# Patient Record
Sex: Female | Born: 1940 | Race: White | Hispanic: No | Marital: Married | Smoking: Former smoker
Health system: Southern US, Community
[De-identification: ages and names within clinical notes are randomized; demographics above are authoritative.]

## PROBLEM LIST (undated history)

## (undated) DIAGNOSIS — Z87442 Personal history of urinary calculi: Secondary | ICD-10-CM

## (undated) DIAGNOSIS — Z923 Personal history of irradiation: Secondary | ICD-10-CM

## (undated) DIAGNOSIS — K219 Gastro-esophageal reflux disease without esophagitis: Secondary | ICD-10-CM

## (undated) DIAGNOSIS — Z9889 Other specified postprocedural states: Secondary | ICD-10-CM

## (undated) DIAGNOSIS — M1990 Unspecified osteoarthritis, unspecified site: Secondary | ICD-10-CM

## (undated) DIAGNOSIS — D649 Anemia, unspecified: Secondary | ICD-10-CM

## (undated) DIAGNOSIS — E785 Hyperlipidemia, unspecified: Secondary | ICD-10-CM

## (undated) DIAGNOSIS — E039 Hypothyroidism, unspecified: Secondary | ICD-10-CM

## (undated) DIAGNOSIS — J189 Pneumonia, unspecified organism: Secondary | ICD-10-CM

## (undated) DIAGNOSIS — I10 Essential (primary) hypertension: Secondary | ICD-10-CM

## (undated) DIAGNOSIS — Z801 Family history of malignant neoplasm of trachea, bronchus and lung: Secondary | ICD-10-CM

## (undated) DIAGNOSIS — R112 Nausea with vomiting, unspecified: Secondary | ICD-10-CM

## (undated) DIAGNOSIS — I890 Lymphedema, not elsewhere classified: Secondary | ICD-10-CM

## (undated) HISTORY — DX: Family history of malignant neoplasm of trachea, bronchus and lung: Z80.1

## (undated) HISTORY — DX: Hypothyroidism, unspecified: E03.9

## (undated) HISTORY — DX: Hyperlipidemia, unspecified: E78.5

## (undated) HISTORY — DX: Essential (primary) hypertension: I10

## (undated) HISTORY — DX: Personal history of irradiation: Z92.3

## (undated) HISTORY — PX: HIP SURGERY: SHX245

## (undated) HISTORY — PX: OTHER SURGICAL HISTORY: SHX169

## (undated) HISTORY — PX: BACK SURGERY: SHX140

## (undated) HISTORY — DX: Gastro-esophageal reflux disease without esophagitis: K21.9

## (undated) HISTORY — PX: BREAST CYST ASPIRATION: SHX578

## (undated) HISTORY — PX: TOTAL HIP ARTHROPLASTY: SHX124

---

## 1995-12-01 HISTORY — PX: NECK SURGERY: SHX720

## 2001-11-30 HISTORY — PX: KNEE ARTHROSCOPY: SUR90

## 2005-11-30 HISTORY — PX: THYROIDECTOMY: SHX17

## 2021-06-04 ENCOUNTER — Other Ambulatory Visit: Payer: Self-pay

## 2021-06-04 ENCOUNTER — Ambulatory Visit (INDEPENDENT_AMBULATORY_CARE_PROVIDER_SITE_OTHER): Payer: Medicare Other | Admitting: Dermatology

## 2021-06-04 DIAGNOSIS — D1800 Hemangioma unspecified site: Secondary | ICD-10-CM

## 2021-06-04 DIAGNOSIS — L578 Other skin changes due to chronic exposure to nonionizing radiation: Secondary | ICD-10-CM | POA: Diagnosis not present

## 2021-06-04 DIAGNOSIS — Z85828 Personal history of other malignant neoplasm of skin: Secondary | ICD-10-CM | POA: Diagnosis not present

## 2021-06-04 DIAGNOSIS — L821 Other seborrheic keratosis: Secondary | ICD-10-CM

## 2021-06-04 DIAGNOSIS — Z1283 Encounter for screening for malignant neoplasm of skin: Secondary | ICD-10-CM | POA: Diagnosis not present

## 2021-06-04 DIAGNOSIS — C4491 Basal cell carcinoma of skin, unspecified: Secondary | ICD-10-CM

## 2021-06-04 DIAGNOSIS — L570 Actinic keratosis: Secondary | ICD-10-CM

## 2021-06-04 DIAGNOSIS — D485 Neoplasm of uncertain behavior of skin: Secondary | ICD-10-CM | POA: Diagnosis not present

## 2021-06-04 DIAGNOSIS — D229 Melanocytic nevi, unspecified: Secondary | ICD-10-CM

## 2021-06-04 DIAGNOSIS — C44519 Basal cell carcinoma of skin of other part of trunk: Secondary | ICD-10-CM | POA: Diagnosis not present

## 2021-06-04 DIAGNOSIS — L814 Other melanin hyperpigmentation: Secondary | ICD-10-CM

## 2021-06-04 HISTORY — DX: Basal cell carcinoma of skin, unspecified: C44.91

## 2021-06-04 HISTORY — DX: Actinic keratosis: L57.0

## 2021-06-04 NOTE — Progress Notes (Signed)
New Patient Visit  Subjective  Kristin Carlson is a 80 y.o. female who presents for the following: FBSE (Patient here for full body skin exam and skin cancer screening. Patient with hx of Anthem treated in [place]. ).  Will request records from previous dermatologists.   The following portions of the chart were reviewed this encounter and updated as appropriate:   Allergies  Meds  Problems  Med Hx  Surg Hx  Fam Hx      Review of Systems:  No other skin or systemic complaints except as noted in HPI or Assessment and Plan.  Objective  Well appearing patient in no apparent distress; mood and affect are within normal limits.  A full examination was performed including scalp, head, eyes, ears, nose, lips, neck, chest, axillae, abdomen, back, buttocks, bilateral upper extremities, bilateral lower extremities, hands, feet, fingers, toes, fingernails, and toenails. All findings within normal limits unless otherwise noted below.  Right Dorsal Forearm Erythematous thin papules/macules with gritty scale.   Right Suprapubic 1.3cm scaly pink plaque R/o BCC     Right Anteromedial Thigh 0.8cm pink papule R/o BCC       Left nasal ala 0.4cm excoriated pink papule    Assessment & Plan  AK (actinic keratosis) Right Dorsal Forearm  Prior to procedure, discussed risks of blister formation, small wound, skin dyspigmentation, or rare scar following cryotherapy. Recommend Vaseline ointment to treated areas while healing.  Actinic keratoses are precancerous spots that appear secondary to cumulative UV radiation exposure/sun exposure over time. They are chronic with expected duration over 1 year. A portion of actinic keratoses will progress to squamous cell carcinoma of the skin. It is not possible to reliably predict which spots will progress to skin cancer and so treatment is recommended to prevent development of skin cancer.  Recommend daily broad spectrum sunscreen SPF 30+ to  sun-exposed areas, reapply every 2 hours as needed.  Recommend staying in the shade or wearing long sleeves, sun glasses (UVA+UVB protection) and wide brim hats (4-inch brim around the entire circumference of the hat). Call for new or changing lesions.    Destruction of lesion - Right Dorsal Forearm  Destruction method: cryotherapy   Informed consent: discussed and consent obtained   Lesion destroyed using liquid nitrogen: Yes   Cryotherapy cycles:  2 Outcome: patient tolerated procedure well with no complications   Post-procedure details: wound care instructions given    Neoplasm of uncertain behavior of skin (3) Right Suprapubic  Skin / nail biopsy Type of biopsy: tangential   Informed consent: discussed and consent obtained   Timeout: patient name, date of birth, surgical site, and procedure verified   Patient was prepped and draped in usual sterile fashion: Area prepped with isopropyl alcohol. Anesthesia: the lesion was anesthetized in a standard fashion   Anesthetic:  1% lidocaine w/ epinephrine 1-100,000 buffered w/ 8.4% NaHCO3 Instrument used: flexible razor blade   Hemostasis achieved with: aluminum chloride   Outcome: patient tolerated procedure well   Post-procedure details: wound care instructions given   Additional details:  Mupirocin and a bandage applied  Specimen 1 - Surgical pathology Differential Diagnosis: R/o BCC  Check Margins: No 1.3cm scaly pink plaque   Right Anteromedial Thigh  Skin / nail biopsy Type of biopsy: tangential   Informed consent: discussed and consent obtained   Timeout: patient name, date of birth, surgical site, and procedure verified   Patient was prepped and draped in usual sterile fashion: Area prepped with isopropyl alcohol. Anesthesia:  the lesion was anesthetized in a standard fashion   Anesthetic:  1% lidocaine w/ epinephrine 1-100,000 buffered w/ 8.4% NaHCO3 Instrument used: flexible razor blade   Hemostasis achieved with:  aluminum chloride   Outcome: patient tolerated procedure well   Post-procedure details: wound care instructions given   Additional details:  Mupirocin and a bandage applied  Specimen 2 - Surgical pathology Differential Diagnosis: R/o BCC  Check Margins: No 0.8cm pink papule   Left nasal ala  Left Nasal Ala - DDX excoriated nevus vs angiofibroma > BCC Discussed bx, deferred today Vaseline daily and recheck when healed.   Lentigines - Scattered tan macules - Due to sun exposure - Benign-appering, observe - Recommend daily broad spectrum sunscreen SPF 30+ to sun-exposed areas, reapply every 2 hours as needed. - Call for any changes  Seborrheic Keratoses - Stuck-on, waxy, tan-brown papules and/or plaques  - Benign-appearing - Discussed benign etiology and prognosis. - Observe - Call for any changes  Melanocytic Nevi - Tan-brown and/or pink-flesh-colored symmetric macules and papules - Benign appearing on exam today - Observation - Call clinic for new or changing moles - Recommend daily use of broad spectrum spf 30+ sunscreen to sun-exposed areas.   Hemangiomas - Red papules - Discussed benign nature - Observe - Call for any changes  Actinic Damage - Chronic condition, secondary to cumulative UV/sun exposure - diffuse scaly erythematous macules with underlying dyspigmentation - Recommend daily broad spectrum sunscreen SPF 30+ to sun-exposed areas, reapply every 2 hours as needed.  - Staying in the shade or wearing long sleeves, sun glasses (UVA+UVB protection) and wide brim hats (4-inch brim around the entire circumference of the hat) are also recommended for sun protection.  - Call for new or changing lesions.  History of Basal Cell Carcinoma of the Skin - No evidence of recurrence today at right alar crease - Recommend regular full body skin exams - Recommend daily broad spectrum sunscreen SPF 30+ to sun-exposed areas, reapply every 2 hours as needed.  - Call if  any new or changing lesions are noted between office visits  Skin cancer screening performed today.  Return in about 6 months (around 12/05/2021) for TBSE.  Graciella Belton, RMA, am acting as scribe for Forest Gleason, MD .    Documentation: I have reviewed the above documentation for accuracy and completeness, and I agree with the above.  Forest Gleason, MD

## 2021-06-04 NOTE — Patient Instructions (Addendum)
Cryotherapy Aftercare  Wash gently with soap and water everyday.   Apply Vaseline and Band-Aid daily until healed.   Prior to procedure, discussed risks of blister formation, small wound, skin dyspigmentation, or rare scar following cryotherapy. Recommend Vaseline ointment to treated areas while healing.  Wound Care Instructions  Cleanse wound gently with soap and water once a day then pat dry with clean gauze. Apply a thing coat of Petrolatum (petroleum jelly, "Vaseline") over the wound (unless you have an allergy to this). We recommend that you use a new, sterile tube of Vaseline. Do not pick or remove scabs. Do not remove the yellow or white "healing tissue" from the base of the wound.  Cover the wound with fresh, clean, nonstick gauze and secure with paper tape. You may use Band-Aids in place of gauze and tape if the would is small enough, but would recommend trimming much of the tape off as there is often too much. Sometimes Band-Aids can irritate the skin.  You should call the office for your biopsy report after 1 week if you have not already been contacted.  If you experience any problems, such as abnormal amounts of bleeding, swelling, significant bruising, significant pain, or evidence of infection, please call the office immediately.  FOR ADULT SURGERY PATIENTS: If you need something for pain relief you may take 1 extra strength Tylenol (acetaminophen) AND 2 Ibuprofen (200mg  each) together every 4 hours as needed for pain. (do not take these if you are allergic to them or if you have a reason you should not take them.) Typically, you may only need pain medication for 1 to 3 days.   Melanoma ABCDEs  Melanoma is the most dangerous type of skin cancer, and is the leading cause of death from skin disease.  You are more likely to develop melanoma if you: Have light-colored skin, light-colored eyes, or red or blond hair Spend a lot of time in the sun Tan regularly, either outdoors or in a  tanning bed Have had blistering sunburns, especially during childhood Have a close family member who has had a melanoma Have atypical moles or large birthmarks  Early detection of melanoma is key since treatment is typically straightforward and cure rates are extremely high if we catch it early.   The first sign of melanoma is often a change in a mole or a new dark spot.  The ABCDE system is a way of remembering the signs of melanoma.  A for asymmetry:  The two halves do not match. B for border:  The edges of the growth are irregular. C for color:  A mixture of colors are present instead of an even brown color. D for diameter:  Melanomas are usually (but not always) greater than 80mm - the size of a pencil eraser. E for evolution:  The spot keeps changing in size, shape, and color.  Please check your skin once per month between visits. You can use a small mirror in front and a large mirror behind you to keep an eye on the back side or your body.   If you see any new or changing lesions before your next follow-up, please call to schedule a visit.  Please continue daily skin protection including broad spectrum sunscreen SPF 30+ to sun-exposed areas, reapplying every 2 hours as needed when you're outdoors.    If you have any questions or concerns for your doctor, please call our main line at [phone] and press option 4 to reach your doctor's medical  assistant. If no one answers, please leave a voicemail as directed and we will return your call as soon as possible. Messages left after 4 pm will be answered the following business day.   You may also send [place] a message via [place]. We typically respond to MyChart messages within 1-2 business days.  For prescription refills, please ask your pharmacy to contact our office. Our fax number is [phone].  If you have an urgent issue when the clinic is closed that cannot wait until the next business day, you can page your doctor at the number  below.    Please note that while we do our best to be available for urgent issues outside of office hours, we are not available 24/7.   If you have an urgent issue and are unable to reach [place], you may choose to seek medical care at your doctor's office, retail clinic, urgent care center, or emergency room.  If you have a medical emergency, please immediately call 911 or go to the emergency department.  Pager Numbers  - Dr. Nehemiah Massed: [phone]  - Dr. Laurence Ferrari: [phone]  - Dr. Nicole Kindred: [phone]  In the event of inclement weather, please call our main line at [phone] for an update on the status of any delays or closures.  Dermatology Medication Tips: Please keep the boxes that topical medications come in in order to help keep track of the instructions about where and how to use these. Pharmacies typically print the medication instructions only on the boxes and not directly on the medication tubes.   If your medication is too expensive, please contact our office at [phone] option 4 or send [place] a message through [place].   We are unable to tell what your co-pay for medications will be in advance as this is different depending on your insurance coverage. However, we may be able to find a substitute medication at lower cost or fill out paperwork to get insurance to cover a needed medication.   If a prior authorization is required to get your medication covered by your insurance company, please allow [place] 1-2 business days to complete this process.  Drug prices often vary depending on where the prescription is filled and some pharmacies may offer cheaper prices.  The website www.goodrx.com contains coupons for medications through different pharmacies. The prices here do not account for what the cost may be with help from insurance (it may be cheaper with your insurance), but the website can give you the price if you did not use any insurance.  - You can print the associated coupon  and take it with your prescription to the pharmacy.  - You may also stop by our office during regular business hours and pick up a GoodRx coupon card.  - If you need your prescription sent electronically to a different pharmacy, notify our office through [facility] or by phone at [phone] option 4.  Recommend taking Heliocare sun protection supplement daily in sunny weather for additional sun protection. For maximum protection on the sunniest days, you can take up to 2 capsules of regular Heliocare OR take 1 capsule of Heliocare Ultra. For prolonged exposure (such as a full day in the sun), you can repeat your dose of the supplement 4 hours after your first dose. Heliocare can be purchased at [facility] or at [email or web address].    Recommend Niacinamide or Nicotinamide 500mg  twice per day to lower risk of non-melanoma skin cancer by approximately 25%. This is usually available at  [employer].

## 2021-06-12 ENCOUNTER — Encounter: Payer: Self-pay | Admitting: Dermatology

## 2021-06-12 ENCOUNTER — Telehealth: Payer: Self-pay

## 2021-06-12 NOTE — Telephone Encounter (Signed)
-----   Message from Alfonso Patten, MD sent at 06/12/2021 11:09 AM EDT ----- 1. Skin , right suprapubic BASAL CELL CARCINOMA, SUPERFICIAL AND NODULAR PATTERNS, PERIPHERAL AND DEEP MARGINS INVOLVED --> ED&C  2. Skin , right anteromedial thigh LICHENOID ACTINIC KERATOSIS "inflamed precancer spot" --> LN2 in clinic  MAs please call. Thank you!

## 2021-06-16 ENCOUNTER — Telehealth: Payer: Self-pay

## 2021-06-16 NOTE — Telephone Encounter (Signed)
Discussed biopsy results with pt, appt scheduled for EDC of BCC, LN2 of precancer

## 2021-07-29 ENCOUNTER — Other Ambulatory Visit: Payer: Self-pay

## 2021-07-29 ENCOUNTER — Ambulatory Visit (INDEPENDENT_AMBULATORY_CARE_PROVIDER_SITE_OTHER): Payer: Medicare Other | Admitting: Dermatology

## 2021-07-29 ENCOUNTER — Encounter: Payer: Self-pay | Admitting: Dermatology

## 2021-07-29 DIAGNOSIS — L820 Inflamed seborrheic keratosis: Secondary | ICD-10-CM

## 2021-07-29 DIAGNOSIS — Z85828 Personal history of other malignant neoplasm of skin: Secondary | ICD-10-CM

## 2021-07-29 DIAGNOSIS — C44519 Basal cell carcinoma of skin of other part of trunk: Secondary | ICD-10-CM | POA: Diagnosis not present

## 2021-07-29 DIAGNOSIS — C44719 Basal cell carcinoma of skin of left lower limb, including hip: Secondary | ICD-10-CM

## 2021-07-29 DIAGNOSIS — D485 Neoplasm of uncertain behavior of skin: Secondary | ICD-10-CM

## 2021-07-29 DIAGNOSIS — C44712 Basal cell carcinoma of skin of right lower limb, including hip: Secondary | ICD-10-CM

## 2021-07-29 DIAGNOSIS — L570 Actinic keratosis: Secondary | ICD-10-CM | POA: Diagnosis not present

## 2021-07-29 DIAGNOSIS — D492 Neoplasm of unspecified behavior of bone, soft tissue, and skin: Secondary | ICD-10-CM

## 2021-07-29 HISTORY — DX: Personal history of other malignant neoplasm of skin: Z85.828

## 2021-07-29 NOTE — Progress Notes (Signed)
Follow-Up Visit   Subjective  Kristin Carlson is a 80 y.o. female who presents for the following: Skin Cancer (Biopsy proven BCC nodular pattern at the right suprapubic pt here for treatment ), Actinic Keratosis (Biopsy proven Ak at the right anteromedial thigh, pt here for treatment ), and Skin Problem (Pt c/o several irritated skin growths on her body growing and changing ).   The following portions of the chart were reviewed this encounter and updated as appropriate:   Allergies  Meds  Problems  Med Hx  Surg Hx  Fam Hx      Review of Systems:  No other skin or systemic complaints except as noted in HPI or Assessment and Plan.  Objective  Well appearing patient in no apparent distress; mood and affect are within normal limits.  A focused examination was performed including face,left thigh, left shoulder, right posterior neck. Relevant physical exam findings are noted in the Assessment and Plan.  right posterior neck Erythematous keratotic or waxy stuck-on papule or plaque.   left posterior shoulder, right anteromedial thigh (2) Erythematous thin papules/macules with gritty scale.   Left Thigh - Anterior 0.6 cm pink papule        left nasal ala 0.5 cm pink papule with focal depression      right suprapubic Pink pearly papule or plaque with arborizing vessels.    Assessment & Plan  Inflamed seborrheic keratosis right posterior neck  Prior to procedure, discussed risks of blister formation, small wound, skin dyspigmentation, or rare scar following cryotherapy. Recommend Vaseline ointment to treated areas while healing.   Destruction of lesion - right posterior neck Complexity: simple   Destruction method: cryotherapy   Informed consent: discussed and consent obtained   Timeout:  patient name, date of birth, surgical site, and procedure verified Lesion destroyed using liquid nitrogen: Yes   Region frozen until ice ball extended beyond lesion: Yes   Outcome:  patient tolerated procedure well with no complications   Post-procedure details: wound care instructions given    AK (actinic keratosis) (2) left posterior shoulder, right anteromedial thigh  Biopsy proven AK- right anteromedial thigh treated with LN2   AK-Broad-left posterior shoulder treated with LN2 Consider biopsy if not cleared with LN2 at follow-up  Destruction of lesion - left posterior shoulder, right anteromedial thigh Complexity: simple   Destruction method: cryotherapy   Informed consent: discussed and consent obtained   Timeout:  patient name, date of birth, surgical site, and procedure verified Lesion destroyed using liquid nitrogen: Yes   Region frozen until ice ball extended beyond lesion: Yes   Outcome: patient tolerated procedure well with no complications   Post-procedure details: wound care instructions given    Neoplasm of skin (2) Left Thigh - Anterior  Epidermal / dermal shaving  Lesion diameter (cm):  0.6 Informed consent: discussed and consent obtained   Timeout: patient name, date of birth, surgical site, and procedure verified   Procedure prep:  Patient was prepped and draped in usual sterile fashion Prep type:  Isopropyl alcohol Anesthesia: the lesion was anesthetized in a standard fashion   Anesthetic:  1% lidocaine w/ epinephrine 1-100,000 buffered w/ 8.4% NaHCO3 Hemostasis achieved with: pressure, aluminum chloride and electrodesiccation   Outcome: patient tolerated procedure well   Post-procedure details: sterile dressing applied and wound care instructions given   Dressing type: bandage and petrolatum    Destruction of lesion  Destruction method: electrodesiccation and curettage   Informed consent: discussed and consent obtained   Timeout:  patient name, date of birth, surgical site, and procedure verified Anesthesia: the lesion was anesthetized in a standard fashion   Anesthetic:  1% lidocaine w/ epinephrine 1-100,000 buffered w/ 8.4%  NaHCO3 Curettage performed in three different directions: Yes   Electrodesiccation performed over the curetted area: Yes   Curettage cycles:  3 Final wound size (cm):  0.9 Hemostasis achieved with:  electrodesiccation Outcome: patient tolerated procedure well with no complications   Post-procedure details: sterile dressing applied and wound care instructions given   Dressing type: petrolatum    Specimen 1 - Surgical pathology Differential Diagnosis: R/O BCC vs other   Check Margins: No  left nasal ala  Skin / nail biopsy Type of biopsy: tangential   Informed consent: discussed and consent obtained   Timeout: patient name, date of birth, surgical site, and procedure verified   Procedure prep:  Patient was prepped and draped in usual sterile fashion Prep type:  Isopropyl alcohol Anesthesia: the lesion was anesthetized in a standard fashion   Anesthetic:  1% lidocaine w/ epinephrine 1-100,000 buffered w/ 8.4% NaHCO3 Instrument used: flexible razor blade   Hemostasis achieved with: aluminum chloride   Outcome: patient tolerated procedure well   Post-procedure details: wound care instructions given   Additional details:  Petrolatum and a pressure bandage applied  Specimen 2 - Surgical pathology Differential Diagnosis: R/O BCC vs other  Small specimen in container  Check Margins: No  R/O BCC vs other   Basal cell carcinoma (BCC) of skin of right lower extremity including hip right suprapubic  Destruction of lesion  Destruction method: electrodesiccation and curettage   Informed consent: discussed and consent obtained   Timeout:  patient name, date of birth, surgical site, and procedure verified Anesthesia: the lesion was anesthetized in a standard fashion   Anesthetic:  1% lidocaine w/ epinephrine 1-100,000 buffered w/ 8.4% NaHCO3 Curettage performed in three different directions: Yes   Electrodesiccation performed over the curetted area: Yes   Curettage cycles:  3 Final  wound size (cm):  1.3 Hemostasis achieved with:  electrodesiccation Outcome: patient tolerated procedure well with no complications   Post-procedure details: sterile dressing applied and wound care instructions given   Dressing type: petrolatum    Return for TBSE January 26,2023.  I, Marye Round, CMA, am acting as scribe for Forest Gleason, MD .   Documentation: I have reviewed the above documentation for accuracy and completeness, and I agree with the above.  Forest Gleason, MD

## 2021-07-29 NOTE — Patient Instructions (Signed)

## 2021-07-30 ENCOUNTER — Encounter: Payer: Self-pay | Admitting: Dermatology

## 2021-08-05 ENCOUNTER — Telehealth: Payer: Self-pay

## 2021-08-05 NOTE — Telephone Encounter (Signed)
-----   Message from Alfonso Patten, MD sent at 07/31/2021 10:25 AM EDT ----- 1. Skin , left thigh - anterior BASAL CELL CARCINOMA, NODULAR PATTERN, BASE INVOLVED --> Already treated with ED&C  2. Skin , left nasal ala ATYPICAL BASALOID CELLS, SUSPICIOUS FOR BASAL CELL CARCINOMA, SEE DESCRIPTION --> clinically this is consistent with BCC and recommend treating as BCC. Would recommend referral for Mohs surgery. If she does not agree to Mohs surgery, would at a minimum do an [facility] though this is not as a good a treatment especially in this area and may give the cancer an opportunity to grow deeper and create a larger problem later  Dr. Jerilynn Mages called, no answer and voicemail full 07/31/2021.   MAs please call. Thank you!

## 2021-08-05 NOTE — Telephone Encounter (Signed)
Patient advised bx's showed BCC at left thigh, already treated and atypical basaloid cells at left nasal ala. Patient did not want referral for Moh's and was scheduled for [facility] in our office.Cherly Hensen

## 2021-08-26 ENCOUNTER — Other Ambulatory Visit: Payer: Self-pay

## 2021-08-26 ENCOUNTER — Encounter: Payer: Self-pay | Admitting: Emergency Medicine

## 2021-08-26 ENCOUNTER — Ambulatory Visit
Admission: EM | Admit: 2021-08-26 | Discharge: 2021-08-26 | Disposition: A | Discharge status: Home / Self Care | Payer: Medicare Other

## 2021-08-26 DIAGNOSIS — N950 Postmenopausal bleeding: Secondary | ICD-10-CM

## 2021-08-26 NOTE — ED Triage Notes (Signed)
Vaginal bleeding started yesterday.  "Gushed" when patient stood.  Color was dark brown.  Now very light in amount, but is bright area.

## 2021-08-26 NOTE — ED Provider Notes (Signed)
[name]    CSN: [id number] Arrival date & time: 08/26/21  1535      History   Chief Complaint Chief Complaint  Patient presents with   Vaginal Bleeding    HPI Kristin Carlson is a 80 y.o. female.  Patient is postmenopausal.  Has not had a period in years.  Work-up yesterday morning and felt a "gush" discovered she was passing dark, old blood from her vagina.  Over the course of yesterday, the color of the blood changed from dark, old blood to bright red blood.  Patient has had to change a menstrual pad several times today.  Called PCPs office but they were not able to see her today and directed her to come to urgent care.   Vaginal Bleeding Associated symptoms: no abdominal pain, no dysuria, no fever and no nausea    Past Medical History:  Diagnosis Date   Actinic keratosis 06/04/2021   right anteromedial thigh   Basal cell carcinoma 06/04/2021   right suprapubic   Basal cell carcinoma 07/29/2021   left nasal ala. atypical basaloid cells   History of basal cell carcinoma 07/29/2021   left thigh, EDC at time of bx    There are no problems to display for this patient.   Past Surgical History:  Procedure Laterality Date   BACK SURGERY     cataract Bilateral    HIP SURGERY     KNEE ARTHROSCOPY     NECK SURGERY      OB History   No obstetric history on file.      Home Medications    Prior to Admission medications   Medication Sig Start Date End Date Taking? Authorizing Provider  Cholecalciferol 50 MCG (2000 UT) CAPS Take by mouth.    [provider]  estrogen, conjugated,-medroxyprogesterone (PREMPRO) 0.3-1.5 MG tablet Take 1 tablet by mouth daily. 07/31/20 07/31/21  [provider]  levothyroxine (SYNTHROID) 137 MCG tablet Take by mouth. 01/15/21 01/15/22  [provider]  magnesium oxide (MAG-OX) 400 MG tablet Take by mouth.    [provider]  Omega-3 1000 MG CAPS Take by mouth.    [provider]   pantoprazole (PROTONIX) 20 MG tablet Take 1 tablet by mouth daily. 03/06/21   [provider]  propranolol (INDERAL) 20 MG tablet Take by mouth. 04/10/20   [provider]  rosuvastatin (CRESTOR) 5 MG tablet Take 1 tablet by mouth daily. 03/06/21 03/06/22  [provider]  temazepam (RESTORIL) 15 MG capsule TAKE 1 CAPSULE NIGHTLY AS NEEDED FOR SLEEP 01/31/21   [provider]    Family History Family History  Problem Relation Age of Onset   Cancer Father     Social History Social History   Tobacco Use   Smoking status: Never   Smokeless tobacco: Never  Vaping Use   Vaping Use: Never used  Substance Use Topics   Alcohol use: Yes   Drug use: Never     Allergies   Sulfa antibiotics, Codeine, Other, and Penicillins   Review of Systems Review of Systems  Constitutional:  Negative for appetite change, chills and fever.  Gastrointestinal:  Negative for abdominal pain, nausea and vomiting.  Genitourinary:  Positive for vaginal bleeding. Negative for dysuria.    Physical Exam Triage Vital Signs ED Triage Vitals  Enc Vitals Group     BP 08/26/21 1629 (!) 194/83     Pulse Rate 08/26/21 1629 67     Resp 08/26/21 1629 18  Temp 08/26/21 1629 98.8 F (37.1 C)     Temp Source 08/26/21 1629 Oral     SpO2 08/26/21 1629 96 %     Weight --      Height --      Head Circumference --      Peak Flow --      Pain Score 08/26/21 1635 2     Pain Loc --      Pain Edu? --      Excl. in [place]? --    No data found.  Updated Vital Signs BP (!) 194/83 (BP Location: Left Arm)   Pulse 67   Temp 98.8 F (37.1 C) (Oral)   Resp 18   SpO2 96%   Visual Acuity Right Eye Distance:   Left Eye Distance:   Bilateral Distance:    Right Eye Near:   Left Eye Near:    Bilateral Near:     Physical Exam Constitutional:      Appearance: Normal appearance.  Cardiovascular:     Rate and Rhythm: Normal rate and regular rhythm.  Pulmonary:     Effort: Pulmonary  effort is normal.     Breath sounds: Normal breath sounds.  Abdominal:     General: Abdomen is flat. Bowel sounds are normal.     Palpations: Abdomen is soft.     Tenderness: There is no abdominal tenderness.  Genitourinary:    Labia:        Right: No lesion.        Left: No lesion.      Vagina: No signs of injury. Bleeding present. No erythema or prolapsed vaginal walls.     Cervix: Cervical bleeding present.     Uterus: Not tender.      Comments: Able to palpate uterus but unable to determine size or position sufficiently and unable to palpate bilateral ovaries due to abdominal size.  Dark blood visibly passing from cervical os.  No vaginal or cervical injury. Neurological:     Mental Status: She is alert.     UC Treatments / Results  Labs (all labs ordered are listed, but only abnormal results are displayed) Labs Reviewed - No data to display  EKG   Radiology No results found.  Procedures Procedures (including critical care time)  Medications Ordered in UC Medications - No data to display  Initial Impression / Assessment and Plan / UC Course  I have reviewed the triage vital signs and the nursing notes.  Pertinent labs & imaging results that were available during my care of the patient were reviewed by me and considered in my medical decision making (see chart for details).  Abnormal vaginal bleeding in postmenopausal woman.  Patient will need transvaginal ultrasound.  PCP can order or he may prefer she see gynecology first.   Final Clinical Impressions(s) / UC Diagnoses   Final diagnoses:  Abnormal vaginal bleeding in postmenopausal patient   Discharge Instructions   None    ED Prescriptions   None    PDMP not reviewed this encounter.   Carvel Getting, NP 08/26/21 1926

## 2021-08-27 ENCOUNTER — Ambulatory Visit: Payer: Medicare Other | Admitting: Dermatology

## 2021-09-03 ENCOUNTER — Telehealth: Payer: Self-pay

## 2021-09-03 NOTE — Telephone Encounter (Signed)
Returned patient's call on 08/29/21 she no longer wants to have the [facility] on the L nasal ala treated here in the office with ED&C. She prefers to have MOHS procedure instead.

## 2021-09-03 NOTE — Telephone Encounter (Signed)
ERROR

## 2021-09-18 ENCOUNTER — Telehealth: Payer: Self-pay | Admitting: *Deleted

## 2021-09-18 ENCOUNTER — Encounter: Payer: Self-pay | Admitting: Gynecologic Oncology

## 2021-09-18 NOTE — Telephone Encounter (Signed)
Spoke with the patient and scheduled a new patient appt for 10/24 with Dr Berline Lopes at 10:30 am. Patient given the address and phone number for the clinic.

## 2021-09-22 ENCOUNTER — Telehealth: Payer: Self-pay

## 2021-09-22 ENCOUNTER — Other Ambulatory Visit: Payer: Self-pay

## 2021-09-22 ENCOUNTER — Encounter: Payer: Self-pay | Admitting: Gynecologic Oncology

## 2021-09-22 ENCOUNTER — Inpatient Hospital Stay: Payer: Medicare Other

## 2021-09-22 ENCOUNTER — Inpatient Hospital Stay (HOSPITAL_BASED_OUTPATIENT_CLINIC_OR_DEPARTMENT_OTHER): Payer: Medicare Other | Admitting: Gynecologic Oncology

## 2021-09-22 ENCOUNTER — Inpatient Hospital Stay: Payer: Medicare Other | Attending: Gynecologic Oncology | Admitting: Gynecologic Oncology

## 2021-09-22 VITALS — BP 171/80 | HR 66 | Temp 98.4°F | Resp 16 | Ht 63.0 in | Wt 179.0 lb

## 2021-09-22 DIAGNOSIS — E785 Hyperlipidemia, unspecified: Secondary | ICD-10-CM | POA: Diagnosis not present

## 2021-09-22 DIAGNOSIS — E039 Hypothyroidism, unspecified: Secondary | ICD-10-CM | POA: Insufficient documentation

## 2021-09-22 DIAGNOSIS — C541 Malignant neoplasm of endometrium: Secondary | ICD-10-CM

## 2021-09-22 DIAGNOSIS — Z79899 Other long term (current) drug therapy: Secondary | ICD-10-CM | POA: Diagnosis not present

## 2021-09-22 DIAGNOSIS — I10 Essential (primary) hypertension: Secondary | ICD-10-CM | POA: Diagnosis not present

## 2021-09-22 DIAGNOSIS — N3946 Mixed incontinence: Secondary | ICD-10-CM | POA: Insufficient documentation

## 2021-09-22 LAB — BASIC METABOLIC PANEL - CANCER CENTER ONLY
Anion gap: 7 (ref 5–15)
BUN: 19 mg/dL (ref 8–23)
CO2: 28 mmol/L (ref 22–32)
Calcium: 9.5 mg/dL (ref 8.9–10.3)
Chloride: 105 mmol/L (ref 98–111)
Creatinine: 1.01 mg/dL — ABNORMAL HIGH (ref 0.44–1.00)
GFR, Estimated: 57 mL/min — ABNORMAL LOW (ref >60)
Glucose, Bld: 99 mg/dL (ref 70–99)
Potassium: 4.7 mmol/L (ref 3.5–5.1)
Sodium: 140 mmol/L (ref 135–145)

## 2021-09-22 MED ORDER — HYDROMORPHONE HCL 2 MG PO TABS: 2.0000 mg | ORAL_TABLET | Freq: Four times a day (QID) | ORAL | 0 refills | Status: DC | PRN

## 2021-09-22 MED ORDER — SENNOSIDES-DOCUSATE SODIUM 8.6-50 MG PO TABS: 2.0000 | ORAL_TABLET | Freq: Every day | ORAL | 0 refills | Status: DC

## 2021-09-22 MED ORDER — PROMETHAZINE HCL 12.5 MG PO TABS
12.5000 mg | ORAL_TABLET | Freq: Four times a day (QID) | ORAL | 0 refills | Status: DC | PRN
Start: 2021-09-22 — End: 2021-10-28

## 2021-09-22 NOTE — Telephone Encounter (Signed)
Reviewed BMP results with Ms. Barga. Per Joylene John, NP creatinine slightly elevated at 1.01. Patient states "its been a little high before." Instructed patient to stay hydrated especially with upcoming CT scan. BMP results routed to Marian Sorrow, [place]. Patient verbalized understanding. Instructed to call with any questions or concerns.

## 2021-09-22 NOTE — H&P (View-Only) (Signed)
GYNECOLOGIC ONCOLOGY NEW PATIENT CONSULTATION   Patient Name: Kristin Carlson  Patient Age: 80 y.o. Date of Service: 09/22/21 Referring Provider: Dr. 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   We reviewed the nature of endometrial cancer and its recommended surgical staging, including total hysterectomy, bilateral salpingo-oophorectomy, and lymph node assessment. The patient is a suitable candidate for staging via a minimally invasive approach to surgery.  We reviewed that robotic assistance would be used to complete the surgery.   We discussed that most endometrial cancer is detected early, however, we reviewed that adjuvant therapy will likely be recommended based on the patient's biopsy, however, we will defer to final pathology results.    Given her high risk histology, I recommend CT scan preoperatively to rule out metastatic disease.  We reviewed the sentinel lymph node technique. Risks and benefits of sentinel lymph node biopsy was reviewed. We reviewed the technique and ICG dye. The patient DOES NOT have an iodine allergy or known liver dysfunction. We reviewed the false negative rate (0.4%), and that 3% of patients with metastatic disease will not have it detected by SLN biopsy in endometrial cancer. A low risk of allergic reaction to the dye, <0.2% for ICG, has been reported. We also discussed that in the case of failed mapping, which occurs 40% of the time, a bilateral or unilateral lymphadenectomy will be performed at the surgeon's discretion.   Potential benefits of sentinel nodes including a higher detection rate for metastasis due to ultrastaging and potential reduction in operative morbidity. However, there remains uncertainty as to the role for treatment of micrometastatic disease. Further, the benefit of operative morbidity  associated with the SLN technique in endometrial cancer is not yet completely known. In other patient populations (e.g. the cervical cancer population) there has been observed reductions in morbidity with SLN biopsy compared to pelvic lymphadenectomy. Lymphedema, nerve dysfunction and lymphocysts are all potential risks with the SLN technique as with complete lymphadenectomy. Additional risks to the patient include the risk of damage to an internal organ while operating in an altered view (e.g. the black and white image of the robotic fluorescence imaging mode).   We discussed plan for a robotic assisted hysterectomy, bilateral salpingo-oophorectomy, sentinel lymph node evaluation, possible lymph node dissection, possible tumor debulking, possible laparotomy. The risks of surgery were discussed in detail and she understands these to include infection; wound separation; hernia; vaginal cuff separation, injury to adjacent organs such as bowel, bladder, blood vessels, ureters and nerves; bleeding which may require blood transfusion; anesthesia risk; thromboembolic events; possible death; unforeseen complications; possible need for re-exploration; medical complications such as heart attack, stroke, pleural effusion and pneumonia; and, if full lymphadenectomy is performed the risk of lymphedema and lymphocyst. The patient will receive DVT and antibiotic prophylaxis as indicated. She voiced a clear understanding. She had the opportunity to ask questions. Perioperative instructions were reviewed with her. Prescriptions for post-op medications were sent to her pharmacy of choice.  We discussed surgery on 11/3. 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  She stopped Prempro 3 weeks ago and remains asymptomatic.   Patient has significant issues and allergies to codeine (nausea).  She has had Demerol and morphine which also caused nausea but  better tolerated.  She has had recent good results with Dilaudid, especially when  she takes Phenergan before using the narcotic.  A copy of this note was sent to the patient's referring provider.   85 minutes of total time was spent for this patient encounter, including preparation, face-to-face counseling with the patient and coordination of care, and documentation of the encounter.   Jeral Pinch, MD  Division of Gynecologic Oncology  Department of Obstetrics and Gynecology  [facility]  ___________________________________________  Chief Complaint: Chief Complaint  Patient presents with   Endometrial cancer [facility])    History of Present Illness:  Kristin Carlson is a 80 y.o. y.o. female who is seen in consultation at the request of Dr. Willis Modena for an evaluation of high-grade endometrial cancer.  Patient reports being on hormone replacement therapy (Pempro) for about 20 years.  On 9/26, she had postmenopausal bleeding for the first time.  She felt a gush when she got out of bed and bled through the clothes she was wearing as well as her sheets.  She had bleeding that she describes as being similar to a normal menses for her in the past for approximately 7 or 8 days.  Since then, she has had intermittent bleeding with no bleeding at all on some days.  She endorses some mild cramping during this time.  She was evaluated in the [facility] urgent care on 9/27 after being referred there by her PCP.  She ultimately was then referred to GYN and underwent exam with endometrial biopsy.  Biopsy revealed high-grade endometrial cancer.  She reports a good appetite without nausea or emesis.  She has constipation at baseline and known severe diverticulosis.  She describes her stools as looking like ribbons.  She takes fiber which helps her bowel function.  She had previously been on probiotics in the past.  She has both stress and urge urinary incontinence.  She is seen  urogynecology and is about to be started on a new medication for urge urinary incontinence.  She has tried multiple medications without long-term improvement in the past.  Past medical history is notable for hypertension and tremor, for which she is on propanolol.  She has hypothyroidism as well as hyperlipidemia.  She denies any shortness of breath or chest pain when she is up and mobile.  She has not had any abdominal surgeries  She lives in [place] with her husband.  She denies any tobacco or alcohol use.  She is retired.  She enjoys playing the trombone.  PAST MEDICAL HISTORY:  Past Medical History:  Diagnosis Date   Actinic keratosis 06/04/2021   right anteromedial thigh   Basal cell carcinoma 06/04/2021   right suprapubic   Basal cell carcinoma 07/29/2021   left nasal ala. atypical basaloid cells   History of basal cell carcinoma 07/29/2021   left thigh, EDC at time of bx     PAST SURGICAL HISTORY:  Past Surgical History:  Procedure Laterality Date   BACK SURGERY     cataract Bilateral    HIP SURGERY     KNEE ARTHROSCOPY Left 2003   NECK SURGERY  1997   Fusion C3, C4, C5   THYROIDECTOMY  2007   TOTAL HIP ARTHROPLASTY Bilateral    Left Hip -2015, Right Hip 208    OB/GYN HISTORY:  OB History  No obstetric history on file.    No LMP recorded. Patient is postmenopausal.  Age at menarche: 11  Age at menopause: 33 Hx of HRT: Yes, see HPI Hx of STDs: Denies Last pap: 2020 History of  abnormal pap smears: Denies  SCREENING STUDIES:  Last mammogram: 2020  Last colonoscopy: 2018  MEDICATIONS: Outpatient Encounter Medications as of 09/22/2021  Medication Sig   Cholecalciferol 50 MCG (2000 UT) CAPS Take by mouth.   levothyroxine (SYNTHROID) 137 MCG tablet Take by mouth.   magnesium oxide (MAG-OX) 400 MG tablet Take by mouth.   Multiple Vitamins-Minerals (MULTIVITAMIN WOMEN 50+ PO) Take by mouth.   Omega-3 1000 MG CAPS Take 3 capsules by mouth. 3000mg  daily    propranolol (INDERAL) 20 MG tablet Take by mouth 2 (two) times daily.   rosuvastatin (CRESTOR) 5 MG tablet Take 1 tablet by mouth daily.   temazepam (RESTORIL) 15 MG capsule TAKE 1 CAPSULE NIGHTLY AS NEEDED FOR SLEEP   clindamycin (CLEOCIN) 150 MG capsule Take 150 mg by mouth. As needed for dentist (Patient not taking: Reported on 09/18/2021)   estrogen, conjugated,-medroxyprogesterone (PREMPRO) 0.3-1.5 MG tablet Take 1 tablet by mouth daily.   pantoprazole (PROTONIX) 20 MG tablet Take 1 tablet by mouth daily. (Patient not taking: Reported on 09/18/2021)   [DISCONTINUED] propranolol (INDERAL) 40 MG tablet Take 40 mg by mouth 2 (two) times daily.   No facility-administered encounter medications on file as of 09/22/2021.    ALLERGIES:  Allergies  Allergen Reactions   Lactose    Sulfa Antibiotics    Codeine Nausea Only   Other Rash   Penicillins Rash     FAMILY HISTORY:  Family History  Problem Relation Age of Onset   Cancer Father        Lung Cancer   Skin cancer Maternal Grandmother    Colon cancer Neg Hx    Breast cancer Neg Hx    Ovarian cancer Neg Hx    Endometrial cancer Neg Hx    Pancreatic cancer Neg Hx    Prostate cancer Neg Hx      SOCIAL HISTORY:  Social Connections: Not on file    REVIEW OF SYSTEMS:  + vaginal bleeding Denies appetite changes, fevers, chills, fatigue, unexplained weight changes. Denies hearing loss, neck lumps or masses, mouth sores, ringing in ears or voice changes. Denies cough or wheezing.  Denies shortness of breath. Denies chest pain or palpitations. Denies leg swelling. Denies abdominal distention, pain, blood in stools, constipation, diarrhea, nausea, vomiting, or early satiety. Denies pain with intercourse, dysuria, frequency, hematuria or incontinence. Denies hot flashes, pelvic pain.   Denies joint pain, back pain or muscle pain/cramps. Denies itching, rash, or wounds. Denies dizziness, headaches, numbness or seizures. Denies  swollen lymph nodes or glands, denies easy bruising or bleeding. Denies anxiety, depression, confusion, or decreased concentration.  Physical Exam:  Vital Signs for this encounter:  Blood pressure (!) 171/80, pulse 66, temperature 98.4 F (36.9 C), temperature source Oral, resp. rate 16, height 5\' 3"  (1.6 m), weight 179 lb (81.2 kg), SpO2 97 %. Body mass index is 31.71 kg/m. General: Alert, oriented, no acute distress.  HEENT: Normocephalic, atraumatic. Sclera anicteric.  Chest: Clear to auscultation bilaterally. No wheezes, rhonchi, or rales. Cardiovascular: Regular rate and rhythm, no murmurs, rubs, or gallops.  Abdomen: Obese. Normoactive bowel sounds. Soft, nondistended, nontender to palpation. No masses or hepatosplenomegaly appreciated. No palpable fluid wave.  Extremities: Grossly normal range of motion. Warm, well perfused. No edema bilaterally.  Skin: No rashes or lesions.  Lymphatics: No cervical, supraclavicular, or inguinal adenopathy.  GU:  Normal external female genitalia. No lesions. No discharge or bleeding.             Bladder/urethra:  No lesions or masses, well supported bladder             Vagina: Mildly atrophic vaginal mucosa.  No lesions or masses.             Cervix: Normal appearing, no lesions.  Cervix rather flush with the vagina.  Small amount of bleeding noted at the os.  No evidence of cervical involvement grossly.             Uterus:  Small, mobile, no parametrial involvement or nodularity.             Adnexa: No masses appreciated.  LABORATORY AND RADIOLOGIC DATA:  Outside medical records were reviewed to synthesize the above history, along with the history and physical obtained during the visit.   No results found for: WBC, HGB, HCT, PLT, GLUCOSE, CHOL, TRIG, HDL, LDLDIRECT, LDLCALC, ALT, AST, NA, K, CL, CREATININE, BUN, CO2, TSH, PSA, INR, GLUF, HGBA1C, MICROALBUR  Endometrial biopsy on 09/11/2021: Poorly differentiated carcinoma.  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

## 2021-09-22 NOTE — Progress Notes (Signed)
GYNECOLOGIC ONCOLOGY NEW PATIENT CONSULTATION   Patient Name: Kristin Carlson  Patient Age: 80 y.o. Date of Service: 09/22/21 Referring Provider: Dr. 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   We reviewed the nature of endometrial cancer and its recommended surgical staging, including total hysterectomy, bilateral salpingo-oophorectomy, and lymph node assessment. The patient is a suitable candidate for staging via a minimally invasive approach to surgery.  We reviewed that robotic assistance would be used to complete the surgery.   We discussed that most endometrial cancer is detected early, however, we reviewed that adjuvant therapy will likely be recommended based on the patient's biopsy, however, we will defer to final pathology results.    Given her high risk histology, I recommend CT scan preoperatively to rule out metastatic disease.  We reviewed the sentinel lymph node technique. Risks and benefits of sentinel lymph node biopsy was reviewed. We reviewed the technique and ICG dye. The patient DOES NOT have an iodine allergy or known liver dysfunction. We reviewed the false negative rate (0.4%), and that 3% of patients with metastatic disease will not have it detected by SLN biopsy in endometrial cancer. A low risk of allergic reaction to the dye, <0.2% for ICG, has been reported. We also discussed that in the case of failed mapping, which occurs 40% of the time, a bilateral or unilateral lymphadenectomy will be performed at the surgeon's discretion.   Potential benefits of sentinel nodes including a higher detection rate for metastasis due to ultrastaging and potential reduction in operative morbidity. However, there remains uncertainty as to the role for treatment of micrometastatic disease. Further, the benefit of operative morbidity  associated with the SLN technique in endometrial cancer is not yet completely known. In other patient populations (e.g. the cervical cancer population) there has been observed reductions in morbidity with SLN biopsy compared to pelvic lymphadenectomy. Lymphedema, nerve dysfunction and lymphocysts are all potential risks with the SLN technique as with complete lymphadenectomy. Additional risks to the patient include the risk of damage to an internal organ while operating in an altered view (e.g. the black and white image of the robotic fluorescence imaging mode).   We discussed plan for a robotic assisted hysterectomy, bilateral salpingo-oophorectomy, sentinel lymph node evaluation, possible lymph node dissection, possible tumor debulking, possible laparotomy. The risks of surgery were discussed in detail and she understands these to include infection; wound separation; hernia; vaginal cuff separation, injury to adjacent organs such as bowel, bladder, blood vessels, ureters and nerves; bleeding which may require blood transfusion; anesthesia risk; thromboembolic events; possible death; unforeseen complications; possible need for re-exploration; medical complications such as heart attack, stroke, pleural effusion and pneumonia; and, if full lymphadenectomy is performed the risk of lymphedema and lymphocyst. The patient will receive DVT and antibiotic prophylaxis as indicated. She voiced a clear understanding. She had the opportunity to ask questions. Perioperative instructions were reviewed with her. Prescriptions for post-op medications were sent to her pharmacy of choice.  We discussed surgery on 11/3. 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  She stopped Prempro 3 weeks ago and remains asymptomatic.   Patient has significant issues and allergies to codeine (nausea).  She has had Demerol and morphine which also caused nausea but  better tolerated.  She has had recent good results with Dilaudid, especially when  she takes Phenergan before using the narcotic.  A copy of this note was sent to the patient's referring provider.   85 minutes of total time was spent for this patient encounter, including preparation, face-to-face counseling with the patient and coordination of care, and documentation of the encounter.   Kristin Pinch, MD  Division of Gynecologic Oncology  Department of Obstetrics and Gynecology  [facility]  ___________________________________________  Chief Complaint: Chief Complaint  Patient presents with   Endometrial cancer [facility])    History of Present Illness:  Kristin Carlson is a 80 y.o. y.o. female who is seen in consultation at the request of Dr. Willis Carlson for an evaluation of high-grade endometrial cancer.  Patient reports being on hormone replacement therapy (Pempro) for about 20 years.  On 9/26, she had postmenopausal bleeding for the first time.  She felt a gush when she got out of bed and bled through the clothes she was wearing as well as her sheets.  She had bleeding that she describes as being similar to a normal menses for her in the past for approximately 7 or 8 days.  Since then, she has had intermittent bleeding with no bleeding at all on some days.  She endorses some mild cramping during this time.  She was evaluated in the [facility] urgent care on 9/27 after being referred there by her PCP.  She ultimately was then referred to GYN and underwent exam with endometrial biopsy.  Biopsy revealed high-grade endometrial cancer.  She reports a good appetite without nausea or emesis.  She has constipation at baseline and known severe diverticulosis.  She describes her stools as looking like ribbons.  She takes fiber which helps her bowel function.  She had previously been on probiotics in the past.  She has both stress and urge urinary incontinence.  She is seen  urogynecology and is about to be started on a new medication for urge urinary incontinence.  She has tried multiple medications without long-term improvement in the past.  Past medical history is notable for hypertension and tremor, for which she is on propanolol.  She has hypothyroidism as well as hyperlipidemia.  She denies any shortness of breath or chest pain when she is up and mobile.  She has not had any abdominal surgeries  She lives in [place] with her husband.  She denies any tobacco or alcohol use.  She is retired.  She enjoys playing the trombone.  PAST MEDICAL HISTORY:  Past Medical History:  Diagnosis Date   Actinic keratosis 06/04/2021   right anteromedial thigh   Basal cell carcinoma 06/04/2021   right suprapubic   Basal cell carcinoma 07/29/2021   left nasal ala. atypical basaloid cells   History of basal cell carcinoma 07/29/2021   left thigh, EDC at time of bx     PAST SURGICAL HISTORY:  Past Surgical History:  Procedure Laterality Date   BACK SURGERY     cataract Bilateral    HIP SURGERY     KNEE ARTHROSCOPY Left 2003   NECK SURGERY  1997   Fusion C3, C4, C5   THYROIDECTOMY  2007   TOTAL HIP ARTHROPLASTY Bilateral    Left Hip -2015, Right Hip 208    OB/GYN HISTORY:  OB History  No obstetric history on file.    No LMP recorded. Patient is postmenopausal.  Age at menarche: 19  Age at menopause: 15 Hx of HRT: Yes, see HPI Hx of STDs: Denies Last pap: 2020 History of  abnormal pap smears: Denies  SCREENING STUDIES:  Last mammogram: 2020  Last colonoscopy: 2018  MEDICATIONS: Outpatient Encounter Medications as of 09/22/2021  Medication Sig   Cholecalciferol 50 MCG (2000 UT) CAPS Take by mouth.   levothyroxine (SYNTHROID) 137 MCG tablet Take by mouth.   magnesium oxide (MAG-OX) 400 MG tablet Take by mouth.   Multiple Vitamins-Minerals (MULTIVITAMIN WOMEN 50+ PO) Take by mouth.   Omega-3 1000 MG CAPS Take 3 capsules by mouth. 3000mg  daily    propranolol (INDERAL) 20 MG tablet Take by mouth 2 (two) times daily.   rosuvastatin (CRESTOR) 5 MG tablet Take 1 tablet by mouth daily.   temazepam (RESTORIL) 15 MG capsule TAKE 1 CAPSULE NIGHTLY AS NEEDED FOR SLEEP   clindamycin (CLEOCIN) 150 MG capsule Take 150 mg by mouth. As needed for dentist (Patient not taking: Reported on 09/18/2021)   estrogen, conjugated,-medroxyprogesterone (PREMPRO) 0.3-1.5 MG tablet Take 1 tablet by mouth daily.   pantoprazole (PROTONIX) 20 MG tablet Take 1 tablet by mouth daily. (Patient not taking: Reported on 09/18/2021)   [DISCONTINUED] propranolol (INDERAL) 40 MG tablet Take 40 mg by mouth 2 (two) times daily.   No facility-administered encounter medications on file as of 09/22/2021.    ALLERGIES:  Allergies  Allergen Reactions   Lactose    Sulfa Antibiotics    Codeine Nausea Only   Other Rash   Penicillins Rash     FAMILY HISTORY:  Family History  Problem Relation Age of Onset   Cancer Father        Lung Cancer   Skin cancer Maternal Grandmother    Colon cancer Neg Hx    Breast cancer Neg Hx    Ovarian cancer Neg Hx    Endometrial cancer Neg Hx    Pancreatic cancer Neg Hx    Prostate cancer Neg Hx      SOCIAL HISTORY:  Social Connections: Not on file    REVIEW OF SYSTEMS:  + vaginal bleeding Denies appetite changes, fevers, chills, fatigue, unexplained weight changes. Denies hearing loss, neck lumps or masses, mouth sores, ringing in ears or voice changes. Denies cough or wheezing.  Denies shortness of breath. Denies chest pain or palpitations. Denies leg swelling. Denies abdominal distention, pain, blood in stools, constipation, diarrhea, nausea, vomiting, or early satiety. Denies pain with intercourse, dysuria, frequency, hematuria or incontinence. Denies hot flashes, pelvic pain.   Denies joint pain, back pain or muscle pain/cramps. Denies itching, rash, or wounds. Denies dizziness, headaches, numbness or seizures. Denies  swollen lymph nodes or glands, denies easy bruising or bleeding. Denies anxiety, depression, confusion, or decreased concentration.  Physical Exam:  Vital Signs for this encounter:  Blood pressure (!) 171/80, pulse 66, temperature 98.4 F (36.9 C), temperature source Oral, resp. rate 16, height 5\' 3"  (1.6 m), weight 179 lb (81.2 kg), SpO2 97 %. Body mass index is 31.71 kg/m. General: Alert, oriented, no acute distress.  HEENT: Normocephalic, atraumatic. Sclera anicteric.  Chest: Clear to auscultation bilaterally. No wheezes, rhonchi, or rales. Cardiovascular: Regular rate and rhythm, no murmurs, rubs, or gallops.  Abdomen: Obese. Normoactive bowel sounds. Soft, nondistended, nontender to palpation. No masses or hepatosplenomegaly appreciated. No palpable fluid wave.  Extremities: Grossly normal range of motion. Warm, well perfused. No edema bilaterally.  Skin: No rashes or lesions.  Lymphatics: No cervical, supraclavicular, or inguinal adenopathy.  GU:  Normal external female genitalia. No lesions. No discharge or bleeding.             Bladder/urethra:  No lesions or masses, well supported bladder             Vagina: Mildly atrophic vaginal mucosa.  No lesions or masses.             Cervix: Normal appearing, no lesions.  Cervix rather flush with the vagina.  Small amount of bleeding noted at the os.  No evidence of cervical involvement grossly.             Uterus:  Small, mobile, no parametrial involvement or nodularity.             Adnexa: No masses appreciated.  LABORATORY AND RADIOLOGIC DATA:  Outside medical records were reviewed to synthesize the above history, along with the history and physical obtained during the visit.   No results found for: WBC, HGB, HCT, PLT, GLUCOSE, CHOL, TRIG, HDL, LDLDIRECT, LDLCALC, ALT, AST, NA, K, CL, CREATININE, BUN, CO2, TSH, PSA, INR, GLUF, HGBA1C, MICROALBUR  Endometrial biopsy on 09/11/2021: Poorly differentiated carcinoma.  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

## 2021-09-22 NOTE — Patient Instructions (Signed)
Preparing for your Surgery  Plan for surgery on October 14, 2021 with Dr. Jeral Pinch at [place] will be scheduled for robotic assisted total laparoscopic hysterectomy (removal of the uterus and cervix), bilateral salpingo-oophorectomy (removal of both ovaries and fallopian tubes), sentinel lymph node biopsy, possible lymph node dissection and other biopsies, possible laparotomy (larger incision if needed).   Pre-operative Testing -You will receive a phone call from presurgical testing at [facility] to arrange for a pre-operative appointment and lab work.  -Bring your insurance card, copy of an advanced directive if applicable, medication list  -At that visit, you will be asked to sign a consent for a possible blood transfusion in case a transfusion becomes necessary during surgery.  The need for a blood transfusion is rare but having consent is a necessary part of your care.     -You should not be taking blood thinners or aspirin at least ten days prior to surgery unless instructed by your surgeon.  -Do not take supplements such as fish oil (omega 3), red yeast rice, turmeric before your surgery. You want to avoid medications with aspirin in them including headache powders such as BC or Goody's), Excedrin migraine.  Day Before Surgery at [place] will be asked to take in a light diet the day before surgery. You will be advised you can have clear liquids up until 3 hours before your surgery.    Eat a light diet the day before surgery.  Examples including soups, broths, toast, yogurt, mashed potatoes.  AVOID GAS PRODUCING FOODS. Things to avoid include carbonated beverages (fizzy beverages, sodas), raw fruits and raw vegetables (uncooked), or beans.   If your bowels are filled with gas, your surgeon will have difficulty visualizing your pelvic organs which increases your surgical risks.  Your role in recovery Your role is to become active as soon as directed  by your doctor, while still giving yourself time to heal.  Rest when you feel tired. You will be asked to do the following in order to speed your recovery:  - Cough and breathe deeply. This helps to clear and expand your lungs and can prevent pneumonia after surgery.  - [place]. Do mild physical activity. Walking or moving your legs help your circulation and body functions return to normal. Do not try to get up or walk alone the first time after surgery.   -If you develop swelling on one leg or the other, pain in the back of your leg, redness/warmth in one of your legs, please call the office or go to the Emergency Room to have a doppler to rule out a blood clot. For shortness of breath, chest pain-seek care in the Emergency Room as soon as possible. - Actively manage your pain. Managing your pain lets you move in comfort. We will ask you to rate your pain on a scale of zero to 10. It is your responsibility to tell your doctor or nurse where and how much you hurt so your pain can be treated.  Special Considerations -If you are diabetic, you may be placed on insulin after surgery to have closer control over your blood sugars to promote healing and recovery.  This does not mean that you will be discharged on insulin.  If applicable, your oral antidiabetics will be resumed when you are tolerating a solid diet.  -Your final pathology results from surgery should be available around one week after surgery and the results will  be relayed to you when available.  -Dr. Lahoma Crocker is the surgeon that assists your GYN Oncologist with surgery.  If you end up staying the night, the next day after your surgery you will either see Dr. Denman George, Dr. Berline Lopes, or Dr. Lahoma Crocker.  -FMLA forms can be faxed to [phone] and please allow 5-7 business days for completion.  Pain Management After Surgery -You have been prescribed your pain medication (dilaudid) and bowel regimen  medications before surgery so that you can have these available when you are discharged from the hospital. The pain medication is for use ONLY AFTER surgery and a new prescription will not be given.   -Make sure that you have Tylenol and Ibuprofen (if you are able to take this) at home to use on a regular basis after surgery for pain control. We recommend alternating the medications every hour to six hours since they work differently and are processed in the body differently for pain relief.  -Review the attached handout on narcotic use and their risks and side effects.   Bowel Regimen -You have been prescribed Sennakot-S to take nightly to prevent constipation especially if you are taking the narcotic pain medication intermittently.  It is important to prevent constipation and drink adequate amounts of liquids. You can stop taking this medication when you are not taking pain medication and you are back on your normal bowel routine.  Risks of Surgery Risks of surgery are low but include bleeding, infection, damage to surrounding structures, re-operation, blood clots, and very rarely death.   Blood Transfusion Information (For the consent to be signed before surgery)  We will be checking your blood type before surgery so in case of emergencies, we will know what type of blood you would need.                                            WHAT IS A BLOOD TRANSFUSION?  A transfusion is the replacement of blood or some of its parts. Blood is made up of multiple cells which provide different functions. Red blood cells carry oxygen and are used for blood loss replacement. White blood cells fight against infection. Platelets control bleeding. Plasma helps clot blood. Other blood products are available for specialized needs, such as hemophilia or other clotting disorders. BEFORE THE TRANSFUSION  Who gives blood for transfusions?  You may be able to donate blood to be used at a later date on yourself  (autologous donation). Relatives can be asked to donate blood. This is generally not any safer than if you have received blood from a stranger. The same precautions are taken to ensure safety when a relative's blood is donated. Healthy volunteers who are fully evaluated to make sure their blood is safe. This is blood bank blood. Transfusion therapy is the safest it has ever been in the practice of medicine. Before blood is taken from a donor, a complete history is taken to make sure that person has no history of diseases nor engages in risky social behavior (examples are intravenous drug use or sexual activity with multiple partners). The donor's travel history is screened to minimize risk of transmitting infections, such as malaria. The donated blood is tested for signs of infectious diseases, such as HIV and hepatitis. The blood is then tested to be sure it is compatible with you in order to minimize the chance  of a transfusion reaction. If you or a relative donates blood, this is often done in anticipation of surgery and is not appropriate for emergency situations. It takes many days to process the donated blood. RISKS AND COMPLICATIONS Although transfusion therapy is very safe and saves many lives, the main dangers of transfusion include:  Getting an infectious disease. Developing a transfusion reaction. This is an allergic reaction to something in the blood you were given. Every precaution is taken to prevent this. The decision to have a blood transfusion has been considered carefully by your caregiver before blood is given. Blood is not given unless the benefits outweigh the risks.  AFTER SURGERY INSTRUCTIONS  Return to work: 4-6 weeks if applicable  Activity: 1. Be up and out of the bed during the day.  Take a nap if needed.  You may walk up steps but be careful and use the hand rail.  Stair climbing will tire you more than you think, you may need to stop part way and rest.   2. No lifting or  straining for 6 weeks over 10 pounds. No pushing, pulling, straining for 6 weeks.  3. No driving for 1 week(s).  Do not drive if you are taking narcotic pain medicine and make sure that your reaction time has returned.   4. You can shower as soon as the next day after surgery. Shower daily.  Use your regular soap and water (not directly on the incision) and pat your incision(s) dry afterwards; don't rub.  No tub baths or submerging your body in water until cleared by your surgeon. If you have the soap that was given to you by pre-surgical testing that was used before surgery, you do not need to use it afterwards because this can irritate your incisions.   5. No sexual activity and nothing in the vagina for 8 weeks.  6. You may experience a small amount of clear drainage from your incisions, which is normal.  If the drainage persists, increases, or changes color please call the office.  7. Do not use creams, lotions, or ointments such as neosporin on your incisions after surgery until advised by your surgeon because they can cause removal of the dermabond glue on your incisions.    8. You may experience vaginal spotting after surgery or around the 6-8 week mark from surgery when the stitches at the top of the vagina begin to dissolve.  The spotting is normal but if you experience heavy bleeding, call our office.  9. Take Tylenol or ibuprofen (if you are able to take this) first for pain and only use narcotic pain medication for severe pain not relieved by the Tylenol or Ibuprofen.  Monitor your Tylenol intake to a max of 4,000 mg in a 24 hour period. You can alternate these medications after surgery.  Diet: 1. Low sodium Heart Healthy Diet is recommended but you are cleared to resume your normal (before surgery) diet after your procedure.  2. It is safe to use a laxative, such as Miralax or Colace, if you have difficulty moving your bowels. You have been prescribed Sennakot at bedtime every evening  to keep bowel movements regular and to prevent constipation.    Wound Care: 1. Keep clean and dry.  Shower daily.  Reasons to call the Doctor: Fever - Oral temperature greater than 100.4 degrees Fahrenheit Foul-smelling vaginal discharge Difficulty urinating Nausea and vomiting Increased pain at the site of the incision that is unrelieved with pain medicine. Difficulty breathing  with or without chest pain New calf pain especially if only on one side Sudden, continuing increased vaginal bleeding with or without clots.   Contacts: For questions or concerns you should contact:  Dr. Jeral Pinch at [phone]  Joylene John, NP at [phone]  After Hours: call [phone] and have the GYN Oncologist paged/contacted (after 5 pm or on the weekends).  Messages sent via mychart are for non-urgent matters and are not responded to after hours so for urgent needs, please call the after hours number.

## 2021-09-22 NOTE — Progress Notes (Signed)
Patient here with her husband for new patient consultation with Dr. Jeral Pinch for endometrial cancer and for a pre-operative discussion prior to her scheduled surgery on October 14, 2021. She is scheduled for robotic assisted total laparoscopic hysterectomy, bilateral salpingo-oophorectomy, sentinel lymph node biopsy, possible lymph node dissection and other biopsies, possible laparotomy. The surgery was discussed in detail.  See after visit summary for additional details. Visual aids used to discuss items related to surgery including sequential compression stockings, foley catheter, IV pump, multi-modal pain regimen including tylenol, photo of the surgical robot, female reproductive system to discuss surgery in detail.      Discussed post-op pain management in detail including the aspects of the enhanced recovery pathway.  Advised her that a new prescription would be sent in for dilaudid and it is only to be used for after her upcoming surgery.  We discussed the use of tylenol post-op and to monitor for a maximum of 4,000 mg in a 24 hour period.  Also prescribed sennakot to be used after surgery and to hold if having loose stools.  Discussed bowel regimen in detail. She also reports significant post-op nausea and vomiting. Reports no relief with zofran and she has taken and tolerated phenergan in the past. Per Dr. Berline Lopes, plan for dilaudid tablets as needed for severe pain post-op. Advised patient of the risks including increased drowsiness and advised to use caution when taking dilaudid and phenergan.     Discussed the use of heparin pre-op, SCDs, and measures to take at home to prevent DVT including frequent mobility.  Reportable signs and symptoms of DVT discussed. Post-operative instructions discussed and expectations for after surgery. Incisional care discussed as well including reportable signs and symptoms including erythema, drainage, wound separation.     10 minutes spent with the patient.   Verbalizing understanding of material discussed. No needs or concerns voiced at the end of the visit.   Advised patient and family to call for any needs.  Advised that her post-operative medications had been prescribed and could be picked up at any time. CT scan instructions discussed and patient escorted to the lab for a Bmet prior to CT scan.   This appointment is included in the global surgical bundle as pre-operative teaching and has no charge.

## 2021-09-29 ENCOUNTER — Ambulatory Visit (HOSPITAL_COMMUNITY)
Admission: RE | Admit: 2021-09-29 | Discharge: 2021-09-29 | Disposition: A | Discharge status: Home / Self Care | Payer: Medicare Other | Source: Ambulatory Visit | Attending: Gynecologic Oncology | Admitting: Gynecologic Oncology

## 2021-09-29 ENCOUNTER — Encounter (HOSPITAL_COMMUNITY): Payer: Self-pay

## 2021-09-29 ENCOUNTER — Other Ambulatory Visit: Payer: Self-pay

## 2021-09-29 DIAGNOSIS — C541 Malignant neoplasm of endometrium: Secondary | ICD-10-CM | POA: Insufficient documentation

## 2021-09-29 MED ORDER — IOHEXOL 350 MG/ML SOLN
75.0000 mL | Freq: Once | INTRAVENOUS | Status: AC | PRN
  Administered 2021-09-29: 75 mL via INTRAVENOUS

## 2021-09-29 NOTE — Progress Notes (Signed)
DUE TO COVID-19 ONLY ONE VISITOR IS ALLOWED TO COME WITH YOU AND STAY IN THE WAITING ROOM ONLY DURING PRE OP AND PROCEDURE DAY OF SURGERY. THE 1 VISITOR  MAY VISIT WITH YOU AFTER SURGERY IN YOUR PRIVATE ROOM DURING VISITING HOURS ONLY!  YOU NEED TO HAVE A COVID 19 TEST ON_______ @_______ , THIS TEST MUST BE DONE BEFORE SURGERY,  COVID TESTING SITE IS AT [place]. PLEASE REMAIN IN YOUR CAR THIS IS A DRIVER UP TEST. AFTER YOUR COVID TEST PLEASE WEAR A MASK OUT IN PUBLIC AND SOCIAL DISTANCE AND [place] YOUR HANDS FREQUENTLY. PLEASE ASK ALL YOUR CLOSE CONTACTS TO WEAR A MASK OUT IN PUBLIC AND SOCIAL DISTANCE AND [place] HANDS FREQUENTLY ALSO.               Kristin Carlson  09/29/2021   Your procedure is scheduled on:      10/14/2021   Report to [facility] Main  Entrance   Report to admitting at  [place] AM     Call this number if you have problems the morning of surgery [phone]    Remember: Do not eat food , candy gum or mints :After Midnight. You may have clear liquids from midnight until __  0430am    CLEAR LIQUID DIET   Foods Allowed                                                                       Coffee and tea, regular and decaf                              Plain Jell-O any favor except red or purple                                            Fruit ices (not with fruit pulp)                                      Iced Popsicles                                     Carbonated beverages, regular and diet                                    Cranberry, grape and apple juices Sports drinks like Gatorade Lightly seasoned clear broth or consume(fat free) Sugar   _____________________________________________________________________    BRUSH YOUR TEETH MORNING OF SURGERY AND RINSE YOUR MOUTH OUT, NO CHEWING GUM CANDY OR MINTS.     Take these medicines the morning of surgery with A SIP OF WATER:  synthroid, magnesium, protonix, propanolol   DO NOT TAKE ANY  DIABETIC MEDICATIONS DAY OF YOUR SURGERY  You may not have any metal on your body including hair pins and              piercings  Do not wear jewelry, make-up, lotions, powders or perfumes, deodorant             Do not wear nail polish on your fingernails.  Do not shave  48 hours prior to surgery.              Men may shave face and neck.   Do not bring valuables to the hospital. [place].  Contacts, dentures or bridgework may not be worn into surgery.  Leave suitcase in the car. After surgery it may be brought to your room.     Patients discharged the day of surgery will not be allowed to drive home. IF YOU ARE HAVING SURGERY AND GOING HOME THE SAME DAY, YOU MUST HAVE AN ADULT TO DRIVE YOU HOME AND BE WITH YOU FOR 24 HOURS. YOU MAY GO HOME BY TAXI OR UBER OR ORTHERWISE, BUT AN ADULT MUST ACCOMPANY YOU HOME AND STAY WITH YOU FOR 24 HOURS.  Name and phone number of your driver:  Special Instructions: N/A              Please read over the following fact sheets you were given: _____________________________________________________________________  [facility] - Preparing for Surgery Before surgery, you can play an important role.  Because skin is not sterile, your skin needs to be as free of germs as possible.  You can reduce the number of germs on your skin by washing with CHG (chlorahexidine gluconate) soap before surgery.  CHG is an antiseptic cleaner which kills germs and bonds with the skin to continue killing germs even after washing. Please DO NOT use if you have an allergy to CHG or antibacterial soaps.  If your skin becomes reddened/irritated stop using the CHG and inform your nurse when you arrive at Short Stay. Do not shave (including legs and underarms) for at least 48 hours prior to the first CHG shower.  You may shave your face/neck. Please follow these instructions carefully:  1.  Shower with CHG Soap  the night before surgery and the  morning of Surgery.  2.  If you choose to wash your hair, wash your hair first as usual with your  normal  shampoo.  3.  After you shampoo, rinse your hair and body thoroughly to remove the  shampoo.                           4.  Use CHG as you would any other liquid soap.  You can apply chg directly  to the skin and wash                       Gently with a scrungie or clean washcloth.  5.  Apply the CHG Soap to your body ONLY FROM THE NECK DOWN.   Do not use on face/ open                           Wound or open sores. Avoid contact with eyes, ears mouth and genitals (private parts).  Wash face,  Genitals (private parts) with your normal soap.             6.  Wash thoroughly, paying special attention to the area where your surgery  will be performed.  7.  Thoroughly rinse your body with warm water from the neck down.  8.  DO NOT shower/wash with your normal soap after using and rinsing off  the CHG Soap.                9.  Pat yourself dry with a clean towel.            10.  Wear clean pajamas.            11.  Place clean sheets on your bed the night of your first shower and do not  sleep with pets. Day of Surgery : Do not apply any lotions/deodorants the morning of surgery.  Please wear clean clothes to the hospital/surgery center.  FAILURE TO FOLLOW THESE INSTRUCTIONS MAY RESULT IN THE CANCELLATION OF YOUR SURGERY PATIENT SIGNATURE_________________________________  NURSE SIGNATURE__________________________________  ________________________________________________________________________

## 2021-09-29 NOTE — Progress Notes (Addendum)
Anesthesia Review:  PCP: Sharyn Creamer , NP at [place] in [facility] Cardiologist : none  Chest x-ray : CT chest 09/29/21  EKG :10/01/2021  Echo : Stress test: Cardiac Cath :  Activity level: can do a flight of stairs without difficulty  Sleep Study/ CPAP : none  Fasting Blood Sugar :      / Checks Blood Sugar -- times a day:   Blood Thinner/ Instructions /Last Dose: ASA / Instructions/ Last Dose :  No covid test ambulatory surgery

## 2021-10-01 ENCOUNTER — Encounter (HOSPITAL_COMMUNITY)
Admission: RE | Admit: 2021-10-01 | Discharge: 2021-10-01 | Disposition: A | Discharge status: Home / Self Care | Payer: Medicare Other | Source: Ambulatory Visit | Attending: Gynecologic Oncology | Admitting: Gynecologic Oncology

## 2021-10-01 ENCOUNTER — Other Ambulatory Visit: Payer: Self-pay

## 2021-10-01 ENCOUNTER — Encounter (HOSPITAL_COMMUNITY): Payer: Self-pay

## 2021-10-01 DIAGNOSIS — Z87891 Personal history of nicotine dependence: Secondary | ICD-10-CM | POA: Diagnosis not present

## 2021-10-01 DIAGNOSIS — Z79899 Other long term (current) drug therapy: Secondary | ICD-10-CM | POA: Insufficient documentation

## 2021-10-01 DIAGNOSIS — Z01818 Encounter for other preprocedural examination: Secondary | ICD-10-CM | POA: Diagnosis present

## 2021-10-01 DIAGNOSIS — K219 Gastro-esophageal reflux disease without esophagitis: Secondary | ICD-10-CM | POA: Insufficient documentation

## 2021-10-01 DIAGNOSIS — C541 Malignant neoplasm of endometrium: Secondary | ICD-10-CM | POA: Insufficient documentation

## 2021-10-01 DIAGNOSIS — I10 Essential (primary) hypertension: Secondary | ICD-10-CM | POA: Insufficient documentation

## 2021-10-01 HISTORY — DX: Nausea with vomiting, unspecified: R11.2

## 2021-10-01 HISTORY — DX: Unspecified osteoarthritis, unspecified site: M19.90

## 2021-10-01 HISTORY — DX: Other specified postprocedural states: Z98.890

## 2021-10-01 HISTORY — DX: Pneumonia, unspecified organism: J18.9

## 2021-10-01 LAB — CBC
HCT: 42.5 % (ref 36.0–46.0)
Hemoglobin: 14.1 g/dL (ref 12.0–15.0)
MCH: 30.3 pg (ref 26.0–34.0)
MCHC: 33.2 g/dL (ref 30.0–36.0)
MCV: 91.2 fL (ref 80.0–100.0)
Platelets: 203 10*3/uL (ref 150–400)
RBC: 4.66 MIL/uL (ref 3.87–5.11)
RDW: 12.8 % (ref 11.5–15.5)
WBC: 4.6 10*3/uL (ref 4.0–10.5)
nRBC: 0 % (ref 0.0–0.2)

## 2021-10-01 LAB — TYPE AND SCREEN
ABO/RH(D): A NEG
Antibody Screen: NEGATIVE

## 2021-10-01 LAB — URINALYSIS, ROUTINE W REFLEX MICROSCOPIC
Bilirubin Urine: NEGATIVE
Glucose, UA: NEGATIVE mg/dL
Hgb urine dipstick: NEGATIVE
Ketones, ur: NEGATIVE mg/dL
Leukocytes,Ua: NEGATIVE
Nitrite: NEGATIVE
Protein, ur: NEGATIVE mg/dL
Specific Gravity, Urine: 1.008 (ref 1.005–1.030)
pH: 6 (ref 5.0–8.0)

## 2021-10-01 LAB — COMPREHENSIVE METABOLIC PANEL
ALT: 20 U/L (ref 0–44)
AST: 26 U/L (ref 15–41)
Albumin: 3.7 g/dL (ref 3.5–5.0)
Alkaline Phosphatase: 54 U/L (ref 38–126)
Anion gap: 6 (ref 5–15)
BUN: 17 mg/dL (ref 8–23)
CO2: 26 mmol/L (ref 22–32)
Calcium: 9 mg/dL (ref 8.9–10.3)
Chloride: 104 mmol/L (ref 98–111)
Creatinine, Ser: 0.89 mg/dL (ref 0.44–1.00)
GFR, Estimated: >60 mL/min (ref >60)
Glucose, Bld: 99 mg/dL (ref 70–99)
Potassium: 4.6 mmol/L (ref 3.5–5.1)
Sodium: 136 mmol/L (ref 135–145)
Total Bilirubin: 0.7 mg/dL (ref 0.3–1.2)
Total Protein: 6.8 g/dL (ref 6.5–8.1)

## 2021-10-02 NOTE — Progress Notes (Signed)
Anesthesia Chart Review   Case: [id number] Date/Time: 10/14/21 0715   Procedures:      XI ROBOTIC ASSISTED TOTAL HYSTERECTOMY WITH BILATERAL SALPINGO OOPHORECTOMY, POSSIBLE LAPAROTOMY, POSSIBLE OMENTECTOMY (Bilateral)     SENTINEL NODE BIOPSY     POSSIBLE LYMPH NODE DISSECTION   Anesthesia type: General   Pre-op diagnosis: ENDOMETRIAL CANCER   Location: [facility] ROOM 03 / [facility]   Surgeons: Lafonda Mosses, MD       DISCUSSION:80 y.o. former smoker with h/o PONV, HTN, GERD, endometrial cancer scheduled for above procedure 10/14/2021 with Dr. Jeral Pinch.   Last seen by PCP 07/08/2021.   Pt reports she can climb a flight of stairs without difficulty.   Anticipate pt can proceed with planned procedure barring acute status change.   VS: BP (!) 161/84   Pulse 60   Temp 36.9 C (Oral)   Resp 16   Ht 5\' 3"  (1.6 m)   Wt 80.7 kg   SpO2 98%   BMI 31.53 kg/m   PROVIDERS: [name], FNP is PCP    LABS: Labs reviewed: Acceptable for surgery. (all labs ordered are listed, but only abnormal results are displayed)  Labs Reviewed  CBC  COMPREHENSIVE METABOLIC PANEL  URINALYSIS, ROUTINE W REFLEX MICROSCOPIC  TYPE AND SCREEN     IMAGES:   EKG: 10/01/21 Rate 59 bpm  Sinus bradycardia Low voltage QRS Incomplete right bundle branch block Cannot rule out Anterior infarct , age undetermined Abnormal ECG  CV: CT Screening Cardiac Calcium Score 01/16/2020 CORONARY FINDINGS: Focal calcified atherosclerotic plaquing is seen within the proximal left anterior descending coronary artery. No other significant calcified coronary atherosclerosis.   These regions were quantitatively scored for calcium as follows:   Region     Agatston Score   Left main coronary artery  Right coronary artery  Left anterior descending coronary artery       49  Left circumflex coronary artery  Posterior descending artery   TOTAL                                          49   ADDITIONAL  FINDINGS: Linear left lower lobe atelectasis and/or scarring appears stable. No new pulmonary consolidation or pleural effusion. The heart is normal in size. Past Medical History:  Diagnosis Date   Actinic keratosis 06/04/2021   right anteromedial thigh   Arthritis    Basal cell carcinoma 06/04/2021   right suprapubic   Basal cell carcinoma 07/29/2021   left nasal ala. atypical basaloid cells   GERD (gastroesophageal reflux disease)    History of basal cell carcinoma 07/29/2021   left thigh, EDC at time of bx   HLD (hyperlipidemia)    Hypertension    Hypothyroidism    Pneumonia    HX OF YEARS AGO   PONV (postoperative nausea and vomiting)     Past Surgical History:  Procedure Laterality Date   cataract Bilateral    HIP SURGERY     KNEE ARTHROSCOPY Left 2003   NECK SURGERY  1997   Fusion C3, C4, C5   THYROIDECTOMY  2007   TOTAL HIP ARTHROPLASTY Bilateral    Left Hip -2015, Right Hip 208    MEDICATIONS:  Cholecalciferol 50 MCG (2000 UT) CAPS   clindamycin (CLEOCIN) 150 MG capsule   HYDROmorphone (DILAUDID) 2 MG tablet   levothyroxine (SYNTHROID) 137 MCG tablet  magnesium oxide (MAG-OX) 400 MG tablet   Multiple Vitamins-Minerals (MULTIVITAMIN WOMEN 50+ PO)   Omega-3 1000 MG CAPS   pantoprazole (PROTONIX) 20 MG tablet   promethazine (PHENERGAN) 12.5 MG tablet   propranolol (INDERAL) 20 MG tablet   rosuvastatin (CRESTOR) 5 MG tablet   senna-docusate (SENOKOT-S) 8.6-50 MG tablet   temazepam (RESTORIL) 15 MG capsule   No current facility-administered medications for this encounter.     Konrad Felix Ward, PA-C [facility] Pre-Surgical Testing [phone]

## 2021-10-13 ENCOUNTER — Telehealth: Payer: Self-pay

## 2021-10-13 NOTE — Anesthesia Preprocedure Evaluation (Addendum)
Anesthesia Evaluation  Patient identified by MRN, date of birth, ID band Patient awake    Reviewed: Allergy & Precautions, NPO status , Patient's Chart, lab work & pertinent test results  History of Anesthesia Complications (+) PONV and history of anesthetic complications  Airway Mallampati: II  TM Distance: >3 FB Neck ROM: Full    Dental  (+) Dental Advisory Given, Teeth Intact   Pulmonary pneumonia, former smoker,    Pulmonary exam normal breath sounds clear to auscultation       Cardiovascular hypertension, Pt. on home beta blockers and Pt. on medications Normal cardiovascular exam Rhythm:Regular Rate:Normal     Neuro/Psych negative neurological ROS     GI/Hepatic Neg liver ROS, GERD  ,  Endo/Other  Hypothyroidism   Renal/GU negative Renal ROS     Musculoskeletal  (+) Arthritis ,   Abdominal (+) + obese,   Peds  Hematology negative hematology ROS (+)   Anesthesia Other Findings   Reproductive/Obstetrics                           Anesthesia Physical Anesthesia Plan  ASA: 2  Anesthesia Plan: General   Post-op Pain Management:    Induction: Intravenous  PONV Risk Score and Plan:   Airway Management Planned: Oral ETT  Additional Equipment:   Intra-op Plan:   Post-operative Plan: Extubation in OR  Informed Consent: I have reviewed the patients History and Physical, chart, labs and discussed the procedure including the risks, benefits and alternatives for the proposed anesthesia with the patient or authorized representative who has indicated his/her understanding and acceptance.     Dental advisory given  Plan Discussed with: CRNA  Anesthesia Plan Comments: (2 x PIV)       Anesthesia Quick Evaluation

## 2021-10-13 NOTE — Telephone Encounter (Signed)
Attempted to reach patient to check on pre-operative status. Unable to reach patient and unable to leave voicemail.

## 2021-10-14 ENCOUNTER — Encounter (HOSPITAL_COMMUNITY): Payer: Self-pay | Admitting: Gynecologic Oncology

## 2021-10-14 ENCOUNTER — Encounter (HOSPITAL_COMMUNITY)
Admission: RE | Disposition: A | Discharge status: Home / Self Care | Payer: Self-pay | Source: Ambulatory Visit | Attending: Gynecologic Oncology

## 2021-10-14 ENCOUNTER — Ambulatory Visit (HOSPITAL_COMMUNITY)
Admission: RE | Admit: 2021-10-14 | Discharge: 2021-10-14 | Disposition: A | Discharge status: Home / Self Care | Payer: Medicare Other | Source: Ambulatory Visit | Attending: Gynecologic Oncology | Admitting: Gynecologic Oncology

## 2021-10-14 ENCOUNTER — Ambulatory Visit (HOSPITAL_COMMUNITY): Payer: Medicare Other | Admitting: Physician Assistant

## 2021-10-14 ENCOUNTER — Other Ambulatory Visit: Payer: Self-pay

## 2021-10-14 DIAGNOSIS — Z885 Allergy status to narcotic agent status: Secondary | ICD-10-CM | POA: Diagnosis not present

## 2021-10-14 DIAGNOSIS — E039 Hypothyroidism, unspecified: Secondary | ICD-10-CM | POA: Insufficient documentation

## 2021-10-14 DIAGNOSIS — K5900 Constipation, unspecified: Secondary | ICD-10-CM | POA: Diagnosis not present

## 2021-10-14 DIAGNOSIS — Z79890 Hormone replacement therapy (postmenopausal): Secondary | ICD-10-CM | POA: Diagnosis not present

## 2021-10-14 DIAGNOSIS — C541 Malignant neoplasm of endometrium: Secondary | ICD-10-CM | POA: Diagnosis present

## 2021-10-14 DIAGNOSIS — I10 Essential (primary) hypertension: Secondary | ICD-10-CM | POA: Insufficient documentation

## 2021-10-14 DIAGNOSIS — E785 Hyperlipidemia, unspecified: Secondary | ICD-10-CM | POA: Insufficient documentation

## 2021-10-14 DIAGNOSIS — C563 Malignant neoplasm of bilateral ovaries: Secondary | ICD-10-CM | POA: Diagnosis not present

## 2021-10-14 DIAGNOSIS — N3946 Mixed incontinence: Secondary | ICD-10-CM | POA: Diagnosis not present

## 2021-10-14 DIAGNOSIS — N950 Postmenopausal bleeding: Secondary | ICD-10-CM | POA: Diagnosis not present

## 2021-10-14 HISTORY — PX: SENTINEL NODE BIOPSY: SHX6608

## 2021-10-14 HISTORY — PX: ROBOTIC ASSISTED TOTAL HYSTERECTOMY WITH BILATERAL SALPINGO OOPHERECTOMY: SHX6086

## 2021-10-14 LAB — ABO/RH: ABO/RH(D): A NEG

## 2021-10-14 SURGERY — HYSTERECTOMY, TOTAL, ROBOT-ASSISTED, LAPAROSCOPIC, WITH BILATERAL SALPINGO-OOPHORECTOMY
Anesthesia: General

## 2021-10-14 MED ORDER — KETAMINE HCL 10 MG/ML IJ SOLN
INTRAMUSCULAR | Status: DC | PRN
  Administered 2021-10-14: 40 mg via INTRAVENOUS

## 2021-10-14 MED ORDER — SODIUM CHLORIDE (PF) 0.9 % IJ SOLN
INTRAMUSCULAR | Status: AC
  Filled 2021-10-14: qty 20

## 2021-10-14 MED ORDER — LACTATED RINGERS IV SOLN: INTRAVENOUS | Status: DC

## 2021-10-14 MED ORDER — FENTANYL CITRATE (PF) 100 MCG/2ML IJ SOLN
INTRAMUSCULAR | Status: DC | PRN
  Administered 2021-10-14: 25 ug via INTRAVENOUS
  Administered 2021-10-14: 100 ug via INTRAVENOUS
  Administered 2021-10-14: 25 ug via INTRAVENOUS
  Administered 2021-10-14: 50 ug via INTRAVENOUS

## 2021-10-14 MED ORDER — SUGAMMADEX SODIUM 200 MG/2ML IV SOLN
INTRAVENOUS | Status: DC | PRN
  Administered 2021-10-14: 200 mg via INTRAVENOUS

## 2021-10-14 MED ORDER — ACETAMINOPHEN 500 MG PO TABS
1000.0000 mg | ORAL_TABLET | ORAL | Status: AC
  Administered 2021-10-14: 1000 mg via ORAL
  Filled 2021-10-14: qty 2

## 2021-10-14 MED ORDER — HEPARIN SODIUM (PORCINE) 5000 UNIT/ML IJ SOLN
5000.0000 [IU] | INTRAMUSCULAR | Status: AC
  Administered 2021-10-14: 5000 [IU] via SUBCUTANEOUS
  Filled 2021-10-14: qty 1

## 2021-10-14 MED ORDER — CHLORHEXIDINE GLUCONATE 0.12 % MT SOLN
15.0000 mL | Freq: Once | OROMUCOSAL | Status: AC
  Administered 2021-10-14: 15 mL via OROMUCOSAL

## 2021-10-14 MED ORDER — HYDROMORPHONE HCL 1 MG/ML IJ SOLN
INTRAMUSCULAR | Status: AC
  Administered 2021-10-14: 0.5 mg via INTRAVENOUS
  Filled 2021-10-14: qty 1

## 2021-10-14 MED ORDER — DEXAMETHASONE SODIUM PHOSPHATE 10 MG/ML IJ SOLN
INTRAMUSCULAR | Status: AC
  Filled 2021-10-14: qty 1

## 2021-10-14 MED ORDER — HYDROMORPHONE HCL 1 MG/ML IJ SOLN
0.2500 mg | INTRAMUSCULAR | Status: DC | PRN
  Administered 2021-10-14 (×2): 0.5 mg via INTRAVENOUS

## 2021-10-14 MED ORDER — SODIUM CHLORIDE (PF) 0.9 % IJ SOLN
INTRAMUSCULAR | Status: DC | PRN
  Administered 2021-10-14: 20 mL

## 2021-10-14 MED ORDER — FENTANYL CITRATE (PF) 100 MCG/2ML IJ SOLN
INTRAMUSCULAR | Status: AC
  Filled 2021-10-14: qty 2

## 2021-10-14 MED ORDER — BUPIVACAINE LIPOSOME 1.3 % IJ SUSP
INTRAMUSCULAR | Status: DC | PRN
  Administered 2021-10-14: 20 mL

## 2021-10-14 MED ORDER — DROPERIDOL 2.5 MG/ML IJ SOLN
INTRAMUSCULAR | Status: AC
  Administered 2021-10-14: 0.625 mg via INTRAVENOUS
  Filled 2021-10-14: qty 2

## 2021-10-14 MED ORDER — ONDANSETRON HCL 4 MG/2ML IJ SOLN
INTRAMUSCULAR | Status: DC | PRN
  Administered 2021-10-14: 4 mg via INTRAVENOUS

## 2021-10-14 MED ORDER — LIDOCAINE HCL (PF) 2 % IJ SOLN
INTRAMUSCULAR | Status: DC | PRN
  Administered 2021-10-14: 60 mg via INTRADERMAL

## 2021-10-14 MED ORDER — PHENYLEPHRINE 40 MCG/ML (10ML) SYRINGE FOR IV PUSH (FOR BLOOD PRESSURE SUPPORT)
PREFILLED_SYRINGE | INTRAVENOUS | Status: DC | PRN
  Administered 2021-10-14: 40 ug via INTRAVENOUS

## 2021-10-14 MED ORDER — EPHEDRINE SULFATE-NACL 50-0.9 MG/10ML-% IV SOSY
PREFILLED_SYRINGE | INTRAVENOUS | Status: DC | PRN
  Administered 2021-10-14: 5 mg via INTRAVENOUS
  Administered 2021-10-14: 7.5 mg via INTRAVENOUS
  Administered 2021-10-14: 5 mg via INTRAVENOUS

## 2021-10-14 MED ORDER — STERILE WATER FOR INJECTION IJ SOLN
INTRAMUSCULAR | Status: DC | PRN
  Administered 2021-10-14: 10 mL via INTRAMUSCULAR

## 2021-10-14 MED ORDER — LIDOCAINE HCL (PF) 2 % IJ SOLN
INTRAMUSCULAR | Status: AC
  Filled 2021-10-14: qty 5

## 2021-10-14 MED ORDER — LACTATED RINGERS IV SOLN: INTRAVENOUS | Status: DC | PRN

## 2021-10-14 MED ORDER — PROMETHAZINE HCL 25 MG/ML IJ SOLN: 6.2500 mg | INTRAMUSCULAR | Status: DC | PRN

## 2021-10-14 MED ORDER — PROPOFOL 10 MG/ML IV BOLUS
INTRAVENOUS | Status: AC
  Filled 2021-10-14: qty 20

## 2021-10-14 MED ORDER — DEXAMETHASONE SODIUM PHOSPHATE 4 MG/ML IJ SOLN: 4.0000 mg | INTRAMUSCULAR | Status: DC

## 2021-10-14 MED ORDER — BUPIVACAINE HCL 0.25 % IJ SOLN
INTRAMUSCULAR | Status: DC | PRN
  Administered 2021-10-14: 50 mL

## 2021-10-14 MED ORDER — LACTATED RINGERS IR SOLN
Status: DC | PRN
  Administered 2021-10-14: 1000 mL

## 2021-10-14 MED ORDER — STERILE WATER FOR INJECTION IJ SOLN
INTRAMUSCULAR | Status: AC
  Filled 2021-10-14: qty 10

## 2021-10-14 MED ORDER — KETAMINE HCL 10 MG/ML IJ SOLN
INTRAMUSCULAR | Status: AC
  Filled 2021-10-14: qty 1

## 2021-10-14 MED ORDER — ROCURONIUM BROMIDE 10 MG/ML (PF) SYRINGE
PREFILLED_SYRINGE | INTRAVENOUS | Status: DC | PRN
Start: 2021-10-14 — End: 2021-10-14
  Administered 2021-10-14: 60 mg via INTRAVENOUS
  Administered 2021-10-14: 10 mg via INTRAVENOUS
  Administered 2021-10-14: 20 mg via INTRAVENOUS
  Administered 2021-10-14: 10 mg via INTRAVENOUS

## 2021-10-14 MED ORDER — DIPHENHYDRAMINE HCL 50 MG/ML IJ SOLN
INTRAMUSCULAR | Status: DC | PRN
  Administered 2021-10-14: 12.5 mg via INTRAVENOUS

## 2021-10-14 MED ORDER — PHENYLEPHRINE HCL-NACL 20-0.9 MG/250ML-% IV SOLN
INTRAVENOUS | Status: DC | PRN
Start: 2021-10-14 — End: 2021-10-14
  Administered 2021-10-14: 50 ug/min via INTRAVENOUS

## 2021-10-14 MED ORDER — PROPOFOL 10 MG/ML IV BOLUS
INTRAVENOUS | Status: DC | PRN
  Administered 2021-10-14: 130 mg via INTRAVENOUS

## 2021-10-14 MED ORDER — DEXAMETHASONE SODIUM PHOSPHATE 10 MG/ML IJ SOLN
INTRAMUSCULAR | Status: DC | PRN
  Administered 2021-10-14: 8 mg via INTRAVENOUS

## 2021-10-14 MED ORDER — BUPIVACAINE HCL 0.25 % IJ SOLN
INTRAMUSCULAR | Status: AC
  Filled 2021-10-14: qty 1

## 2021-10-14 MED ORDER — CEFAZOLIN SODIUM-DEXTROSE 2-4 GM/100ML-% IV SOLN
2.0000 g | INTRAVENOUS | Status: AC
  Administered 2021-10-14: 2 g via INTRAVENOUS
  Filled 2021-10-14: qty 100

## 2021-10-14 MED ORDER — ONDANSETRON HCL 4 MG/2ML IJ SOLN
INTRAMUSCULAR | Status: AC
  Filled 2021-10-14: qty 2

## 2021-10-14 MED ORDER — PHENYLEPHRINE HCL (PRESSORS) 10 MG/ML IV SOLN
INTRAVENOUS | Status: AC
  Filled 2021-10-14: qty 2

## 2021-10-14 MED ORDER — DROPERIDOL 2.5 MG/ML IJ SOLN: 0.6250 mg | Freq: Once | INTRAMUSCULAR | Status: AC | PRN

## 2021-10-14 MED ORDER — DIPHENHYDRAMINE HCL 50 MG/ML IJ SOLN
INTRAMUSCULAR | Status: AC
  Filled 2021-10-14: qty 1

## 2021-10-14 MED ORDER — PROMETHAZINE HCL 25 MG/ML IJ SOLN
INTRAMUSCULAR | Status: AC
  Administered 2021-10-14: 6.25 mg via INTRAVENOUS
  Filled 2021-10-14: qty 1

## 2021-10-14 MED ORDER — STERILE WATER FOR IRRIGATION IR SOLN
Status: DC | PRN
  Administered 2021-10-14: 1000 mL

## 2021-10-14 MED ORDER — STERILE WATER FOR INJECTION IJ SOLN
INTRAMUSCULAR | Status: DC | PRN
  Administered 2021-10-14: 10 mL

## 2021-10-14 MED ORDER — BUPIVACAINE LIPOSOME 1.3 % IJ SUSP
INTRAMUSCULAR | Status: AC
  Filled 2021-10-14: qty 20

## 2021-10-14 SURGICAL SUPPLY — 70 items
APPLICATOR ARISTA FLEXITIP XL (MISCELLANEOUS) ×4 IMPLANT
APPLICATOR SURGIFLO ENDO (HEMOSTASIS) IMPLANT
BAG COUNTER SPONGE SURGICOUNT (BAG) IMPLANT
BAG LAPAROSCOPIC 12 15 PORT 16 (BASKET) IMPLANT
BAG RETRIEVAL 12/15 (BASKET)
BAG RETRIEVAL 12/15MM (BASKET)
BAG SURGICOUNT SPONGE COUNTING (BAG)
BLADE SURG SZ10 CARB STEEL (BLADE) IMPLANT
COVER BACK TABLE 60X90IN (DRAPES) ×4 IMPLANT
COVER TIP SHEARS 8 DVNC (MISCELLANEOUS) ×2 IMPLANT
COVER TIP SHEARS 8MM DA VINCI (MISCELLANEOUS) ×2
DECANTER SPIKE VIAL GLASS SM (MISCELLANEOUS) IMPLANT
DERMABOND ADVANCED (GAUZE/BANDAGES/DRESSINGS) ×2
DERMABOND ADVANCED .7 DNX12 (GAUZE/BANDAGES/DRESSINGS) ×2 IMPLANT
DRAPE ARM DVNC X/XI (DISPOSABLE) ×8 IMPLANT
DRAPE COLUMN DVNC XI (DISPOSABLE) ×2 IMPLANT
DRAPE DA VINCI XI ARM (DISPOSABLE) ×8
DRAPE DA VINCI XI COLUMN (DISPOSABLE) ×2
DRAPE SHEET LG 3/4 BI-LAMINATE (DRAPES) ×4 IMPLANT
DRAPE SURG IRRIG POUCH 19X23 (DRAPES) ×4 IMPLANT
DRSG OPSITE POSTOP 4X6 (GAUZE/BANDAGES/DRESSINGS) ×4 IMPLANT
DRSG OPSITE POSTOP 4X8 (GAUZE/BANDAGES/DRESSINGS) IMPLANT
ELECT PENCIL ROCKER SW 15FT (MISCELLANEOUS) ×4 IMPLANT
ELECT REM PT RETURN 15FT ADLT (MISCELLANEOUS) ×4 IMPLANT
GAUZE 4X4 16PLY ~~LOC~~+RFID DBL (SPONGE) ×4 IMPLANT
GLOVE SURG ENC MOIS LTX SZ6 (GLOVE) ×16 IMPLANT
GLOVE SURG ENC MOIS LTX SZ6.5 (GLOVE) ×8 IMPLANT
GOWN STRL REUS W/ TWL LRG LVL3 (GOWN DISPOSABLE) ×8 IMPLANT
GOWN STRL REUS W/TWL LRG LVL3 (GOWN DISPOSABLE) ×8
HEMOSTAT ARISTA ABSORB 3G PWDR (HEMOSTASIS) ×4 IMPLANT
HOLDER FOLEY CATH W/STRAP (MISCELLANEOUS) ×4 IMPLANT
IRRIG SUCT STRYKERFLOW 2 WTIP (MISCELLANEOUS) ×4
IRRIGATION SUCT STRKRFLW 2 WTP (MISCELLANEOUS) ×2 IMPLANT
KIT PROCEDURE DA VINCI SI (MISCELLANEOUS) ×2
KIT PROCEDURE DVNC SI (MISCELLANEOUS) ×2 IMPLANT
KIT TURNOVER KIT A (KITS) IMPLANT
MANIPULATOR ADVINCU DEL 3.0 PL (MISCELLANEOUS) IMPLANT
MANIPULATOR UTERINE 4.5 ZUMI (MISCELLANEOUS) ×4 IMPLANT
NEEDLE HYPO 21X1.5 SAFETY (NEEDLE) ×4 IMPLANT
NEEDLE SPNL 18GX3.5 QUINCKE PK (NEEDLE) ×4 IMPLANT
OBTURATOR OPTICAL STANDARD 8MM (TROCAR) ×2
OBTURATOR OPTICAL STND 8 DVNC (TROCAR) ×2
OBTURATOR OPTICALSTD 8 DVNC (TROCAR) ×2 IMPLANT
PACK ROBOT GYN CUSTOM WL (TRAY / TRAY PROCEDURE) ×4 IMPLANT
PAD POSITIONING PINK XL (MISCELLANEOUS) ×4 IMPLANT
PORT ACCESS TROCAR AIRSEAL 12 (TROCAR) ×2 IMPLANT
PORT ACCESS TROCAR AIRSEAL 5M (TROCAR) ×2
POUCH SPECIMEN RETRIEVAL 10MM (ENDOMECHANICALS) ×4 IMPLANT
SCRUB EXIDINE 4% CHG 4OZ (MISCELLANEOUS) ×4 IMPLANT
SEAL CANN UNIV 5-8 DVNC XI (MISCELLANEOUS) ×8 IMPLANT
SEAL XI 5MM-8MM UNIVERSAL (MISCELLANEOUS) ×8
SET TRI-LUMEN FLTR TB AIRSEAL (TUBING) ×4 IMPLANT
SPONGE T-LAP 18X18 ~~LOC~~+RFID (SPONGE) ×4 IMPLANT
SURGIFLO W/THROMBIN 8M KIT (HEMOSTASIS) IMPLANT
SUT MNCRL AB 4-0 PS2 18 (SUTURE) ×4 IMPLANT
SUT PDS AB 1 TP1 96 (SUTURE) ×8 IMPLANT
SUT VIC AB 0 CT1 27 (SUTURE)
SUT VIC AB 0 CT1 27XBRD ANTBC (SUTURE) IMPLANT
SUT VIC AB 2-0 CT1 27 (SUTURE) ×2
SUT VIC AB 2-0 CT1 TAPERPNT 27 (SUTURE) ×2 IMPLANT
SUT VIC AB 4-0 PS2 18 (SUTURE) ×8 IMPLANT
SYR 10ML LL (SYRINGE) ×4 IMPLANT
SYR BULB IRRIG 60ML STRL (SYRINGE) ×4 IMPLANT
TOWEL OR NON WOVEN STRL DISP B (DISPOSABLE) ×4 IMPLANT
TRAP SPECIMEN MUCUS 40CC (MISCELLANEOUS) IMPLANT
TRAY FOLEY MTR SLVR 16FR STAT (SET/KITS/TRAYS/PACK) ×4 IMPLANT
TROCAR XCEL NON-BLD 5MMX100MML (ENDOMECHANICALS) ×4 IMPLANT
UNDERPAD 30X36 HEAVY ABSORB (UNDERPADS AND DIAPERS) ×4 IMPLANT
WATER STERILE IRR 1000ML POUR (IV SOLUTION) ×4 IMPLANT
YANKAUER SUCT BULB TIP 10FT TU (MISCELLANEOUS) ×4 IMPLANT

## 2021-10-14 NOTE — Interval H&P Note (Signed)
History and Physical Interval Note:  10/14/2021 6:57 AM  Kristin Carlson  has presented today for surgery, with the diagnosis of ENDOMETRIAL CANCER.  The various methods of treatment have been discussed with the patient and family. After consideration of risks, benefits and other options for treatment, the patient has consented to  Procedure(s): XI ROBOTIC ASSISTED TOTAL HYSTERECTOMY WITH BILATERAL SALPINGO OOPHORECTOMY, POSSIBLE LAPAROTOMY, POSSIBLE OMENTECTOMY (Bilateral) SENTINEL NODE BIOPSY (N/A) POSSIBLE LYMPH NODE DISSECTION (N/A) as a surgical intervention.  The patient's history has been reviewed, patient examined, no change in status, stable for surgery.  I have reviewed the patient's chart and labs.  Questions were answered to the patient's satisfaction.     Lafonda Mosses

## 2021-10-14 NOTE — Discharge Instructions (Signed)

## 2021-10-14 NOTE — Transfer of Care (Signed)
Immediate Anesthesia Transfer of Care Note  Patient: Kristin Carlson  Procedure(s) Performed: XI ROBOTIC ASSISTED TOTAL HYSTERECTOMY WITH BILATERAL SALPINGO OOPHORECTOMY, LYMPH NODE DISSECTION,  LAPAROTOMY (Bilateral) SENTINEL NODE DISSECTION  Patient Location: PACU  Anesthesia Type:General  Level of Consciousness: awake, alert  and oriented  Airway & Oxygen Therapy: Patient Spontanous Breathing and Patient connected to face mask  Post-op Assessment: Report given to RN and Post -op Vital signs reviewed and stable  Post vital signs: Reviewed and stable  Last Vitals:  Vitals Value Taken Time  BP 175/90 10/14/21 1104  Temp    Pulse 63 10/14/21 1105  Resp 16 10/14/21 1105  SpO2 96 % 10/14/21 1105  Vitals shown include unvalidated device data.  Last Pain:  Vitals:   10/14/21 0619  TempSrc:   PainSc: 0-No pain         Complications: No notable events documented.

## 2021-10-14 NOTE — Anesthesia Procedure Notes (Signed)
Procedure Name: Intubation Date/Time: 10/14/2021 7:40 AM Performed by: Rosaland Lao, CRNA Pre-anesthesia Checklist: Patient identified, Emergency Drugs available, Suction available and Patient being monitored Patient Re-evaluated:Patient Re-evaluated prior to induction Oxygen Delivery Method: Circle system utilized Preoxygenation: Pre-oxygenation with 100% oxygen Induction Type: IV induction Ventilation: Mask ventilation without difficulty Laryngoscope Size: Miller and 2 Grade View: Grade I Tube type: Oral Tube size: 7.0 mm Number of attempts: 1 Airway Equipment and Method: Stylet Placement Confirmation: ETT inserted through vocal cords under direct vision, positive ETCO2 and breath sounds checked- equal and bilateral Secured at: 21 cm Tube secured with: Tape Dental Injury: Teeth and Oropharynx as per pre-operative assessment

## 2021-10-14 NOTE — Anesthesia Postprocedure Evaluation (Signed)
Anesthesia Post Note  Patient: Kristin Carlson  Procedure(s) Performed: XI ROBOTIC ASSISTED TOTAL HYSTERECTOMY WITH BILATERAL SALPINGO OOPHORECTOMY, LYMPH NODE DISSECTION,  LAPAROTOMY (Bilateral) SENTINEL NODE DISSECTION     Patient location during evaluation: PACU Anesthesia Type: General Level of consciousness: sedated and patient cooperative Pain management: pain level controlled Vital Signs Assessment: post-procedure vital signs reviewed and stable Respiratory status: spontaneous breathing Cardiovascular status: stable Anesthetic complications: no   No notable events documented.  Last Vitals:  Vitals:   10/14/21 1421 10/14/21 1446  BP:  (!) 150/89  Pulse: 69   Resp: (!) 21   Temp:  (!) 36.3 C  SpO2: 100% 96%    Last Pain:  Vitals:   10/14/21 1446  TempSrc: Oral  PainSc:                  Nolon Nations

## 2021-10-14 NOTE — Op Note (Signed)
OPERATIVE NOTE  Pre-operative Diagnosis: endometrial cancer grade 3  Post-operative Diagnosis: same  Operation: Robotic-assisted laparoscopic total hysterectomy with bilateral salpingo-oophorectomy, SLN biopsy on the right, failed mapping on the left with pelvic and para-aortic lymph node dissection on the left, peritoneal nodule biopsy, mini-lap for specimen delivery   Surgeon: Jeral Pinch MD  Assistant Surgeon: Lahoma Crocker MD (an MD assistant was necessary for tissue manipulation, management of robotic instrumentation, retraction and positioning due to the complexity of the case and hospital policies).   Anesthesia: GET  Urine Output: 1000cc  Operative Findings: On EUA, 8cm bulbous uterus. ON intra-abdominal entry, normal upper abdominal survey. Normal omentum, small and large bowel. Uterus 8-10cm and bulbous. Normal appearing adnexa. Mapping successful on the right, no mapping on the left. Some mildly prominent lymph nodes bilaterally in the pelvis. Small, <5 mm, nodule in the cul de sac. No obvious intra-abdominal or pelvic evidence of disease.  Estimated Blood Loss:  less than 100 mL      Total IV Fluids: see I&O flowsheet         Specimens: uterus, cervix, bilateral tubes and ovaries, right obturator and external iliac SLNs, left pelvic and par-aortic lymph nodes, posterior cul de sac peritoneal nodule, pelvic washings         Complications:  None apparent; patient tolerated the procedure well.         Disposition: PACU - hemodynamically stable.  Procedure Details  The patient was seen in the Holding Room. The risks, benefits, complications, treatment options, and expected outcomes were discussed with the patient.  The patient concurred with the proposed plan, giving informed consent.  The site of surgery properly noted/marked. The patient was identified as Janayah Zavada and the procedure verified as a Robotic-assisted hysterectomy with bilateral salpingo oophorectomy  with SLN biopsy.   After induction of anesthesia, the patient was draped and prepped in the usual sterile manner. Patient was placed in supine position after anesthesia and draped and prepped in the usual sterile manner as follows: Her arms were tucked to her side with all appropriate precautions.  The shoulders were stabilized with padded shoulder blocks applied to the acromium processes.  The patient was placed in the semi-lithotomy position in [place].  The perineum and vagina were prepped with CholoraPrep. The patient was draped after the CholoraPrep had been allowed to dry for 3 minutes.  A Time Out was held and the above information confirmed.  The urethra was prepped with Betadine. Foley catheter was placed.  A sterile speculum was placed in the vagina.  The cervix was grasped with a single-tooth tenaculum. 2mg  total of ICG was injected into the cervical stroma at 2 and 9 o'clock with 1cc injected at a 1cm and 35mm depth (concentration 0.5mg /ml) in all locations. The cervix was dilated with Kennon Rounds dilators.  The ZUMI uterine manipulator with a small colpotomizer ring was placed without difficulty.  A pneum occluder balloon was placed over the manipulator.  OG tube placement was confirmed and to suction.   Next, a 10 mm skin incision was made 1 cm below the subcostal margin in the midclavicular line.  The 5 mm Optiview port and scope was used for direct entry.  Opening pressure was under 10 mm CO2.  The abdomen was insufflated and the findings were noted as above.   At this point and all points during the procedure, the patient's intra-abdominal pressure did not exceed 15 mmHg. Next, an 8 mm skin incision was made superior to  the umbilicus and a right and left port were placed about 8 cm lateral to the robot port on the right and left side.  A fourth arm was placed on the right.  The 5 mm assist trocar was exchanged for a 10-12 mm port. All ports were placed under direct visualization.  The patient  was placed in steep Trendelenburg.  Bowel was folded away into the upper abdomen.  The robot was docked in the normal manner.  The right and left peritoneum were opened parallel to the IP ligament to open the retroperitoneal spaces bilaterally. The round ligaments were transected. The SLN mapping was performed in bilateral pelvic basins. After identifying the ureters, the para rectal and paravesical spaces were opened up entirely with careful dissection below the level of the ureters bilaterally and to the depth of the uterine artery origin in order to skeletonize the uterine "web" and ensure visualization of all parametrial channels. The para-aortic basins were carefully exposed and evaluated for isolated para-aortic SLN's. Lymphatic channels were identified travelling to the following visualized sentinel lymph node's: external iliac and obturator SLN on the right. These SLN's were separated from their surrounding lymphatic tissue, removed and sent for permanent pathology. No mapping was noted along the left  The hysterectomy was started.  The ureter was again noted to be on the medial leaf of the broad ligament.  The peritoneum above the ureter was incised and stretched and the infundibulopelvic ligament was skeletonized, cauterized and cut.  The posterior peritoneum was taken down to the level of the KOH ring.  The anterior peritoneum was also taken down.  The bladder flap was created to the level of the KOH ring.  The uterine artery on the right side was skeletonized, cauterized and cut in the normal manner.  A similar procedure was performed on the left.  The colpotomy was made and the uterus, cervix, bilateral ovaries and tubes were amputated and placed in a 45mm Endocatch bag inserted through the assist trocar.  Pedicles were inspected and excellent hemostasis was achieved.  A peritoneal nodule noted on the peritoneum just inferior to the cuff was excised with sharp dissection and monopolar  electrocautery.  The colpotomy at the vaginal cuff was closed with Vicryl on a CT1 needle in a running manner.  Irrigation was used and excellent hemostasis was achieved.    The left and right paravesical and pararectal spaces were further opened and performed a left-sided pelvic lymph node dissection with the following borders: proximally the bifurcation of the common iliac, distally the circumflex iliac vein, laterally the genitofemoral nerve, the medial border was the superior vesicle artery and the deep border was the obturator nerve. All lymphatic tissue was removed and sent to Pathology. A left para-aortic lymph node dissection was then performed with the following borders: distally the common iliac, medially the aorta, laterally the psoas and the proximal border was just inferior to the IMA. All lymphatic tissue was removed and sent to Pathology. The abdomen and pelvis were then irrigated and all surgical sites found to be hemostatic. Arista was placed within the para-aortic and pelvic lymph node beds on the left.   At this point in the procedure was completed.  Robotic instruments were removed under direct visulaization.  The robot was undocked. The supraumbilical incision was open to a length of 6 cm with a scalpel and the incision opened down to the peritoneum with electrocautery. The peritoneum was bluntly open with the abdomen still insufflated. The uterine specimen was  delivered, in its bag, through the incision. The fascial incision was then closed with running #1 looped PDS tied in the midline. The subcutaneous tissue and all port sites were irrigated with water. Exparel was injected for local anesthesia. The subcutaneous tissue of the mini-lap site was closed with 2-0 Vicryl in running fashion.  The fascia at the 10-12 mm port was closed with 0 Vicryl on a UR-5 needle.  The subcuticular tissue of all incisions was closed with 4-0 Vicryl and the skin was closed with 4-0 Monocryl in a  subcuticular manner.  Dermabond was applied.    The vagina was swabbed with minimal bleeding noted. Foley catheter was removed. All sponge, lap and needle counts were correct x  3.   The patient was transferred to the recovery room in stable condition.  Jeral Pinch, MD

## 2021-10-15 ENCOUNTER — Telehealth: Payer: Self-pay

## 2021-10-15 ENCOUNTER — Encounter (HOSPITAL_COMMUNITY): Payer: Self-pay | Admitting: Gynecologic Oncology

## 2021-10-15 NOTE — Telephone Encounter (Signed)
Attempted to reach patient. Voicemail full unable to leave message.

## 2021-10-15 NOTE — Telephone Encounter (Signed)
Spoke with Kristin Carlson this morning. She states she is eating, drinking and urinating well. She has not had a BM yet but is passing gas. She is taking senokot as prescribed and encouraged her to drink plenty of water. She denies fever or chills. Incisions are dry and intact. Instructed to remove honeycomb dressing 5 days after surgery (10/19/21). She rates her pain as 2-3/10. Pain is controlled with Ibuprofen and tylenol.   Instructed to call office with any fever, chills, purulent drainage, uncontrolled pain or any other questions or concerns. Patient verbalizes understanding.   Pt aware of post op appointments as well as the office number [phone] and after hours number [phone] to call if she has any questions or concerns

## 2021-10-16 LAB — CYTOLOGY - NON PAP

## 2021-10-17 ENCOUNTER — Inpatient Hospital Stay: Payer: Medicare Other | Attending: Gynecologic Oncology | Admitting: Gynecologic Oncology

## 2021-10-17 ENCOUNTER — Encounter: Payer: Self-pay | Admitting: Gynecologic Oncology

## 2021-10-17 DIAGNOSIS — Z7189 Other specified counseling: Secondary | ICD-10-CM

## 2021-10-17 DIAGNOSIS — Z90722 Acquired absence of ovaries, bilateral: Secondary | ICD-10-CM

## 2021-10-17 DIAGNOSIS — C541 Malignant neoplasm of endometrium: Secondary | ICD-10-CM

## 2021-10-17 DIAGNOSIS — Z90710 Acquired absence of both cervix and uterus: Secondary | ICD-10-CM

## 2021-10-17 NOTE — Progress Notes (Addendum)
Gynecologic Oncology Telehealth Consult Note: Gyn-Onc  I connected with Kristin Carlson on 10/17/21 at  4:20 PM EST by telephone and verified that I am speaking with the correct person using two identifiers.  I discussed the limitations, risks, security and privacy concerns of performing an evaluation and management service by telemedicine and the availability of in-person appointments. I also discussed with the patient that there may be a patient responsible charge related to this service. The patient expressed understanding and agreed to proceed.  Other persons participating in the visit and their role in the encounter: none.  Patient's location: home Provider's location: [facility]  Reason for Visit: follow-up after surgery, treatment recommendations  Treatment History: Oncology History  Endometrial cancer ([place])  09/11/2021 Initial Biopsy   EMB: gr 3 endometrial cancer, favor endometrioid   09/22/2021 Initial Diagnosis   Endometrial cancer ([place])   09/29/2021 Imaging   CT C/A/P: 1. Evaluation of the pelvis is significantly limited by dense metallic streak artifact from adjacent hip arthroplasty. Within this limitation, there is expansile, masslike, heterogeneously enhancing appearance of the endometrium, measuring at least 5.2 cm in thickness. Findings are in keeping with reported diagnosis of endometrial malignancy. 2. No evidence of lymphadenopathy or metastatic disease in the chest, abdomen, or pelvis. 3. There is a 0.4 cm fissural nodule the superior segment left lower lobe, almost certainly a benign intrapulmonary lymph node. Attention on follow-up. 4. Coronary artery disease.   10/14/2021 Surgery   TRH/BSO, right SLN biopsy, left pelvic and PA LND, peritoneal nodule biopsy, mini-lap for specimen delivery  Findings: On EUA, 8cm bulbous uterus. ON intra-abdominal entry, normal upper abdominal survey. Normal omentum, small and large bowel. Uterus 8-10cm and bulbous. Normal  appearing adnexa. Mapping successful on the right, no mapping on the left. Some mildly prominent lymph nodes bilaterally in the pelvis. Small, <5 mm, nodule in the cul de sac. No obvious intra-abdominal or pelvic evidence of disease.   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 Having some pain, much improved from last couple of days now that bowels are moving. Using just tylenol and ibuprofen today. Voiding without difficulty. Minimal vaginal spotting after surgery. Tolerating PO without nausea or emesis.   Past Medical/Surgical History: Past Medical History:  Diagnosis Date   Actinic keratosis 06/04/2021   right anteromedial thigh   Arthritis    Basal cell carcinoma 06/04/2021   right suprapubic   Basal cell carcinoma 07/29/2021   left nasal ala. atypical basaloid cells   GERD (gastroesophageal reflux disease)    History of basal cell carcinoma 07/29/2021   left thigh, EDC at time of bx   HLD (hyperlipidemia)    Hypertension    Hypothyroidism    Pneumonia    HX OF YEARS AGO   PONV (postoperative nausea and vomiting)     Past Surgical History:  Procedure Laterality Date   cataract Bilateral    HIP SURGERY     KNEE ARTHROSCOPY Left 2003   NECK SURGERY  1997   Fusion C3, C4, C5   ROBOTIC ASSISTED TOTAL HYSTERECTOMY WITH BILATERAL SALPINGO OOPHERECTOMY Bilateral 10/14/2021   Procedure: XI ROBOTIC ASSISTED TOTAL HYSTERECTOMY WITH BILATERAL SALPINGO OOPHORECTOMY, LYMPH NODE DISSECTION,  LAPAROTOMY;  Surgeon: Lafonda Mosses, MD;  Location: [facility];  Service: Gynecology;  Laterality: Bilateral;   SENTINEL NODE BIOPSY N/A 10/14/2021   Procedure: SENTINEL NODE DISSECTION;  Surgeon: Lafonda Mosses, MD;  Location: [facility];  Service:  Gynecology;  Laterality: N/A;   THYROIDECTOMY  2007   TOTAL HIP ARTHROPLASTY Bilateral    Left Hip -2015, Right Hip 208    Family History  Problem Relation Age of Onset   Cancer Father        Lung Cancer   Skin cancer Maternal Grandmother    Skin cancer Paternal Grandmother    Colon cancer Neg Hx    Breast cancer Neg Hx    Ovarian cancer Neg Hx    Endometrial cancer Neg Hx    Pancreatic cancer Neg Hx    Prostate cancer Neg Hx     Social History   Socioeconomic History   Marital status: Married    Spouse name: Not on file   Number of children: Not on file   Years of education: Not on file   Highest education level: Not on file  Occupational History   Not on file  Tobacco Use   Smoking status: Former    Types: Cigarettes   Smokeless tobacco: Never  Vaping Use   Vaping Use: Never used  Substance and Sexual Activity   Alcohol use: Yes    Comment: RARE   Drug use: Never   Sexual activity: Not Currently  Other Topics Concern   Not on file  Social History Narrative   Not on file   Social Determinants of Health   Financial Resource Strain: Not on file  Food Insecurity: Not on file  Transportation Needs: Not on file  Physical Activity: Not on file  Stress: Not on file  Social Connections: Not on file    Current Medications:  Current Outpatient Medications:    Cholecalciferol 50 MCG (2000 UT) CAPS, Take 2,000 Units by mouth daily., Disp: , Rfl:    clindamycin (CLEOCIN) 150 MG capsule, Take 1,200 mg by mouth once., Disp: , Rfl:    HYDROmorphone (DILAUDID) 2 MG tablet, Take 1 tablet (2 mg total) by mouth every 6 (six) hours as needed for severe pain. For AFTER surgery, do not take and drive, Disp: 10 tablet, Rfl: 0   levothyroxine (SYNTHROID) 137 MCG tablet, Take 137 mcg by mouth daily before breakfast., Disp: , Rfl:    magnesium oxide (MAG-OX) 400 MG tablet, Take 400 mg by mouth daily., Disp: , Rfl:    Multiple Vitamins-Minerals (MULTIVITAMIN WOMEN 50+ PO), Take 1  tablet by mouth daily at 2 am., Disp: , Rfl:    Omega-3 1000 MG CAPS, Take 1,000-2,000 mg by mouth See admin instructions. Take 2000 mg in the morning 10000 at bedtime, Disp: , Rfl:    pantoprazole (PROTONIX) 20 MG tablet, Take 1 tablet by mouth daily as needed for heartburn or indigestion., Disp: , Rfl:    promethazine (PHENERGAN) 12.5 MG tablet, Take 1 tablet (12.5 mg total) by mouth every 6 (six) hours as needed for nausea or vomiting. For AFTER surgery only, Disp: 10 tablet, Rfl: 0   propranolol (INDERAL) 20 MG tablet, Take 40 mg by mouth 2 (two) times daily., Disp: , Rfl:    rosuvastatin (CRESTOR) 5 MG tablet, Take 5 mg by mouth daily., Disp: , Rfl:    senna-docusate (SENOKOT-S) 8.6-50 MG tablet, Take 2 tablets by mouth at bedtime. For AFTER surgery, do not take if having diarrhea, Disp: 30 tablet, Rfl: 0   temazepam (RESTORIL) 15 MG capsule,  Take 15 mg by mouth at bedtime as needed for sleep., Disp: , Rfl:   Review of Symptoms: Pertinent positives as er HPI.  Physical Exam: There were no vitals taken for this visit. Deferred given limitations of phone visit.  Laboratory & Radiologic Studies: A. SENTINEL LYMPH NODE, RIGHT EXTERNAL ILIAC, BIOPSY:  - Lymph node, negative for carcinoma (0/1)   B. SENTINEL LYMPH NODE, RIGHT OBTURATOR AND SURROUNDING LYMPH NODES,  BIOPSY:  - Lymph nodes, negative for carcinoma (0/5)   C. UTERUS, CERVIX, BILATERAL FALLOPIAN TUBES AND OVARIES:  - Invasive high-grade serous carcinoma  - Carcinoma focally invades into the serosal surface  - Lymphovascular invasion is present  - Benign unremarkable cervix  - Benign unremarkable bilateral fallopian tubes and ovaries  - See oncology table   D. CUL DE SAC NODULE, POSTERIOR, BIOPSY:  - Endometriosis   E. LYMPH NODE, LEFT PELVIC, BIOPSY:  - Lymph nodes, negative for carcinoma (0/3)   F. LYMPH NODE, LEFT PARAORTIC, BIOPSY:  - Lymph nodes, negative for carcinoma (0/5)       ONCOLOGY TABLE:    UTERUS, CARCINOMA OR CARCINOSARCOMA: Resection   Procedure: Total hysterectomy and bilateral salpingo-oophorectomy  Histologic Type: Serous carcinoma  Histologic Grade: High-grade  Myometrial Invasion:       Depth of Myometrial Invasion (mm): 32 mm       Myometrial Thickness (mm): 32 mm       Percentage of Myometrial Invasion: 100%  Uterine Serosa Involvement: Present, focal  Cervical stromal Involvement: Not identified  Extent of involvement of other tissue/organs: Not identified  Peritoneal/Ascitic Fluid: Negative for carcinoma  Lymphovascular Invasion: Present  Regional Lymph Nodes:       Pelvic Lymph Nodes Examined:                                   6 Sentinel                                   3 Non-sentinel                                   9 Total       Pelvic Lymph Nodes with Metastasis: 0                           Macrometastasis: (>2.0 mm): 0                           Micrometastasis: (>0.2 mm and < 2.0 mm): 0                           Isolated Tumor Cells (<0.2 mm): 0                           Laterality of Lymph Node with Tumor: Not  applicable                           Extracapsular Extension: Not applicable       Para-aortic Lymph Nodes Examined:  0 Sentinel                                    5 Non-sentinel                                    5 Total       Para-aortic Lymph Nodes with Metastasis: 0                           Macrometastasis: (>2.0 mm): 0                           Micrometastasis:  (>0.2 mm and < 2.0 mm): 0                           Isolated Tumor Cells (<0.2 mm): 0                           Laterality of Lymph Node with Tumor: Not  applicable                           Extracapsular Extension: Not applicable  Distant Metastasis:       Distant Site(s) Involved: Not applicable  Pathologic Stage Classification (pTNM, AJCC 8th Edition): pT3a, pN0  Ancillary Studies: MMR / MSI testing will be ordered  Representative  Tumor Block: C4  Comment(s): Pancytokeratin was performed on the lymph nodes and is  negative.   Assessment & Plan: Kristin Carlson is a 80 y.o. 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  Patient meeting postoperative milestones. Discussed continued expectations and restrictions.  Reviewed pathology report in detail as well as findings from surgery. Given high risk cancer with significant uterine involvement, LVI and focal serosal involvement, I recommend adjuvant therapy with systemic chemotherapy and vaginal brachytherapy. Will present on Monday at tumor board and let patient know to expect call from Kristin Carlson after our conference to help facilitate scheduling consultations with radiation and medical oncology.  I discussed the assessment and treatment plan with the patient. The patient was provided with an opportunity to ask questions and all were answered. The patient agreed with the plan and demonstrated an understanding of the instructions.   The patient was advised to call back or see an in-person evaluation if the symptoms worsen or if the condition fails to improve as anticipated.   18 minutes of total time was spent for this patient encounter, including preparation, face-to-face counseling with the patient and coordination of care, and documentation of the encounter.   Kristin Pinch, MD  Division of Gynecologic Oncology  Department of Obstetrics and Gynecology  [facility] [place]

## 2021-10-20 ENCOUNTER — Telehealth: Payer: Self-pay | Admitting: Oncology

## 2021-10-20 ENCOUNTER — Other Ambulatory Visit: Payer: Self-pay | Admitting: Oncology

## 2021-10-20 DIAGNOSIS — C541 Malignant neoplasm of endometrium: Secondary | ICD-10-CM

## 2021-10-20 DIAGNOSIS — N6489 Other specified disorders of breast: Secondary | ICD-10-CM

## 2021-10-20 NOTE — Progress Notes (Signed)
Gynecologic Oncology Multi-Disciplinary Disposition Conference Note  Date of the Conference: 10/20/2021  Patient Name: Kristin Carlson  Referring Provider: Dr. Willis Modena Primary GYN Oncologist: Dr. 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 Disposition is to chemotherapy and vaginal brachytherapy. Also recommendation for a diagnostic mammogram of the left breast after review of CT from 09/29/2021 during conference that shows a focal nodular asymmetry with slight hyperenhancement in the outer left breast.   This Multidisciplinary conference took place involving physicians from [place], Medical Oncology, Radiation Oncology, Pathology, Radiology along with the Gynecologic Oncology Nurse Practitioner and RN.  Comprehensive assessment of the patient's malignancy, staging, need for surgery, chemotherapy, radiation therapy, and need for further testing were reviewed. Supportive measures, both inpatient and following discharge were also discussed. The recommended plan of care is documented. Greater than 35 minutes were spent correlating and coordinating this patient's care.

## 2021-10-20 NOTE — Telephone Encounter (Signed)
Called [facility] and introduced myself as the Advice worker.  Went over treatment plan for chemotherapy and vaginal brachytherapy.  Also discussed recommendation from [facility] this morning for a diagnostic mammogram of her left breast after review of her last CT scan.    Went over upcoming appointment and advised I will call her with the mammogram appointment this afternoon.  She verbalized understanding of appointments and plan.

## 2021-10-20 NOTE — Telephone Encounter (Signed)
Left a message with diagnostic mammogram appointment on 11/06/21 at 9:20 at the [facility].

## 2021-10-20 NOTE — Addendum Note (Signed)
Addended by: Elmo Putt R on: 10/20/2021 08:16 AM   Modules accepted: Orders

## 2021-10-21 ENCOUNTER — Telehealth: Payer: Medicare Other | Admitting: Gynecologic Oncology

## 2021-10-27 ENCOUNTER — Telehealth: Payer: Self-pay | Admitting: *Deleted

## 2021-10-27 NOTE — Telephone Encounter (Signed)
Following up with Kristin Carlson patient reports vaginal spotting comes and goes. She denies pain, odor, cramping or any heavy lifting. She reports doing a lot of bending to clean her bird cage. She has not noticed if she spots after this but will be more mindful of this and see if she has spotting afterwards. When she does have spotting she reports it as a small streak on her panty liner. Instructed patient to monitor symptoms. If spotting becomes heavier or if she develops any new symptoms to notify our office. Follow up appointment scheduled with Dr. Berline Lopes 11/03/21 at 3:30pm.

## 2021-10-27 NOTE — Telephone Encounter (Signed)
Patient called and stated "I had surgery on 11/15 and about five to six days after started having some discharge and bleeding. Asa Lente 11/21) It comes and goes. There is no odor, pain or cramping. It is any where from bright red to light pink in color. I do wear a panty liner; it doesn't become soaked. The discharge/blood is on the liner and when I wipe.I have done no lifting, pulling or anything in the vagina." Explained that the message would be given to [facility] APP and that someone from the office will call her back

## 2021-10-28 ENCOUNTER — Encounter: Payer: Self-pay | Admitting: Oncology

## 2021-10-28 ENCOUNTER — Inpatient Hospital Stay (HOSPITAL_BASED_OUTPATIENT_CLINIC_OR_DEPARTMENT_OTHER): Payer: Medicare Other | Admitting: Hematology and Oncology

## 2021-10-28 ENCOUNTER — Other Ambulatory Visit: Payer: Self-pay | Admitting: Hematology and Oncology

## 2021-10-28 ENCOUNTER — Inpatient Hospital Stay: Payer: Medicare Other | Attending: Gynecologic Oncology

## 2021-10-28 ENCOUNTER — Encounter: Payer: Self-pay | Admitting: Hematology and Oncology

## 2021-10-28 ENCOUNTER — Other Ambulatory Visit: Payer: Self-pay

## 2021-10-28 VITALS — BP 165/83 | HR 67 | Temp 98.4°F | Resp 18 | Ht 63.0 in | Wt 178.8 lb

## 2021-10-28 DIAGNOSIS — E039 Hypothyroidism, unspecified: Secondary | ICD-10-CM

## 2021-10-28 DIAGNOSIS — C541 Malignant neoplasm of endometrium: Secondary | ICD-10-CM | POA: Diagnosis present

## 2021-10-28 MED ORDER — LIDOCAINE-PRILOCAINE 2.5-2.5 % EX CREA: TOPICAL_CREAM | CUTANEOUS | 3 refills | Status: DC

## 2021-10-28 MED ORDER — PROCHLORPERAZINE MALEATE 10 MG PO TABS: 10.0000 mg | ORAL_TABLET | Freq: Four times a day (QID) | ORAL | 1 refills | Status: DC | PRN

## 2021-10-28 MED ORDER — DEXAMETHASONE 4 MG PO TABS: ORAL_TABLET | ORAL | 6 refills | Status: DC

## 2021-10-28 MED ORDER — ONDANSETRON HCL 8 MG PO TABS: 8.0000 mg | ORAL_TABLET | Freq: Three times a day (TID) | ORAL | 1 refills | Status: DC | PRN

## 2021-10-28 NOTE — Progress Notes (Signed)
START ON PATHWAY REGIMEN - Uterine     A cycle is every 21 days:     Paclitaxel      Carboplatin   **Always confirm dose/schedule in your pharmacy ordering system**  Patient Characteristics: Serous Carcinoma, Newly Diagnosed, Postoperative (Pathologic Staging), Postoperative Histology: Serous Carcinoma Therapeutic Status: Newly Diagnosed, Postoperative (Pathologic Staging) AJCC M Category: cM0 AJCC 8 Stage Grouping: III AJCC T Category: pT3 AJCC N Category: pN0 Intent of Therapy: Curative Intent, Discussed with Patient

## 2021-10-28 NOTE — Progress Notes (Signed)
Met with Kristin Carlson after her appointment with Dr. Alvy Bimler.  Provided her with the [place] folder and encouraged her to call with any questions or needs.

## 2021-10-28 NOTE — Progress Notes (Signed)
Requested Her2 testing on accession [phone] with [facility] Pathology via email.

## 2021-10-29 ENCOUNTER — Telehealth: Payer: Self-pay | Admitting: *Deleted

## 2021-10-29 ENCOUNTER — Encounter: Payer: Self-pay | Admitting: Hematology and Oncology

## 2021-10-29 NOTE — Telephone Encounter (Signed)
Called pt to make aware of appt at [facility] IR for port placement. Gave instructions for npo after midnight, only sips of water to take meds, has to have driver and also someone to stay with pt for 24 hrs post-op. Pt verbalized understanding. Also gave number to [place] at IR if she had further questions.

## 2021-10-29 NOTE — Assessment & Plan Note (Signed)
We reviewed the NCCN guidelines We discussed the role of chemotherapy. The intent is of curative intent.  We discussed some of the risks, benefits, side-effects of carboplatin & Taxol. Treatment is intravenous, every 3 weeks x 6 cycles  Some of the short term side-effects included, though not limited to, including weight loss, life threatening infections, risk of allergic reactions, need for transfusions of blood products, nausea, vomiting, change in bowel habits, loss of hair, admission to hospital for various reasons, and risks of death.   Long term side-effects are also discussed including risks of infertility, permanent damage to nerve function, hearing loss, chronic fatigue, kidney damage with possibility needing hemodialysis, and rare secondary malignancy including bone marrow disorders.  The patient is aware that the response rates discussed earlier is not guaranteed.  After a long discussion, patient made an informed decision to proceed with the prescribed plan of care.   Patient education material was dispensed. We discussed premedication with dexamethasone before chemotherapy. We discussed hair preservation; I gave her prescription for cranial prosthesis She will attend chemo education class and I will order for port placement We agreed to start her treatment around middle of December and I will see her prior to treatment

## 2021-10-29 NOTE — Progress Notes (Signed)
[facility] CONSULT NOTE  Patient Care Team: [name], FNP as PCP - General (Nurse Practitioner) [name], RN as Oncology Nurse Navigator (Oncology)  ASSESSMENT & PLAN:  Endometrial cancer [facility]) We reviewed the NCCN guidelines We discussed the role of chemotherapy. The intent is of curative intent.  We discussed some of the risks, benefits, side-effects of carboplatin & Taxol. Treatment is intravenous, every 3 weeks x 6 cycles  Some of the short term side-effects included, though not limited to, including weight loss, life threatening infections, risk of allergic reactions, need for transfusions of blood products, nausea, vomiting, change in bowel habits, loss of hair, admission to hospital for various reasons, and risks of death.   Long term side-effects are also discussed including risks of infertility, permanent damage to nerve function, hearing loss, chronic fatigue, kidney damage with possibility needing hemodialysis, and rare secondary malignancy including bone marrow disorders.  The patient is aware that the response rates discussed earlier is not guaranteed.  After a long discussion, patient made an informed decision to proceed with the prescribed plan of care.   Patient education material was dispensed. We discussed premedication with dexamethasone before chemotherapy. We discussed hair preservation; I gave her prescription for cranial prosthesis She will attend chemo education class and I will order for port placement We agreed to start her treatment around middle of December and I will see her prior to treatment  Orders Placed This Encounter  Procedures   IR IMAGING GUIDED PORT INSERTION    Standing Status:   Future    Standing Expiration Date:   10/28/2022    Order Specific Question:   Reason for Exam (SYMPTOM  OR DIAGNOSIS REQUIRED)    Answer:   need port for chemo to start on 12/12    Order Specific Question:   Preferred Imaging Location?     Answer:   [facility]   CBC with Differential ([place] Only)    Standing Status:   Standing    Number of Occurrences:   20    Standing Expiration Date:   10/28/2022   CMP ([place] only)    Standing Status:   Standing    Number of Occurrences:   20    Standing Expiration Date:   10/28/2022    The total time spent in the appointment was 60 minutes encounter with patients including review of chart and various tests results, discussions about plan of care and coordination of care plan   All questions were answered. The patient knows to call the clinic with any problems, questions or concerns. No barriers to learning was detected.  Heath Lark, MD 11/30/202210:14 AM  CHIEF COMPLAINTS/PURPOSE OF CONSULTATION:  Urine cancer, for further management  HISTORY OF PRESENTING ILLNESS:  Kristin Carlson 80 y.o. female is here because of recent diagnosis of uterine cancer She is here accompanied by her husband, Herbie [place] She is a retired Marine scientist She has 1 son and 1 daughter She presented initially with postmenopausal bleeding leading to further work-up including endometrial biopsy, CT scan and subsequent surgery She is doing well after surgery except for irregular bowel habits with occasional straining and sensation of frequent urination but denies dysuria  I have reviewed her chart and materials related to her cancer extensively and collaborated history with the patient. Summary of oncologic history is as follows: Oncology History Overview Note  MMR IHC intact High grade serous   Endometrial cancer ([place])  09/11/2021 Initial Biopsy   EMB: gr  3 endometrial cancer, favor endometrioid   09/22/2021 Initial Diagnosis   Endometrial cancer ([place])   09/29/2021 Imaging   CT C/A/P: 1. Evaluation of the pelvis is significantly limited by dense metallic streak artifact from adjacent hip arthroplasty. Within this limitation, there is expansile, masslike, heterogeneously enhancing appearance  of the endometrium, measuring at least 5.2 cm in thickness. Findings are in keeping with reported diagnosis of endometrial malignancy. 2. No evidence of lymphadenopathy or metastatic disease in the chest, abdomen, or pelvis. 3. There is a 0.4 cm fissural nodule the superior segment left lower lobe, almost certainly a benign intrapulmonary lymph node. Attention on follow-up. 4. Coronary artery disease.   10/14/2021 Surgery   TRH/BSO, right SLN biopsy, left pelvic and PA LND, peritoneal nodule biopsy, mini-lap for specimen delivery  Findings: On EUA, 8cm bulbous uterus. ON intra-abdominal entry, normal upper abdominal survey. Normal omentum, small and large bowel. Uterus 8-10cm and bulbous. Normal appearing adnexa. Mapping successful on the right, no mapping on the left. Some mildly prominent lymph nodes bilaterally in the pelvis. Small, <5 mm, nodule in the cul de sac. No obvious intra-abdominal or pelvic evidence of disease.   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 SENTINEL LYMPH NODE, RIGHT EXTERNAL ILIAC, BIOPSY:  - Lymph node, negative for carcinoma (0/1)   B. SENTINEL LYMPH NODE, RIGHT OBTURATOR AND SURROUNDING LYMPH NODES,  BIOPSY:  - Lymph nodes, negative for carcinoma (0/5)   C. UTERUS, CERVIX, BILATERAL FALLOPIAN TUBES AND OVARIES:  - Invasive high-grade serous carcinoma  - Carcinoma focally invades into the serosal surface  - Lymphovascular invasion is present  - Benign unremarkable cervix  - Benign unremarkable bilateral fallopian tubes and ovaries  - See oncology table   D. CUL DE SAC NODULE, POSTERIOR, BIOPSY:  - Endometriosis   E. LYMPH NODE, LEFT PELVIC, BIOPSY:  - Lymph nodes, negative for carcinoma (0/3)   F. LYMPH NODE, LEFT  PARAORTIC, BIOPSY:  - Lymph nodes, negative for carcinoma (0/5)   ONCOLOGY TABLE:   UTERUS, CARCINOMA OR CARCINOSARCOMA: Resection   Procedure: Total hysterectomy and bilateral salpingo-oophorectomy  Histologic Type: Serous carcinoma  Histologic Grade: High-grade  Myometrial Invasion:       Depth of Myometrial Invasion (mm): 32 mm       Myometrial Thickness (mm): 32 mm       Percentage of Myometrial Invasion: 100%  Uterine Serosa Involvement: Present, focal  Cervical stromal Involvement: Not identified  Extent of involvement of other tissue/organs: Not identified  Peritoneal/Ascitic Fluid: Negative for carcinoma  Lymphovascular Invasion: Present  Regional Lymph Nodes:       Pelvic Lymph Nodes Examined:                                   6 Sentinel                                   3 Non-sentinel                                   9 Total  Pelvic Lymph Nodes with Metastasis: 0                           Macrometastasis: (>2.0 mm): 0                           Micrometastasis: (>0.2 mm and < 2.0 mm): 0                           Isolated Tumor Cells (<0.2 mm): 0                           Laterality of Lymph Node with Tumor: Not  applicable                           Extracapsular Extension: Not applicable       Para-aortic Lymph Nodes Examined:                                    0 Sentinel                                    5 Non-sentinel                                    5 Total       Para-aortic Lymph Nodes with Metastasis: 0                           Macrometastasis: (>2.0 mm): 0                           Micrometastasis:  (>0.2 mm and < 2.0 mm): 0                           Isolated Tumor Cells (<0.2 mm): 0                           Laterality of Lymph Node with Tumor: Not  applicable                           Extracapsular Extension: Not applicable  Distant Metastasis:       Distant Site(s) Involved: Not applicable  Pathologic Stage Classification (pTNM, AJCC 8th  Edition): pT3a, pN0  Ancillary Studies: MMR / MSI testing will be ordered  Representative Tumor Block: C4  Comment(s): Pancytokeratin was performed on the lymph nodes and is  negative.      10/28/2021 Cancer Staging   Staging form: Corpus Uteri - Carcinoma and Carcinosarcoma, AJCC 8th Edition - Pathologic stage from 10/28/2021: Stage III (pT3, pN0, cM0) - Signed by Heath Lark, MD on 10/28/2021 Stage prefix: Initial diagnosis    11/11/2021 -  Chemotherapy   Patient is on Treatment Plan : UTERINE Carboplatin AUC 6 / Paclitaxel q21d       MEDICAL HISTORY:  Past Medical History:  Diagnosis Date   Actinic keratosis 06/04/2021  right anteromedial thigh   Arthritis    Basal cell carcinoma 06/04/2021   right suprapubic   Basal cell carcinoma 07/29/2021   left nasal ala. atypical basaloid cells   GERD (gastroesophageal reflux disease)    History of basal cell carcinoma 07/29/2021   left thigh, EDC at time of bx   HLD (hyperlipidemia)    Hypertension    Hypothyroidism    Pneumonia    HX OF YEARS AGO   PONV (postoperative nausea and vomiting)     SURGICAL HISTORY: Past Surgical History:  Procedure Laterality Date   cataract Bilateral    HIP SURGERY     KNEE ARTHROSCOPY Left 2003   [place]   Fusion C3, C4, C5   ROBOTIC ASSISTED TOTAL HYSTERECTOMY WITH BILATERAL SALPINGO OOPHERECTOMY Bilateral 10/14/2021   Procedure: XI ROBOTIC ASSISTED TOTAL HYSTERECTOMY WITH BILATERAL SALPINGO OOPHORECTOMY, LYMPH NODE DISSECTION,  LAPAROTOMY;  Surgeon: Lafonda Mosses, MD;  Location: [facility] ORS;  Service: Gynecology;  Laterality: Bilateral;   SENTINEL NODE BIOPSY N/A 10/14/2021   Procedure: SENTINEL NODE DISSECTION;  Surgeon: Lafonda Mosses, MD;  Location: [facility] ORS;  Service: Gynecology;  Laterality: N/A;   THYROIDECTOMY  2007   TOTAL HIP ARTHROPLASTY Bilateral    Left Hip -2015, Right Hip 208    SOCIAL HISTORY: Social History   Socioeconomic History   Marital status:  Married    Spouse name: Herbie [place]   Number of children: 2   Years of education: Not on file   Highest education level: Not on file  Occupational History   Not on file  Tobacco Use   Smoking status: Former    Types: Cigarettes   Smokeless tobacco: Never  Vaping Use   Vaping Use: Never used  Substance and Sexual Activity   Alcohol use: Yes    Comment: RARE   Drug use: Never   Sexual activity: Not Currently  Other Topics Concern   Not on file  Social History Narrative   Retired Therapist, sports   Social Determinants of Radio broadcast assistant Strain: Not on Art therapist Insecurity: Not on file  Transportation Needs: Not on file  Physical Activity: Not on file  Stress: Not on file  Social Connections: Not on file  Intimate Partner Violence: Not on file    FAMILY HISTORY: Family History  Problem Relation Age of Onset   Cancer Father        Lung Cancer   Skin cancer Maternal Grandmother    Skin cancer Paternal Grandmother    Colon cancer Neg Hx    Breast cancer Neg Hx    Ovarian cancer Neg Hx    Endometrial cancer Neg Hx    Pancreatic cancer Neg Hx    Prostate cancer Neg Hx     ALLERGIES:  is allergic to lactose, codeine, other, penicillins, and sulfa antibiotics.  MEDICATIONS:  Current Outpatient Medications  Medication Sig Dispense Refill   dexamethasone (DECADRON) 4 MG tablet Take 2 tabs at the night before and 2 tab the morning of chemotherapy, every 3 weeks, by mouth x 6 cycles 36 tablet 6   Cholecalciferol 50 MCG (2000 UT) CAPS Take 2,000 Units by mouth daily.     clindamycin (CLEOCIN) 150 MG capsule Take 1,200 mg by mouth once.     levothyroxine (SYNTHROID) 137 MCG tablet Take 137 mcg by mouth daily before breakfast.     lidocaine-prilocaine (EMLA) cream Apply to affected area once 30 g 3   magnesium oxide (MAG-OX)  400 MG tablet Take 400 mg by mouth daily.     Multiple Vitamins-Minerals (MULTIVITAMIN WOMEN 50+ PO) Take 1 tablet by mouth daily at 2 am.     Omega-3 1000  MG CAPS Take 1,000-2,000 mg by mouth See admin instructions. Take 2000 mg in the morning 10000 at bedtime     ondansetron (ZOFRAN) 8 MG tablet Take 1 tablet (8 mg total) by mouth every 8 (eight) hours as needed. 30 tablet 1   pantoprazole (PROTONIX) 20 MG tablet Take 1 tablet by mouth daily as needed for heartburn or indigestion.     prochlorperazine (COMPAZINE) 10 MG tablet Take 1 tablet (10 mg total) by mouth every 6 (six) hours as needed (Nausea or vomiting). 30 tablet 1   propranolol (INDERAL) 20 MG tablet Take 40 mg by mouth 2 (two) times daily.     rosuvastatin (CRESTOR) 5 MG tablet Take 5 mg by mouth daily.     temazepam (RESTORIL) 15 MG capsule Take 15 mg by mouth at bedtime as needed for sleep.     No current facility-administered medications for this visit.    REVIEW OF SYSTEMS:   Constitutional: Denies fevers, chills or abnormal night sweats Eyes: Denies blurriness of vision, double vision or watery eyes Ears, nose, mouth, throat, and face: Denies mucositis or sore throat Respiratory: Denies cough, dyspnea or wheezes Cardiovascular: Denies palpitation, chest discomfort or lower extremity swelling Gastrointestinal:  Denies nausea, heartburn or change in bowel habits Skin: Denies abnormal skin rashes Lymphatics: Denies new lymphadenopathy or easy bruising Neurological:Denies numbness, tingling or new weaknesses Behavioral/Psych: Mood is stable, no new changes  All other systems were reviewed with the patient and are negative.  PHYSICAL EXAMINATION: ECOG PERFORMANCE STATUS: 1 - Symptomatic but completely ambulatory  Vitals:   10/28/21 1244  BP: (!) 165/83  Pulse: 67  Resp: 18  Temp: 98.4 F (36.9 C)  SpO2: 100%   Filed Weights   10/28/21 1244  Weight: 178 lb 12.8 oz (81.1 kg)    GENERAL:alert, no distress and comfortable SKIN: skin color, texture, turgor are normal, no rashes or significant lesions EYES: normal, conjunctiva are pink and non-injected, sclera  clear OROPHARYNX:no exudate, no erythema and lips, buccal mucosa, and tongue normal  NECK: supple, thyroid normal size, non-tender, without nodularity LYMPH:  no palpable lymphadenopathy in the cervical, axillary or inguinal LUNGS: clear to auscultation and percussion with normal breathing effort HEART: regular rate & rhythm and no murmurs and no lower extremity edema ABDOMEN:abdomen soft, non-tender and normal bowel sounds.  Noted well-healed surgical scar Musculoskeletal:no cyanosis of digits and no clubbing  PSYCH: alert & oriented x 3 with fluent speech NEURO: no focal motor/sensory deficits  LABORATORY DATA:  I have reviewed the data as listed Lab Results  Component Value Date   WBC 4.6 10/01/2021   HGB 14.1 10/01/2021   HCT 42.5 10/01/2021   MCV 91.2 10/01/2021   PLT 203 10/01/2021   Recent Labs    09/22/21 1137 10/01/21 1134  NA 140 136  K 4.7 4.6  CL 105 104  CO2 28 26  GLUCOSE 99 99  BUN 19 17  CREATININE 1.01* 0.89  CALCIUM 9.5 9.0  GFRNONAA 57* >60  PROT  --  6.8  ALBUMIN  --  3.7  AST  --  26  ALT  --  20  ALKPHOS  --  54  BILITOT  --  0.7    RADIOGRAPHIC STUDIES: I have personally reviewed the radiological images as listed and  agreed with the findings in the report. CT CHEST ABDOMEN PELVIS W CONTRAST  Addendum Date: 10/20/2021   ADDENDUM REPORT: 10/20/2021 07:26 ADDENDUM: This case was reviewed by Dr. Polly Cobia as part of [place] tumor board on 10/20/21. A focal nodular asymmetry with slight hyperenhancement in the outer left breast on series 2/image 25 is noted. Diagnostic mammography is recommended for further evaluation. These results were discussed by telephone during tumor board on 10/20/2021 at 7:25 am with provider Jeral Pinch , who verbally acknowledged these results. Electronically Signed   By: Ilona Sorrel M.D.   On: 10/20/2021 07:26   Result Date: 10/20/2021 CLINICAL DATA:  New diagnosis uterine cancer staging EXAM: CT CHEST, ABDOMEN, AND  PELVIS WITH CONTRAST TECHNIQUE: Multidetector CT imaging of the chest, abdomen and pelvis was performed following the standard protocol during bolus administration of intravenous contrast. CONTRAST:  30m OMNIPAQUE IOHEXOL 350 MG/ML SOLN, additional oral enteric contrast COMPARISON:  None. FINDINGS: CT CHEST FINDINGS Cardiovascular: Aortic atherosclerosis. Normal heart size. Left coronary artery calcifications no pericardial effusion. Mediastinum/Nodes: No enlarged mediastinal, hilar, or axillary lymph nodes. Thyroid gland, trachea, and esophagus demonstrate no significant findings. Lungs/Pleura: Mild, bandlike scarring of the lungs. 0.4 cm fissural nodule the superior segment left lower lobe (series 4, image 69). No pleural effusion or pneumothorax. Musculoskeletal: No chest wall mass or suspicious bone lesions identified. CT ABDOMEN PELVIS FINDINGS Hepatobiliary: No solid liver abnormality is seen. No gallstones, gallbladder wall thickening, or biliary dilatation. Pancreas: Unremarkable. No pancreatic ductal dilatation or surrounding inflammatory changes. Spleen: Normal in size without significant abnormality. Adrenals/Urinary Tract: Adrenal glands are unremarkable. Kidneys are normal, without renal calculi, solid lesion, or hydronephrosis. Bladder is unremarkable. Stomach/Bowel: Stomach is within normal limits. Appendix is not clearly visualized. No evidence of bowel wall thickening, distention, or inflammatory changes. Sigmoid diverticulosis. Vascular/Lymphatic: Aortic atherosclerosis. No enlarged abdominal or pelvic lymph nodes. Reproductive: Evaluation of the pelvis is significantly limited by dense metallic streak artifact from adjacent hip arthroplasty. Within this limitation, there is expansile, masslike, heterogeneously enhancing appearance of the endometrium, measuring at least 5.2 cm in thickness (series 2, image 101, series 6, image 67). Other: No abdominal wall hernia or abnormality. No abdominopelvic  ascites. Musculoskeletal: No acute or significant osseous findings. Status post bilateral hip arthroplasty. IMPRESSION: 1. Evaluation of the pelvis is significantly limited by dense metallic streak artifact from adjacent hip arthroplasty. Within this limitation, there is expansile, masslike, heterogeneously enhancing appearance of the endometrium, measuring at least 5.2 cm in thickness. Findings are in keeping with reported diagnosis of endometrial malignancy. 2. No evidence of lymphadenopathy or metastatic disease in the chest, abdomen, or pelvis. 3. There is a 0.4 cm fissural nodule the superior segment left lower lobe, almost certainly a benign intrapulmonary lymph node. Attention on follow-up. 4. Coronary artery disease. Aortic Atherosclerosis (ICD10-I70.0). Electronically Signed: By: ADelanna AhmadiM.D. On: 09/30/2021 09:25

## 2021-10-29 NOTE — Progress Notes (Signed)
GYN Location of Tumor / Histology: Endometrial  Kristin Carlson presented with symptoms of:  postmenopausal bleeding  Biopsies revealed:  A. SENTINEL LYMPH NODE, RIGHT EXTERNAL ILIAC, BIOPSY:  - Lymph node, negative for carcinoma (0/1)   B. SENTINEL LYMPH NODE, RIGHT OBTURATOR AND SURROUNDING LYMPH NODES,  BIOPSY:  - Lymph nodes, negative for carcinoma (0/5)   C. UTERUS, CERVIX, BILATERAL FALLOPIAN TUBES AND OVARIES:  - Invasive high-grade serous carcinoma  - Carcinoma focally invades into the serosal surface  - Lymphovascular invasion is present  - Benign unremarkable cervix  - Benign unremarkable bilateral fallopian tubes and ovaries  - See oncology table   D. CUL DE SAC NODULE, POSTERIOR, BIOPSY:  - Endometriosis   E. LYMPH NODE, LEFT PELVIC, BIOPSY:  - Lymph nodes, negative for carcinoma (0/3)   F. LYMPH NODE, LEFT PARAORTIC, BIOPSY:  - Lymph nodes, negative for carcinoma (0/5)   Past/Anticipated interventions by Gyn/Onc surgery, if any:  Operation: Robotic-assisted laparoscopic total hysterectomy with bilateral salpingo-oophorectomy, SLN biopsy on the right, failed mapping on the left with pelvic and para-aortic lymph node dissection on the left, peritoneal nodule biopsy, mini-lap for specimen delivery   Surgeon: Jeral Pinch MD  Past/Anticipated interventions by medical oncology, if any: Dr Alvy Bimler carboplatin & Taxol intravenous, every 3 weeks x 6 cycles  Weight changes, if any: no  Bowel/Bladder complaints, if any: Yes.  ,  diarrhea, constipation, frequency with bowel movements and urination, urge and stress incontinence, incomplete bladder and bowel emptying, bladder feels "heavy like I have a UTI but no other symptoms"  Nausea/Vomiting, if any: no  Pain issues, if any:  yes, skin sensitivity and burning sensation to left hip/pelvis area  SAFETY ISSUES: Prior radiation? no Pacemaker/ICD? no Possible current pregnancy? no, postmenopausal Is the patient on  methotrexate? no  Current Complaints / other details:  none  Vitals:   11/03/21 1224  BP: (!) 178/75  Pulse: 63  Resp: 18  Temp: (!) 96.9 F (36.1 C)  TempSrc: Temporal  SpO2: 100%  Weight: 180 lb 6 oz (81.8 kg)  Height: 5\' 3"  (1.6 m)

## 2021-11-02 NOTE — Progress Notes (Signed)
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 Evaluation of the pelvis is significantly limited by dense metallic streak artifact from adjacent hip arthroplasty. Within this limitation, there is expansile, masslike, heterogeneously enhancing appearance of the endometrium, measuring at least 5.2 cm in thickness. Findings are in keeping with reported diagnosis of endometrial malignancy. 2. No evidence of lymphadenopathy or metastatic disease in the chest, abdomen, or pelvis. 3. There is a 0.4 cm fissural nodule the superior segment left lower lobe, almost certainly a benign intrapulmonary lymph node. Attention on follow-up. 4. Coronary artery disease.   10/14/2021 Surgery   TRH/BSO, right SLN biopsy, left pelvic and PA LND, peritoneal nodule biopsy, mini-lap for specimen delivery  Findings: On EUA, 8cm bulbous uterus. ON intra-abdominal entry, normal upper abdominal survey. Normal omentum, small and large bowel. Uterus 8-10cm and bulbous. Normal appearing adnexa. Mapping successful on the right, no mapping on the left. Some mildly prominent lymph nodes bilaterally in the pelvis. Small, <5 mm, nodule in the cul de sac. No obvious intra-abdominal or pelvic evidence of disease.   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 SENTINEL LYMPH NODE, RIGHT EXTERNAL ILIAC, BIOPSY:  - Lymph node, negative for carcinoma (0/1)   B.  SENTINEL LYMPH NODE, RIGHT OBTURATOR AND SURROUNDING LYMPH NODES,  BIOPSY:  - Lymph nodes, negative for carcinoma (0/5)   C. UTERUS, CERVIX, BILATERAL FALLOPIAN TUBES AND OVARIES:  - Invasive high-grade serous carcinoma  - Carcinoma focally invades into the serosal surface  - Lymphovascular invasion is present  - Benign unremarkable cervix  - Benign unremarkable bilateral fallopian tubes and ovaries  - See oncology table   D. CUL DE SAC NODULE, POSTERIOR, BIOPSY:  - Endometriosis   E. LYMPH NODE, LEFT PELVIC, BIOPSY:  - Lymph nodes, negative for carcinoma (0/3)   F. LYMPH NODE, LEFT PARAORTIC, BIOPSY:  - Lymph nodes, negative for carcinoma (0/5)   ONCOLOGY TABLE:   UTERUS, CARCINOMA OR CARCINOSARCOMA: Resection   Procedure: Total hysterectomy and bilateral salpingo-oophorectomy  Histologic Type: Serous carcinoma  Histologic Grade: High-grade  Myometrial Invasion:       Depth of Myometrial Invasion (mm): 32 mm       Myometrial Thickness (mm): 32 mm       Percentage of Myometrial Invasion: 100%  Uterine Serosa Involvement: Present, focal  Cervical stromal Involvement: Not identified  Extent of involvement of other tissue/organs: Not identified  Peritoneal/Ascitic Fluid: Negative for carcinoma  Lymphovascular Invasion: Present  Regional Lymph Nodes:       Pelvic Lymph Nodes Examined:                                   6 Sentinel  3 Non-sentinel                                   9 Total       Pelvic Lymph Nodes with Metastasis: 0                           Macrometastasis: (>2.0 mm): 0                           Micrometastasis: (>0.2 mm and < 2.0 mm): 0                           Isolated Tumor Cells (<0.2 mm): 0                           Laterality of Lymph Node with Tumor: Not  applicable                           Extracapsular Extension: Not applicable       Para-aortic Lymph Nodes Examined:                                    0 Sentinel                                     5 Non-sentinel                                    5 Total       Para-aortic Lymph Nodes with Metastasis: 0                           Macrometastasis: (>2.0 mm): 0                           Micrometastasis:  (>0.2 mm and < 2.0 mm): 0                           Isolated Tumor Cells (<0.2 mm): 0                           Laterality of Lymph Node with Tumor: Not  applicable                           Extracapsular Extension: Not applicable  Distant Metastasis:       Distant Site(s) Involved: Not applicable  Pathologic Stage Classification (pTNM, AJCC 8th Edition): pT3a, pN0  Ancillary Studies: MMR / MSI testing will be ordered  Representative Tumor Block: C4  Comment(s): Pancytokeratin was performed on the lymph nodes and is  negative.      10/28/2021 Cancer Staging   Staging form: Corpus Uteri - Carcinoma and Carcinosarcoma, AJCC 8th Edition - Pathologic stage from 10/28/2021: Stage III (pT3, pN0, cM0) - Signed by Heath Lark, MD on 10/28/2021 Stage prefix:  Initial diagnosis    11/11/2021 -  Chemotherapy   Patient is on Treatment Plan : UTERINE Carboplatin AUC 6 / Paclitaxel q21d       Interval History: Patient presents today for follow-up after surgery.  She notes occasional twinges of pain in her abdomen and pelvis but overall no significant pain.  She has some left groin numbness as well as skin sensitivity.  She endorses a good appetite although decreased some from prior, denies any nausea or emesis.  About 4 to 5 days after surgery, she began having some intermittent spotting that then became pink-tinged and ultimately stopped.  She is unsure whether she has had any discharge.  She denies any fevers or chills.  Bladder function is at baseline.  She received a call to be seen by urogynecology for pessary fitting in December; she has canceled this visit.  She notes continued issues with bowel function, similar to before surgery, which she describes as  intermittent constipation alternating with diarrhea.  These have gradually been getting worse over multiple years.  She tells me today that at her last colonoscopy in 2018 in [place] at that severe diverticulosis was noted.  She has not had any treatment and has not seen GI since then.  Past Medical/Surgical History: Past Medical History:  Diagnosis Date   Actinic keratosis 06/04/2021   right anteromedial thigh   Arthritis    Basal cell carcinoma 06/04/2021   right suprapubic   Basal cell carcinoma 07/29/2021   left nasal ala. atypical basaloid cells   GERD (gastroesophageal reflux disease)    History of basal cell carcinoma 07/29/2021   left thigh, EDC at time of bx   HLD (hyperlipidemia)    Hypertension    Hypothyroidism    Pneumonia    HX OF YEARS AGO   PONV (postoperative nausea and vomiting)     Past Surgical History:  Procedure Laterality Date   cataract Bilateral    HIP SURGERY     KNEE ARTHROSCOPY Left 2003   NECK SURGERY  1997   Fusion C3, C4, C5   ROBOTIC ASSISTED TOTAL HYSTERECTOMY WITH BILATERAL SALPINGO OOPHERECTOMY Bilateral 10/14/2021   Procedure: XI ROBOTIC ASSISTED TOTAL HYSTERECTOMY WITH BILATERAL SALPINGO OOPHORECTOMY, LYMPH NODE DISSECTION,  LAPAROTOMY;  Surgeon: Lafonda Mosses, MD;  Location: [facility];  Service: Gynecology;  Laterality: Bilateral;   SENTINEL NODE BIOPSY N/A 10/14/2021   Procedure: SENTINEL NODE DISSECTION;  Surgeon: Lafonda Mosses, MD;  Location: [facility];  Service: Gynecology;  Laterality: N/A;   THYROIDECTOMY  2007   TOTAL HIP ARTHROPLASTY Bilateral    Left Hip -2015, Right Hip 208    Family History  Problem Relation Age of Onset   Cancer Father        Lung Cancer   Skin cancer Maternal Grandmother    Skin cancer Paternal Grandmother    Colon cancer Neg Hx    Breast cancer Neg Hx    Ovarian cancer Neg Hx    Endometrial cancer Neg Hx    Pancreatic cancer Neg Hx    Prostate cancer Neg Hx     Social History    Socioeconomic History   Marital status: Married    Spouse name: Herbie Baltimore   Number of children: 2   Years of education: Not on file   Highest education level: Not on file  Occupational History   Not on file  Tobacco Use   Smoking status: Former    Types: Cigarettes   Smokeless tobacco: Never  Vaping Use   Vaping Use: Never used  Substance and Sexual Activity   Alcohol use: Yes    Comment: RARE   Drug use: Never   Sexual activity: Not Currently  Other Topics Concern   Not on file  Social History Narrative   Retired Therapist, sports   Social Determinants of Radio broadcast assistant Strain: Not on file  Food Insecurity: Not on file  Transportation Needs: Not on file  Physical Activity: Not on file  Stress: Not on file  Social Connections: Not on file    Current Medications:  Current Outpatient Medications:    Cholecalciferol 50 MCG (2000 UT) CAPS, Take 2,000 Units by mouth daily., Disp: , Rfl:    clindamycin (CLEOCIN) 150 MG capsule, Take 1,200 mg by mouth once., Disp: , Rfl:    levothyroxine (SYNTHROID) 125 MCG tablet, Take 125 mcg by mouth daily before breakfast., Disp: , Rfl:    magnesium oxide (MAG-OX) 400 MG tablet, Take 400 mg by mouth daily., Disp: , Rfl:    Multiple Vitamins-Minerals (MULTIVITAMIN WOMEN 50+ PO), Take 1 tablet by mouth daily at 2 am., Disp: , Rfl:    Omega-3 1000 MG CAPS, Take 1,000-2,000 mg by mouth See admin instructions. Take 2000 mg in the morning 10000 at bedtime, Disp: , Rfl:    pantoprazole (PROTONIX) 20 MG tablet, Take 1 tablet by mouth daily as needed for heartburn or indigestion., Disp: , Rfl:    propranolol (INDERAL) 20 MG tablet, Take 40 mg by mouth 2 (two) times daily., Disp: , Rfl:    rosuvastatin (CRESTOR) 5 MG tablet, Take 5 mg by mouth daily., Disp: , Rfl:    temazepam (RESTORIL) 15 MG capsule, Take 15 mg by mouth at bedtime as needed for sleep., Disp: , Rfl:    dexamethasone (DECADRON) 4 MG tablet, Take 2 tabs at the night before and 2 tab  the morning of chemotherapy, every 3 weeks, by mouth x 6 cycles (Patient not taking: Reported on 11/03/2021), Disp: 36 tablet, Rfl: 6   lidocaine-prilocaine (EMLA) cream, Apply to affected area once (Patient not taking: Reported on 11/03/2021), Disp: 30 g, Rfl: 3   losartan (COZAAR) 25 MG tablet, Take 25 mg by mouth daily., Disp: , Rfl:    ondansetron (ZOFRAN) 8 MG tablet, Take 1 tablet (8 mg total) by mouth every 8 (eight) hours as needed. (Patient not taking: Reported on 11/03/2021), Disp: 30 tablet, Rfl: 1   prochlorperazine (COMPAZINE) 10 MG tablet, Take 1 tablet (10 mg total) by mouth every 6 (six) hours as needed (Nausea or vomiting). (Patient not taking: Reported on 11/03/2021), Disp: 30 tablet, Rfl: 1  Review of Systems: Pertinent positives as per HPI.  Also endorses trouble sleeping Denies appetite changes, fevers, chills, fatigue, unexplained weight changes. Denies hearing loss, neck lumps or masses, mouth sores, ringing in ears or voice changes. Denies cough or wheezing.  Denies shortness of breath. Denies chest pain or palpitations. Denies leg swelling. Denies abdominal distention, pain, blood in stools, nausea, vomiting, or early satiety. Denies pain with intercourse, dysuria, frequency, hematuria or incontinence. Denies hot flashes, pelvic pain.   Denies joint pain, back pain or muscle pain/cramps. Denies itching, rash, or wounds. Denies dizziness, headaches, or seizures. Denies swollen lymph nodes or glands, denies easy bruising or bleeding. Denies anxiety, depression, confusion, or decreased concentration.  Physical Exam: BP (!) 179/87 (BP Location: Left Arm, Patient Position: Sitting)   Pulse 62   Temp 97.9 F (36.6 C) (Tympanic)   Resp 16  Ht _0  (1.6 m)   Wt 180 lb 9.6 oz (81.9 kg)   SpO2 100%   BMI 31.99 kg/m  General: Alert, oriented, no acute distress. HEENT: Normocephalic, atraumatic, sclera anicteric. Chest: Unlabored breathing on room air. Abdomen: soft,  nontender.  Normoactive bowel sounds.  No masses or hepatosplenomegaly appreciated.  Well-healed incisions. Extremities: Grossly normal range of motion.  Warm, well perfused.  No edema bilaterally. Skin: No rashes or lesions noted. GU: Normal appearing external genitalia without erythema, excoriation, or lesions.  Speculum exam reveals moderately atrophic vaginal mucosa, speculum exam because a small amount of bleeding along the right vaginal apex.  Sutures still intact, no discharge appreciated.  Bimanual exam reveals cuff intact, no fluctuance or tenderness with palpation.    Laboratory & Radiologic Studies: None new  Assessment & Plan: Kristin Carlson is a 80 y.o. 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   Patient meeting postoperative milestones. Discussed continued expectations and restrictions.  Discussed findings on pelvic exam today.  She still has suture in place and secondary to vaginal atrophy, I think has had some spotting related to this as well as possible irritation from the suture.  No evidence of hematoma or infection today.  In terms of her left groin numbness and skin sensitivity, we discussed that this is likely related to surgery near her genitofemoral nerve as she required a full pelvic and left periaortic nodal dissection on that side as she did not map.  Hopefully, the symptoms will improve with time.  The numbness does not bother her but the skin sensitivity does.  We discussed again in detail her pathology report from surgery.  We are still waiting on HER2, which I confirmed has been sent but not received yet.  Given high risk features, recommendation was made for systemic chemotherapy and vaginal brachytherapy.  Patient has met now with both Dr. Alvy Bimler and Dr. Sondra Come.    Given GI symptoms, I offered to place a referral to establish care with GI here.  Patient was interested in this.  Referral placed today and I have asked Elmo Putt, our nurse  navigator, to call to help facilitate scheduling an earlier visit.  Patient and I discussed that chemotherapy can cause some GI dysfunction, usually constipation.  38 minutes of total time was spent for this patient encounter, including preparation, face-to-face counseling with the patient and coordination of care, and documentation of the encounter.  Jeral Pinch, MD  Division of Gynecologic Oncology  Department of Obstetrics and Gynecology  [facility]

## 2021-11-02 NOTE — Progress Notes (Signed)
Radiation Oncology         [phone] ________________________________  Initial Outpatient Consultation  Name: Kristin Carlson MRN: [id number]  Date: 11/03/2021  DOB: [birth year]  OI:NOMVEH, Kristin Cheng, FNP  Kristin Mosses, MD   REFERRING PHYSICIAN: Lafonda Mosses, MD  DIAGNOSIS: The encounter diagnosis was Endometrial cancer [facility]).  Stage III (pT3, pN0, cM0) endometrial cancer, high-grade serous  HISTORY OF PRESENT ILLNESS::Kristin Carlson is a 80 y.o. female who is accompanied by her husband. she is seen as a courtesy of Dr. Berline Carlson for an opinion concerning radiation therapy as part of management for her recently diagnosed endometrial cancer.   The patient presented to the [facility]urgent care on 08/26/21 with postmenopausal bleeding. During evaluation, the patient reported passing dark blood the prior morning, which eventually evolved into bright red blood over the course of 1 day.  Subsequently, the patient was referred to her gynecologist, Dr. Willis Carlson, on 09/11/21 for further evaluation. Endometrial biopsy  performed by Dr. Willis Carlson revealed high-grade endometrial cancer, grade 3 (favored endometrioid).  The patient was then referred to Dr. Berline Carlson on 09/22/21. During this visit, the patient described her vaginal bleeding episode as similar to her normal menses. Since then, she had intermittent bleeding with no bleeding at all on some days. She also endorsed some mild cramping during intermittent bleeding episodes. The patient was also noted to have both stress and urge urinary incontinence, for which she is followed by urogynecology (who prescribed a new medication for her urge urinary incontinence).   CT of the chest abdomen and pelvis on 09/29/21 revealed the endometrium to appear expansile, masslike, and heterogeneously enhancing, measuring at least 5.2 cm in thickness. Findings were noted as in-keeping with reported diagnosis of endometrial malignancy. No evidence of  lymphadenopathy or metastatic disease in the chest, abdomen, or pelvis were otherwise appreciated. A 0.4 cm fissural nodule located at the superior segment left lower lobe was also appreciated, though noted to almost certainly represent a benign intrapulmonary lymph node.   Following discussion of potential treatment options with Dr. Berline Carlson, the patient opted to proceed with recommended robotic assisted total laparoscopic hysterectomy, BSO, sentinel lymph node biopsies, and laparotomy on 10/14/21. Pathology from the procedure revealed:  -- Uterus: invasive high-grade serous carcinoma with lymphovascular invasion present; with carcinoma focally invading into the serosal surface -- Cervix, bilateral fallopian tubes, and ovaries: benign and unremarkable  -- Biopsies of right external iliac sentinel lymph node, right obturator and surrounding lymph nodes, left pelvic lymph node, and left paraortic lymph node: negative for carcinoma -- Biopsy of cul-de-sac nodule: endometriosis -- Peritoneal washing:  reactive mesothelial cells present  MSI testing was collected from the final surgical sample on 10/14/21 which revealed stable findings (MSI stable).   Of note: The patient's case was discussed at the tumor board held on 10/20/21.  Recommendations were for adjuvant chemotherapy and vaginal brachytherapy.  The patient was then referred to Dr. Alvy Carlson on 10/28/21 for consideration of systemic treatment. Following discussion of the risks and benefits, the patient agreed to proceed with chemotherapy consisting of carboplatin and paclitaxel. Start date scheduled for 11/11/21.    PREVIOUS RADIATION THERAPY: No  PAST MEDICAL HISTORY:  Past Medical History:  Diagnosis Date   Actinic keratosis 06/04/2021   right anteromedial thigh   Arthritis    Basal cell carcinoma 06/04/2021   right suprapubic   Basal cell carcinoma 07/29/2021   left nasal ala. atypical basaloid cells   GERD (gastroesophageal reflux  disease)    History of  basal cell carcinoma 07/29/2021   left thigh, EDC at time of bx   HLD (hyperlipidemia)    Hypertension    Hypothyroidism    Pneumonia    HX OF YEARS AGO   PONV (postoperative nausea and vomiting)     PAST SURGICAL HISTORY: Past Surgical History:  Procedure Laterality Date   cataract Bilateral    HIP SURGERY     KNEE ARTHROSCOPY Left 2003   NECK SURGERY  1997   Fusion C3, C4, C5   ROBOTIC ASSISTED TOTAL HYSTERECTOMY WITH BILATERAL SALPINGO OOPHERECTOMY Bilateral 10/14/2021   Procedure: XI ROBOTIC ASSISTED TOTAL HYSTERECTOMY WITH BILATERAL SALPINGO OOPHORECTOMY, LYMPH NODE DISSECTION,  LAPAROTOMY;  Surgeon: Kristin Mosses, MD;  Location: [facility];  Service: Gynecology;  Laterality: Bilateral;   SENTINEL NODE BIOPSY N/A 10/14/2021   Procedure: SENTINEL NODE DISSECTION;  Surgeon: Kristin Mosses, MD;  Location: [facility];  Service: Gynecology;  Laterality: N/A;   THYROIDECTOMY  2007   TOTAL HIP ARTHROPLASTY Bilateral    Left Hip -2015, Right Hip 208    FAMILY HISTORY:  Family History  Problem Relation Age of Onset   Cancer Father        Lung Cancer   Skin cancer Maternal Grandmother    Skin cancer Paternal Grandmother    Colon cancer Neg Hx    Breast cancer Neg Hx    Ovarian cancer Neg Hx    Endometrial cancer Neg Hx    Pancreatic cancer Neg Hx    Prostate cancer Neg Hx     SOCIAL HISTORY:  Social History   Tobacco Use   Smoking status: Former    Types: Cigarettes   Smokeless tobacco: Never  Vaping Use   Vaping Use: Never used  Substance Use Topics   Alcohol use: Yes    Comment: RARE   Drug use: Never    ALLERGIES:  Allergies  Allergen Reactions   Lactose Other (See Comments)    Gi- Upset   Codeine Nausea Only   Penicillins Rash   Sulfa Antibiotics Rash    MEDICATIONS:  Current Outpatient Medications  Medication Sig Dispense Refill   Cholecalciferol 50 MCG (2000 UT) CAPS Take 2,000 Units by mouth daily.     clindamycin  (CLEOCIN) 150 MG capsule Take 1,200 mg by mouth once.     levothyroxine (SYNTHROID) 125 MCG tablet Take 125 mcg by mouth daily before breakfast.     losartan (COZAAR) 25 MG tablet Take 25 mg by mouth daily.     magnesium oxide (MAG-OX) 400 MG tablet Take 400 mg by mouth daily.     Multiple Vitamins-Minerals (MULTIVITAMIN WOMEN 50+ PO) Take 1 tablet by mouth daily at 2 am.     Omega-3 1000 MG CAPS Take 1,000-2,000 mg by mouth See admin instructions. Take 2000 mg in the morning 10000 at bedtime     pantoprazole (PROTONIX) 20 MG tablet Take 1 tablet by mouth daily as needed for heartburn or indigestion.     propranolol (INDERAL) 20 MG tablet Take 40 mg by mouth 2 (two) times daily.     rosuvastatin (CRESTOR) 5 MG tablet Take 5 mg by mouth daily.     temazepam (RESTORIL) 15 MG capsule Take 15 mg by mouth at bedtime as needed for sleep.     dexamethasone (DECADRON) 4 MG tablet Take 2 tabs at the night before and 2 tab the morning of chemotherapy, every 3 weeks, by mouth x 6 cycles (Patient not taking: Reported on 11/03/2021) 36 tablet  6   lidocaine-prilocaine (EMLA) cream Apply to affected area once (Patient not taking: Reported on 11/03/2021) 30 g 3   ondansetron (ZOFRAN) 8 MG tablet Take 1 tablet (8 mg total) by mouth every 8 (eight) hours as needed. (Patient not taking: Reported on 11/03/2021) 30 tablet 1   prochlorperazine (COMPAZINE) 10 MG tablet Take 1 tablet (10 mg total) by mouth every 6 (six) hours as needed (Nausea or vomiting). (Patient not taking: Reported on 11/03/2021) 30 tablet 1   No current facility-administered medications for this encounter.    REVIEW OF SYSTEMS:  A 10+ POINT REVIEW OF SYSTEMS WAS OBTAINED including neurology, dermatology, psychiatry, cardiac, respiratory, lymph, extremities, GI, GU, musculoskeletal, constitutional, reproductive, HEENT.  She denies any pelvic pain or vaginal discharge or bleeding.  She denies any swelling in her lower extremities or numbness along the  thigh areas.   PHYSICAL EXAM:  height is _0  (1.6 m) and weight is 180 lb 6 oz (81.8 kg). Her temporal temperature is 96.9 F (36.1 C) (abnormal). Her blood pressure is 178/75 (abnormal) and her pulse is 63. Her respiration is 18 and oxygen saturation is 100%.   General: Alert and oriented, in no acute distress HEENT: Head is normocephalic. Extraocular movements are intact.  Neck: Neck is supple, no palpable cervical or supraclavicular lymphadenopathy. Heart: Regular in rate and rhythm with no murmurs, rubs, or gallops. Chest: Clear to auscultation bilaterally, with no rhonchi, wheezes, or rales. Abdomen: Soft, nontender, nondistended, with no rigidity or guarding. Extremities: No cyanosis or edema. Lymphatics: see Neck Exam Skin: No concerning lesions. Musculoskeletal: symmetric strength and muscle tone throughout. Neurologic: Cranial nerves II through XII are grossly intact. No obvious focalities. Speech is fluent. Coordination is intact. Psychiatric: Judgment and insight are intact. Affect is appropriate. Pelvic exam deferred in light of recent surgery.  The patient will see Dr. Berline Carlson later this afternoon for her first exam after surgery.  ECOG = 1  0 - Asymptomatic (Fully active, able to carry on all predisease activities without restriction)  1 - Symptomatic but completely ambulatory (Restricted in physically strenuous activity but ambulatory and able to carry out work of a light or sedentary nature. For example, light housework, office work)  2 - Symptomatic, <50% in bed during the day (Ambulatory and capable of all self care but unable to carry out any work activities. Up and about more than 50% of waking hours)  3 - Symptomatic, >50% in bed, but not bedbound (Capable of only limited self-care, confined to bed or chair 50% or more of waking hours)  4 - Bedbound (Completely disabled. Cannot carry on any self-care. Totally confined to bed or chair)  5 - Death   Eustace Pen MM, Creech  RH, Tormey DC, et al. [phone]). "Toxicity and response criteria of the [facility]". [place] Oncol. 5 (6): 649-55  LABORATORY DATA:  Lab Results  Component Value Date   WBC 4.6 10/01/2021   HGB 14.1 10/01/2021   HCT 42.5 10/01/2021   MCV 91.2 10/01/2021   PLT 203 10/01/2021   Lab Results  Component Value Date   NA 136 10/01/2021   K 4.6 10/01/2021   CL 104 10/01/2021   CO2 26 10/01/2021   GLUCOSE 99 10/01/2021   CREATININE 0.89 10/01/2021   CALCIUM 9.0 10/01/2021      RADIOGRAPHY: No results found.    IMPRESSION: Stage III (pT3, pN0, cM0) endometrial cancer, clear-cell serous  The patient would be a good candidate for vaginal  brachytherapy.  Her pathologic findings are significant for high-grade serous carcinoma as well as lymphovascular space invasion and full-thickness penetration through the myometrium.  Today, I talked to the patient and husband about the findings and work-up thus far.  We discussed the natural history of high-grade serous endometrial cancer and general treatment, highlighting the role of radiotherapy in the management.  We discussed the available radiation techniques, and focused on the details of logistics and delivery.  We reviewed the anticipated acute and late sequelae associated with radiation in this setting.  The patient was encouraged to ask questions that I answered to the best of my ability. A patient consent form was discussed and signed.  We retained a copy for our records.  The patient would like to proceed with radiation and will be scheduled for CT simulation at a later date.  PLAN: Patient will proceed with adjuvant chemotherapy in the near future.  She will be tentatively scheduled for initiation of her vaginal brachytherapy after the second or third cycle of chemotherapy.  Anticipate 5 high-dose-rate treatments directed at the vaginal cuff.  Iridium 192 will be the high-dose-rate source.   60 minutes of total time was  spent for this patient encounter, including preparation, face-to-face counseling with the patient and coordination of care, physical exam, and documentation of the encounter.   ------------------------------------------------  Blair Promise, PhD, MD  This document serves as a record of services personally performed by Gery Pray, MD. It was created on his behalf by Roney Mans, a trained medical scribe. The creation of this record is based on the scribe's personal observations and the provider's statements to them. This document has been checked and approved by the attending provider.

## 2021-11-03 ENCOUNTER — Telehealth: Payer: Self-pay | Admitting: Oncology

## 2021-11-03 ENCOUNTER — Ambulatory Visit
Admission: RE | Admit: 2021-11-03 | Discharge: 2021-11-03 | Disposition: A | Discharge status: Home / Self Care | Payer: Medicare Other | Source: Ambulatory Visit | Attending: Radiation Oncology | Admitting: Radiation Oncology

## 2021-11-03 ENCOUNTER — Encounter: Payer: Self-pay | Admitting: Radiation Oncology

## 2021-11-03 ENCOUNTER — Inpatient Hospital Stay: Payer: Medicare Other | Attending: Gynecologic Oncology | Admitting: Gynecologic Oncology

## 2021-11-03 ENCOUNTER — Other Ambulatory Visit: Payer: Self-pay

## 2021-11-03 VITALS — BP 178/75 | HR 63 | Temp 96.9°F | Resp 18 | Ht 63.0 in | Wt 180.4 lb

## 2021-11-03 VITALS — BP 179/87 | HR 62 | Temp 97.9°F | Resp 16 | Ht 63.0 in | Wt 180.6 lb

## 2021-11-03 DIAGNOSIS — E039 Hypothyroidism, unspecified: Secondary | ICD-10-CM | POA: Diagnosis not present

## 2021-11-03 DIAGNOSIS — Z87891 Personal history of nicotine dependence: Secondary | ICD-10-CM | POA: Diagnosis not present

## 2021-11-03 DIAGNOSIS — I10 Essential (primary) hypertension: Secondary | ICD-10-CM | POA: Insufficient documentation

## 2021-11-03 DIAGNOSIS — R911 Solitary pulmonary nodule: Secondary | ICD-10-CM | POA: Diagnosis not present

## 2021-11-03 DIAGNOSIS — N3946 Mixed incontinence: Secondary | ICD-10-CM | POA: Insufficient documentation

## 2021-11-03 DIAGNOSIS — C541 Malignant neoplasm of endometrium: Secondary | ICD-10-CM | POA: Insufficient documentation

## 2021-11-03 DIAGNOSIS — Z801 Family history of malignant neoplasm of trachea, bronchus and lung: Secondary | ICD-10-CM | POA: Insufficient documentation

## 2021-11-03 DIAGNOSIS — K219 Gastro-esophageal reflux disease without esophagitis: Secondary | ICD-10-CM | POA: Diagnosis not present

## 2021-11-03 DIAGNOSIS — Z85820 Personal history of malignant melanoma of skin: Secondary | ICD-10-CM | POA: Diagnosis not present

## 2021-11-03 DIAGNOSIS — Z79899 Other long term (current) drug therapy: Secondary | ICD-10-CM | POA: Diagnosis not present

## 2021-11-03 DIAGNOSIS — Z90722 Acquired absence of ovaries, bilateral: Secondary | ICD-10-CM

## 2021-11-03 DIAGNOSIS — L570 Actinic keratosis: Secondary | ICD-10-CM | POA: Insufficient documentation

## 2021-11-03 DIAGNOSIS — N952 Postmenopausal atrophic vaginitis: Secondary | ICD-10-CM

## 2021-11-03 DIAGNOSIS — Z90710 Acquired absence of both cervix and uterus: Secondary | ICD-10-CM

## 2021-11-03 DIAGNOSIS — E785 Hyperlipidemia, unspecified: Secondary | ICD-10-CM | POA: Diagnosis not present

## 2021-11-03 DIAGNOSIS — K5790 Diverticulosis of intestine, part unspecified, without perforation or abscess without bleeding: Secondary | ICD-10-CM

## 2021-11-03 NOTE — Progress Notes (Signed)
See MD note for nursing evaluation. °

## 2021-11-03 NOTE — Telephone Encounter (Signed)
Left a message for [facility] regarding referral.  Requested a return call.  Called Tanai and let her know that a message was left and that I will watch to make sure she is scheduled.

## 2021-11-03 NOTE — Patient Instructions (Signed)
It was good to see you.  You are healing well from surgery.  There is still suture or stitches at the top of your vagina.  You may have some light spotting especially as the sutures dissolve.  If you were to have heavy bleeding or spotting that does not stop, please call.  I have placed a referral for gastroenterology.  Santiago Glad is working to help coordinate a visit as soon as possible with them.

## 2021-11-04 ENCOUNTER — Other Ambulatory Visit: Payer: Self-pay | Admitting: Gynecologic Oncology

## 2021-11-04 ENCOUNTER — Telehealth: Payer: Self-pay | Admitting: Oncology

## 2021-11-04 DIAGNOSIS — N6489 Other specified disorders of breast: Secondary | ICD-10-CM

## 2021-11-04 LAB — SURGICAL PATHOLOGY

## 2021-11-04 NOTE — Telephone Encounter (Signed)
Kristin Carlson called and asked if she can schedule her diagnostic mammogram and [place] at the [facility] because it is closer to home.  Called the [facility] and they said they will need to request her previous mammogram records from [facility] and then will call the patient to schedule.  She will also need to cancel her appointment at the [facility].  Called Earlisha back and let her know to expect a call to schedule and that she will need to call the [facility] to [facility] her appointment. She verbalized understanding and will call to cancel.

## 2021-11-04 NOTE — Progress Notes (Signed)
Pharmacist Chemotherapy Monitoring - Initial Assessment    Anticipated start date: 11/11/21  The following has been reviewed per standard work regarding the patient's treatment regimen: The patient's diagnosis, treatment plan and drug doses, and organ/hematologic function Lab orders and baseline tests specific to treatment regimen  The treatment plan start date, drug sequencing, and pre-medications Prior authorization status  Patient's documented medication list, including drug-drug interaction screen and prescriptions for anti-emetics and supportive care specific to the treatment regimen The drug concentrations, fluid compatibility, administration routes, and timing of the medications to be used The patient's access for treatment and lifetime cumulative dose history, if applicable  The patient's medication allergies and previous infusion related reactions, if applicable   Changes made to treatment plan:  N/A  Follow up needed:  N/A   Larene Beach, RPH, 11/04/2021  1:54 PM

## 2021-11-06 ENCOUNTER — Inpatient Hospital Stay
Admission: RE | Admit: 2021-11-06 | Discharge: 2021-11-06 | Disposition: A | Discharge status: Home / Self Care | Payer: Self-pay | Source: Ambulatory Visit | Attending: *Deleted | Admitting: *Deleted

## 2021-11-06 ENCOUNTER — Other Ambulatory Visit: Payer: Self-pay | Admitting: *Deleted

## 2021-11-06 ENCOUNTER — Other Ambulatory Visit: Payer: Self-pay | Admitting: Radiology

## 2021-11-06 DIAGNOSIS — Z1231 Encounter for screening mammogram for malignant neoplasm of breast: Secondary | ICD-10-CM

## 2021-11-06 NOTE — H&P (Signed)
Chief Complaint: Patient was seen in consultation today for port-a-catheter placement  Referring Physician(s): Heath Lark  Supervising Physician: Mir, Sharen Heck  Patient Status: [facility] - Out-pt  History of Present Illness: Kristin Carlson is a 80 y.o. female with a medical history significant for basal cell carcinoma, HTN and a recent diagnosis of endometrial cancer. She presented to the ED 08/26/21 with vaginal bleeding and was discovered to have high-grade endometrial cancer. She is status post robotic-assisted hysterectomy, bilateral salpingo-oophorectomy and lymph node biopsies. Her oncology team is preparing her for adjuvant chemotherapy.   Interventional Radiology has been asked to evaluate this patient for an image-guided port-a-catheter placement to facilitate her treatment plans.   Past Medical History:  Diagnosis Date   Actinic keratosis 06/04/2021   right anteromedial thigh   Arthritis    Basal cell carcinoma 06/04/2021   right suprapubic   Basal cell carcinoma 07/29/2021   left nasal ala. atypical basaloid cells   GERD (gastroesophageal reflux disease)    History of basal cell carcinoma 07/29/2021   left thigh, EDC at time of bx   HLD (hyperlipidemia)    Hypertension    Hypothyroidism    Pneumonia    HX OF YEARS AGO   PONV (postoperative nausea and vomiting)     Past Surgical History:  Procedure Laterality Date   cataract Bilateral    HIP SURGERY     KNEE ARTHROSCOPY Left 2003   NECK SURGERY  1997   Fusion C3, C4, C5   ROBOTIC ASSISTED TOTAL HYSTERECTOMY WITH BILATERAL SALPINGO OOPHERECTOMY Bilateral 10/14/2021   Procedure: XI ROBOTIC ASSISTED TOTAL HYSTERECTOMY WITH BILATERAL SALPINGO OOPHORECTOMY, LYMPH NODE DISSECTION,  LAPAROTOMY;  Surgeon: Lafonda Mosses, MD;  Location: [facility] ORS;  Service: Gynecology;  Laterality: Bilateral;   SENTINEL NODE BIOPSY N/A 10/14/2021   Procedure: SENTINEL NODE DISSECTION;  Surgeon: Lafonda Mosses, MD;  Location: [facility];   Service: Gynecology;  Laterality: N/A;   THYROIDECTOMY  2007   TOTAL HIP ARTHROPLASTY Bilateral    Left Hip -2015, Right Hip 208    Allergies: Lactose, Codeine, Penicillins, and Sulfa antibiotics  Medications: Prior to Admission medications   Medication Sig Start Date End Date Taking? Authorizing Provider  acetaminophen (TYLENOL) 500 MG tablet Take 1,000 mg by mouth every 6 (six) hours as needed for moderate pain.   Yes [provider]  Cholecalciferol 50 MCG (2000 UT) CAPS Take 2,000 Units by mouth daily.   Yes [provider]  ibuprofen (ADVIL) 200 MG tablet Take 400 mg by mouth every 8 (eight) hours as needed for moderate pain.   Yes [provider]  levothyroxine (SYNTHROID) 125 MCG tablet Take 125 mcg by mouth daily before breakfast. 01/15/21 01/15/22 Yes [provider]  losartan (COZAAR) 25 MG tablet Take 25 mg by mouth daily. 10/08/21  Yes [provider]  magnesium oxide (MAG-OX) 400 MG tablet Take 400 mg by mouth daily.   Yes [provider]  Multiple Vitamins-Minerals (MULTIVITAMIN WOMEN 50+ PO) Take 1 tablet by mouth daily.   Yes [provider]  Omega-3 1000 MG CAPS Take 1,000 mg by mouth daily.   Yes [provider]  pantoprazole (PROTONIX) 20 MG tablet Take 20 mg by mouth daily as needed for heartburn or indigestion. 03/06/21  Yes [provider]  propranolol (INDERAL) 40 MG tablet Take 40 mg by mouth 2 (two) times daily.   Yes [provider]  rosuvastatin (CRESTOR) 5 MG tablet Take 5 mg by mouth daily. 03/06/21 03/06/22  Yes [provider]  Simethicone 125 MG TABS Take 250 mg by mouth 2 (two) times daily as needed (gas).   Yes [provider]  temazepam (RESTORIL) 15 MG capsule Take 15 mg by mouth at bedtime as needed for sleep. 01/31/21  Yes [provider]  clindamycin (CLEOCIN) 150 MG capsule Take 1,200 mg by mouth See admin instructions. Take 1200 mg 1 hour prior to  dental work 09/01/21   [provider]  dexamethasone (DECADRON) 4 MG tablet Take 2 tabs at the night before and 2 tab the morning of chemotherapy, every 3 weeks, by mouth x 6 cycles 10/28/21   Heath Lark, MD  lidocaine-prilocaine (EMLA) cream Apply to affected area once 10/28/21   Heath Lark, MD  ondansetron (ZOFRAN) 8 MG tablet Take 1 tablet (8 mg total) by mouth every 8 (eight) hours as needed. 10/28/21   Heath Lark, MD  prochlorperazine (COMPAZINE) 10 MG tablet Take 1 tablet (10 mg total) by mouth every 6 (six) hours as needed (Nausea or vomiting). 10/28/21   Heath Lark, MD     Family History  Problem Relation Age of Onset   Cancer Father        Lung Cancer   Skin cancer Maternal Grandmother    Skin cancer Paternal Grandmother    Colon cancer Neg Hx    Breast cancer Neg Hx    Ovarian cancer Neg Hx    Endometrial cancer Neg Hx    Pancreatic cancer Neg Hx    Prostate cancer Neg Hx     Social History   Socioeconomic History   Marital status: Married    Spouse name: Kristin Carlson   Number of children: 2   Years of education: Not on file   Highest education level: Not on file  Occupational History   Not on file  Tobacco Use   Smoking status: Former    Types: Cigarettes   Smokeless tobacco: Never  Vaping Use   Vaping Use: Never used  Substance and Sexual Activity   Alcohol use: Yes    Comment: RARE   Drug use: Never   Sexual activity: Not Currently  Other Topics Concern   Not on file  Social History Narrative   Retired Therapist, sports   Social Determinants of Radio broadcast assistant Strain: Not on file  Food Insecurity: Not on file  Transportation Needs: Not on file  Physical Activity: Not on file  Stress: Not on file  Social Connections: Not on file    Review of Systems: A 12 point ROS discussed and pertinent positives are indicated in the HPI above.  All other systems are negative.  Review of Systems  Constitutional:  Negative for appetite change and fatigue.   Respiratory:  Negative for cough and shortness of breath.   Cardiovascular:  Negative for chest pain and leg swelling.  Gastrointestinal:  Negative for diarrhea, nausea and vomiting.       Abdominal discomfort related to recent surgery   Neurological:  Negative for dizziness and headaches.   Vital Signs: BP (!) 164/78   Pulse (!) 57   Temp 98.3 F (36.8 C) (Oral)   Resp 15   Ht 5\' 3"  (1.6 m)   Wt 180 lb (81.6 kg)   SpO2 97%   BMI 31.89 kg/m   Physical Exam Constitutional:      General: She is not in acute distress.    Appearance: She is not ill-appearing.  HENT:     Mouth/Throat:  Mouth: Mucous membranes are moist.     Pharynx: Oropharynx is clear.  Cardiovascular:     Rate and Rhythm: Normal rate and regular rhythm.     Pulses: Normal pulses.     Heart sounds: Normal heart sounds.  Pulmonary:     Effort: Pulmonary effort is normal.     Breath sounds: Normal breath sounds.  Abdominal:     General: Bowel sounds are normal.     Palpations: Abdomen is soft.     Tenderness: There is abdominal tenderness.     Comments: Mild tenderness related to recent surgery. Patient states she has several laparoscopic incisions on her abdomen.   Musculoskeletal:        General: Normal range of motion.     Right lower leg: No edema.     Left lower leg: No edema.  Skin:    General: Skin is warm and dry.  Neurological:     Mental Status: She is alert and oriented to person, place, and time.    Imaging: [facility] Mammo  Result Date: 11/06/2021 This examination belongs to an outside facility and is stored here for comparison purposes only.  Contact the originating outside institution for any associated report or interpretation.  [facility] Outside [facility] Mammo  Result Date: 11/06/2021 This examination belongs to an outside facility and is stored here for comparison purposes only.  Contact the originating outside institution for any associated report or interpretation.  MM Outside  Films Mammo  Result Date: 11/06/2021 This examination belongs to an outside facility and is stored here for comparison purposes only.  Contact the originating outside institution for any associated report or interpretation.  MM Outside Films Mammo  Result Date: 11/06/2021 This examination belongs to an outside facility and is stored here for comparison purposes only.  Contact the originating outside institution for any associated report or interpretation.   Labs:  CBC: Recent Labs    10/01/21 1134  WBC 4.6  HGB 14.1  HCT 42.5  PLT 203    COAGS: No results for input(s): INR, APTT in the last 8760 hours.  BMP: Recent Labs    09/22/21 1137 10/01/21 1134  NA 140 136  K 4.7 4.6  CL 105 104  CO2 28 26  GLUCOSE 99 99  BUN 19 17  CALCIUM 9.5 9.0  CREATININE 1.01* 0.89  GFRNONAA 57* >60    LIVER FUNCTION TESTS: Recent Labs    10/01/21 1134  BILITOT 0.7  AST 26  ALT 20  ALKPHOS 54  PROT 6.8  ALBUMIN 3.7    TUMOR MARKERS: No results for input(s): AFPTM, CEA, CA199, CHROMGRNA in the last 8760 hours.  Assessment and Plan:  Endometrial cancer; chemotherapy: Kristin Carlson, 80 year old female, presents today to the [facility] department for an image-guided port-a-catheter placement.   Risks and benefits of image-guided port-a-catheter placement were discussed with the patient including, but not limited to bleeding, infection, pneumothorax, or fibrin sheath development and need for additional procedures.  All of the patient's questions were answered, patient is agreeable to proceed. She has been NPO.   Consent signed and in chart.  Thank you for this interesting consult.  I greatly enjoyed meeting Kristin Carlson and look forward to participating in their care.  A copy of this report was sent to the requesting provider on this date.  Electronically Signed: Soyla Dryer, AGACNP-BC [phone] 11/07/2021, 8:12 AM   I spent a total of  30  Minutes   in face  to face in clinical consultation, greater than 50% of which was counseling/coordinating care for port-a-catheter placement.

## 2021-11-07 ENCOUNTER — Telehealth: Payer: Self-pay | Admitting: Oncology

## 2021-11-07 ENCOUNTER — Other Ambulatory Visit: Payer: Self-pay

## 2021-11-07 ENCOUNTER — Other Ambulatory Visit: Payer: Self-pay | Admitting: Gynecologic Oncology

## 2021-11-07 ENCOUNTER — Ambulatory Visit (HOSPITAL_COMMUNITY)
Admission: RE | Admit: 2021-11-07 | Discharge: 2021-11-07 | Disposition: A | Discharge status: Home / Self Care | Payer: Medicare Other | Source: Ambulatory Visit | Attending: Hematology and Oncology | Admitting: Hematology and Oncology

## 2021-11-07 DIAGNOSIS — C541 Malignant neoplasm of endometrium: Secondary | ICD-10-CM | POA: Diagnosis present

## 2021-11-07 DIAGNOSIS — I10 Essential (primary) hypertension: Secondary | ICD-10-CM | POA: Diagnosis not present

## 2021-11-07 DIAGNOSIS — N6489 Other specified disorders of breast: Secondary | ICD-10-CM

## 2021-11-07 HISTORY — PX: IR IMAGING GUIDED PORT INSERTION: IMG5740

## 2021-11-07 MED ORDER — FENTANYL CITRATE (PF) 100 MCG/2ML IJ SOLN
INTRAMUSCULAR | Status: AC
  Filled 2021-11-07: qty 4

## 2021-11-07 MED ORDER — HEPARIN SOD (PORK) LOCK FLUSH 100 UNIT/ML IV SOLN
INTRAVENOUS | Status: AC
  Administered 2021-11-07: 500 [IU] via INTRAMUSCULAR
  Filled 2021-11-07: qty 5

## 2021-11-07 MED ORDER — SODIUM CHLORIDE 0.9 % IV SOLN: INTRAVENOUS | Status: DC

## 2021-11-07 MED ORDER — LIDOCAINE-EPINEPHRINE 1 %-1:100000 IJ SOLN
INTRAMUSCULAR | Status: AC
  Administered 2021-11-07: 15 mL
  Filled 2021-11-07: qty 1

## 2021-11-07 MED ORDER — FENTANYL CITRATE (PF) 100 MCG/2ML IJ SOLN
INTRAMUSCULAR | Status: AC | PRN
  Administered 2021-11-07 (×2): 50 ug via INTRAVENOUS

## 2021-11-07 MED ORDER — MIDAZOLAM HCL 2 MG/2ML IJ SOLN
INTRAMUSCULAR | Status: AC
  Filled 2021-11-07: qty 4

## 2021-11-07 MED ORDER — HYDRALAZINE HCL 20 MG/ML IJ SOLN
INTRAMUSCULAR | Status: AC
  Filled 2021-11-07: qty 1

## 2021-11-07 MED ORDER — MIDAZOLAM HCL 2 MG/2ML IJ SOLN
INTRAMUSCULAR | Status: AC | PRN
  Administered 2021-11-07 (×2): 1 mg via INTRAVENOUS

## 2021-11-07 MED ORDER — HYDRALAZINE HCL 20 MG/ML IJ SOLN
INTRAMUSCULAR | Status: AC | PRN
  Administered 2021-11-07: 10 mg via INTRAVENOUS

## 2021-11-07 NOTE — Procedures (Signed)
Interventional Radiology Procedure Note  Procedure: Port placement.  Indication: Uterine Ca.  Findings: Please refer to procedural dictation for full description.  Complications: None  EBL: < 10 mL  Kristin Nuccio, MD [phone]   

## 2021-11-07 NOTE — Telephone Encounter (Signed)
Octa called and said the [facility] has her past mammogram imaging now.  She said they would need an order before she can be scheduled.  Called the [facility] and they said she needs a bilateral diagnostic mammogram order since her last mammogram was in 2020. Advised we will enter a new order.  They said Denay will need to call to schedule the appointment.  Left a message for Simren advising her to call the [facility] to schedule her appointment.

## 2021-11-10 MED FILL — Dexamethasone Sodium Phosphate Inj 100 MG/10ML: INTRAMUSCULAR | Qty: 1 | Status: AC

## 2021-11-10 MED FILL — Fosaprepitant Dimeglumine For IV Infusion 150 MG (Base Eq): INTRAVENOUS | Qty: 5 | Status: AC

## 2021-11-11 ENCOUNTER — Inpatient Hospital Stay: Payer: Medicare Other

## 2021-11-11 ENCOUNTER — Inpatient Hospital Stay: Payer: Medicare Other | Attending: Gynecologic Oncology

## 2021-11-11 ENCOUNTER — Other Ambulatory Visit: Payer: Self-pay | Admitting: Hematology and Oncology

## 2021-11-11 ENCOUNTER — Encounter: Payer: Self-pay | Admitting: Hematology and Oncology

## 2021-11-11 ENCOUNTER — Inpatient Hospital Stay (HOSPITAL_BASED_OUTPATIENT_CLINIC_OR_DEPARTMENT_OTHER): Payer: Medicare Other | Admitting: Hematology and Oncology

## 2021-11-11 ENCOUNTER — Other Ambulatory Visit: Payer: Self-pay

## 2021-11-11 VITALS — BP 165/76 | HR 65 | Temp 99.1°F | Resp 18

## 2021-11-11 DIAGNOSIS — C4491 Basal cell carcinoma of skin, unspecified: Secondary | ICD-10-CM | POA: Diagnosis not present

## 2021-11-11 DIAGNOSIS — C541 Malignant neoplasm of endometrium: Secondary | ICD-10-CM | POA: Insufficient documentation

## 2021-11-11 DIAGNOSIS — I10 Essential (primary) hypertension: Secondary | ICD-10-CM | POA: Diagnosis not present

## 2021-11-11 DIAGNOSIS — Z5111 Encounter for antineoplastic chemotherapy: Secondary | ICD-10-CM | POA: Insufficient documentation

## 2021-11-11 LAB — CBC WITH DIFFERENTIAL (CANCER CENTER ONLY)
Abs Immature Granulocytes: 0.01 10*3/uL (ref 0.00–0.07)
Basophils Absolute: 0 10*3/uL (ref 0.0–0.1)
Basophils Relative: 0 %
Eosinophils Absolute: 0 10*3/uL (ref 0.0–0.5)
Eosinophils Relative: 0 %
HCT: 38.9 % (ref 36.0–46.0)
Hemoglobin: 13.3 g/dL (ref 12.0–15.0)
Immature Granulocytes: 0 %
Lymphocytes Relative: 18 %
Lymphs Abs: 1 10*3/uL (ref 0.7–4.0)
MCH: 30.5 pg (ref 26.0–34.0)
MCHC: 34.2 g/dL (ref 30.0–36.0)
MCV: 89.2 fL (ref 80.0–100.0)
Monocytes Absolute: 0 10*3/uL — ABNORMAL LOW (ref 0.1–1.0)
Monocytes Relative: 1 %
Neutro Abs: 4.6 10*3/uL (ref 1.7–7.7)
Neutrophils Relative %: 81 %
Platelet Count: 247 10*3/uL (ref 150–400)
RBC: 4.36 MIL/uL (ref 3.87–5.11)
RDW: 12.8 % (ref 11.5–15.5)
WBC Count: 5.6 10*3/uL (ref 4.0–10.5)
nRBC: 0 % (ref 0.0–0.2)

## 2021-11-11 LAB — CMP (CANCER CENTER ONLY)
ALT: 36 U/L (ref 0–44)
AST: 36 U/L (ref 15–41)
Albumin: 3.7 g/dL (ref 3.5–5.0)
Alkaline Phosphatase: 75 U/L (ref 38–126)
Anion gap: 11 (ref 5–15)
BUN: 18 mg/dL (ref 8–23)
CO2: 21 mmol/L — ABNORMAL LOW (ref 22–32)
Calcium: 9.2 mg/dL (ref 8.9–10.3)
Chloride: 105 mmol/L (ref 98–111)
Creatinine: 0.91 mg/dL (ref 0.44–1.00)
GFR, Estimated: >60 mL/min (ref >60)
Glucose, Bld: 217 mg/dL — ABNORMAL HIGH (ref 70–99)
Potassium: 4.1 mmol/L (ref 3.5–5.1)
Sodium: 137 mmol/L (ref 135–145)
Total Bilirubin: 0.5 mg/dL (ref 0.3–1.2)
Total Protein: 7.5 g/dL (ref 6.5–8.1)

## 2021-11-11 MED ORDER — SODIUM CHLORIDE 0.9 % IV SOLN: Freq: Once | INTRAVENOUS | Status: AC

## 2021-11-11 MED ORDER — SODIUM CHLORIDE 0.9 % IV SOLN
175.0000 mg/m2 | Freq: Once | INTRAVENOUS | Status: AC
  Administered 2021-11-11: 330 mg via INTRAVENOUS
  Filled 2021-11-11: qty 55

## 2021-11-11 MED ORDER — FAMOTIDINE 20 MG IN NS 100 ML IVPB
20.0000 mg | Freq: Once | INTRAVENOUS | Status: AC
  Administered 2021-11-11: 20 mg via INTRAVENOUS
  Filled 2021-11-11: qty 100

## 2021-11-11 MED ORDER — SODIUM CHLORIDE 0.9 % IV SOLN
10.0000 mg | Freq: Once | INTRAVENOUS | Status: AC
  Administered 2021-11-11: 10 mg via INTRAVENOUS
  Filled 2021-11-11: qty 10

## 2021-11-11 MED ORDER — HEPARIN SOD (PORK) LOCK FLUSH 100 UNIT/ML IV SOLN
500.0000 [IU] | Freq: Once | INTRAVENOUS | Status: AC | PRN
  Administered 2021-11-11: 500 [IU]

## 2021-11-11 MED ORDER — SODIUM CHLORIDE 0.9 % IV SOLN
510.0000 mg | Freq: Once | INTRAVENOUS | Status: AC
  Administered 2021-11-11: 510 mg via INTRAVENOUS
  Filled 2021-11-11: qty 51

## 2021-11-11 MED ORDER — SODIUM CHLORIDE 0.9% FLUSH
10.0000 mL | INTRAVENOUS | Status: DC | PRN
  Administered 2021-11-11: 10 mL

## 2021-11-11 MED ORDER — SODIUM CHLORIDE 0.9% FLUSH
10.0000 mL | Freq: Once | INTRAVENOUS | Status: AC
Start: 2021-11-11 — End: 2021-11-11
  Administered 2021-11-11: 10 mL

## 2021-11-11 MED ORDER — DIPHENHYDRAMINE HCL 50 MG/ML IJ SOLN
25.0000 mg | Freq: Once | INTRAMUSCULAR | Status: AC
  Administered 2021-11-11: 25 mg via INTRAVENOUS
  Filled 2021-11-11: qty 1

## 2021-11-11 MED ORDER — FOSAPREPITANT DIMEGLUMINE INJECTION 150 MG
150.0000 mg | Freq: Once | INTRAVENOUS | Status: AC
  Administered 2021-11-11: 150 mg via INTRAVENOUS
  Filled 2021-11-11: qty 150

## 2021-11-11 MED ORDER — PALONOSETRON HCL INJECTION 0.25 MG/5ML
0.2500 mg | Freq: Once | INTRAVENOUS | Status: AC
  Administered 2021-11-11: 0.25 mg via INTRAVENOUS
  Filled 2021-11-11: qty 5

## 2021-11-11 NOTE — Assessment & Plan Note (Signed)
Her blood pressure is elevated likely due to anxiety Observe closely for now

## 2021-11-11 NOTE — Patient Instructions (Signed)
[facility]  Discharge Instructions: Thank you for choosing [facility] to provide your oncology and hematology care.   If you have a lab appointment with the [facility], please go directly to the [facility] and check in at the registration area.   Wear comfortable clothing and clothing appropriate for easy access to any Portacath or PICC line.   We strive to give you quality time with your provider. You may need to reschedule your appointment if you arrive late (15 or more minutes).  Arriving late affects you and other patients whose appointments are after yours.  Also, if you miss three or more appointments without notifying the office, you may be dismissed from the clinic at the providers discretion.      For prescription refill requests, have your pharmacy contact our office and allow 72 hours for refills to be completed.    Today you received the following chemotherapy and/or immunotherapy agents taxol, carboplatin      To help prevent nausea and vomiting after your treatment, we encourage you to take your nausea medication as directed.  BELOW ARE SYMPTOMS THAT SHOULD BE REPORTED IMMEDIATELY: *FEVER GREATER THAN 100.4 F (38 C) OR HIGHER *CHILLS OR SWEATING *NAUSEA AND VOMITING THAT IS NOT CONTROLLED WITH YOUR NAUSEA MEDICATION *UNUSUAL SHORTNESS OF BREATH *UNUSUAL BRUISING OR BLEEDING *URINARY PROBLEMS (pain or burning when urinating, or frequent urination) *BOWEL PROBLEMS (unusual diarrhea, constipation, pain near the anus) TENDERNESS IN MOUTH AND THROAT WITH OR WITHOUT PRESENCE OF ULCERS (sore throat, sores in mouth, or a toothache) UNUSUAL RASH, SWELLING OR PAIN  UNUSUAL VAGINAL DISCHARGE OR ITCHING   Items with * indicate a potential emergency and should be followed up as soon as possible or go to the Emergency Department if any problems should occur.  Please show the CHEMOTHERAPY ALERT CARD or IMMUNOTHERAPY ALERT CARD at  check-in to the Emergency Department and triage nurse.  Should you have questions after your visit or need to cancel or reschedule your appointment, please contact [facility]  Dept: [phone]  and follow the prompts.  Office hours are 8:00 a.m. to 4:30 p.m. Monday - Friday. Please note that voicemails left after 4:00 p.m. may not be returned until the following business day.  We are closed weekends and major holidays. You have access to a nurse at all times for urgent questions. Please call the main number to the clinic Dept: [phone] and follow the prompts.   For any non-urgent questions, you may also contact your provider using MyChart. We now offer e-Visits for anyone 101 and older to request care online for non-urgent symptoms. For details visit [email or web address].   Also download the MyChart app! Go to the app store, search "MyChart", open the app, select [facility], and log in with your MyChart username and password.  Due to Covid, a mask is required upon entering the hospital/clinic. If you do not have a mask, one will be given to you upon arrival. For doctor visits, patients may have 1 support person aged 69 or older with them. For treatment visits, patients cannot have anyone with them due to current Covid guidelines and our immunocompromised population.   Paclitaxel injection What is this medication? PACLITAXEL (PAK li TAX el) is a chemotherapy drug. It targets fast dividing cells, like cancer cells, and causes these cells to die. This medicine is used to treat ovarian cancer, breast cancer, lung cancer, Kaposi's sarcoma, and other cancers. This medicine  may be used for other purposes; ask your health care provider or pharmacist if you have questions. COMMON BRAND NAME(S): Onxol, Taxol What should I tell my care team before I take this medication? They need to know if you have any of these conditions: history of irregular heartbeat liver  disease low blood counts, like low white cell, platelet, or red cell counts lung or breathing disease, like asthma tingling of the fingers or toes, or other nerve disorder an unusual or allergic reaction to paclitaxel, alcohol, polyoxyethylated castor oil, other chemotherapy, other medicines, foods, dyes, or preservatives pregnant or trying to get pregnant breast-feeding How should I use this medication? This drug is given as an infusion into a vein. It is administered in a hospital or clinic by a specially trained health care professional. Talk to your pediatrician regarding the use of this medicine in children. Special care may be needed. Overdosage: If you think you have taken too much of this medicine contact a poison control center or emergency room at once. NOTE: This medicine is only for you. Do not share this medicine with others. What if I miss a dose? It is important not to miss your dose. Call your doctor or health care professional if you are unable to keep an appointment. What may interact with this medication? Do not take this medicine with any of the following medications: live virus vaccines This medicine may also interact with the following medications: antiviral medicines for hepatitis, HIV or AIDS certain antibiotics like erythromycin and clarithromycin certain medicines for fungal infections like ketoconazole and itraconazole certain medicines for seizures like carbamazepine, phenobarbital, phenytoin gemfibrozil nefazodone rifampin St. John's wort This list may not describe all possible interactions. Give your health care provider a list of all the medicines, herbs, non-prescription drugs, or dietary supplements you use. Also tell them if you smoke, drink alcohol, or use illegal drugs. Some items may interact with your medicine. What should I watch for while using this medication? Your condition will be monitored carefully while you are receiving this medicine. You  will need important blood work done while you are taking this medicine. This medicine can cause serious allergic reactions. To reduce your risk you will need to take other medicine(s) before treatment with this medicine. If you experience allergic reactions like skin rash, itching or hives, swelling of the face, lips, or tongue, tell your doctor or health care professional right away. In some cases, you may be given additional medicines to help with side effects. Follow all directions for their use. This drug may make you feel generally unwell. This is not uncommon, as chemotherapy can affect healthy cells as well as cancer cells. Report any side effects. Continue your course of treatment even though you feel ill unless your doctor tells you to stop. Call your doctor or health care professional for advice if you get a fever, chills or sore throat, or other symptoms of a cold or flu. Do not treat yourself. This drug decreases your body's ability to fight infections. Try to avoid being around people who are sick. This medicine may increase your risk to bruise or bleed. Call your doctor or health care professional if you notice any unusual bleeding. Be careful brushing and flossing your teeth or using a toothpick because you may get an infection or bleed more easily. If you have any dental work done, tell your dentist you are receiving this medicine. Avoid taking products that contain aspirin, acetaminophen, ibuprofen, naproxen, or ketoprofen unless instructed by  your doctor. These medicines may hide a fever. Do not become pregnant while taking this medicine. Women should inform their doctor if they wish to become pregnant or think they might be pregnant. There is a potential for serious side effects to an unborn child. Talk to your health care professional or pharmacist for more information. Do not breast-feed an infant while taking this medicine. Men are advised not to father a child while receiving this  medicine. This product may contain alcohol. Ask your pharmacist or healthcare provider if this medicine contains alcohol. Be sure to tell all healthcare providers you are taking this medicine. Certain medicines, like metronidazole and disulfiram, can cause an unpleasant reaction when taken with alcohol. The reaction includes flushing, headache, nausea, vomiting, sweating, and increased thirst. The reaction can last from 30 minutes to several hours. What side effects may I notice from receiving this medication? Side effects that you should report to your doctor or health care professional as soon as possible: allergic reactions like skin rash, itching or hives, swelling of the face, lips, or tongue breathing problems changes in vision fast, irregular heartbeat high or low blood pressure mouth sores pain, tingling, numbness in the hands or feet signs of decreased platelets or bleeding - bruising, pinpoint red spots on the skin, black, tarry stools, blood in the urine signs of decreased red blood cells - unusually weak or tired, feeling faint or lightheaded, falls signs of infection - fever or chills, cough, sore throat, pain or difficulty passing urine signs and symptoms of liver injury like dark yellow or brown urine; general ill feeling or flu-like symptoms; light-colored stools; loss of appetite; nausea; right upper belly pain; unusually weak or tired; yellowing of the eyes or skin swelling of the ankles, feet, hands unusually slow heartbeat Side effects that usually do not require medical attention (report to your doctor or health care professional if they continue or are bothersome): diarrhea hair loss loss of appetite muscle or joint pain nausea, vomiting pain, redness, or irritation at site where injected tiredness This list may not describe all possible side effects. Call your doctor for medical advice about side effects. You may report side effects to FDA at 1-800-FDA-1088. Where  should I keep my medication? This drug is given in a hospital or clinic and will not be stored at home. NOTE: This sheet is a summary. It may not cover all possible information. If you have questions about this medicine, talk to your doctor, pharmacist, or health care provider.  2022 [employer]/Gold Standard (2021-08-05 00:00:00)  Carboplatin injection What is this medication? CARBOPLATIN (KAR boe pla tin) is a chemotherapy drug. It targets fast dividing cells, like cancer cells, and causes these cells to die. This medicine is used to treat ovarian cancer and many other cancers. This medicine may be used for other purposes; ask your health care provider or pharmacist if you have questions. COMMON BRAND NAME(S): Paraplatin What should I tell my care team before I take this medication? They need to know if you have any of these conditions: blood disorders hearing problems kidney disease recent or ongoing radiation therapy an unusual or allergic reaction to carboplatin, cisplatin, other chemotherapy, other medicines, foods, dyes, or preservatives pregnant or trying to get pregnant breast-feeding How should I use this medication? This drug is usually given as an infusion into a vein. It is administered in a hospital or clinic by a specially trained health care professional. Talk to your pediatrician regarding the use of this medicine in  children. Special care may be needed. Overdosage: If you think you have taken too much of this medicine contact a poison control center or emergency room at once. NOTE: This medicine is only for you. Do not share this medicine with others. What if I miss a dose? It is important not to miss a dose. Call your doctor or health care professional if you are unable to keep an appointment. What may interact with this medication? medicines for seizures medicines to increase blood counts like filgrastim, pegfilgrastim, sargramostim some antibiotics like amikacin,  gentamicin, neomycin, streptomycin, tobramycin vaccines Talk to your doctor or health care professional before taking any of these medicines: acetaminophen aspirin ibuprofen ketoprofen naproxen This list may not describe all possible interactions. Give your health care provider a list of all the medicines, herbs, non-prescription drugs, or dietary supplements you use. Also tell them if you smoke, drink alcohol, or use illegal drugs. Some items may interact with your medicine. What should I watch for while using this medication? Your condition will be monitored carefully while you are receiving this medicine. You will need important blood work done while you are taking this medicine. This drug may make you feel generally unwell. This is not uncommon, as chemotherapy can affect healthy cells as well as cancer cells. Report any side effects. Continue your course of treatment even though you feel ill unless your doctor tells you to stop. In some cases, you may be given additional medicines to help with side effects. Follow all directions for their use. Call your doctor or health care professional for advice if you get a fever, chills or sore throat, or other symptoms of a cold or flu. Do not treat yourself. This drug decreases your body's ability to fight infections. Try to avoid being around people who are sick. This medicine may increase your risk to bruise or bleed. Call your doctor or health care professional if you notice any unusual bleeding. Be careful brushing and flossing your teeth or using a toothpick because you may get an infection or bleed more easily. If you have any dental work done, tell your dentist you are receiving this medicine. Avoid taking products that contain aspirin, acetaminophen, ibuprofen, naproxen, or ketoprofen unless instructed by your doctor. These medicines may hide a fever. Do not become pregnant while taking this medicine. Women should inform their doctor if they wish  to become pregnant or think they might be pregnant. There is a potential for serious side effects to an unborn child. Talk to your health care professional or pharmacist for more information. Do not breast-feed an infant while taking this medicine. What side effects may I notice from receiving this medication? Side effects that you should report to your doctor or health care professional as soon as possible: allergic reactions like skin rash, itching or hives, swelling of the face, lips, or tongue signs of infection - fever or chills, cough, sore throat, pain or difficulty passing urine signs of decreased platelets or bleeding - bruising, pinpoint red spots on the skin, black, tarry stools, nosebleeds signs of decreased red blood cells - unusually weak or tired, fainting spells, lightheadedness breathing problems changes in hearing changes in vision chest pain high blood pressure low blood counts - This drug may decrease the number of white blood cells, red blood cells and platelets. You may be at increased risk for infections and bleeding. nausea and vomiting pain, swelling, redness or irritation at the injection site pain, tingling, numbness in the hands or feet  problems with balance, talking, walking trouble passing urine or change in the amount of urine Side effects that usually do not require medical attention (report to your doctor or health care professional if they continue or are bothersome): hair loss loss of appetite metallic taste in the mouth or changes in taste This list may not describe all possible side effects. Call your doctor for medical advice about side effects. You may report side effects to FDA at 1-800-FDA-1088. Where should I keep my medication? This drug is given in a hospital or clinic and will not be stored at home. NOTE: This sheet is a summary. It may not cover all possible information. If you have questions about this medicine, talk to your doctor, pharmacist,  or health care provider.  2022 [employer]/Gold Standard (2008-04-25 00:00:00)

## 2021-11-11 NOTE — Progress Notes (Signed)
[place] OFFICE PROGRESS NOTE  Patient Care Team: [name], FNP as PCP - General (Nurse Practitioner) Jacqulyn Liner, RN as Oncology Nurse Navigator (Oncology)  ASSESSMENT & PLAN:  Endometrial cancer [facility]) I have reviewed side effects with the patient again We will proceed with treatment without delay  Essential hypertension Her blood pressure is elevated likely due to anxiety Observe closely for now  Basal cell carcinoma I recommend the patient to defer surgery for basal cell carcinoma until completion of chemotherapy  No orders of the defined types were placed in this encounter.   All questions were answered. The patient knows to call the clinic with any problems, questions or concerns. The total time spent in the appointment was 20 minutes encounter with patients including review of chart and various tests results, discussions about plan of care and coordination of care plan   Heath Lark, MD 11/11/2021 10:53 AM  INTERVAL HISTORY: Please see below for problem oriented charting. she returns for treatment follow-up, seen prior to cycle 1 of treatment She had port placement without difficulties She had numerous questions related to side effects of treatment She had Mohs procedure scheduled for January  REVIEW OF SYSTEMS:   Constitutional: Denies fevers, chills or abnormal weight loss Eyes: Denies blurriness of vision Ears, nose, mouth, throat, and face: Denies mucositis or sore throat Respiratory: Denies cough, dyspnea or wheezes Cardiovascular: Denies palpitation, chest discomfort or lower extremity swelling Gastrointestinal:  Denies nausea, heartburn or change in bowel habits Skin: Denies abnormal skin rashes Lymphatics: Denies new lymphadenopathy or easy bruising Neurological:Denies numbness, tingling or new weaknesses Behavioral/Psych: Mood is stable, no new changes  All other systems were reviewed with the patient and are negative.  I have  reviewed the past medical history, past surgical history, social history and family history with the patient and they are unchanged from previous note.  ALLERGIES:  is allergic to lactose, codeine, penicillins, and sulfa antibiotics.  MEDICATIONS:  Current Outpatient Medications  Medication Sig Dispense Refill   acetaminophen (TYLENOL) 500 MG tablet Take 1,000 mg by mouth every 6 (six) hours as needed for moderate pain.     Cholecalciferol 50 MCG (2000 UT) CAPS Take 2,000 Units by mouth daily.     dexamethasone (DECADRON) 4 MG tablet Take 2 tabs at the night before and 2 tab the morning of chemotherapy, every 3 weeks, by mouth x 6 cycles 36 tablet 6   ibuprofen (ADVIL) 200 MG tablet Take 400 mg by mouth every 8 (eight) hours as needed for moderate pain.     levothyroxine (SYNTHROID) 125 MCG tablet Take 125 mcg by mouth daily before breakfast.     lidocaine-prilocaine (EMLA) cream Apply to affected area once 30 g 3   losartan (COZAAR) 25 MG tablet Take 25 mg by mouth daily.     magnesium oxide (MAG-OX) 400 MG tablet Take 400 mg by mouth daily.     Multiple Vitamins-Minerals (MULTIVITAMIN WOMEN 50+ PO) Take 1 tablet by mouth daily.     Omega-3 1000 MG CAPS Take 1,000 mg by mouth daily.     ondansetron (ZOFRAN) 8 MG tablet Take 1 tablet (8 mg total) by mouth every 8 (eight) hours as needed. 30 tablet 1   pantoprazole (PROTONIX) 20 MG tablet Take 20 mg by mouth daily as needed for heartburn or indigestion.     prochlorperazine (COMPAZINE) 10 MG tablet Take 1 tablet (10 mg total) by mouth every 6 (six) hours as needed (Nausea or vomiting).  30 tablet 1   propranolol (INDERAL) 40 MG tablet Take 40 mg by mouth 2 (two) times daily.     rosuvastatin (CRESTOR) 5 MG tablet Take 5 mg by mouth daily.     Simethicone 125 MG TABS Take 250 mg by mouth 2 (two) times daily as needed (gas).     temazepam (RESTORIL) 15 MG capsule Take 15 mg by mouth at bedtime as needed for sleep.     No current  facility-administered medications for this visit.   Facility-Administered Medications Ordered in Other Visits  Medication Dose Route Frequency Provider Last Rate Last Admin   CARBOplatin (PARAPLATIN) 510 mg in sodium chloride 0.9 % 250 mL chemo infusion  510 mg Intravenous Once Alvy Bimler, Analysse Quinonez, MD       fosaprepitant (EMEND) 150 mg in sodium chloride 0.9 % 145 mL IVPB  150 mg Intravenous Once Alvy Bimler, Elonzo Sopp, MD 450 mL/hr at 11/11/21 1035 150 mg at 11/11/21 1035   heparin lock flush 100 unit/mL  500 Units Intracatheter Once PRN Alvy Bimler, Dekota Shenk, MD       PACLitaxel (TAXOL) 330 mg in sodium chloride 0.9 % 500 mL chemo infusion (> $RemoveBef'80mg'[id number]$ /m2)  175 mg/m2 (Treatment Plan Recorded) Intravenous Once Alvy Bimler, Kairos Panetta, MD       sodium chloride flush (NS) 0.9 % injection 10 mL  10 mL Intracatheter PRN Alvy Bimler, Keyetta Hollingworth, MD        SUMMARY OF ONCOLOGIC HISTORY: Oncology History Overview Note  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 Evaluation of the pelvis is significantly limited by dense metallic streak artifact from adjacent hip arthroplasty. Within this limitation, there is expansile, masslike, heterogeneously enhancing appearance of the endometrium, measuring at least 5.2 cm in thickness. Findings are in keeping with reported diagnosis of endometrial malignancy. 2. No evidence of lymphadenopathy or metastatic disease in the chest, abdomen, or pelvis. 3. There is a 0.4 cm fissural nodule the superior segment left lower lobe, almost certainly a benign intrapulmonary lymph node. Attention on follow-up. 4. Coronary artery disease.   10/14/2021 Surgery   TRH/BSO, right SLN biopsy, left pelvic and PA LND, peritoneal nodule biopsy, mini-lap for specimen delivery  Findings: On EUA, 8cm bulbous uterus. ON  intra-abdominal entry, normal upper abdominal survey. Normal omentum, small and large bowel. Uterus 8-10cm and bulbous. Normal appearing adnexa. Mapping successful on the right, no mapping on the left. Some mildly prominent lymph nodes bilaterally in the pelvis. Small, <5 mm, nodule in the cul de sac. No obvious intra-abdominal or pelvic evidence of disease.   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 SENTINEL LYMPH NODE, RIGHT EXTERNAL ILIAC, BIOPSY:  - Lymph node, negative for carcinoma (0/1)   B. SENTINEL LYMPH NODE, RIGHT OBTURATOR AND SURROUNDING LYMPH NODES,  BIOPSY:  - Lymph nodes, negative for carcinoma (0/5)   C. UTERUS, CERVIX, BILATERAL FALLOPIAN TUBES AND OVARIES:  - Invasive high-grade serous carcinoma  - Carcinoma focally invades into the serosal surface  - Lymphovascular invasion is present  - Benign unremarkable cervix  - Benign unremarkable bilateral fallopian tubes  and ovaries  - See oncology table   D. CUL DE SAC NODULE, POSTERIOR, BIOPSY:  - Endometriosis   E. LYMPH NODE, LEFT PELVIC, BIOPSY:  - Lymph nodes, negative for carcinoma (0/3)   F. LYMPH NODE, LEFT PARAORTIC, BIOPSY:  - Lymph nodes, negative for carcinoma (0/5)   ONCOLOGY TABLE:   UTERUS, CARCINOMA OR CARCINOSARCOMA: Resection   Procedure: Total hysterectomy and bilateral salpingo-oophorectomy  Histologic Type: Serous carcinoma  Histologic Grade: High-grade  Myometrial Invasion:       Depth of Myometrial Invasion (mm): 32 mm       Myometrial Thickness (mm): 32 mm       Percentage of Myometrial Invasion: 100%  Uterine Serosa Involvement: Present, focal  Cervical stromal Involvement: Not identified  Extent of involvement of other tissue/organs: Not identified   Peritoneal/Ascitic Fluid: Negative for carcinoma  Lymphovascular Invasion: Present  Regional Lymph Nodes:       Pelvic Lymph Nodes Examined:                                   6 Sentinel                                   3 Non-sentinel                                   9 Total       Pelvic Lymph Nodes with Metastasis: 0                           Macrometastasis: (>2.0 mm): 0                           Micrometastasis: (>0.2 mm and < 2.0 mm): 0                           Isolated Tumor Cells (<0.2 mm): 0                           Laterality of Lymph Node with Tumor: Not  applicable                           Extracapsular Extension: Not applicable       Para-aortic Lymph Nodes Examined:                                    0 Sentinel                                    5 Non-sentinel                                    5 Total       Para-aortic Lymph Nodes with Metastasis: 0  Macrometastasis: (>2.0 mm): 0                           Micrometastasis:  (>0.2 mm and < 2.0 mm): 0                           Isolated Tumor Cells (<0.2 mm): 0                           Laterality of Lymph Node with Tumor: Not  applicable                           Extracapsular Extension: Not applicable  Distant Metastasis:       Distant Site(s) Involved: Not applicable  Pathologic Stage Classification (pTNM, AJCC 8th Edition): pT3a, pN0  Ancillary Studies: MMR / MSI testing will be ordered  Representative Tumor Block: C4  Comment(s): Pancytokeratin was performed on the lymph nodes and is negative.   By immunohistochemistry, HER-2 is EQUIVOCAL (2+).  HER-2 by FISH is pending and will be reported in an addendum.   FLOURESCENCE IN-SITU HYBRIDIZATION RESULTS:   GROUP 1:  HER2 **POSITIVE**   On the tissue sample received from this individual HER2 FISH was performed by a technologist and cell imaging and analysis on the [employer].   RATIO of HER2/CEN 17 SIGNALS: 2.50  AVERAGE HER2 COPY NUMBER  PER CELL: 4.74   The ratio of HER2/CEN 17 result exceeds the cutoff value of >=2.0 and a copy number of HER2 signals exceeding the cutoff range of >=4.0 signals per cell.  Arch Pathol Lab Med 1:1, 2018.    10/28/2021 Cancer Staging   Staging form: Corpus Uteri - Carcinoma and Carcinosarcoma, AJCC 8th Edition - Pathologic stage from 10/28/2021: Stage III (pT3, pN0, cM0) - Signed by Artis Delay, MD on 10/28/2021 Stage prefix: Initial diagnosis    11/07/2021 Procedure   Successful placement of a right internal jugular approach power injectable Port-A-Cath. The catheter is ready for immediate use.   11/11/2021 -  Chemotherapy   Patient is on Treatment Plan : UTERINE Carboplatin AUC 6 / Paclitaxel q21d       PHYSICAL EXAMINATION: ECOG PERFORMANCE STATUS: 0 - Asymptomatic  Vitals:   11/11/21 0827  BP: (!) 158/83  Pulse: 79  Resp: 18  Temp: 98.3 F (36.8 C)  SpO2: 98%   Filed Weights   11/11/21 0827  Weight: 179 lb 3.2 oz (81.3 kg)    GENERAL:alert, no distress and comfortable NEURO: alert & oriented x 3 with fluent speech, no focal motor/sensory deficits  LABORATORY DATA:  I have reviewed the data as listed    Component Value Date/Time   NA 137 11/11/2021 0818   K 4.1 11/11/2021 0818   CL 105 11/11/2021 0818   CO2 21 (L) 11/11/2021 0818   GLUCOSE 217 (H) 11/11/2021 0818   BUN 18 11/11/2021 0818   CREATININE 0.91 11/11/2021 0818   CALCIUM 9.2 11/11/2021 0818   PROT 7.5 11/11/2021 0818   ALBUMIN 3.7 11/11/2021 0818   AST 36 11/11/2021 0818   ALT 36 11/11/2021 0818   ALKPHOS 75 11/11/2021 0818   BILITOT 0.5 11/11/2021 0818   GFRNONAA >60 11/11/2021 0818    No results found for: SPEP, UPEP  Lab Results  Component Value Date   WBC 5.6 11/11/2021   NEUTROABS 4.6 11/11/2021  HGB 13.3 11/11/2021   HCT 38.9 11/11/2021   MCV 89.2 11/11/2021   PLT 247 11/11/2021      Chemistry      Component Value Date/Time   NA 137 11/11/2021 0818   K 4.1 11/11/2021 0818    CL 105 11/11/2021 0818   CO2 21 (L) 11/11/2021 0818   BUN 18 11/11/2021 0818   CREATININE 0.91 11/11/2021 0818      Component Value Date/Time   CALCIUM 9.2 11/11/2021 0818   ALKPHOS 75 11/11/2021 0818   AST 36 11/11/2021 0818   ALT 36 11/11/2021 0818   BILITOT 0.5 11/11/2021 0818       RADIOGRAPHIC STUDIES: I have personally reviewed the radiological images as listed and agreed with the findings in the report. IR IMAGING GUIDED PORT INSERTION  Result Date: 11/07/2021 INDICATION: Endometrial malignancy EXAM: IMPLANTED PORT A CATH PLACEMENT WITH ULTRASOUND AND FLUOROSCOPIC GUIDANCE MEDICATIONS: None ANESTHESIA/SEDATION: Moderate (conscious) sedation was employed during this procedure. A total of Versed 2 mg and Fentanyl 100 mcg was administered intravenously. Moderate Sedation Time: 17 minutes. The patient's level of consciousness and vital signs were monitored continuously by radiology nursing throughout the procedure under my direct supervision. FLUOROSCOPY TIME:  0 minutes, 18 seconds (1 mGy) COMPLICATIONS: None immediate. PROCEDURE: The procedure, risks, benefits, and alternatives were explained to the patient. Questions regarding the procedure were encouraged and answered. The patient understands and consents to the procedure. A timeout was performed prior to the initiation of the procedure. Patient positioned supine on the angiography table. Right neck and anterior upper chest prepped and draped in the usual sterile fashion. All elements of maximal sterile barrier were utilized including, cap, mask, sterile gown, sterile gloves, large sterile drape, hand scrubbing and 2% Chlorhexidine for skin cleaning. The right internal jugular vein was evaluated with ultrasound and shown to be patent. A permanent ultrasound image was obtained and placed in the patient's medical record. Local anesthesia was provided with 1% lidocaine with epinephrine. Using sterile gel and a sterile probe cover, the right  internal jugular vein was entered with a 21 ga needle during real time ultrasound guidance. 0.018 inch guidewire placed and 21 ga needle exchanged for transitional dilator set. Utilizing fluoroscopy, 0.035 inch guidewire advanced through the needle without difficulty. Attention then turned to the right anterior upper chest. Following local lidocaine administration, a port pocket was created. The catheter was connected to the port and brought from the pocket to the venotomy site through a subcutaneous tunnel. The catheter was cut to size and inserted through the peel-away sheath. The catheter tip was positioned at the cavoatrial junction using fluoroscopic guidance. The port aspirated and flushed well. The port pocket was closed with deep and superficial absorbable suture. The port pocket incision and venotomy sites were also sealed with Dermabond. IMPRESSION: Successful placement of a right internal jugular approach power injectable Port-A-Cath. The catheter is ready for immediate use. Electronically Signed   By: Miachel Roux M.D.   On: 11/07/2021 12:32   MM Outside Films Mammo  Result Date: 11/06/2021 This examination belongs to an outside facility and is stored here for comparison purposes only.  Contact the originating outside institution for any associated report or interpretation.  MM Outside Films Mammo  Result Date: 11/06/2021 This examination belongs to an outside facility and is stored here for comparison purposes only.  Contact the originating outside institution for any associated report or interpretation.  MM Outside Films Mammo  Result Date: 11/06/2021 This examination belongs to  an outside facility and is stored here for comparison purposes only.  Contact the originating outside institution for any associated report or interpretation.  MM Outside Films Mammo  Result Date: 11/06/2021 This examination belongs to an outside facility and is stored here for comparison purposes only.  Contact  the originating outside institution for any associated report or interpretation.

## 2021-11-11 NOTE — Assessment & Plan Note (Signed)
I recommend the patient to defer surgery for basal cell carcinoma until completion of chemotherapy

## 2021-11-11 NOTE — Assessment & Plan Note (Signed)
I have reviewed side effects with the patient again We will proceed with treatment without delay

## 2021-11-19 ENCOUNTER — Other Ambulatory Visit: Payer: Self-pay | Admitting: Gynecologic Oncology

## 2021-11-19 ENCOUNTER — Ambulatory Visit
Admission: RE | Admit: 2021-11-19 | Discharge: 2021-11-19 | Disposition: A | Discharge status: Home / Self Care | Payer: Medicare Other | Source: Ambulatory Visit | Attending: Gynecologic Oncology | Admitting: Gynecologic Oncology

## 2021-11-19 ENCOUNTER — Other Ambulatory Visit: Payer: Self-pay

## 2021-11-19 DIAGNOSIS — N6320 Unspecified lump in the left breast, unspecified quadrant: Secondary | ICD-10-CM

## 2021-11-19 DIAGNOSIS — N6489 Other specified disorders of breast: Secondary | ICD-10-CM

## 2021-11-19 DIAGNOSIS — R928 Other abnormal and inconclusive findings on diagnostic imaging of breast: Secondary | ICD-10-CM

## 2021-11-20 ENCOUNTER — Telehealth: Payer: Self-pay | Admitting: *Deleted

## 2021-11-20 NOTE — Telephone Encounter (Signed)
Connected with Taiana Temkin daughter "Eustaquio Maize" [phone]) as requested to notify of form completion.  Declined offer for this nurse to fax to employer. "I will pickup today or tomorrow.  Actually can come today before 4:30 pm." Envelope to receptionist entry registration desk.

## 2021-11-21 ENCOUNTER — Encounter (HOSPITAL_COMMUNITY): Payer: Self-pay

## 2021-11-21 ENCOUNTER — Emergency Department (HOSPITAL_COMMUNITY)
Admission: EM | Admit: 2021-11-21 | Discharge: 2021-11-21 | Disposition: A | Discharge status: Home / Self Care | Payer: Medicare Other | Attending: Emergency Medicine | Admitting: Emergency Medicine

## 2021-11-21 ENCOUNTER — Other Ambulatory Visit: Payer: Self-pay

## 2021-11-21 ENCOUNTER — Telehealth: Payer: Self-pay

## 2021-11-21 ENCOUNTER — Inpatient Hospital Stay: Payer: Medicare Other

## 2021-11-21 ENCOUNTER — Inpatient Hospital Stay (HOSPITAL_BASED_OUTPATIENT_CLINIC_OR_DEPARTMENT_OTHER): Payer: Medicare Other | Admitting: Physician Assistant

## 2021-11-21 VITALS — BP 184/83 | HR 69 | Temp 98.1°F | Resp 18

## 2021-11-21 DIAGNOSIS — Z96643 Presence of artificial hip joint, bilateral: Secondary | ICD-10-CM | POA: Insufficient documentation

## 2021-11-21 DIAGNOSIS — T80212A Local infection due to central venous catheter, initial encounter: Secondary | ICD-10-CM

## 2021-11-21 DIAGNOSIS — Z79899 Other long term (current) drug therapy: Secondary | ICD-10-CM | POA: Diagnosis not present

## 2021-11-21 DIAGNOSIS — E039 Hypothyroidism, unspecified: Secondary | ICD-10-CM | POA: Diagnosis not present

## 2021-11-21 DIAGNOSIS — Z8541 Personal history of malignant neoplasm of cervix uteri: Secondary | ICD-10-CM | POA: Insufficient documentation

## 2021-11-21 DIAGNOSIS — I10 Essential (primary) hypertension: Secondary | ICD-10-CM | POA: Insufficient documentation

## 2021-11-21 DIAGNOSIS — T8149XA Infection following a procedure, other surgical site, initial encounter: Secondary | ICD-10-CM | POA: Diagnosis present

## 2021-11-21 DIAGNOSIS — Z87891 Personal history of nicotine dependence: Secondary | ICD-10-CM | POA: Insufficient documentation

## 2021-11-21 DIAGNOSIS — C541 Malignant neoplasm of endometrium: Secondary | ICD-10-CM

## 2021-11-21 DIAGNOSIS — Z20822 Contact with and (suspected) exposure to covid-19: Secondary | ICD-10-CM | POA: Insufficient documentation

## 2021-11-21 LAB — CBC WITH DIFFERENTIAL (CANCER CENTER ONLY)
Abs Immature Granulocytes: 0 10*3/uL (ref 0.00–0.07)
Basophils Absolute: 0 10*3/uL (ref 0.0–0.1)
Basophils Relative: 1 %
Eosinophils Absolute: 0.1 10*3/uL (ref 0.0–0.5)
Eosinophils Relative: 5 %
HCT: 36.4 % (ref 36.0–46.0)
Hemoglobin: 12 g/dL (ref 12.0–15.0)
Immature Granulocytes: 0 %
Lymphocytes Relative: 52 %
Lymphs Abs: 1 10*3/uL (ref 0.7–4.0)
MCH: 30.2 pg (ref 26.0–34.0)
MCHC: 33 g/dL (ref 30.0–36.0)
MCV: 91.7 fL (ref 80.0–100.0)
Monocytes Absolute: 0.4 10*3/uL (ref 0.1–1.0)
Monocytes Relative: 19 %
Neutro Abs: 0.4 10*3/uL — CL (ref 1.7–7.7)
Neutrophils Relative %: 23 %
Platelet Count: 177 10*3/uL (ref 150–400)
RBC: 3.97 MIL/uL (ref 3.87–5.11)
RDW: 12.5 % (ref 11.5–15.5)
WBC Count: 1.9 10*3/uL — ABNORMAL LOW (ref 4.0–10.5)
nRBC: 0 % (ref 0.0–0.2)

## 2021-11-21 LAB — CBC WITH DIFFERENTIAL/PLATELET
Abs Immature Granulocytes: 0 10*3/uL (ref 0.00–0.07)
Basophils Absolute: 0 10*3/uL (ref 0.0–0.1)
Basophils Relative: 2 %
Eosinophils Absolute: 0.1 10*3/uL (ref 0.0–0.5)
Eosinophils Relative: 6 %
HCT: 38.6 % (ref 36.0–46.0)
Hemoglobin: 12.5 g/dL (ref 12.0–15.0)
Immature Granulocytes: 0 %
Lymphocytes Relative: 55 %
Lymphs Abs: 1.1 10*3/uL (ref 0.7–4.0)
MCH: 30.3 pg (ref 26.0–34.0)
MCHC: 32.4 g/dL (ref 30.0–36.0)
MCV: 93.5 fL (ref 80.0–100.0)
Monocytes Absolute: 0.4 10*3/uL (ref 0.1–1.0)
Monocytes Relative: 19 %
Neutro Abs: 0.4 10*3/uL — CL (ref 1.7–7.7)
Neutrophils Relative %: 18 %
Platelets: 186 10*3/uL (ref 150–400)
RBC: 4.13 MIL/uL (ref 3.87–5.11)
RDW: 12.7 % (ref 11.5–15.5)
WBC: 2 10*3/uL — ABNORMAL LOW (ref 4.0–10.5)
nRBC: 0 % (ref 0.0–0.2)

## 2021-11-21 LAB — CMP (CANCER CENTER ONLY)
ALT: 31 U/L (ref 0–44)
AST: 30 U/L (ref 15–41)
Albumin: 3.7 g/dL (ref 3.5–5.0)
Alkaline Phosphatase: 69 U/L (ref 38–126)
Anion gap: 8 (ref 5–15)
BUN: 18 mg/dL (ref 8–23)
CO2: 26 mmol/L (ref 22–32)
Calcium: 8.6 mg/dL — ABNORMAL LOW (ref 8.9–10.3)
Chloride: 103 mmol/L (ref 98–111)
Creatinine: 0.82 mg/dL (ref 0.44–1.00)
GFR, Estimated: >60 mL/min (ref >60)
Glucose, Bld: 102 mg/dL — ABNORMAL HIGH (ref 70–99)
Potassium: 4.2 mmol/L (ref 3.5–5.1)
Sodium: 137 mmol/L (ref 135–145)
Total Bilirubin: 0.3 mg/dL (ref 0.3–1.2)
Total Protein: 6.8 g/dL (ref 6.5–8.1)

## 2021-11-21 LAB — COMPREHENSIVE METABOLIC PANEL
ALT: 36 U/L (ref 0–44)
AST: 33 U/L (ref 15–41)
Albumin: 3.6 g/dL (ref 3.5–5.0)
Alkaline Phosphatase: 69 U/L (ref 38–126)
Anion gap: 7 (ref 5–15)
BUN: 17 mg/dL (ref 8–23)
CO2: 25 mmol/L (ref 22–32)
Calcium: 8.8 mg/dL — ABNORMAL LOW (ref 8.9–10.3)
Chloride: 105 mmol/L (ref 98–111)
Creatinine, Ser: 0.81 mg/dL (ref 0.44–1.00)
GFR, Estimated: >60 mL/min (ref >60)
Glucose, Bld: 97 mg/dL (ref 70–99)
Potassium: 4 mmol/L (ref 3.5–5.1)
Sodium: 137 mmol/L (ref 135–145)
Total Bilirubin: 0.5 mg/dL (ref 0.3–1.2)
Total Protein: 7.1 g/dL (ref 6.5–8.1)

## 2021-11-21 LAB — LACTIC ACID, PLASMA: Lactic Acid, Venous: 0.7 mmol/L (ref 0.5–1.9)

## 2021-11-21 LAB — RESP PANEL BY RT-PCR (FLU A&B, COVID) ARPGX2
Influenza A by PCR: NEGATIVE
Influenza B by PCR: NEGATIVE
SARS Coronavirus 2 by RT PCR: NEGATIVE

## 2021-11-21 MED ORDER — CEPHALEXIN 250 MG PO CAPS: 250.0000 mg | ORAL_CAPSULE | Freq: Four times a day (QID) | ORAL | 0 refills | Status: DC

## 2021-11-21 MED ORDER — DOXYCYCLINE HYCLATE 100 MG PO CAPS: 100.0000 mg | ORAL_CAPSULE | Freq: Two times a day (BID) | ORAL | 0 refills | Status: DC

## 2021-11-21 MED ORDER — SODIUM CHLORIDE 0.9 % IV SOLN
2.0000 g | Freq: Once | INTRAVENOUS | Status: AC
  Administered 2021-11-21: 19:00:00 2 g via INTRAVENOUS
  Filled 2021-11-21: qty 2

## 2021-11-21 MED ORDER — VANCOMYCIN HCL 1500 MG/300ML IV SOLN
1500.0000 mg | Freq: Once | INTRAVENOUS | Status: AC
Start: 2021-11-21 — End: 2021-11-21
  Administered 2021-11-21: 20:00:00 1500 mg via INTRAVENOUS
  Filled 2021-11-21: qty 300

## 2021-11-21 MED ORDER — METRONIDAZOLE 500 MG/100ML IV SOLN
500.0000 mg | Freq: Once | INTRAVENOUS | Status: AC
Start: 2021-11-21 — End: 2021-11-21
  Administered 2021-11-21: 18:00:00 500 mg via INTRAVENOUS
  Filled 2021-11-21: qty 100

## 2021-11-21 NOTE — ED Provider Notes (Signed)
Emergency Medicine Provider Triage Evaluation Note  Kristin Carlson , a 80 y.o. female  was evaluated in triage.  Pt complains of redness and tenderness to chest port site..  Patient states that her symptoms started yesterday evening.  Patient went to cancer center today and was found to have leukopenia with a decreased ANC.  Review of Systems  Positive: Tenderness and redness to chest port site Negative: Fever, chills  Physical Exam  BP (!) 188/87 (BP Location: Right Arm)    Pulse 70    Temp 97.9 F (36.6 C) (Oral)    Resp 15    Ht 5\' 3"  (1.6 m)    Wt 80.7 kg    SpO2 98%    BMI 31.53 kg/m  Gen:   Awake, no distress   Resp:  Normal effort  MSK:   Moves extremities without difficulty  Other:  Patient has erythema around chest port site.  Tenderness around site as well.  Medical Decision Making  Medically screening exam initiated at 4:03 PM.  Appropriate orders placed.  Amberlee Garvey was informed that the remainder of the evaluation will be completed by another provider, this initial triage assessment does not replace that evaluation, and the importance of remaining in the ED until their evaluation is complete.     Loni Beckwith, PA-C 11/21/21 1604    Varney Biles, MD 11/22/21 1537

## 2021-11-21 NOTE — ED Triage Notes (Signed)
Patient had a right chest port placed 2 weeks ago. Patient states she noted redness and tenderness to the port site yesterday. Patient denies any fever.

## 2021-11-21 NOTE — Discharge Instructions (Signed)
The testing and evaluation today indicates that you have a superficial wound infection, at the site of your Port-A-Cath.  To help that heal we are prescribing 2 different antibiotics to take, starting tomorrow morning.  He will also have to use a warm moist compress on the area that is red and swollen several times a day for 20 to 30 minutes.  Return here if you feel like things are worsening or you have other concerning symptoms.  Follow-up with your oncologist as planned.

## 2021-11-21 NOTE — ED Provider Notes (Signed)
[facility] DEPT Provider Note   CSN: [id number] Arrival date & time: 11/21/21  1520     History No chief complaint on file.   Kristin Carlson is a 80 y.o. female.  HPI Patient presents from her oncology office with concern for infected medication port.  She is currently receiving chemotherapy for uterine cancer.  The port was placed 11/07/2021.  She notes some discomfort around the port site, last night, looked at it and noticed the area was red.  She went to the oncology office today where she was assessed, had labs and sent here.  White count is 1.9 with ANC 400.  Last chemotherapy was 11/11/2021.  That was the last time the port was manipulated.  She does not have systemic symptoms including fever, chills, nausea, vomiting, chest pain, shortness of breath, weakness or dizziness.  There are no other known active modifying factors.    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 atypical basaloid cells   GERD (gastroesophageal reflux disease)    History of basal cell carcinoma 07/29/2021   left thigh, EDC at time of bx   HLD (hyperlipidemia)    Hypertension    Hypothyroidism    Pneumonia    HX OF YEARS AGO   PONV (postoperative nausea and vomiting)     Patient Active Problem List   Diagnosis Date Noted   Essential hypertension    Endometrial cancer ([place]) 09/22/2021   Basal cell carcinoma 06/04/2021    Past Surgical History:  Procedure Laterality Date   BREAST CYST ASPIRATION     cataract Bilateral    HIP SURGERY     IR IMAGING GUIDED PORT INSERTION  11/07/2021   KNEE ARTHROSCOPY Left 2003   NECK SURGERY  1997   Fusion C3, C4, C5   ROBOTIC ASSISTED TOTAL HYSTERECTOMY WITH BILATERAL SALPINGO OOPHERECTOMY Bilateral 10/14/2021   Procedure: XI ROBOTIC  ASSISTED TOTAL HYSTERECTOMY WITH BILATERAL SALPINGO OOPHORECTOMY, LYMPH NODE DISSECTION,  LAPAROTOMY;  Surgeon: Lafonda Mosses, MD;  Location: [facility] ORS;  Service: Gynecology;  Laterality: Bilateral;   SENTINEL NODE BIOPSY N/A 10/14/2021   Procedure: SENTINEL NODE DISSECTION;  Surgeon: Lafonda Mosses, MD;  Location: [facility];  Service: Gynecology;  Laterality: N/A;   THYROIDECTOMY  2007   TOTAL HIP ARTHROPLASTY Bilateral    Left Hip -2015, Right Hip 208     OB History     Gravida  3   Para      Term      Preterm      AB      Living         SAB      IAB      Ectopic      Multiple      Live Births              Family History  Problem Relation Age of Onset   Cancer Father        Lung Cancer   Skin cancer Maternal Grandmother    Skin cancer Paternal Grandmother    Colon cancer Neg Hx    Breast cancer Neg Hx    Ovarian cancer Neg Hx    Endometrial cancer Neg Hx    Pancreatic cancer Neg Hx    Prostate cancer Neg  Hx     Social History   Tobacco Use   Smoking status: Former    Types: Cigarettes   Smokeless tobacco: Never  Vaping Use   Vaping Use: Never used  Substance Use Topics   Alcohol use: Yes    Comment: RARE   Drug use: Never    Home Medications Prior to Admission medications   Medication Sig Start Date End Date Taking? Authorizing Provider  acetaminophen (TYLENOL) 500 MG tablet Take 1,000 mg by mouth every 6 (six) hours as needed for moderate pain.    [provider]  cephALEXin (KEFLEX) 250 MG capsule Take 1 capsule (250 mg total) by mouth 4 (four) times daily. 11/21/21  Yes Daleen Bo, MD  Cholecalciferol 50 MCG (2000 UT) CAPS Take 2,000 Units by mouth daily.    [provider]  dexamethasone (DECADRON) 4 MG tablet Take 2 tabs at the night before and 2 tab the morning of chemotherapy, every 3 weeks, by mouth x 6 cycles 10/28/21   Heath Lark, MD  doxycycline (VIBRAMYCIN) 100 MG capsule Take 1 capsule (100 mg total)  by mouth 2 (two) times daily. One po bid x 7 days 11/21/21  Yes Daleen Bo, MD  ibuprofen (ADVIL) 200 MG tablet Take 400 mg by mouth every 8 (eight) hours as needed for moderate pain.    [provider]  levothyroxine (SYNTHROID) 125 MCG tablet Take 125 mcg by mouth daily before breakfast. 01/15/21 01/15/22  [provider]  lidocaine-prilocaine (EMLA) cream Apply to affected area once 10/28/21   Heath Lark, MD  losartan (COZAAR) 25 MG tablet Take 25 mg by mouth daily. 10/08/21   [provider]  magnesium oxide (MAG-OX) 400 MG tablet Take 400 mg by mouth daily.    [provider]  Multiple Vitamins-Minerals (MULTIVITAMIN WOMEN 50+ PO) Take 1 tablet by mouth daily.    [provider]  Omega-3 1000 MG CAPS Take 1,000 mg by mouth daily.    [provider]  ondansetron (ZOFRAN) 8 MG tablet Take 1 tablet (8 mg total) by mouth every 8 (eight) hours as needed. 10/28/21   Heath Lark, MD  pantoprazole (PROTONIX) 20 MG tablet Take 20 mg by mouth daily as needed for heartburn or indigestion. 03/06/21   [provider]  prochlorperazine (COMPAZINE) 10 MG tablet Take 1 tablet (10 mg total) by mouth every 6 (six) hours as needed (Nausea or vomiting). 10/28/21   Heath Lark, MD  propranolol (INDERAL) 40 MG tablet Take 40 mg by mouth 2 (two) times daily.    [provider]  rosuvastatin (CRESTOR) 5 MG tablet Take 5 mg by mouth daily. 03/06/21 03/06/22  [provider]  Simethicone 125 MG TABS Take 250 mg by mouth 2 (two) times daily as needed (gas).    [provider]  temazepam (RESTORIL) 15 MG capsule Take 15 mg by mouth at bedtime as needed for sleep. 01/31/21   [provider]    Allergies    Lactose, Codeine, Penicillins, Sulfa antibiotics, and Vancomycin  Review of Systems   Review of Systems  All other systems reviewed and are negative.  Physical Exam Updated Vital Signs BP (!) 162/91    Pulse 79    Temp  97.9 F (36.6 C) (Oral)    Resp 18    Ht 5\' 3"  (1.6 m)    Wt 80.7 kg    SpO2 100%    BMI 31.53 kg/m   Physical Exam Vitals and nursing note reviewed.  Constitutional:      General: She is not in acute distress.    Appearance: She is well-developed. She is not ill-appearing, toxic-appearing or diaphoretic.  HENT:     Head: Normocephalic and atraumatic.     Right Ear: External ear normal.     Left Ear: External ear normal.  Eyes:     Conjunctiva/sclera: Conjunctivae normal.     Pupils: Pupils are equal, round, and reactive to light.  Neck:     Trachea: Phonation normal.  Cardiovascular:     Rate and Rhythm: Normal rate and regular rhythm.     Comments: There is tenderness and redness around the incision where her right upper chest wall port access site is.  The incision is not dehisced, but slightly open.  There is no drainage or bleeding from this wound.  There is mild surrounding erythema.  Pictures documented in the EMR, taken earlier at the oncology office.  This is an appropriate representation.  The more superior site for placement of the catheter in the central vein, he is well approximated, and healing without signs of infection.  There is no swelling along the tunnel extending from the clavicle region to the port access site.  There is minimal swelling around the port access site. Pulmonary:     Effort: Pulmonary effort is normal. No respiratory distress.     Breath sounds: No stridor.  Abdominal:     General: There is no distension.     Palpations: Abdomen is soft.     Tenderness: There is no abdominal tenderness.  Musculoskeletal:        General: Normal range of motion.     Cervical back: Normal range of motion and neck supple.  Skin:    General: Skin is warm and dry.  Neurological:     Mental Status: She is alert and oriented to person, place, and time.     Cranial Nerves: No cranial nerve deficit.     Sensory: No sensory deficit.     Motor: No abnormal muscle tone.      Coordination: Coordination normal.  Psychiatric:        Mood and Affect: Mood normal.        Behavior: Behavior normal.        Thought Content: Thought content normal.        Judgment: Judgment normal.    ED Results / Procedures / Treatments   Labs (all labs ordered are listed, but only abnormal results are displayed) Labs Reviewed  COMPREHENSIVE METABOLIC PANEL - Abnormal; Notable for the following components:      Result Value   Calcium 8.8 (*)    All other components within normal limits  CBC WITH DIFFERENTIAL/PLATELET - Abnormal; Notable for the following components:   WBC 2.0 (*)    Neutro Abs 0.4 (*)    All other components within normal limits  CULTURE, BLOOD (ROUTINE X 2)  RESP PANEL BY RT-PCR (FLU A&B, COVID) ARPGX2  CULTURE, BLOOD (ROUTINE X 2)  LACTIC ACID, PLASMA  LACTIC ACID, PLASMA    EKG None  Radiology No results found.  Procedures Procedures   Medications Ordered in ED Medications  vancomycin (VANCOREADY) IVPB 1500 mg/300 mL (0 mg Intravenous Stopped 11/21/21 2008)  ceFEPIme (MAXIPIME) 2 g in sodium chloride 0.9 % 100 mL IVPB (0 g Intravenous Stopped 11/21/21 1920)  metroNIDAZOLE (FLAGYL) IVPB 500 mg (0 mg Intravenous Stopped 11/21/21 1838)    ED Course  I have reviewed the triage vital signs  and the nursing notes.  Pertinent labs & imaging results that were available during my care of the patient were reviewed by me and considered in my medical decision making (see chart for details).  Clinical Course as of 11/24/21 0732  Fri Nov 21, 2021  1659 Had a brief conversation with interventional radiologist, Dr. Maryelizabeth Kaufmann.  He feels like this is a probable superficial infection, and can be treated with broad-spectrum oral antibiotic. [EW]  0092 I discussed the case with Dr. Lindi Adie, on-call for her oncologist.  He thinks based on the finding for superficial wound infection that the patient can be treated as an outpatient with oral antibiotics. [EW]     Clinical Course User Index [EW] Daleen Bo, MD   MDM Rules/Calculators/A&P                          Patient Vitals for the past 24 hrs:  BP Temp Temp src Pulse Resp SpO2 Height Weight  11/21/21 2000 (!) 162/91 -- -- 79 18 100 % -- --  11/21/21 1935 (!) 179/76 -- -- 75 18 100 % -- --  11/21/21 1815 (!) 154/82 -- -- 78 18 97 % 5\' 3"  (1.6 m) 80.7 kg  11/21/21 1715 (!) 163/94 -- -- 74 18 100 % -- --  11/21/21 1523 (!) 188/87 97.9 F (36.6 C) Oral 70 15 98 % 5\' 3"  (1.6 m) 80.7 kg    8:44 PM Reevaluation with update and discussion. After initial assessment and treatment, an updated evaluation reveals she developed a potential allergic reaction to vancomycin, characterized by redness of her scalp with itching.  This abated  shortly after the vancomycin was turned off.  At this time the patient is calm comfortable with reassuring vital signs.  Findings discussed and questions answered.  Daughter in the room at the time. Daleen Bo   Medical Decision Making:  This patient is presenting for evaluation of suspected wound infection, which does require a range of treatment options, and is a complaint that involves a moderate risk of morbidity and mortality. The differential diagnoses include superficial infection, deep infection, catheter appliance infection. I decided to review old records, and in summary elderly female presenting with apparent skin infection at the site of recent central vascular port placement.  Patient is receiving chemotherapy, and is in a compromised with labs today showing low white count and low ANC.  She is neutropenic. I did not require additional historical information from anyone.  Clinical Laboratory Tests Ordered, included CBC and blood culture, lactate, viral panel . Review indicates normal except white count and ANC are low, calcium low.   Critical Interventions-clinical evaluation, laboratory testing, discussion with IR and oncology, observation and  reassessment  After These Interventions, the Patient was reevaluated and was found stable for discharge.  Patient with superficial wound infection, not compromising central vascular access.  CRITICAL CARE-no Performed by: Daleen Bo  Nursing Notes Reviewed/ Care Coordinated Applicable Imaging Reviewed Interpretation of Laboratory Data incorporated into ED treatment  The patient appears reasonably screened and/or stabilized for discharge and I doubt any other medical condition or other [facility] requiring further screening, evaluation, or treatment in the ED at this time prior to discharge.  Plan: Home Medications-continue usual; Home Treatments-warm compresses to wound, 3 times daily; return here if the recommended treatment, does not improve the symptoms; Recommended follow up-oncology follow-up ASAP        Final Clinical Impression(s) / ED Diagnoses Final diagnoses:  Superficial  postoperative wound infection    Rx / DC Orders ED Discharge Orders          Ordered    cephALEXin (KEFLEX) 250 MG capsule  4 times daily        11/21/21 2046    doxycycline (VIBRAMYCIN) 100 MG capsule  2 times daily        11/21/21 2046             Daleen Bo, MD 11/24/21 [phone]

## 2021-11-21 NOTE — Progress Notes (Signed)
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 Evaluation of the pelvis is significantly limited by dense metallic streak artifact from adjacent hip arthroplasty. Within this limitation, there is expansile, masslike, heterogeneously enhancing appearance of the endometrium, measuring at least 5.2 cm in thickness. Findings are in keeping with reported diagnosis of endometrial malignancy. 2. No evidence of lymphadenopathy or metastatic disease in the chest, abdomen, or pelvis. 3. There is a 0.4 cm fissural nodule the superior segment left lower lobe, almost certainly a benign intrapulmonary lymph node. Attention on follow-up. 4. Coronary artery disease.   10/14/2021 Surgery   TRH/BSO, right SLN biopsy, left pelvic and PA LND, peritoneal nodule biopsy, mini-lap for specimen delivery  Findings: On EUA, 8cm bulbous uterus. ON intra-abdominal entry, normal upper abdominal survey. Normal omentum, small and large bowel. Uterus 8-10cm and bulbous. Normal appearing adnexa. Mapping successful on the right, no mapping on the left. Some mildly prominent lymph nodes bilaterally in the pelvis. Small, <5 mm, nodule in the cul de sac. No obvious intra-abdominal or pelvic evidence of disease.   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 SENTINEL LYMPH NODE, RIGHT EXTERNAL ILIAC, BIOPSY:  - Lymph node, negative for carcinoma (0/1)   B. SENTINEL LYMPH NODE, RIGHT OBTURATOR AND SURROUNDING LYMPH NODES,  BIOPSY:  - Lymph nodes, negative for carcinoma (0/5)   C. UTERUS, CERVIX, BILATERAL FALLOPIAN TUBES AND OVARIES:  - Invasive high-grade serous carcinoma  - Carcinoma focally invades into the serosal surface  - Lymphovascular invasion is present  - Benign unremarkable cervix  - Benign unremarkable bilateral fallopian tubes and ovaries  - See oncology table   D. CUL DE SAC NODULE, POSTERIOR, BIOPSY:  - Endometriosis   E. LYMPH NODE, LEFT PELVIC, BIOPSY:  - Lymph nodes, negative for carcinoma (0/3)   F. LYMPH NODE, LEFT PARAORTIC, BIOPSY:  - Lymph nodes, negative for carcinoma (0/5)   ONCOLOGY TABLE:   UTERUS, CARCINOMA OR CARCINOSARCOMA: Resection   Procedure: Total hysterectomy and bilateral salpingo-oophorectomy  Histologic Type: Serous carcinoma  Histologic Grade: High-grade  Myometrial Invasion:       Depth of Myometrial Invasion (mm): 32 mm       Myometrial Thickness (mm): 32 mm       Percentage of Myometrial Invasion: 100%  Uterine Serosa Involvement: Present, focal  Cervical stromal Involvement: Not identified  Extent of involvement of other tissue/organs: Not identified  Peritoneal/Ascitic Fluid: Negative for carcinoma  Lymphovascular Invasion: Present  Regional Lymph Nodes:       Pelvic Lymph Nodes Examined:  6 Sentinel                                   3 Non-sentinel                                   9 Total       Pelvic Lymph Nodes with Metastasis: 0                           Macrometastasis: (>2.0 mm): 0                           Micrometastasis: (>0.2 mm and < 2.0 mm): 0                            Isolated Tumor Cells (<0.2 mm): 0                           Laterality of Lymph Node with Tumor: Not  applicable                           Extracapsular Extension: Not applicable       Para-aortic Lymph Nodes Examined:                                    0 Sentinel                                    5 Non-sentinel                                    5 Total       Para-aortic Lymph Nodes with Metastasis: 0                           Macrometastasis: (>2.0 mm): 0                           Micrometastasis:  (>0.2 mm and < 2.0 mm): 0                           Isolated Tumor Cells (<0.2 mm): 0                           Laterality of Lymph Node with Tumor: Not  applicable                           Extracapsular Extension: Not applicable  Distant Metastasis:       Distant Site(s) Involved: Not applicable  Pathologic Stage Classification (pTNM, AJCC 8th Edition): pT3a, pN0  Ancillary Studies: MMR / MSI testing will be ordered  Representative Tumor Block: C4  Comment(s): Pancytokeratin was performed on the lymph nodes and is negative.   By immunohistochemistry, HER-2 is  EQUIVOCAL (2+).  HER-2 by FISH is pending and will be reported in an addendum.   FLOURESCENCE IN-SITU HYBRIDIZATION RESULTS:   GROUP 1:  HER2 **POSITIVE**   On the tissue sample received from this individual HER2 FISH was performed by a technologist and cell imaging and analysis on the [employer].   RATIO of HER2/CEN 17 SIGNALS: 2.50  AVERAGE HER2 COPY NUMBER PER CELL: 4.74   The ratio of HER2/CEN 17 result exceeds the cutoff value of >=2.0 and a copy number of HER2 signals exceeding the cutoff range of >=4.0 signals per cell.  Arch Pathol Lab Med 1:1, 2018.    10/28/2021 Cancer Staging   Staging form: Corpus Uteri - Carcinoma and Carcinosarcoma, AJCC 8th Edition - Pathologic stage from 10/28/2021: Stage III (pT3, pN0, cM0) - Signed by Heath Lark, MD on 10/28/2021 Stage prefix: Initial diagnosis    11/07/2021 Procedure    Successful placement of a right internal jugular approach power injectable Port-A-Cath. The catheter is ready for immediate use.   11/11/2021 -  Chemotherapy   Patient is on Treatment Plan : UTERINE Carboplatin AUC 6 / Paclitaxel q21d       Current Therapy: Carboplatin and paclitaxel with last treatment 11/11/21  Interval history- Nychelle Cassata is a 80 yo female with history of uterine cancer as stated above presenting to [facility] today with chief complaint of pain and redness of port x 1 day. Port was recently placed 11/07/21 and patient has had no problems with it until symptom onset. She states at her treatment 11/11/21 she had a circular bandaid placed over the port and when she removed it later that evening some of the glue accidentally came loose when pulling the bandaid. Last night she felt soreness over port site and when she looked in the mirror she noticed it was red. Area is tender and aches with palpation. She rates pain 10/10 in severity with palpation only. She cleaned it with peroxide last night and applied antibiotic ointment. She denies any fever, chills, night sweats, purulent drainage, bleeding, shortness of breath.    ROS  All other systems are reviewed and are negative for acute change except as noted in the HPI.    Allergies  Allergen Reactions   Lactose Other (See Comments)    Gi- Upset   Codeine Nausea And Vomiting   Penicillins Rash   Sulfa Antibiotics Rash     Past Medical History:  Diagnosis Date   Actinic keratosis 06/04/2021   right anteromedial thigh   Arthritis    Basal cell carcinoma 06/04/2021   right suprapubic, EDC 07/29/2021   Basal cell carcinoma 07/29/2021   left nasal ala. atypical basaloid cells   GERD (gastroesophageal reflux disease)    History of basal cell carcinoma 07/29/2021   left thigh, EDC at time of bx   HLD (hyperlipidemia)    Hypertension    Hypothyroidism    Pneumonia    HX OF YEARS AGO   PONV (postoperative nausea and  vomiting)      Past Surgical History:  Procedure Laterality Date   BREAST CYST ASPIRATION     cataract Bilateral    HIP SURGERY     IR IMAGING GUIDED PORT INSERTION  11/07/2021   KNEE ARTHROSCOPY Left 2003   NECK SURGERY  1997   Fusion C3, C4, C5   ROBOTIC ASSISTED TOTAL HYSTERECTOMY WITH BILATERAL SALPINGO OOPHERECTOMY Bilateral 10/14/2021   Procedure: XI ROBOTIC ASSISTED TOTAL HYSTERECTOMY WITH BILATERAL SALPINGO OOPHORECTOMY, LYMPH NODE DISSECTION,  LAPAROTOMY;  Surgeon: Lafonda Mosses, MD;  Location: [facility];  Service: Gynecology;  Laterality: Bilateral;   SENTINEL NODE BIOPSY N/A 10/14/2021   Procedure: SENTINEL NODE DISSECTION;  Surgeon: Lafonda Mosses, MD;  Location: [facility];  Service: Gynecology;  Laterality: N/A;   THYROIDECTOMY  2007   TOTAL HIP ARTHROPLASTY Bilateral    Left Hip -2015, Right Hip 208    Social History   Socioeconomic History   Marital status: Married    Spouse name: Herbie [place]   Number of children: 2   Years of education: Not on file   Highest education level: Not on file  Occupational History   Not on file  Tobacco Use   Smoking status: Former    Types: Cigarettes   Smokeless tobacco: Never  Vaping Use   Vaping Use: Never used  Substance and Sexual Activity   Alcohol use: Yes    Comment: RARE   Drug use: Never   Sexual activity: Not Currently  Other Topics Concern   Not on file  Social History Narrative   Retired Therapist, sports   Social Determinants of Radio broadcast assistant Strain: Not on file  Food Insecurity: Not on file  Transportation Needs: Not on file  Physical Activity: Not on file  Stress: Not on file  Social Connections: Not on file  Intimate Partner Violence: Not on file    Family History  Problem Relation Age of Onset   Cancer Father        Lung Cancer   Skin cancer Maternal Grandmother    Skin cancer Paternal Grandmother    Colon cancer Neg Hx    Breast cancer Neg Hx    Ovarian cancer Neg Hx    Endometrial  cancer Neg Hx    Pancreatic cancer Neg Hx    Prostate cancer Neg Hx      Current Outpatient Medications:    acetaminophen (TYLENOL) 500 MG tablet, Take 1,000 mg by mouth every 6 (six) hours as needed for moderate pain., Disp: , Rfl:    Cholecalciferol 50 MCG (2000 UT) CAPS, Take 2,000 Units by mouth daily., Disp: , Rfl:    dexamethasone (DECADRON) 4 MG tablet, Take 2 tabs at the night before and 2 tab the morning of chemotherapy, every 3 weeks, by mouth x 6 cycles, Disp: 36 tablet, Rfl: 6   ibuprofen (ADVIL) 200 MG tablet, Take 400 mg by mouth every 8 (eight) hours as needed for moderate pain., Disp: , Rfl:    levothyroxine (SYNTHROID) 125 MCG tablet, Take 125 mcg by mouth daily before breakfast., Disp: , Rfl:    lidocaine-prilocaine (EMLA) cream, Apply to affected area once, Disp: 30 g, Rfl: 3   losartan (COZAAR) 25 MG tablet, Take 25 mg by mouth daily., Disp: , Rfl:    magnesium oxide (MAG-OX) 400 MG tablet, Take 400 mg by mouth daily., Disp: , Rfl:    Multiple Vitamins-Minerals (MULTIVITAMIN WOMEN 50+ PO), Take 1 tablet by mouth daily., Disp: , Rfl:    Omega-3 1000 MG CAPS, Take 1,000 mg by mouth daily., Disp: , Rfl:    ondansetron (ZOFRAN) 8 MG tablet, Take 1 tablet (8 mg total) by mouth every 8 (eight) hours as needed., Disp: 30 tablet, Rfl: 1   pantoprazole (PROTONIX) 20 MG tablet, Take 20 mg by mouth daily as needed for heartburn or indigestion., Disp: , Rfl:    prochlorperazine (COMPAZINE) 10 MG tablet, Take 1 tablet (10 mg total) by mouth every 6 (six) hours as needed (Nausea or  vomiting)., Disp: 30 tablet, Rfl: 1   propranolol (INDERAL) 40 MG tablet, Take 40 mg by mouth 2 (two) times daily., Disp: , Rfl:    rosuvastatin (CRESTOR) 5 MG tablet, Take 5 mg by mouth daily., Disp: , Rfl:    Simethicone 125 MG TABS, Take 250 mg by mouth 2 (two) times daily as needed (gas)., Disp: , Rfl:    temazepam (RESTORIL) 15 MG capsule, Take 15 mg by mouth at bedtime as needed for sleep., Disp: , Rfl:    PHYSICAL EXAM: ECOG FS:1 - Symptomatic but completely ambulatory    Vitals:   11/21/21 1316  BP: (!) 184/83  Pulse: 69  Resp: 18  Temp: 98.1 F (36.7 C)  TempSrc: Oral  SpO2: 100%   Physical Exam Vitals and nursing note reviewed.  Constitutional:      Appearance: She is well-developed. She is not ill-appearing or toxic-appearing.  HENT:     Head: Normocephalic and atraumatic.     Nose: Nose normal.  Eyes:     General: No scleral icterus.       Right eye: No discharge.        Left eye: No discharge.     Conjunctiva/sclera: Conjunctivae normal.  Neck:     Vascular: No JVD.  Cardiovascular:     Rate and Rhythm: Normal rate and regular rhythm.     Pulses: Normal pulses.          Radial pulses are 2+ on the right side and 2+ on the left side.     Heart sounds: Normal heart sounds.  Pulmonary:     Effort: Pulmonary effort is normal.     Breath sounds: Normal breath sounds.  Chest:     Comments: Port in right upper chest with overlying erythema and warmth. Tender to palpation. Wound dehiscence noted in mid incision. No purulent drainage. Minimal golden colored scabbing seen in wound opening.  Please see media below. Patient gave verbal permission to utilize photo for medical documentation only Abdominal:     General: There is no distension.  Musculoskeletal:        General: Normal range of motion.     Cervical back: Normal range of motion.     Comments: Compartments soft in bilateral upper extremities.  Skin:    General: Skin is warm and dry.  Neurological:     Mental Status: She is oriented to person, place, and time.     GCS: GCS eye subscore is 4. GCS verbal subscore is 5. GCS motor subscore is 6.     Comments: Fluent speech, no facial droop.  Psychiatric:        Behavior: Behavior normal.        LABORATORY DATA: I have reviewed the data as listed CBC Latest Ref Rng & Units 11/21/2021 11/11/2021 10/01/2021  WBC 4.0 - 10.5 K/uL 1.9(L) 5.6 4.6  Hemoglobin  12.0 - 15.0 g/dL 12.0 13.3 14.1  Hematocrit 36.0 - 46.0 % 36.4 38.9 42.5  Platelets 150 - 400 K/uL 177 247 203     CMP Latest Ref Rng & Units 11/21/2021 11/11/2021 10/01/2021  Glucose 70 - 99 mg/dL 102(H) 217(H) 99  BUN 8 - 23 mg/dL $Remove'18 18 17  'htBmwcM$ Creatinine 0.44 - 1.00 mg/dL 0.82 0.91 0.89  Sodium 135 - 145 mmol/L 137 137 136  Potassium 3.5 - 5.1 mmol/L 4.2 4.1 4.6  Chloride 98 - 111 mmol/L 103 105 104  CO2 22 - 32 mmol/L 26 21(L) 26  Calcium 8.9 -  10.3 mg/dL 8.6(L) 9.2 9.0  Total Protein 6.5 - 8.1 g/dL 6.8 7.5 6.8  Total Bilirubin 0.3 - 1.2 mg/dL 0.3 0.5 0.7  Alkaline Phos 38 - 126 U/L 69 75 54  AST 15 - 41 U/L 30 36 26  ALT 0 - 44 U/L 31 36 20       RADIOGRAPHIC STUDIES: I have personally reviewed the radiological images as listed and agreed with the findings in the report. No images are attached to the encounter. US BREAST LTD UNI LEFT INC AXILLA  Result Date: 11/19/2021 CLINICAL DATA:  80 year old female presenting for diagnostic evaluation of a nodule identified on chest CT in the left breast. The patient is currently undergoing chemotherapy for uterine cancer. EXAM: DIGITAL DIAGNOSTIC BILATERAL MAMMOGRAM WITH TOMOSYNTHESIS AND CAD; ULTRASOUND LEFT BREAST LIMITED TECHNIQUE: Bilateral digital diagnostic mammography and breast tomosynthesis was performed. The images were evaluated with computer-aided detection.; Targeted ultrasound examination of the left breast was performed. COMPARISON:  Previous exam(s). ACR Breast Density Category c: The breast tissue is heterogeneously dense, which may obscure small masses. FINDINGS: Mammogram: Right breast: No suspicious mass, distortion, or microcalcifications are identified to suggest presence of malignancy. Left breast: Spot compression tomosynthesis views of the left breast were performed in addition to standard views. There is an irregular mass with distortion in the upper outer left breast measuring approximately 1.4 cm. There are no  additional suspicious findings elsewhere in the left breast. On physical exam I feel a subtle focal thickening in the upper slightly outer left breast. Ultrasound: Targeted ultrasound performed in the left breast at 1 o'clock 6 cm from the nipple demonstrating an irregular hypoechoic mass measuring 1.7 x 1.2 x 1.9 cm. There is internal vascularity. This corresponds to the mammographic finding. Targeted ultrasound the left axilla demonstrates normal lymph nodes. IMPRESSION: 1. Highly suspicious mass in the left breast at 1 o'clock measuring 1.9 cm. 2.  No mammographic evidence of malignancy in the right breast. RECOMMENDATION: Ultrasound-guided core needle biopsy of the left breast mass at 1 o'clock. I have discussed the findings and recommendations with the patient. The patient will be contacted by our scheduler to arrange the biopsy appointment. BI-RADS CATEGORY  5: Highly suggestive of malignancy. Electronically Signed   By: Audie Pinto M.D.   On: 11/19/2021 11:56  MM DIAG BREAST TOMO BILATERAL  Result Date: 11/19/2021 CLINICAL DATA:  80 year old female presenting for diagnostic evaluation of a nodule identified on chest CT in the left breast. The patient is currently undergoing chemotherapy for uterine cancer. EXAM: DIGITAL DIAGNOSTIC BILATERAL MAMMOGRAM WITH TOMOSYNTHESIS AND CAD; ULTRASOUND LEFT BREAST LIMITED TECHNIQUE: Bilateral digital diagnostic mammography and breast tomosynthesis was performed. The images were evaluated with computer-aided detection.; Targeted ultrasound examination of the left breast was performed. COMPARISON:  Previous exam(s). ACR Breast Density Category c: The breast tissue is heterogeneously dense, which may obscure small masses. FINDINGS: Mammogram: Right breast: No suspicious mass, distortion, or microcalcifications are identified to suggest presence of malignancy. Left breast: Spot compression tomosynthesis views of the left breast were performed in addition to standard  views. There is an irregular mass with distortion in the upper outer left breast measuring approximately 1.4 cm. There are no additional suspicious findings elsewhere in the left breast. On physical exam I feel a subtle focal thickening in the upper slightly outer left breast. Ultrasound: Targeted ultrasound performed in the left breast at 1 o'clock 6 cm from the nipple demonstrating an irregular hypoechoic mass measuring 1.7 x 1.2 x 1.9 cm. There  is internal vascularity. This corresponds to the mammographic finding. Targeted ultrasound the left axilla demonstrates normal lymph nodes. IMPRESSION: 1. Highly suspicious mass in the left breast at 1 o'clock measuring 1.9 cm. 2.  No mammographic evidence of malignancy in the right breast. RECOMMENDATION: Ultrasound-guided core needle biopsy of the left breast mass at 1 o'clock. I have discussed the findings and recommendations with the patient. The patient will be contacted by our scheduler to arrange the biopsy appointment. BI-RADS CATEGORY  5: Highly suggestive of malignancy. Electronically Signed   By: Audie Pinto M.D.   On: 11/19/2021 11:56  IR IMAGING GUIDED PORT INSERTION  Result Date: 11/07/2021 INDICATION: Endometrial malignancy EXAM: IMPLANTED PORT A CATH PLACEMENT WITH ULTRASOUND AND FLUOROSCOPIC GUIDANCE MEDICATIONS: None ANESTHESIA/SEDATION: Moderate (conscious) sedation was employed during this procedure. A total of Versed 2 mg and Fentanyl 100 mcg was administered intravenously. Moderate Sedation Time: 17 minutes. The patient's level of consciousness and vital signs were monitored continuously by radiology nursing throughout the procedure under my direct supervision. FLUOROSCOPY TIME:  0 minutes, 18 seconds (1 mGy) COMPLICATIONS: None immediate. PROCEDURE: The procedure, risks, benefits, and alternatives were explained to the patient. Questions regarding the procedure were encouraged and answered. The patient understands and consents to the  procedure. A timeout was performed prior to the initiation of the procedure. Patient positioned supine on the angiography table. Right neck and anterior upper chest prepped and draped in the usual sterile fashion. All elements of maximal sterile barrier were utilized including, cap, mask, sterile gown, sterile gloves, large sterile drape, hand scrubbing and 2% Chlorhexidine for skin cleaning. The right internal jugular vein was evaluated with ultrasound and shown to be patent. A permanent ultrasound image was obtained and placed in the patient's medical record. Local anesthesia was provided with 1% lidocaine with epinephrine. Using sterile gel and a sterile probe cover, the right internal jugular vein was entered with a 21 ga needle during real time ultrasound guidance. 0.018 inch guidewire placed and 21 ga needle exchanged for transitional dilator set. Utilizing fluoroscopy, 0.035 inch guidewire advanced through the needle without difficulty. Attention then turned to the right anterior upper chest. Following local lidocaine administration, a port pocket was created. The catheter was connected to the port and brought from the pocket to the venotomy site through a subcutaneous tunnel. The catheter was cut to size and inserted through the peel-away sheath. The catheter tip was positioned at the cavoatrial junction using fluoroscopic guidance. The port aspirated and flushed well. The port pocket was closed with deep and superficial absorbable suture. The port pocket incision and venotomy sites were also sealed with Dermabond. IMPRESSION: Successful placement of a right internal jugular approach power injectable Port-A-Cath. The catheter is ready for immediate use. Electronically Signed   By: Miachel Roux M.D.   On: 11/07/2021 12:32   MM Outside Films Mammo  Result Date: 11/06/2021 This examination belongs to an outside facility and is stored here for comparison purposes only.  Contact the originating outside  institution for any associated report or interpretation.  MM Outside Films Mammo  Result Date: 11/06/2021 This examination belongs to an outside facility and is stored here for comparison purposes only.  Contact the originating outside institution for any associated report or interpretation.  MM Outside Films Mammo  Result Date: 11/06/2021 This examination belongs to an outside facility and is stored here for comparison purposes only.  Contact the originating outside institution for any associated report or interpretation.  MM Outside Films Mammo  Result Date: 11/06/2021 This examination belongs to an outside facility and is stored here for comparison purposes only.  Contact the originating outside institution for any associated report or interpretation.    ASSESSMENT & PLAN: Patient is a 80 y.o. female with history of basal cell carcinoma and recent diagnosis of endometrial cancer followed by oncologist Dr. Alvy Bimler.  #) Port site redness- Patient is afebrile, HDS. Patient is non toxic appearing although there is high concern for port site infection. There is wound dehiscence and scabbing seen with overlying erythema, warmth, tenderness. Labs in clinic today show CBC with leukopenia 1.9 and ANC 0.4. CMP overall unremarkable. As patient immunocompromised with port infection she will need ED evaluation and likely admission for antibiotics and ?port removal. Patient's oncologist is out of the office, on call oncologist Dr. Lindi Adie made aware. Patient agreeable with plan and transported to ED by myself and RN in stable condition.  #) Uterine cancer- Recently started treatment, will defer treatment decisions to oncologist.     Visit Diagnosis: 1. Port or reservoir infection, initial encounter   2. Endometrial cancer ([place])      No orders of the defined types were placed in this encounter.   I have spent a total of 30 minutes minutes of face-to-face and non-face-to-face time, preparing to see  the patient, obtaining and/or reviewing separately obtained history, performing a medically appropriate examination, counseling and educating the patient, ordering tests,  documenting clinical information in the electronic health record, and care coordination.     Thank you for allowing me to participate in the care of this patient.    Barrie Folk, PA-C Department of Hematology/Oncology [facility] Phone: [phone]  [phone]    11/21/2021 4:08 PM

## 2021-11-21 NOTE — ED Notes (Signed)
Pt called out c/o inflammation and intense pruritis, stopped Vancomycin and spoke with Provider.  Vancomycin discontinued, will observe.

## 2021-11-21 NOTE — Progress Notes (Signed)
CRITICAL VALUE STICKER  CRITICAL VALUE: 0.4 ANC  RECEIVER (on-site recipient of call): Thyra Breed  DATE & TIME NOTIFIED: 11/21/21 2:34 PM   MESSENGER (representative from lab): Lawana Pai  MD NOTIFIED: Sherol Dade, PA.  TIME OF NOTIFICATION: 1440  RESPONSE:

## 2021-11-21 NOTE — Telephone Encounter (Signed)
Returned her call. Yesterday her port site started feeling painful. It is red and slightly swollen. No drainage.  Given appt with [facility] today at 1 pm to see PA. She is aware of the appt time.

## 2021-11-21 NOTE — Progress Notes (Signed)
A consult was received from an ED physician for vancomycin per pharmacy dosing.  The patient's profile has been reviewed for ht/wt/allergies/indication/available labs.   A one time order has been placed for vancomycin 1500mg  x 1.  Further antibiotics/pharmacy consults should be ordered by admitting physician if indicated.                       Thank you, Peggyann Juba, PharmD, BCPS 11/21/2021  5:05 PM

## 2021-11-25 ENCOUNTER — Other Ambulatory Visit: Payer: Medicare Other

## 2021-11-26 ENCOUNTER — Inpatient Hospital Stay (HOSPITAL_BASED_OUTPATIENT_CLINIC_OR_DEPARTMENT_OTHER): Payer: Medicare Other | Admitting: Physician Assistant

## 2021-11-26 ENCOUNTER — Other Ambulatory Visit: Payer: Self-pay

## 2021-11-26 ENCOUNTER — Inpatient Hospital Stay: Payer: Medicare Other

## 2021-11-26 VITALS — BP 155/83 | HR 69 | Temp 97.9°F | Resp 18 | Wt 179.8 lb

## 2021-11-26 DIAGNOSIS — T80212D Local infection due to central venous catheter, subsequent encounter: Secondary | ICD-10-CM | POA: Diagnosis not present

## 2021-11-26 DIAGNOSIS — C541 Malignant neoplasm of endometrium: Secondary | ICD-10-CM

## 2021-11-26 DIAGNOSIS — Z5111 Encounter for antineoplastic chemotherapy: Secondary | ICD-10-CM | POA: Diagnosis not present

## 2021-11-26 LAB — CBC WITH DIFFERENTIAL (CANCER CENTER ONLY)
Abs Immature Granulocytes: 0.03 10*3/uL (ref 0.00–0.07)
Basophils Absolute: 0 10*3/uL (ref 0.0–0.1)
Basophils Relative: 1 %
Eosinophils Absolute: 0.2 10*3/uL (ref 0.0–0.5)
Eosinophils Relative: 5 %
HCT: 36.7 % (ref 36.0–46.0)
Hemoglobin: 11.9 g/dL — ABNORMAL LOW (ref 12.0–15.0)
Immature Granulocytes: 1 %
Lymphocytes Relative: 46 %
Lymphs Abs: 1.5 10*3/uL (ref 0.7–4.0)
MCH: 29.6 pg (ref 26.0–34.0)
MCHC: 32.4 g/dL (ref 30.0–36.0)
MCV: 91.3 fL (ref 80.0–100.0)
Monocytes Absolute: 0.5 10*3/uL (ref 0.1–1.0)
Monocytes Relative: 15 %
Neutro Abs: 1 10*3/uL — ABNORMAL LOW (ref 1.7–7.7)
Neutrophils Relative %: 32 %
Platelet Count: 228 10*3/uL (ref 150–400)
RBC: 4.02 MIL/uL (ref 3.87–5.11)
RDW: 12.7 % (ref 11.5–15.5)
WBC Count: 3.2 10*3/uL — ABNORMAL LOW (ref 4.0–10.5)
nRBC: 0 % (ref 0.0–0.2)

## 2021-11-26 LAB — CMP (CANCER CENTER ONLY)
ALT: 23 U/L (ref 0–44)
AST: 26 U/L (ref 15–41)
Albumin: 3.7 g/dL (ref 3.5–5.0)
Alkaline Phosphatase: 65 U/L (ref 38–126)
Anion gap: 7 (ref 5–15)
BUN: 15 mg/dL (ref 8–23)
CO2: 27 mmol/L (ref 22–32)
Calcium: 9.1 mg/dL (ref 8.9–10.3)
Chloride: 103 mmol/L (ref 98–111)
Creatinine: 0.94 mg/dL (ref 0.44–1.00)
GFR, Estimated: >60 mL/min
Glucose, Bld: 96 mg/dL (ref 70–99)
Potassium: 4.3 mmol/L (ref 3.5–5.1)
Sodium: 137 mmol/L (ref 135–145)
Total Bilirubin: 0.4 mg/dL (ref 0.3–1.2)
Total Protein: 6.9 g/dL (ref 6.5–8.1)

## 2021-11-26 LAB — CULTURE, BLOOD (ROUTINE X 2): Culture: NO GROWTH

## 2021-11-26 NOTE — Patient Instructions (Signed)
-  Continue antibiotics. Start warm compresses at least 3 times/day.  -If you develop fever, chills, worsening redness or pain, drainage from the port or bleeding please go to the Emergency Department for evaluation

## 2021-11-26 NOTE — Progress Notes (Signed)
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 Evaluation of the pelvis is significantly limited by dense metallic streak artifact from adjacent hip arthroplasty. Within this limitation, there is expansile, masslike, heterogeneously enhancing appearance of the endometrium, measuring at least 5.2 cm in thickness. Findings are in keeping with reported diagnosis of endometrial malignancy. 2. No evidence of lymphadenopathy or metastatic disease in the chest, abdomen, or pelvis. 3. There is a 0.4 cm fissural nodule the superior segment left lower lobe, almost certainly a benign intrapulmonary lymph node. Attention on follow-up. 4. Coronary artery disease.   10/14/2021 Surgery   TRH/BSO, right SLN biopsy, left pelvic and PA LND, peritoneal nodule biopsy, mini-lap for specimen delivery  Findings: On EUA, 8cm bulbous uterus. ON intra-abdominal entry, normal upper abdominal survey. Normal omentum, small and large bowel. Uterus 8-10cm and bulbous. Normal appearing adnexa. Mapping successful on the right, no mapping on the left. Some mildly prominent lymph nodes bilaterally in the pelvis. Small, <5 mm, nodule in the cul de sac. No obvious intra-abdominal or pelvic evidence of disease.   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 SENTINEL LYMPH NODE, RIGHT EXTERNAL ILIAC, BIOPSY:  - Lymph node, negative for carcinoma (0/1)   B. SENTINEL LYMPH NODE, RIGHT OBTURATOR AND SURROUNDING LYMPH NODES,  BIOPSY:  - Lymph nodes, negative for carcinoma (0/5)   C. UTERUS, CERVIX, BILATERAL FALLOPIAN TUBES AND OVARIES:  - Invasive high-grade serous carcinoma  - Carcinoma focally invades into the serosal surface  - Lymphovascular invasion is present  - Benign unremarkable cervix  - Benign unremarkable bilateral fallopian tubes and ovaries  - See oncology table   D. CUL DE SAC NODULE, POSTERIOR, BIOPSY:  - Endometriosis   E. LYMPH NODE, LEFT PELVIC, BIOPSY:  - Lymph nodes, negative for carcinoma (0/3)   F. LYMPH NODE, LEFT PARAORTIC, BIOPSY:  - Lymph nodes, negative for carcinoma (0/5)   ONCOLOGY TABLE:   UTERUS, CARCINOMA OR CARCINOSARCOMA: Resection   Procedure: Total hysterectomy and bilateral salpingo-oophorectomy  Histologic Type: Serous carcinoma  Histologic Grade: High-grade  Myometrial Invasion:       Depth of Myometrial Invasion (mm): 32 mm       Myometrial Thickness (mm): 32 mm       Percentage of Myometrial Invasion: 100%  Uterine Serosa Involvement: Present, focal  Cervical stromal Involvement: Not identified  Extent of involvement of other tissue/organs: Not identified  Peritoneal/Ascitic Fluid: Negative for carcinoma  Lymphovascular Invasion: Present  Regional Lymph Nodes:       Pelvic Lymph Nodes Examined:  6 Sentinel                                   3 Non-sentinel                                   9 Total       Pelvic Lymph Nodes with Metastasis: 0                           Macrometastasis: (>2.0 mm): 0                           Micrometastasis: (>0.2 mm and < 2.0 mm): 0                            Isolated Tumor Cells (<0.2 mm): 0                           Laterality of Lymph Node with Tumor: Not  applicable                           Extracapsular Extension: Not applicable       Para-aortic Lymph Nodes Examined:                                    0 Sentinel                                    5 Non-sentinel                                    5 Total       Para-aortic Lymph Nodes with Metastasis: 0                           Macrometastasis: (>2.0 mm): 0                           Micrometastasis:  (>0.2 mm and < 2.0 mm): 0                           Isolated Tumor Cells (<0.2 mm): 0                           Laterality of Lymph Node with Tumor: Not  applicable                           Extracapsular Extension: Not applicable  Distant Metastasis:       Distant Site(s) Involved: Not applicable  Pathologic Stage Classification (pTNM, AJCC 8th Edition): pT3a, pN0  Ancillary Studies: MMR / MSI testing will be ordered  Representative Tumor Block: C4  Comment(s): Pancytokeratin was performed on the lymph nodes and is negative.   By immunohistochemistry, HER-2 is  EQUIVOCAL (2+).  HER-2 by FISH is pending and will be reported in an addendum.   FLOURESCENCE IN-SITU HYBRIDIZATION RESULTS:   GROUP 1:  HER2 **POSITIVE**   On the tissue sample received from this individual HER2 FISH was performed by a technologist and cell imaging and analysis on the [employer].   RATIO of HER2/CEN 17 SIGNALS: 2.50  AVERAGE HER2 COPY NUMBER PER CELL: 4.74   The ratio of HER2/CEN 17 result exceeds the cutoff value of >=2.0 and a copy number of HER2 signals exceeding the cutoff range of >=4.0 signals per cell.  Arch Pathol Lab Med 1:1, 2018.    10/28/2021 Cancer Staging   Staging form: Corpus Uteri - Carcinoma and Carcinosarcoma, AJCC 8th Edition - Pathologic stage from 10/28/2021: Stage III (pT3, pN0, cM0) - Signed by Heath Lark, MD on 10/28/2021 Stage prefix: Initial diagnosis    11/07/2021 Procedure    Successful placement of a right internal jugular approach power injectable Port-A-Cath. The catheter is ready for immediate use.   11/11/2021 -  Chemotherapy   Patient is on Treatment Plan : UTERINE Carboplatin AUC 6 / Paclitaxel q21d       Current Therapy:  Carboplatin and paclitaxel with last treatment 11/11/21  Interval history- Kristin Carlson is a 80 yo female with history of uterine cancer as listed above presenting to [facility] today with chief complaint of port site redness and pain x2 days. Port placed by IR 11/07/21.  Last time port was manipulated was chemo treatment on 11/11/2021. Patient seen here in clinic 11/21/21. There was concern for port infection at that time and patient was transferred to ED. ED evaluation showed neutropenia without lactic acidosis. IR and oncology was consulted by ED attending and plan was to treat superficial infection with PO keflex and doxycycline. Patient did have allergic reaction to vancomycin in ED and was given IV flagyl and cefepime prior to discharge home.  Patient states that antibiotics seems to be helping at first and redness as well as pain had significantly improved.  Beginning 2 days ago the pain had started to return and she noticed the area was red again.  She has been compliant with Keflex and doxycycline.  She has not been doing warm compresses because the area had look to be improving.  She is wanting to have the area checked today to make sure there is no worsening infection.  She describes the pain as an ache.  She rates the pain a 7 out of 10 in severity.  She states the pain is mostly positional or with deep palpation.  She denies any fever, chills, drainage from site, bleeding, chest pain, shortness of breath, weakness, dizziness, nausea or vomiting.  She denies any injury or trauma to the area.  ROS  All other systems are reviewed and are negative for acute change except as noted in the HPI.    Allergies  Allergen Reactions   Lactose Other  (See Comments)    Gi- Upset   Codeine Nausea And Vomiting   Penicillins Rash   Sulfa Antibiotics Rash   Vancomycin Itching and Rash     Past Medical History:  Diagnosis Date   Actinic keratosis 06/04/2021   right anteromedial thigh   Arthritis    Basal cell carcinoma 06/04/2021   right suprapubic, EDC 07/29/2021   Basal cell carcinoma 07/29/2021   left nasal ala. atypical basaloid cells   GERD (gastroesophageal reflux disease)    History of basal cell carcinoma 07/29/2021   left thigh,  EDC at time of bx   HLD (hyperlipidemia)    Hypertension    Hypothyroidism    Pneumonia    HX OF YEARS AGO   PONV (postoperative nausea and vomiting)      Past Surgical History:  Procedure Laterality Date   BREAST CYST ASPIRATION     cataract Bilateral    HIP SURGERY     IR IMAGING GUIDED PORT INSERTION  11/07/2021   KNEE ARTHROSCOPY Left 2003   NECK SURGERY  1997   Fusion C3, C4, C5   ROBOTIC ASSISTED TOTAL HYSTERECTOMY WITH BILATERAL SALPINGO OOPHERECTOMY Bilateral 10/14/2021   Procedure: XI ROBOTIC ASSISTED TOTAL HYSTERECTOMY WITH BILATERAL SALPINGO OOPHORECTOMY, LYMPH NODE DISSECTION,  LAPAROTOMY;  Surgeon: Lafonda Mosses, MD;  Location: [facility];  Service: Gynecology;  Laterality: Bilateral;   SENTINEL NODE BIOPSY N/A 10/14/2021   Procedure: SENTINEL NODE DISSECTION;  Surgeon: Lafonda Mosses, MD;  Location: [facility];  Service: Gynecology;  Laterality: N/A;   THYROIDECTOMY  2007   TOTAL HIP ARTHROPLASTY Bilateral    Left Hip -2015, Right Hip 208    Social History   Socioeconomic History   Marital status: Married    Spouse name: Herbie [place]   Number of children: 2   Years of education: Not on file   Highest education level: Not on file  Occupational History   Not on file  Tobacco Use   Smoking status: Former    Types: Cigarettes   Smokeless tobacco: Never  Vaping Use   Vaping Use: Never used  Substance and Sexual Activity   Alcohol use: Yes    Comment: RARE   Drug  use: Never   Sexual activity: Not Currently  Other Topics Concern   Not on file  Social History Narrative   Retired Therapist, sports   Social Determinants of Radio broadcast assistant Strain: Not on Art therapist Insecurity: Not on file  Transportation Needs: Not on file  Physical Activity: Not on file  Stress: Not on file  Social Connections: Not on file  Intimate Partner Violence: Not on file    Family History  Problem Relation Age of Onset   Cancer Father        Lung Cancer   Skin cancer Maternal Grandmother    Skin cancer Paternal Grandmother    Colon cancer Neg Hx    Breast cancer Neg Hx    Ovarian cancer Neg Hx    Endometrial cancer Neg Hx    Pancreatic cancer Neg Hx    Prostate cancer Neg Hx      Current Outpatient Medications:    acetaminophen (TYLENOL) 500 MG tablet, Take 1,000 mg by mouth every 6 (six) hours as needed for moderate pain., Disp: , Rfl:    cephALEXin (KEFLEX) 250 MG capsule, Take 1 capsule (250 mg total) by mouth 4 (four) times daily., Disp: 28 capsule, Rfl: 0   Cholecalciferol 50 MCG (2000 UT) CAPS, Take 2,000 Units by mouth daily., Disp: , Rfl:    dexamethasone (DECADRON) 4 MG tablet, Take 2 tabs at the night before and 2 tab the morning of chemotherapy, every 3 weeks, by mouth x 6 cycles, Disp: 36 tablet, Rfl: 6   doxycycline (VIBRAMYCIN) 100 MG capsule, Take 1 capsule (100 mg total) by mouth 2 (two) times daily. One po bid x 7 days, Disp: 14 capsule, Rfl: 0   ibuprofen (ADVIL) 200 MG tablet, Take 400 mg by mouth every 8 (eight) hours as needed for moderate pain., Disp: , Rfl:  levothyroxine (SYNTHROID) 125 MCG tablet, Take 125 mcg by mouth daily before breakfast., Disp: , Rfl:    lidocaine-prilocaine (EMLA) cream, Apply to affected area once, Disp: 30 g, Rfl: 3   losartan (COZAAR) 25 MG tablet, Take 25 mg by mouth daily., Disp: , Rfl:    magnesium oxide (MAG-OX) 400 MG tablet, Take 400 mg by mouth daily., Disp: , Rfl:    Multiple Vitamins-Minerals  (MULTIVITAMIN WOMEN 50+ PO), Take 1 tablet by mouth daily., Disp: , Rfl:    Omega-3 1000 MG CAPS, Take 1,000 mg by mouth daily., Disp: , Rfl:    ondansetron (ZOFRAN) 8 MG tablet, Take 1 tablet (8 mg total) by mouth every 8 (eight) hours as needed., Disp: 30 tablet, Rfl: 1   pantoprazole (PROTONIX) 20 MG tablet, Take 20 mg by mouth daily as needed for heartburn or indigestion., Disp: , Rfl:    prochlorperazine (COMPAZINE) 10 MG tablet, Take 1 tablet (10 mg total) by mouth every 6 (six) hours as needed (Nausea or vomiting)., Disp: 30 tablet, Rfl: 1   propranolol (INDERAL) 40 MG tablet, Take 40 mg by mouth 2 (two) times daily., Disp: , Rfl:    rosuvastatin (CRESTOR) 5 MG tablet, Take 5 mg by mouth daily., Disp: , Rfl:    Simethicone 125 MG TABS, Take 250 mg by mouth 2 (two) times daily as needed (gas)., Disp: , Rfl:    temazepam (RESTORIL) 15 MG capsule, Take 15 mg by mouth at bedtime as needed for sleep., Disp: , Rfl:   PHYSICAL EXAM: ECOG FS:1 - Symptomatic but completely ambulatory    Vitals:   11/26/21 1200  BP: (!) 155/83  Pulse: 69  Resp: 18  Temp: 97.9 F (36.6 C)  SpO2: 99%  Weight: 179 lb 12.8 oz (81.6 kg)   Physical Exam Vitals and nursing note reviewed.  Constitutional:      Appearance: She is well-developed. She is not ill-appearing or toxic-appearing.  HENT:     Head: Normocephalic and atraumatic.     Nose: Nose normal.  Eyes:     General: No scleral icterus.       Right eye: No discharge.        Left eye: No discharge.     Conjunctiva/sclera: Conjunctivae normal.  Neck:     Vascular: No JVD.  Cardiovascular:     Rate and Rhythm: Normal rate and regular rhythm.     Pulses: Normal pulses.     Heart sounds: Normal heart sounds.  Pulmonary:     Effort: Pulmonary effort is normal.     Breath sounds: Normal breath sounds.  Chest:     Comments: Please see media below. Patient gave verbal permission to utilize photo for medical documentation only.  Erythema over  port site.  No tenderness to palpation.  No purulent drainage or palpable fluctuance.  No wound dehiscence.  Scab seen in midline incision. No swelling along the tunneling extending from clavicle to port access. Mild swelling around port site.  Abdominal:     General: There is no distension.  Musculoskeletal:        General: Normal range of motion.     Cervical back: Normal range of motion.  Skin:    General: Skin is warm and dry.  Neurological:     Mental Status: She is oriented to person, place, and time.     GCS: GCS eye subscore is 4. GCS verbal subscore is 5. GCS motor subscore is 6.     Comments: Fluent  speech, no facial droop.  Psychiatric:        Behavior: Behavior normal.        LABORATORY DATA: I have reviewed the data as listed CBC Latest Ref Rng & Units 11/26/2021 11/21/2021 11/21/2021  WBC 4.0 - 10.5 K/uL 3.2(L) 2.0(L) 1.9(L)  Hemoglobin 12.0 - 15.0 g/dL 11.9(L) 12.5 12.0  Hematocrit 36.0 - 46.0 % 36.7 38.6 36.4  Platelets 150 - 400 K/uL 228 186 177     CMP Latest Ref Rng & Units 11/26/2021 11/21/2021 11/21/2021  Glucose 70 - 99 mg/dL 96 97 102(H)  BUN 8 - 23 mg/dL 15 17 18   Creatinine 0.44 - 1.00 mg/dL 0.94 0.81 0.82  Sodium 135 - 145 mmol/L 137 137 137  Potassium 3.5 - 5.1 mmol/L 4.3 4.0 4.2  Chloride 98 - 111 mmol/L 103 105 103  CO2 22 - 32 mmol/L 27 25 26   Calcium 8.9 - 10.3 mg/dL 9.1 8.8(L) 8.6(L)  Total Protein 6.5 - 8.1 g/dL 6.9 7.1 6.8  Total Bilirubin 0.3 - 1.2 mg/dL 0.4 0.5 0.3  Alkaline Phos 38 - 126 U/L 65 69 69  AST 15 - 41 U/L 26 33 30  ALT 0 - 44 U/L 23 36 31       RADIOGRAPHIC STUDIES: I have personally reviewed the radiological images as listed and agreed with the findings in the report. No images are attached to the encounter. US BREAST LTD UNI LEFT INC AXILLA  Result Date: 11/19/2021 CLINICAL DATA:  80 year old female presenting for diagnostic evaluation of a nodule identified on chest CT in the left breast. The patient is  currently undergoing chemotherapy for uterine cancer. EXAM: DIGITAL DIAGNOSTIC BILATERAL MAMMOGRAM WITH TOMOSYNTHESIS AND CAD; ULTRASOUND LEFT BREAST LIMITED TECHNIQUE: Bilateral digital diagnostic mammography and breast tomosynthesis was performed. The images were evaluated with computer-aided detection.; Targeted ultrasound examination of the left breast was performed. COMPARISON:  Previous exam(s). ACR Breast Density Category c: The breast tissue is heterogeneously dense, which may obscure small masses. FINDINGS: Mammogram: Right breast: No suspicious mass, distortion, or microcalcifications are identified to suggest presence of malignancy. Left breast: Spot compression tomosynthesis views of the left breast were performed in addition to standard views. There is an irregular mass with distortion in the upper outer left breast measuring approximately 1.4 cm. There are no additional suspicious findings elsewhere in the left breast. On physical exam I feel a subtle focal thickening in the upper slightly outer left breast. Ultrasound: Targeted ultrasound performed in the left breast at 1 o'clock 6 cm from the nipple demonstrating an irregular hypoechoic mass measuring 1.7 x 1.2 x 1.9 cm. There is internal vascularity. This corresponds to the mammographic finding. Targeted ultrasound the left axilla demonstrates normal lymph nodes. IMPRESSION: 1. Highly suspicious mass in the left breast at 1 o'clock measuring 1.9 cm. 2.  No mammographic evidence of malignancy in the right breast. RECOMMENDATION: Ultrasound-guided core needle biopsy of the left breast mass at 1 o'clock. I have discussed the findings and recommendations with the patient. The patient will be contacted by our scheduler to arrange the biopsy appointment. BI-RADS CATEGORY  5: Highly suggestive of malignancy. Electronically Signed   By: Audie Pinto M.D.   On: 11/19/2021 11:56  MM DIAG BREAST TOMO BILATERAL  Result Date: 11/19/2021 CLINICAL DATA:   80 year old female presenting for diagnostic evaluation of a nodule identified on chest CT in the left breast. The patient is currently undergoing chemotherapy for uterine cancer. EXAM: DIGITAL DIAGNOSTIC BILATERAL MAMMOGRAM WITH TOMOSYNTHESIS AND CAD;  ULTRASOUND LEFT BREAST LIMITED TECHNIQUE: Bilateral digital diagnostic mammography and breast tomosynthesis was performed. The images were evaluated with computer-aided detection.; Targeted ultrasound examination of the left breast was performed. COMPARISON:  Previous exam(s). ACR Breast Density Category c: The breast tissue is heterogeneously dense, which may obscure small masses. FINDINGS: Mammogram: Right breast: No suspicious mass, distortion, or microcalcifications are identified to suggest presence of malignancy. Left breast: Spot compression tomosynthesis views of the left breast were performed in addition to standard views. There is an irregular mass with distortion in the upper outer left breast measuring approximately 1.4 cm. There are no additional suspicious findings elsewhere in the left breast. On physical exam I feel a subtle focal thickening in the upper slightly outer left breast. Ultrasound: Targeted ultrasound performed in the left breast at 1 o'clock 6 cm from the nipple demonstrating an irregular hypoechoic mass measuring 1.7 x 1.2 x 1.9 cm. There is internal vascularity. This corresponds to the mammographic finding. Targeted ultrasound the left axilla demonstrates normal lymph nodes. IMPRESSION: 1. Highly suspicious mass in the left breast at 1 o'clock measuring 1.9 cm. 2.  No mammographic evidence of malignancy in the right breast. RECOMMENDATION: Ultrasound-guided core needle biopsy of the left breast mass at 1 o'clock. I have discussed the findings and recommendations with the patient. The patient will be contacted by our scheduler to arrange the biopsy appointment. BI-RADS CATEGORY  5: Highly suggestive of malignancy. Electronically Signed    By: Audie Pinto M.D.   On: 11/19/2021 11:56  IR IMAGING GUIDED PORT INSERTION  Result Date: 11/07/2021 INDICATION: Endometrial malignancy EXAM: IMPLANTED PORT A CATH PLACEMENT WITH ULTRASOUND AND FLUOROSCOPIC GUIDANCE MEDICATIONS: None ANESTHESIA/SEDATION: Moderate (conscious) sedation was employed during this procedure. A total of Versed 2 mg and Fentanyl 100 mcg was administered intravenously. Moderate Sedation Time: 17 minutes. The patient's level of consciousness and vital signs were monitored continuously by radiology nursing throughout the procedure under my direct supervision. FLUOROSCOPY TIME:  0 minutes, 18 seconds (1 mGy) COMPLICATIONS: None immediate. PROCEDURE: The procedure, risks, benefits, and alternatives were explained to the patient. Questions regarding the procedure were encouraged and answered. The patient understands and consents to the procedure. A timeout was performed prior to the initiation of the procedure. Patient positioned supine on the angiography table. Right neck and anterior upper chest prepped and draped in the usual sterile fashion. All elements of maximal sterile barrier were utilized including, cap, mask, sterile gown, sterile gloves, large sterile drape, hand scrubbing and 2% Chlorhexidine for skin cleaning. The right internal jugular vein was evaluated with ultrasound and shown to be patent. A permanent ultrasound image was obtained and placed in the patient's medical record. Local anesthesia was provided with 1% lidocaine with epinephrine. Using sterile gel and a sterile probe cover, the right internal jugular vein was entered with a 21 ga needle during real time ultrasound guidance. 0.018 inch guidewire placed and 21 ga needle exchanged for transitional dilator set. Utilizing fluoroscopy, 0.035 inch guidewire advanced through the needle without difficulty. Attention then turned to the right anterior upper chest. Following local lidocaine administration, a port  pocket was created. The catheter was connected to the port and brought from the pocket to the venotomy site through a subcutaneous tunnel. The catheter was cut to size and inserted through the peel-away sheath. The catheter tip was positioned at the cavoatrial junction using fluoroscopic guidance. The port aspirated and flushed well. The port pocket was closed with deep and superficial absorbable suture. The port pocket  incision and venotomy sites were also sealed with Dermabond. IMPRESSION: Successful placement of a right internal jugular approach power injectable Port-A-Cath. The catheter is ready for immediate use. Electronically Signed   By: Miachel Roux M.D.   On: 11/07/2021 12:32   MM Outside Films Mammo  Result Date: 11/06/2021 This examination belongs to an outside facility and is stored here for comparison purposes only.  Contact the originating outside institution for any associated report or interpretation.  MM Outside Films Mammo  Result Date: 11/06/2021 This examination belongs to an outside facility and is stored here for comparison purposes only.  Contact the originating outside institution for any associated report or interpretation.  MM Outside Films Mammo  Result Date: 11/06/2021 This examination belongs to an outside facility and is stored here for comparison purposes only.  Contact the originating outside institution for any associated report or interpretation.  MM Outside Films Mammo  Result Date: 11/06/2021 This examination belongs to an outside facility and is stored here for comparison purposes only.  Contact the originating outside institution for any associated report or interpretation.    ASSESSMENT & PLAN: Patient is a 80 y.o. female with history of uterine cancer currently undergoing chemotherapy followed by Dr. Alvy Bimler.  #) Port redness- atient is nontoxic-appearing.  Vital signs show she is afebrile, hemodynamically stable.  No tachycardia. Port site is showing  signs of superficial infection still, was initially improving however is worse now per patient. No tenderness on palpation. No drainage. Labs collected today and I viewed results. CBC shows WBC 3.2 and ANC 1.0. CMP unremarkable.  Patient had improvement initially, the timing of improvement makes me think IV antibiotics helped. She is scheduled for breast biopsy tomorrow for recent concerning findings to be suspicious of breast cancer. She is adamant about having this performed as it has already been delayed once before. I recommended admission for IV antibiotics however patient is wanting to continue trying PO antibiotics and come back for recheck in 2 days as  to not delay biopsy. I had lengthy conversation about the risks of waiting and worsening infection that could delay her next chemo scheduled for 12/04/21. Patient understands and is able to make her own informed decisions. I discussed strict ED precautions should anything worsen prior to recheck appointment here in clinic scheduled in 2 days.  #)Uterine cancer- has appointment scheduled with oncologist prior to next treatment 12/04/21 that she plans to keep. Patient aware if there is ongoing infection in [place] it might delay treatment.   Visit Diagnosis: 1. Port or reservoir infection, subsequent encounter   2. 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   All questions were answered. The patient knows to call the clinic with any problems, questions or concerns. No barriers to learning was detected.  I have spent a total of 30 minutes minutes of face-to-face and non-face-to-face time, preparing to see the patient, obtaining and/or reviewing separately obtained history, performing a medically appropriate examination, counseling and educating the patient, ordering tests,  documenting clinical information in the electronic health record, and care coordination.     Thank you for allowing me to  participate in the care of this patient.    Barrie Folk, PA-C Department of Hematology/Oncology [facility] Phone: [phone]  [phone]    11/26/2021 1:08 PM

## 2021-11-27 ENCOUNTER — Ambulatory Visit
Admission: RE | Admit: 2021-11-27 | Discharge: 2021-11-27 | Disposition: A | Discharge status: Home / Self Care | Payer: Medicare Other | Source: Ambulatory Visit | Attending: Gynecologic Oncology | Admitting: Gynecologic Oncology

## 2021-11-27 DIAGNOSIS — R928 Other abnormal and inconclusive findings on diagnostic imaging of breast: Secondary | ICD-10-CM | POA: Diagnosis not present

## 2021-11-27 DIAGNOSIS — N6320 Unspecified lump in the left breast, unspecified quadrant: Secondary | ICD-10-CM | POA: Diagnosis present

## 2021-11-27 HISTORY — PX: BREAST BIOPSY: SHX20

## 2021-11-28 ENCOUNTER — Other Ambulatory Visit: Payer: Self-pay

## 2021-11-28 ENCOUNTER — Inpatient Hospital Stay (HOSPITAL_BASED_OUTPATIENT_CLINIC_OR_DEPARTMENT_OTHER): Payer: Medicare Other | Admitting: Physician Assistant

## 2021-11-28 ENCOUNTER — Inpatient Hospital Stay: Payer: Medicare Other

## 2021-11-28 ENCOUNTER — Telehealth: Payer: Self-pay | Admitting: *Deleted

## 2021-11-28 VITALS — BP 176/90 | HR 76 | Temp 98.0°F | Resp 17 | Wt 179.4 lb

## 2021-11-28 DIAGNOSIS — Z5111 Encounter for antineoplastic chemotherapy: Secondary | ICD-10-CM | POA: Diagnosis not present

## 2021-11-28 DIAGNOSIS — T80212A Local infection due to central venous catheter, initial encounter: Secondary | ICD-10-CM | POA: Diagnosis not present

## 2021-11-28 DIAGNOSIS — C541 Malignant neoplasm of endometrium: Secondary | ICD-10-CM

## 2021-11-28 LAB — CBC WITH DIFFERENTIAL (CANCER CENTER ONLY)
Abs Immature Granulocytes: 0.04 10*3/uL (ref 0.00–0.07)
Basophils Absolute: 0 10*3/uL (ref 0.0–0.1)
Basophils Relative: 1 %
Eosinophils Absolute: 0.1 10*3/uL (ref 0.0–0.5)
Eosinophils Relative: 1 %
HCT: 36.8 % (ref 36.0–46.0)
Hemoglobin: 12 g/dL (ref 12.0–15.0)
Immature Granulocytes: 1 %
Lymphocytes Relative: 33 %
Lymphs Abs: 1.8 10*3/uL (ref 0.7–4.0)
MCH: 30 pg (ref 26.0–34.0)
MCHC: 32.6 g/dL (ref 30.0–36.0)
MCV: 92 fL (ref 80.0–100.0)
Monocytes Absolute: 0.5 10*3/uL (ref 0.1–1.0)
Monocytes Relative: 10 %
Neutro Abs: 3.1 10*3/uL (ref 1.7–7.7)
Neutrophils Relative %: 54 %
Platelet Count: 230 10*3/uL (ref 150–400)
RBC: 4 MIL/uL (ref 3.87–5.11)
RDW: 13 % (ref 11.5–15.5)
WBC Count: 5.6 10*3/uL (ref 4.0–10.5)
nRBC: 0 % (ref 0.0–0.2)

## 2021-11-28 LAB — CMP (CANCER CENTER ONLY)
ALT: 20 U/L (ref 0–44)
AST: 23 U/L (ref 15–41)
Albumin: 3.8 g/dL (ref 3.5–5.0)
Alkaline Phosphatase: 65 U/L (ref 38–126)
Anion gap: 8 (ref 5–15)
BUN: 14 mg/dL (ref 8–23)
CO2: 27 mmol/L (ref 22–32)
Calcium: 9.2 mg/dL (ref 8.9–10.3)
Chloride: 102 mmol/L (ref 98–111)
Creatinine: 0.88 mg/dL (ref 0.44–1.00)
GFR, Estimated: >60 mL/min (ref >60)
Glucose, Bld: 108 mg/dL — ABNORMAL HIGH (ref 70–99)
Potassium: 3.9 mmol/L (ref 3.5–5.1)
Sodium: 137 mmol/L (ref 135–145)
Total Bilirubin: 0.4 mg/dL (ref 0.3–1.2)
Total Protein: 7 g/dL (ref 6.5–8.1)

## 2021-11-28 MED ORDER — DOXYCYCLINE HYCLATE 100 MG PO TABS: 100.0000 mg | ORAL_TABLET | Freq: Two times a day (BID) | ORAL | 0 refills | Status: AC

## 2021-11-28 NOTE — Telephone Encounter (Signed)
Returned a call to [place] at Dr Saul Fordyce office regarding the patient. Patient requested that our office make a referral to [facility] for a breast surgeon for the patient. Melissa APP and Dr Alvy Bimler aware. Called Vaughan Basta back and explained that Dr Alvy Bimler is aware and working on that information.

## 2021-11-28 NOTE — Progress Notes (Signed)
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 Evaluation of the pelvis is significantly limited by dense metallic streak artifact from adjacent hip arthroplasty. Within this limitation, there is expansile, masslike, heterogeneously enhancing appearance of the endometrium, measuring at least 5.2 cm in thickness. Findings are in keeping with reported diagnosis of endometrial malignancy. 2. No evidence of lymphadenopathy or metastatic disease in the chest, abdomen, or pelvis. 3. There is a 0.4 cm fissural nodule the superior segment left lower lobe, almost certainly a benign intrapulmonary lymph node. Attention on follow-up. 4. Coronary artery disease.   10/14/2021 Surgery   TRH/BSO, right SLN biopsy, left pelvic and PA LND, peritoneal nodule biopsy, mini-lap for specimen delivery  Findings: On EUA, 8cm bulbous uterus. ON intra-abdominal entry, normal upper abdominal survey. Normal omentum, small and large bowel. Uterus 8-10cm and bulbous. Normal appearing adnexa. Mapping successful on the right, no mapping on the left. Some mildly prominent lymph nodes bilaterally in the pelvis. Small, <5 mm, nodule in the cul de sac. No obvious intra-abdominal or pelvic evidence of disease.   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 SENTINEL LYMPH NODE, RIGHT EXTERNAL ILIAC, BIOPSY:  - Lymph node, negative for carcinoma (0/1)   B. SENTINEL LYMPH NODE, RIGHT OBTURATOR AND SURROUNDING LYMPH NODES,  BIOPSY:  - Lymph nodes, negative for carcinoma (0/5)   C. UTERUS, CERVIX, BILATERAL FALLOPIAN TUBES AND OVARIES:  - Invasive high-grade serous carcinoma  - Carcinoma focally invades into the serosal surface  - Lymphovascular invasion is present  - Benign unremarkable cervix  - Benign unremarkable bilateral fallopian tubes and ovaries  - See oncology table   D. CUL DE SAC NODULE, POSTERIOR, BIOPSY:  - Endometriosis   E. LYMPH NODE, LEFT PELVIC, BIOPSY:  - Lymph nodes, negative for carcinoma (0/3)   F. LYMPH NODE, LEFT PARAORTIC, BIOPSY:  - Lymph nodes, negative for carcinoma (0/5)   ONCOLOGY TABLE:   UTERUS, CARCINOMA OR CARCINOSARCOMA: Resection   Procedure: Total hysterectomy and bilateral salpingo-oophorectomy  Histologic Type: Serous carcinoma  Histologic Grade: High-grade  Myometrial Invasion:       Depth of Myometrial Invasion (mm): 32 mm       Myometrial Thickness (mm): 32 mm       Percentage of Myometrial Invasion: 100%  Uterine Serosa Involvement: Present, focal  Cervical stromal Involvement: Not identified  Extent of involvement of other tissue/organs: Not identified  Peritoneal/Ascitic Fluid: Negative for carcinoma  Lymphovascular Invasion: Present  Regional Lymph Nodes:       Pelvic Lymph Nodes Examined:  6 Sentinel                                   3 Non-sentinel                                   9 Total       Pelvic Lymph Nodes with Metastasis: 0                           Macrometastasis: (>2.0 mm): 0                           Micrometastasis: (>0.2 mm and < 2.0 mm): 0                           Isolated  Tumor Cells (<0.2 mm): 0                           Laterality of Lymph Node with Tumor: Not  applicable                           Extracapsular Extension: Not applicable       Para-aortic Lymph Nodes Examined:                                    0 Sentinel                                    5 Non-sentinel                                    5 Total       Para-aortic Lymph Nodes with Metastasis: 0                           Macrometastasis: (>2.0 mm): 0                           Micrometastasis:  (>0.2 mm and < 2.0 mm): 0                           Isolated Tumor Cells (<0.2 mm): 0                           Laterality of Lymph Node with Tumor: Not  applicable                           Extracapsular Extension: Not applicable  Distant Metastasis:       Distant Site(s) Involved: Not applicable  Pathologic Stage Classification (pTNM, AJCC 8th Edition): pT3a, pN0  Ancillary Studies: MMR / MSI testing will be ordered  Representative Tumor Block: C4  Comment(s): Pancytokeratin was performed on the lymph nodes and is negative.   By immunohistochemistry, HER-2 is  EQUIVOCAL (2+).  HER-2 by FISH is pending and will be reported in an addendum.  ° °FLOURESCENCE IN-SITU HYBRIDIZATION RESULTS:  ° °GROUP 1:  HER2 **POSITIVE**  ° °On the tissue sample received from this individual HER2 FISH was performed by a technologist and cell imaging and analysis on the [employer].  ° °RATIO of HER2/CEN 17 SIGNALS: 2.50  °AVERAGE HER2 COPY NUMBER PER CELL: 4.74  ° °The ratio of HER2/CEN 17 result exceeds the cutoff value of >=2.0 and a copy number of HER2 signals exceeding the cutoff range of >=4.0 signals per cell.  Arch Pathol Lab Med 1:1, 2018.  °  °10/28/2021 Cancer Staging  ° Staging form: Corpus Uteri - Carcinoma and Carcinosarcoma, AJCC 8th Edition °- Pathologic stage from 10/28/2021: Stage III (pT3, pN0, cM0) - Signed by Gorsuch, Ni, MD on 10/28/2021 °Stage prefix: Initial diagnosis ° °  °11/07/2021 Procedure  ° Successful  placement of a right internal jugular approach power injectable Port-A-Cath. The catheter is ready for immediate use. °  °11/11/2021 -  Chemotherapy  ° Patient is on Treatment Plan : UTERINE Carboplatin AUC 6 / Paclitaxel q21d  °   ° ° °Current Therapy: day 1 cycle 1 carboplatin and paclitaxel 11/11/21 ° °Interval history- Kristin Carlson is a 80 yo female with oncologic history as stated above presenting to [facility] today with chief complaint of port site redness. This has been going on x 1 week. This is patient's 3rd visit for the same. She is due for last dose of doxycycline and keflex today. She has been applying warm compresses to the port. She has states the pain has improved, now only 3/10 in severity. She describes it as a soreness. Pain does not radiate. The redness has slightly improved she thinks. She continues to deny fever, chills, drainage from the site, bleeding, chest pain, shortness of breath, weakness, dizziness, nausea or vomiting. No OTC medications tried prior to arrival. ° ° ° ° °ROS  °All other systems are reviewed and are negative for acute change except as noted in the HPI. ° ° ° °Allergies  °Allergen Reactions  ° Lactose Other (See Comments)  °  Gi- Upset  ° Codeine Nausea And Vomiting  ° Penicillins Rash  ° Sulfa Antibiotics Rash  ° Vancomycin Itching and Rash  ° ° ° °Past Medical History:  °Diagnosis Date  ° Actinic keratosis 06/04/2021  ° right anteromedial thigh  ° Arthritis   ° Basal cell carcinoma 06/04/2021  ° right suprapubic, EDC 07/29/2021  ° Basal cell carcinoma 07/29/2021  ° left nasal ala. atypical basaloid cells  ° GERD (gastroesophageal reflux disease)   ° History of basal cell carcinoma 07/29/2021  ° left thigh, EDC at time of bx  ° HLD (hyperlipidemia)   ° Hypertension   ° Hypothyroidism   ° Pneumonia   ° HX OF YEARS AGO  ° PONV (postoperative nausea and vomiting)   ° ° ° °Past Surgical History:  °Procedure Laterality Date  ° BREAST BIOPSY Left 11/27/2021  ° u/s bs, venus clip, path  pending  ° BREAST CYST ASPIRATION    ° cataract Bilateral   ° HIP SURGERY    ° IR IMAGING GUIDED PORT INSERTION  11/07/2021  ° KNEE ARTHROSCOPY Left 2003  ° NECK SURGERY  1997  ° Fusion C3, C4, C5  ° ROBOTIC ASSISTED TOTAL HYSTERECTOMY WITH BILATERAL SALPINGO OOPHERECTOMY Bilateral 10/14/2021  ° Procedure: XI ROBOTIC ASSISTED TOTAL HYSTERECTOMY WITH BILATERAL SALPINGO OOPHORECTOMY, LYMPH NODE DISSECTION,  LAPAROTOMY;  Surgeon: Tucker,   Katherine R, MD;  Location: [facility];  Service: Gynecology;  Laterality: Bilateral;  ° SENTINEL NODE BIOPSY N/A 10/14/2021  ° Procedure: SENTINEL NODE DISSECTION;  Surgeon: Tucker, Katherine R, MD;  Location: [facility];  Service: Gynecology;  Laterality: N/A;  ° THYROIDECTOMY  2007  ° TOTAL HIP ARTHROPLASTY Bilateral   ° Left Hip -2015, Right Hip 208  ° ° °Social History  ° °Socioeconomic History  ° Marital status: Married  °  Spouse name: Robert  ° Number of children: 2  ° Years of education: Not on file  ° Highest education level: Not on file  °Occupational History  ° Not on file  °Tobacco Use  ° Smoking status: Former  °  Types: Cigarettes  ° Smokeless tobacco: Never  °Vaping Use  ° Vaping Use: Never used  °Substance and Sexual Activity  ° Alcohol use: Yes  °  Comment: RARE  ° Drug use: Never  ° Sexual activity: Not Currently  °Other Topics Concern  ° Not on file  °Social History Narrative  ° Retired RN  ° °Social Determinants of Health  ° °Financial Resource Strain: Not on file  °Food Insecurity: Not on file  °Transportation Needs: Not on file  °Physical Activity: Not on file  °Stress: Not on file  °Social Connections: Not on file  °Intimate Partner Violence: Not on file  ° ° °Family History  °Problem Relation Age of Onset  ° Cancer Father   °     Lung Cancer  ° Skin cancer Maternal Grandmother   ° Skin cancer Paternal Grandmother   ° Colon cancer Neg Hx   ° Breast cancer Neg Hx   ° Ovarian cancer Neg Hx   ° Endometrial cancer Neg Hx   ° Pancreatic cancer Neg Hx   ° Prostate cancer Neg  Hx   ° ° ° °Current Outpatient Medications:  °  [START ON 11/29/2021] doxycycline (VIBRA-TABS) 100 MG tablet, Take 1 tablet (100 mg total) by mouth 2 (two) times daily for 7 days., Disp: 14 tablet, Rfl: 0 °  acetaminophen (TYLENOL) 500 MG tablet, Take 1,000 mg by mouth every 6 (six) hours as needed for moderate pain., Disp: , Rfl:  °  Cholecalciferol 50 MCG (2000 UT) CAPS, Take 2,000 Units by mouth daily., Disp: , Rfl:  °  dexamethasone (DECADRON) 4 MG tablet, Take 2 tabs at the night before and 2 tab the morning of chemotherapy, every 3 weeks, by mouth x 6 cycles, Disp: 36 tablet, Rfl: 6 °  ibuprofen (ADVIL) 200 MG tablet, Take 400 mg by mouth every 8 (eight) hours as needed for moderate pain., Disp: , Rfl:  °  levothyroxine (SYNTHROID) 125 MCG tablet, Take 125 mcg by mouth daily before breakfast., Disp: , Rfl:  °  lidocaine-prilocaine (EMLA) cream, Apply to affected area once, Disp: 30 g, Rfl: 3 °  losartan (COZAAR) 25 MG tablet, Take 25 mg by mouth daily., Disp: , Rfl:  °  magnesium oxide (MAG-OX) 400 MG tablet, Take 400 mg by mouth daily., Disp: , Rfl:  °  Multiple Vitamins-Minerals (MULTIVITAMIN WOMEN 50+ PO), Take 1 tablet by mouth daily., Disp: , Rfl:  °  Omega-3 1000 MG CAPS, Take 1,000 mg by mouth daily., Disp: , Rfl:  °  ondansetron (ZOFRAN) 8 MG tablet, Take 1 tablet (8 mg total) by mouth every 8 (eight) hours as needed., Disp: 30 tablet, Rfl: 1 °  pantoprazole (PROTONIX) 20 MG tablet, Take 20 mg by mouth daily as needed for heartburn or   indigestion., Disp: , Rfl:  °  prochlorperazine (COMPAZINE) 10 MG tablet, Take 1 tablet (10 mg total) by mouth every 6 (six) hours as needed (Nausea or vomiting)., Disp: 30 tablet, Rfl: 1 °  propranolol (INDERAL) 40 MG tablet, Take 40 mg by mouth 2 (two) times daily., Disp: , Rfl:  °  rosuvastatin (CRESTOR) 5 MG tablet, Take 5 mg by mouth daily., Disp: , Rfl:  °  Simethicone 125 MG TABS, Take 250 mg by mouth 2 (two) times daily as needed (gas)., Disp: , Rfl:  °  temazepam  (RESTORIL) 15 MG capsule, Take 15 mg by mouth at bedtime as needed for sleep., Disp: , Rfl:  ° °PHYSICAL EXAM: °ECOG FS:1 - Symptomatic but completely ambulatory ° °  °Vitals:  ° 11/28/21 1408  °BP: (!) 176/90  °Pulse: 76  °Resp: 17  °Temp: 98 °F (36.7 °C)  °TempSrc: Oral  °SpO2: 100%  °Weight: 179 lb 6.4 oz (81.4 kg)  ° °Physical Exam °Vitals and nursing note reviewed.  °Constitutional:   °   Appearance: She is well-developed. She is not ill-appearing or toxic-appearing.  °HENT:  °   Head: Normocephalic and atraumatic.  °   Right Ear: External ear normal.  °   Left Ear: External ear normal.  °   Nose: Nose normal.  °Eyes:  °   General: No scleral icterus.    °   Right eye: No discharge.     °   Left eye: No discharge.  °   Conjunctiva/sclera: Conjunctivae normal.  °Neck:  °   Vascular: No JVD.  °Cardiovascular:  °   Rate and Rhythm: Normal rate and regular rhythm.  °   Pulses: Normal pulses.  °   Heart sounds: Normal heart sounds.  °Pulmonary:  °   Effort: Pulmonary effort is normal.  °Chest:  °   Comments: Patient gave verbal permission to utilize photo for medical documentation only. Please see media below. ° ° °Erythema overlying port without tenderness. No purulent drainage. No surrounding swelling. ° °Abdominal:  °   General: There is no distension.  °Musculoskeletal:     °   General: Normal range of motion.  °   Cervical back: Normal range of motion.  °Skin: °   General: Skin is warm and dry.  °Neurological:  °   Mental Status: She is oriented to person, place, and time.  °   GCS: GCS eye subscore is 4. GCS verbal subscore is 5. GCS motor subscore is 6.  °   Comments: Fluent speech, no facial droop.  °Psychiatric:     °   Behavior: Behavior normal.  °  ° ° ° ° °LABORATORY DATA: °I have reviewed the data as listed °CBC Latest Ref Rng & Units 11/28/2021 11/26/2021 11/21/2021  °WBC 4.0 - 10.5 K/uL 5.6 3.2(L) 2.0(L)  °Hemoglobin 12.0 - 15.0 g/dL 12.0 11.9(L) 12.5  °Hematocrit 36.0 - 46.0 % 36.8 36.7 38.6   °Platelets 150 - 400 K/uL 230 228 186  ° ° ° °CMP Latest Ref Rng & Units 11/28/2021 11/26/2021 11/21/2021  °Glucose 70 - 99 mg/dL 108(H) 96 97  °BUN 8 - 23 mg/dL 14 15 17  °Creatinine 0.44 - 1.00 mg/dL 0.88 0.94 0.81  °Sodium 135 - 145 mmol/L 137 137 137  °Potassium 3.5 - 5.1 mmol/L 3.9 4.3 4.0  °Chloride 98 - 111 mmol/L 102 103 105  °CO2 22 - 32 mmol/L 27 27 25  °Calcium 8.9 - 10.3 mg/dL 9.2 9.1 8.8(L)  °  Total Protein 6.5 - 8.1 g/dL 7.0 6.9 7.1  °Total Bilirubin 0.3 - 1.2 mg/dL 0.4 0.4 0.5  °Alkaline Phos 38 - 126 U/L 65 65 69  °AST 15 - 41 U/L 23 26 33  °ALT 0 - 44 U/L 20 23 36  ° ° ° ° ° °RADIOGRAPHIC STUDIES: °I have personally reviewed the radiological images as listed and agreed with the findings in the report. °No images are attached to the encounter. °US BREAST LTD UNI LEFT INC AXILLA ° °Result Date: 11/19/2021 °CLINICAL DATA:  80-year-old female presenting for diagnostic evaluation of a nodule identified on chest CT in the left breast. The patient is currently undergoing chemotherapy for uterine cancer. EXAM: DIGITAL DIAGNOSTIC BILATERAL MAMMOGRAM WITH TOMOSYNTHESIS AND CAD; ULTRASOUND LEFT BREAST LIMITED TECHNIQUE: Bilateral digital diagnostic mammography and breast tomosynthesis was performed. The images were evaluated with computer-aided detection.; Targeted ultrasound examination of the left breast was performed. COMPARISON:  Previous exam(s). ACR Breast Density Category c: The breast tissue is heterogeneously dense, which may obscure small masses. FINDINGS: Mammogram: Right breast: No suspicious mass, distortion, or microcalcifications are identified to suggest presence of malignancy. Left breast: Spot compression tomosynthesis views of the left breast were performed in addition to standard views. There is an irregular mass with distortion in the upper outer left breast measuring approximately 1.4 cm. There are no additional suspicious findings elsewhere in the left breast. On physical exam I feel a  subtle focal thickening in the upper slightly outer left breast. Ultrasound: Targeted ultrasound performed in the left breast at 1 o'clock 6 cm from the nipple demonstrating an irregular hypoechoic mass measuring 1.7 x 1.2 x 1.9 cm. There is internal vascularity. This corresponds to the mammographic finding. Targeted ultrasound the left axilla demonstrates normal lymph nodes. IMPRESSION: 1. Highly suspicious mass in the left breast at 1 o'clock measuring 1.9 cm. 2.  No mammographic evidence of malignancy in the right breast. RECOMMENDATION: Ultrasound-guided core needle biopsy of the left breast mass at 1 o'clock. I have discussed the findings and recommendations with the patient. The patient will be contacted by our scheduler to arrange the biopsy appointment. BI-RADS CATEGORY  5: Highly suggestive of malignancy. Electronically Signed   By: Nancy  Ballantyne M.D.   On: 11/19/2021 11:56 ° °MM DIAG BREAST TOMO BILATERAL ° °Result Date: 11/19/2021 °CLINICAL DATA:  80-year-old female presenting for diagnostic evaluation of a nodule identified on chest CT in the left breast. The patient is currently undergoing chemotherapy for uterine cancer. EXAM: DIGITAL DIAGNOSTIC BILATERAL MAMMOGRAM WITH TOMOSYNTHESIS AND CAD; ULTRASOUND LEFT BREAST LIMITED TECHNIQUE: Bilateral digital diagnostic mammography and breast tomosynthesis was performed. The images were evaluated with computer-aided detection.; Targeted ultrasound examination of the left breast was performed. COMPARISON:  Previous exam(s). ACR Breast Density Category c: The breast tissue is heterogeneously dense, which may obscure small masses. FINDINGS: Mammogram: Right breast: No suspicious mass, distortion, or microcalcifications are identified to suggest presence of malignancy. Left breast: Spot compression tomosynthesis views of the left breast were performed in addition to standard views. There is an irregular mass with distortion in the upper outer left breast  measuring approximately 1.4 cm. There are no additional suspicious findings elsewhere in the left breast. On physical exam I feel a subtle focal thickening in the upper slightly outer left breast. Ultrasound: Targeted ultrasound performed in the left breast at 1 o'clock 6 cm from the nipple demonstrating an irregular hypoechoic mass measuring 1.7 x 1.2 x 1.9 cm. There is internal vascularity. This corresponds to   the mammographic finding. Targeted ultrasound the left axilla demonstrates normal lymph nodes. IMPRESSION: 1. Highly suspicious mass in the left breast at 1 o'clock measuring 1.9 cm. 2.  No mammographic evidence of malignancy in the right breast. RECOMMENDATION: Ultrasound-guided core needle biopsy of the left breast mass at 1 o'clock. I have discussed the findings and recommendations with the patient. The patient will be contacted by our scheduler to arrange the biopsy appointment. BI-RADS CATEGORY  5: Highly suggestive of malignancy. Electronically Signed   By: Nancy  Ballantyne M.D.   On: 11/19/2021 11:56 ° °MM CLIP PLACEMENT LEFT ° °Result Date: 11/27/2021 °CLINICAL DATA:  Status post ultrasound-guided core biopsy of LEFT breast mass. EXAM: 3D DIAGNOSTIC LEFT MAMMOGRAM POST ULTRASOUND BIOPSY COMPARISON:  Previous exam(s). FINDINGS: 3D Mammographic images were obtained following ultrasound guided biopsy of mass in the 1 o'clock location of the LEFT breast and placement of a Venus shaped clip. The biopsy marking clip is in expected position at the site of biopsy. IMPRESSION: Appropriate positioning of the venous shaped biopsy marking clip at the site of biopsy in the 1 o'clock location of the LEFT breast. Final Assessment: Post Procedure Mammograms for Marker Placement Electronically Signed   By: Elizabeth  Brown M.D.   On: 11/27/2021 14:10 ° °US LT BREAST BX W LOC DEV 1ST LESION IMG BX SPEC US GUIDE ° °Result Date: 11/27/2021 °CLINICAL DATA:  Patient presents for ultrasound-guided core biopsy of LEFT  breast mass. EXAM: ULTRASOUND GUIDED LEFT BREAST CORE NEEDLE BIOPSY COMPARISON:  Previous exam(s). PROCEDURE: I met with the patient and we discussed the procedure of ultrasound-guided biopsy, including benefits and alternatives. We discussed the high likelihood of a successful procedure. We discussed the risks of the procedure, including infection, bleeding, tissue injury, clip migration, and inadequate sampling. Informed written consent was given. The usual time-out protocol was performed immediately prior to the procedure. Lesion quadrant: UPPER-OUTER QUADRANT LEFT breast Using sterile technique and 1% Lidocaine as local anesthetic, under direct ultrasound visualization, a 12 gauge spring-loaded device was used to perform biopsy of mass in the 1 o'clock location of the LEFT breast using a LATERAL to MEDIAL approach. At the conclusion of the procedure Venus tissue marker clip was deployed into the biopsy cavity. Follow up 2 view mammogram was performed and dictated separately. IMPRESSION: Ultrasound guided biopsy of LEFT breast mass. No apparent complications. Electronically Signed   By: Elizabeth  Brown M.D.   On: 11/27/2021 14:08 ° °IR IMAGING GUIDED PORT INSERTION ° °Result Date: 11/07/2021 °INDICATION: Endometrial malignancy EXAM: IMPLANTED PORT A CATH PLACEMENT WITH ULTRASOUND AND FLUOROSCOPIC GUIDANCE MEDICATIONS: None ANESTHESIA/SEDATION: Moderate (conscious) sedation was employed during this procedure. A total of Versed 2 mg and Fentanyl 100 mcg was administered intravenously. Moderate Sedation Time: 17 minutes. The patient's level of consciousness and vital signs were monitored continuously by radiology nursing throughout the procedure under my direct supervision. FLUOROSCOPY TIME:  0 minutes, 18 seconds (1 mGy) COMPLICATIONS: None immediate. PROCEDURE: The procedure, risks, benefits, and alternatives were explained to the patient. Questions regarding the procedure were encouraged and answered. The patient  understands and consents to the procedure. A timeout was performed prior to the initiation of the procedure. Patient positioned supine on the angiography table. Right neck and anterior upper chest prepped and draped in the usual sterile fashion. All elements of maximal sterile barrier were utilized including, cap, mask, sterile gown, sterile gloves, large sterile drape, hand scrubbing and 2% Chlorhexidine for skin cleaning. The right internal jugular vein was evaluated with ultrasound   and shown to be patent. A permanent ultrasound image was obtained and placed in the patient's medical record. Local anesthesia was provided with 1% lidocaine with epinephrine. Using sterile gel and a sterile probe cover, the right internal jugular vein was entered with a 21 ga needle during real time ultrasound guidance. 0.018 inch guidewire placed and 21 ga needle exchanged for transitional dilator set. Utilizing fluoroscopy, 0.035 inch guidewire advanced through the needle without difficulty. Attention then turned to the right anterior upper chest. Following local lidocaine administration, a port pocket was created. The catheter was connected to the port and brought from the pocket to the venotomy site through a subcutaneous tunnel. The catheter was cut to size and inserted through the peel-away sheath. The catheter tip was positioned at the cavoatrial junction using fluoroscopic guidance. The port aspirated and flushed well. The port pocket was closed with deep and superficial absorbable suture. The port pocket incision and venotomy sites were also sealed with Dermabond. IMPRESSION: Successful placement of a right internal jugular approach power injectable Port-A-Cath. The catheter is ready for immediate use. Electronically Signed   By: Farhaan  Mir M.D.   On: 11/07/2021 12:32  ° °MM Outside Films Mammo ° °Result Date: 11/06/2021 °This examination belongs to an outside facility and is stored here for comparison purposes only.   Contact the originating outside institution for any associated report or interpretation. ° °MM Outside Films Mammo ° °Result Date: 11/06/2021 °This examination belongs to an outside facility and is stored here for comparison purposes only.  Contact the originating outside institution for any associated report or interpretation. ° °MM Outside Films Mammo ° °Result Date: 11/06/2021 °This examination belongs to an outside facility and is stored here for comparison purposes only.  Contact the originating outside institution for any associated report or interpretation. ° °MM Outside Films Mammo ° °Result Date: 11/06/2021 °This examination belongs to an outside facility and is stored here for comparison purposes only.  Contact the originating outside institution for any associated report or interpretation.   ° °ASSESSMENT & PLAN: °Patient is a 80 y.o. female with history of uterine cancer currently undergoing chemotherapy followed by Dr. Gorsuch. ° °#)Port redness- Patient non toxic appearing, VSS. Port site looks mildly improved, still have overly erythema. It is reassuring that pain has improved. CBC shows WBC 5.6 and ANC 3.0 which is an improvement compared to labs from x 2 days ago. Discussed case with oncologist Dr. Gorsuch who agrees with plan for additional antibiotic coverage with doxycycline x 1 additional week. Patient is to finish 1 week of doxycyline and keflex today. Patient has appointment 12/04/21 with Dr. Gorsuch she plans to use for recheck of port. Strict ED precautions discussed. Patient is agreeable with plan of care. °  °Visit Diagnosis: °1. Port or reservoir infection, initial encounter   ° ° ° °No orders of the defined types were placed in this encounter. ° ° °All questions were answered. The patient knows to call the clinic with any problems, questions or concerns. No barriers to learning was detected. ° °I have spent a total of 25 minutes minutes of face-to-face and non-face-to-face time, preparing  to see the patient, obtaining and/or reviewing separately obtained history, performing a medically appropriate examination, counseling and educating the patient, ordering tests,  documenting clinical information in the electronic health record, and care coordination.  °  ° °Thank you for allowing me to participate in the care of this patient.  ° ° °Kaitlyn E  Walisiewicz, PA-C °Department of   Hematology/Oncology °[facility] °Phone: [phone]  °[phone]   ° °11/28/2021 °4:09 PM ° ° ° °  ° °

## 2021-11-30 ENCOUNTER — Encounter: Payer: Self-pay | Admitting: Hematology and Oncology

## 2021-12-02 ENCOUNTER — Encounter: Payer: Self-pay | Admitting: Hematology and Oncology

## 2021-12-02 DIAGNOSIS — C50412 Malignant neoplasm of upper-outer quadrant of left female breast: Secondary | ICD-10-CM | POA: Insufficient documentation

## 2021-12-03 MED FILL — Fosaprepitant Dimeglumine For IV Infusion 150 MG (Base Eq): INTRAVENOUS | Qty: 5 | Status: AC

## 2021-12-03 MED FILL — Dexamethasone Sodium Phosphate Inj 100 MG/10ML: INTRAMUSCULAR | Qty: 1 | Status: AC

## 2021-12-04 ENCOUNTER — Encounter: Payer: Self-pay | Admitting: Oncology

## 2021-12-04 ENCOUNTER — Inpatient Hospital Stay (HOSPITAL_BASED_OUTPATIENT_CLINIC_OR_DEPARTMENT_OTHER): Payer: Medicare Other | Admitting: Hematology and Oncology

## 2021-12-04 ENCOUNTER — Other Ambulatory Visit: Payer: Self-pay

## 2021-12-04 ENCOUNTER — Other Ambulatory Visit: Payer: Self-pay | Admitting: Hematology and Oncology

## 2021-12-04 ENCOUNTER — Encounter: Payer: Self-pay | Admitting: Hematology and Oncology

## 2021-12-04 ENCOUNTER — Inpatient Hospital Stay: Payer: Medicare Other | Attending: Gynecologic Oncology

## 2021-12-04 ENCOUNTER — Inpatient Hospital Stay: Payer: Medicare Other

## 2021-12-04 DIAGNOSIS — T451X5A Adverse effect of antineoplastic and immunosuppressive drugs, initial encounter: Secondary | ICD-10-CM | POA: Diagnosis not present

## 2021-12-04 DIAGNOSIS — C541 Malignant neoplasm of endometrium: Secondary | ICD-10-CM

## 2021-12-04 DIAGNOSIS — Z5111 Encounter for antineoplastic chemotherapy: Secondary | ICD-10-CM | POA: Insufficient documentation

## 2021-12-04 DIAGNOSIS — Z510 Encounter for antineoplastic radiation therapy: Secondary | ICD-10-CM | POA: Diagnosis present

## 2021-12-04 DIAGNOSIS — G620 Drug-induced polyneuropathy: Secondary | ICD-10-CM

## 2021-12-04 DIAGNOSIS — C50412 Malignant neoplasm of upper-outer quadrant of left female breast: Secondary | ICD-10-CM

## 2021-12-04 DIAGNOSIS — C4491 Basal cell carcinoma of skin, unspecified: Secondary | ICD-10-CM | POA: Diagnosis not present

## 2021-12-04 LAB — CBC WITH DIFFERENTIAL (CANCER CENTER ONLY)
Abs Immature Granulocytes: 0.03 10*3/uL (ref 0.00–0.07)
Basophils Absolute: 0 10*3/uL (ref 0.0–0.1)
Basophils Relative: 0 %
Eosinophils Absolute: 0 10*3/uL (ref 0.0–0.5)
Eosinophils Relative: 0 %
HCT: 37.6 % (ref 36.0–46.0)
Hemoglobin: 12.5 g/dL (ref 12.0–15.0)
Immature Granulocytes: 0 %
Lymphocytes Relative: 19 %
Lymphs Abs: 1.3 10*3/uL (ref 0.7–4.0)
MCH: 30 pg (ref 26.0–34.0)
MCHC: 33.2 g/dL (ref 30.0–36.0)
MCV: 90.2 fL (ref 80.0–100.0)
Monocytes Absolute: 0.1 10*3/uL (ref 0.1–1.0)
Monocytes Relative: 1 %
Neutro Abs: 5.7 10*3/uL (ref 1.7–7.7)
Neutrophils Relative %: 80 %
Platelet Count: 246 10*3/uL (ref 150–400)
RBC: 4.17 MIL/uL (ref 3.87–5.11)
RDW: 13 % (ref 11.5–15.5)
WBC Count: 7.1 10*3/uL (ref 4.0–10.5)
nRBC: 0 % (ref 0.0–0.2)

## 2021-12-04 LAB — CMP (CANCER CENTER ONLY)
ALT: 15 U/L (ref 0–44)
AST: 21 U/L (ref 15–41)
Albumin: 4 g/dL (ref 3.5–5.0)
Alkaline Phosphatase: 77 U/L (ref 38–126)
Anion gap: 12 (ref 5–15)
BUN: 21 mg/dL (ref 8–23)
CO2: 21 mmol/L — ABNORMAL LOW (ref 22–32)
Calcium: 9.1 mg/dL (ref 8.9–10.3)
Chloride: 103 mmol/L (ref 98–111)
Creatinine: 0.91 mg/dL (ref 0.44–1.00)
GFR, Estimated: >60 mL/min (ref >60)
Glucose, Bld: 162 mg/dL — ABNORMAL HIGH (ref 70–99)
Potassium: 4.5 mmol/L (ref 3.5–5.1)
Sodium: 136 mmol/L (ref 135–145)
Total Bilirubin: 0.4 mg/dL (ref 0.3–1.2)
Total Protein: 7.4 g/dL (ref 6.5–8.1)

## 2021-12-04 LAB — SURGICAL PATHOLOGY

## 2021-12-04 MED ORDER — SODIUM CHLORIDE 0.9 % IV SOLN
150.0000 mg | Freq: Once | INTRAVENOUS | Status: AC
  Administered 2021-12-04: 150 mg via INTRAVENOUS
  Filled 2021-12-04: qty 150

## 2021-12-04 MED ORDER — SODIUM CHLORIDE 0.9 % IV SOLN
510.0000 mg | Freq: Once | INTRAVENOUS | Status: AC
  Administered 2021-12-04: 510 mg via INTRAVENOUS
  Filled 2021-12-04: qty 51

## 2021-12-04 MED ORDER — SODIUM CHLORIDE 0.9 % IV SOLN
10.0000 mg | Freq: Once | INTRAVENOUS | Status: AC
  Administered 2021-12-04: 10 mg via INTRAVENOUS
  Filled 2021-12-04: qty 10

## 2021-12-04 MED ORDER — PALONOSETRON HCL INJECTION 0.25 MG/5ML
0.2500 mg | Freq: Once | INTRAVENOUS | Status: AC
  Administered 2021-12-04: 0.25 mg via INTRAVENOUS
  Filled 2021-12-04: qty 5

## 2021-12-04 MED ORDER — SODIUM CHLORIDE 0.9% FLUSH: 10.0000 mL | Freq: Once | INTRAVENOUS | Status: DC

## 2021-12-04 MED ORDER — SODIUM CHLORIDE 0.9 % IV SOLN
131.2500 mg/m2 | Freq: Once | INTRAVENOUS | Status: AC
  Administered 2021-12-04: 252 mg via INTRAVENOUS
  Filled 2021-12-04: qty 42

## 2021-12-04 MED ORDER — SODIUM CHLORIDE 0.9 % IV SOLN: Freq: Once | INTRAVENOUS | Status: AC

## 2021-12-04 MED ORDER — DIPHENHYDRAMINE HCL 50 MG/ML IJ SOLN
25.0000 mg | Freq: Once | INTRAMUSCULAR | Status: AC
  Administered 2021-12-04: 25 mg via INTRAVENOUS
  Filled 2021-12-04: qty 1

## 2021-12-04 MED ORDER — FAMOTIDINE 20 MG IN NS 100 ML IVPB
20.0000 mg | Freq: Once | INTRAVENOUS | Status: AC
  Administered 2021-12-04: 20 mg via INTRAVENOUS
  Filled 2021-12-04: qty 100

## 2021-12-04 NOTE — Assessment & Plan Note (Signed)
She had recent incidental finding on imaging study and pursue further evaluation and was found to have early stage breast cancer Further molecular studies including ER, PR and HER2/neu testing are pending We discussed approach to management of breast cancer I will consult general surgery for evaluation and management but we will continue chemotherapy for uterine cancer as prescribed When she completes her chemotherapy treatment, she can proceed with lumpectomy with her surgeon Due to multiple cancer diagnosis, I recommend genetic counseling referral and genetic testing

## 2021-12-04 NOTE — Assessment & Plan Note (Signed)
She had multiple different complications from treatment but they are resolved I am concerned about risk of bone pain and infection again I recommend minor dose adjustment to Taxol She will also receive treatment through peripheral IV to allow further recovery from recent superficial infection of the skin over her port I do not recommend interruption of chemotherapy for work-up for her breast cancer I will see her again in 3 weeks for further follow-up

## 2021-12-04 NOTE — Patient Instructions (Signed)
[facility]  Discharge Instructions: Thank you for choosing [facility] to provide your oncology and hematology care.   If you have a lab appointment with the Cancer Center, please go directly to the Cancer Center and check in at the registration area.   Wear comfortable clothing and clothing appropriate for easy access to any Portacath or PICC line.   We strive to give you quality time with your provider. You may need to reschedule your appointment if you arrive late (15 or more minutes).  Arriving late affects you and other patients whose appointments are after yours.  Also, if you miss three or more appointments without notifying the office, you may be dismissed from the clinic at the provider's discretion.      For prescription refill requests, have your pharmacy contact our office and allow 72 hours for refills to be completed.    Today you received the following chemotherapy and/or immunotherapy agents: Paclitaxel (Taxol) and Carboplatin.   To help prevent nausea and vomiting after your treatment, we encourage you to take your nausea medication as directed.  BELOW ARE SYMPTOMS THAT SHOULD BE REPORTED IMMEDIATELY: *FEVER GREATER THAN 100.4 F (38 C) OR HIGHER *CHILLS OR SWEATING *NAUSEA AND VOMITING THAT IS NOT CONTROLLED WITH YOUR NAUSEA MEDICATION *UNUSUAL SHORTNESS OF BREATH *UNUSUAL BRUISING OR BLEEDING *URINARY PROBLEMS (pain or burning when urinating, or frequent urination) *BOWEL PROBLEMS (unusual diarrhea, constipation, pain near the anus) TENDERNESS IN MOUTH AND THROAT WITH OR WITHOUT PRESENCE OF ULCERS (sore throat, sores in mouth, or a toothache) UNUSUAL RASH, SWELLING OR PAIN  UNUSUAL VAGINAL DISCHARGE OR ITCHING   Items with * indicate a potential emergency and should be followed up as soon as possible or go to the Emergency Department if any problems should occur.  Please show the CHEMOTHERAPY ALERT CARD or IMMUNOTHERAPY  ALERT CARD at check-in to the Emergency Department and triage nurse.  Should you have questions after your visit or need to cancel or reschedule your appointment, please contact [facility]  Dept: [phone]  and follow the prompts.  Office hours are 8:00 a.m. to 4:30 p.m. Monday - Friday. Please note that voicemails left after 4:00 p.m. may not be returned until the following business day.  We are closed weekends and major holidays. You have access to a nurse at all times for urgent questions. Please call the main number to the clinic Dept: [phone] and follow the prompts.   For any non-urgent questions, you may also contact your provider using MyChart. We now offer e-Visits for anyone 18 and older to request care online for non-urgent symptoms. For details visit [email or web address].   Also download the MyChart app! Go to the app store, search "MyChart", open the app, select [facility], and log in with your MyChart username and password.  Due to Covid, a mask is required upon entering the hospital/clinic. If you do not have a mask, one will be given to you upon arrival. For doctor visits, patients may have 1 support person aged 18 or older with them. For treatment visits, patients cannot have anyone with them due to current Covid guidelines and our immunocompromised population.   

## 2021-12-04 NOTE — Progress Notes (Unsigned)
Per Dr.Gorsuch, pt to get labs and treatment peripherally today while port heals more from past infection.

## 2021-12-04 NOTE — Assessment & Plan Note (Signed)
she has mild peripheral neuropathy, likely related to side effects of treatment. °I plan to reduce the dose of treatment as outlined above.  °I explained to the patient the rationale of this strategy and reassured the patient it would not compromise the efficacy of treatment ° °

## 2021-12-04 NOTE — Progress Notes (Signed)
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[facility]) She had multiple different complications from treatment but they are resolved I am concerned about risk of bone pain and infection again I recommend minor dose adjustment to Taxol She will also receive treatment through peripheral IV to allow further recovery from recent superficial infection of the skin over her port I do not recommend interruption of chemotherapy for work-up for her breast cancer I will see her again in 3 weeks for further follow-up  Breast cancer of upper-outer quadrant of left female breast [facility]) She had recent incidental finding on imaging study and pursue further evaluation and was found to have early stage breast cancer Further molecular studies including ER, PR and HER2/neu testing are pending We discussed approach to management of breast cancer I will consult general surgery for evaluation and management but we will continue chemotherapy for uterine cancer as prescribed When she completes her chemotherapy treatment, she can proceed with lumpectomy with her surgeon Due to multiple cancer diagnosis, I recommend genetic counseling referral and genetic testing  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 I plan to reduce the dose of treatment as outlined above.  I explained to the patient the rationale of this strategy and reassured the patient it would not compromise the efficacy of treatment   Orders Placed This Encounter  Procedures   Ambulatory referral to General Surgery    Referral Priority:   Routine    Referral Type:   Surgical    Referral Reason:   Specialty Services Required    Requested Specialty:   General Surgery    Number of Visits  Requested:   1    All questions were answered. The patient knows to call the clinic with any problems, questions or concerns. The total time spent in the appointment was 40 minutes encounter with patients including review of chart and various tests results, discussions about plan of care and coordination of care plan   Heath Lark, MD 12/04/2021 2:24 PM  INTERVAL HISTORY: Please see below for problem oriented charting. she returns for treatment follow-up seen prior to cycle 2 of chemotherapy Her husband and daughter were both present She had significant evaluation due to skin infection over the port and appears to be responding well to antibiotics She was recently found to have breast cancer She had slight worsening neuropathy and bone pain with recent chemo Denies nausea or changes in bowel habits  REVIEW OF SYSTEMS:   Constitutional: Denies fevers, chills or abnormal weight loss Eyes: Denies blurriness of vision Ears, nose, mouth, throat, and face: Denies mucositis or sore throat Respiratory: Denies cough, dyspnea or wheezes Cardiovascular: Denies palpitation, chest discomfort or lower extremity swelling Gastrointestinal:  Denies nausea, heartburn or change in bowel habits Skin: Denies abnormal skin rashes Lymphatics: Denies new lymphadenopathy or easy bruising Neurological:Denies numbness, tingling or new weaknesses Behavioral/Psych: Mood is stable, no new changes  All other systems were reviewed with the patient and are negative.  I have reviewed the past medical history, past surgical history, social history and family history with the patient and they are unchanged from previous note.  ALLERGIES:  is allergic to lactose, codeine, penicillins, sulfa antibiotics, and vancomycin.  MEDICATIONS:  Current Outpatient Medications  Medication Sig Dispense Refill  levothyroxine (SYNTHROID) 137 MCG tablet Take 137 mcg by mouth daily before breakfast.     losartan (COZAAR) 50 MG tablet  Take 50 mg by mouth daily.     acetaminophen (TYLENOL) 500 MG tablet Take 1,000 mg by mouth every 6 (six) hours as needed for moderate pain.     Cholecalciferol 50 MCG (2000 UT) CAPS Take 2,000 Units by mouth daily.     dexamethasone (DECADRON) 4 MG tablet Take 2 tabs at the night before and 2 tab the morning of chemotherapy, every 3 weeks, by mouth x 6 cycles 36 tablet 6   doxycycline (VIBRA-TABS) 100 MG tablet Take 1 tablet (100 mg total) by mouth 2 (two) times daily for 7 days. 14 tablet 0   ibuprofen (ADVIL) 200 MG tablet Take 400 mg by mouth every 8 (eight) hours as needed for moderate pain.     lidocaine-prilocaine (EMLA) cream Apply to affected area once 30 g 3   magnesium oxide (MAG-OX) 400 MG tablet Take 400 mg by mouth daily.     Multiple Vitamins-Minerals (MULTIVITAMIN WOMEN 50+ PO) Take 1 tablet by mouth daily.     Omega-3 1000 MG CAPS Take 1,000 mg by mouth daily.     ondansetron (ZOFRAN) 8 MG tablet Take 1 tablet (8 mg total) by mouth every 8 (eight) hours as needed. 30 tablet 1   pantoprazole (PROTONIX) 20 MG tablet Take 20 mg by mouth daily as needed for heartburn or indigestion.     prochlorperazine (COMPAZINE) 10 MG tablet Take 1 tablet (10 mg total) by mouth every 6 (six) hours as needed (Nausea or vomiting). 30 tablet 1   propranolol (INDERAL) 40 MG tablet Take 40 mg by mouth 2 (two) times daily.     rosuvastatin (CRESTOR) 5 MG tablet Take 5 mg by mouth daily.     Simethicone 125 MG TABS Take 250 mg by mouth 2 (two) times daily as needed (gas).     temazepam (RESTORIL) 15 MG capsule Take 15 mg by mouth at bedtime as needed for sleep.     No current facility-administered medications for this visit.   Facility-Administered Medications Ordered in Other Visits  Medication Dose Route Frequency Provider Last Rate Last Admin   CARBOplatin (PARAPLATIN) 510 mg in sodium chloride 0.9 % 250 mL chemo infusion  510 mg Intravenous Once Alvy Bimler, Mykeisha Dysert, MD       PACLitaxel (TAXOL) 252 mg in  sodium chloride 0.9 % 250 mL chemo infusion (> $RemoveBef'80mg'NuIVmpkweq$ /m2)  131.25 mg/m2 (Treatment Plan Recorded) Intravenous Once Heath Lark, MD 107 mL/hr at 12/04/21 1344 Infusion Verify at 12/04/21 1344   sodium chloride flush (NS) 0.9 % injection 10 mL  10 mL Intracatheter Once Heath Lark, MD        SUMMARY OF ONCOLOGIC HISTORY: Oncology History Overview Note  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 Evaluation of the pelvis is significantly limited by dense metallic streak artifact from adjacent hip arthroplasty. Within this limitation, there is expansile, masslike, heterogeneously enhancing appearance of the endometrium, measuring at least 5.2 cm in thickness. Findings are in keeping with reported diagnosis of endometrial malignancy. 2. No evidence of lymphadenopathy or metastatic disease in the chest, abdomen, or pelvis. 3. There is a 0.4 cm fissural nodule the superior segment left  lower lobe, almost certainly a benign intrapulmonary lymph node. Attention on follow-up. 4. Coronary artery disease.   10/14/2021 Surgery   TRH/BSO, right SLN biopsy, left pelvic and PA LND, peritoneal nodule biopsy, mini-lap for specimen delivery  Findings: On EUA, 8cm bulbous uterus. ON intra-abdominal entry, normal upper abdominal survey. Normal omentum, small and large bowel. Uterus 8-10cm and bulbous. Normal appearing adnexa. Mapping successful on the right, no mapping on the left. Some mildly prominent lymph nodes bilaterally in the pelvis. Small, <5 mm, nodule in the cul de sac. No obvious intra-abdominal or pelvic evidence of disease.   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 SENTINEL LYMPH NODE, RIGHT EXTERNAL ILIAC, BIOPSY:  - Lymph node, negative for carcinoma (0/1)   B. SENTINEL LYMPH NODE, RIGHT OBTURATOR AND SURROUNDING LYMPH NODES,  BIOPSY:  - Lymph nodes, negative for carcinoma (0/5)   C. UTERUS, CERVIX, BILATERAL FALLOPIAN TUBES AND OVARIES:  - Invasive high-grade serous carcinoma  - Carcinoma focally invades into the serosal surface  - Lymphovascular invasion is present  - Benign unremarkable cervix  - Benign unremarkable bilateral fallopian tubes and ovaries  - See oncology table   D. CUL DE SAC NODULE, POSTERIOR, BIOPSY:  - Endometriosis   E. LYMPH NODE, LEFT PELVIC, BIOPSY:  - Lymph nodes, negative for carcinoma (0/3)   F. LYMPH NODE, LEFT PARAORTIC, BIOPSY:  - Lymph nodes, negative for carcinoma (0/5)   ONCOLOGY TABLE:   UTERUS, CARCINOMA OR CARCINOSARCOMA: Resection   Procedure: Total hysterectomy and bilateral salpingo-oophorectomy  Histologic Type: Serous carcinoma  Histologic Grade: High-grade  Myometrial Invasion:       Depth of Myometrial Invasion (mm): 32 mm       Myometrial Thickness (mm): 32 mm       Percentage of Myometrial Invasion: 100%  Uterine Serosa Involvement: Present, focal  Cervical stromal Involvement: Not identified  Extent of involvement of other tissue/organs: Not identified  Peritoneal/Ascitic Fluid: Negative for carcinoma  Lymphovascular Invasion: Present  Regional Lymph Nodes:       Pelvic Lymph Nodes Examined:                                   6 Sentinel                                   3 Non-sentinel                                   9 Total       Pelvic Lymph Nodes with Metastasis: 0                           Macrometastasis: (>2.0 mm): 0                           Micrometastasis: (>0.2 mm and < 2.0 mm): 0  Isolated Tumor Cells (<0.2 mm): 0                            Laterality of Lymph Node with Tumor: Not  applicable                           Extracapsular Extension: Not applicable       Para-aortic Lymph Nodes Examined:                                    0 Sentinel                                    5 Non-sentinel                                    5 Total       Para-aortic Lymph Nodes with Metastasis: 0                           Macrometastasis: (>2.0 mm): 0                           Micrometastasis:  (>0.2 mm and < 2.0 mm): 0                           Isolated Tumor Cells (<0.2 mm): 0                           Laterality of Lymph Node with Tumor: Not  applicable                           Extracapsular Extension: Not applicable  Distant Metastasis:       Distant Site(s) Involved: Not applicable  Pathologic Stage Classification (pTNM, AJCC 8th Edition): pT3a, pN0  Ancillary Studies: MMR / MSI testing will be ordered  Representative Tumor Block: C4  Comment(s): Pancytokeratin was performed on the lymph nodes and is negative.   By immunohistochemistry, HER-2 is EQUIVOCAL (2+).  HER-2 by FISH is pending and will be reported in an addendum.   FLOURESCENCE IN-SITU HYBRIDIZATION RESULTS:   GROUP 1:  HER2 **POSITIVE**   On the tissue sample received from this individual HER2 FISH was performed by a technologist and cell imaging and analysis on the Bio[employer].   RATIO of HER2/CEN 17 SIGNALS: 2.50  AVERAGE HER2 COPY NUMBER PER CELL: 4.74   The ratio of HER2/CEN 17 result exceeds the cutoff value of >=2.0 and a copy number of HER2 signals exceeding the cutoff range of >=4.0 signals per cell.  Arch Pathol Lab Med 1:1, 2018.    10/28/2021 Cancer Staging   Staging form: Corpus Uteri - Carcinoma and Carcinosarcoma, AJCC 8th Edition - Pathologic stage from 10/28/2021: Stage III (pT3, pN0, cM0) - Signed by Heath Lark, MD on 10/28/2021 Stage prefix: Initial diagnosis    11/07/2021 Procedure   Successful placement of a right internal jugular  approach power injectable Port-A-Cath. The catheter is ready for immediate use.   11/11/2021 -  Chemotherapy   Patient is on Treatment Plan : UTERINE Carboplatin AUC 6 / Paclitaxel q21d     Breast cancer of upper-outer quadrant of left female breast ([place])  11/19/2021 Imaging   IMPRESSION: 1. Highly suspicious mass in the left breast at 1 o'clock measuring 1.9 cm.   2.  No mammographic evidence of malignancy in the right breast   11/27/2021 Procedure   Appropriate positioning of the venous shaped biopsy marking clip at the site of biopsy in the 1 o'clock location of the LEFT breast.   12/02/2021 Initial Diagnosis   Breast cancer of upper-outer quadrant of left female breast ([place])   12/02/2021 Cancer Staging   Staging form: Breast, AJCC 8th Edition - Clinical stage from 12/02/2021: cT1c, cN0, cM0 - Signed by Heath Lark, MD on 12/02/2021 Stage prefix: Initial diagnosis      PHYSICAL EXAMINATION: ECOG PERFORMANCE STATUS: 1 - Symptomatic but completely ambulatory  Vitals:   12/04/21 1030  BP: (!) 163/84  Pulse: 75  Resp: 17  Temp: 97.9 F (36.6 C)  SpO2: 94%   Filed Weights   12/04/21 1030  Weight: 179 lb 3.2 oz (81.3 kg)    GENERAL:alert, no distress and comfortable SKIN: The skin overlying the port appears healing well from recent infection EYES: normal, Conjunctiva are pink and non-injected, sclera clear OROPHARYNX:no exudate, no erythema and lips, buccal mucosa, and tongue normal  NECK: supple, thyroid normal size, non-tender, without nodularity LYMPH:  no palpable lymphadenopathy in the cervical, axillary or inguinal LUNGS: clear to auscultation and percussion with normal breathing effort HEART: regular rate & rhythm and no murmurs and no lower extremity edema ABDOMEN:abdomen soft, non-tender and normal bowel sounds Musculoskeletal:no cyanosis of digits and no clubbing  NEURO: alert & oriented x 3 with fluent speech, no focal motor/sensory deficits  LABORATORY DATA:   I have reviewed the data as listed    Component Value Date/Time   NA 136 12/04/2021 1011   K 4.5 12/04/2021 1011   CL 103 12/04/2021 1011   CO2 21 (L) 12/04/2021 1011   GLUCOSE 162 (H) 12/04/2021 1011   BUN 21 12/04/2021 1011   CREATININE 0.91 12/04/2021 1011   CALCIUM 9.1 12/04/2021 1011   PROT 7.4 12/04/2021 1011   ALBUMIN 4.0 12/04/2021 1011   AST 21 12/04/2021 1011   ALT 15 12/04/2021 1011   ALKPHOS 77 12/04/2021 1011   BILITOT 0.4 12/04/2021 1011   GFRNONAA >60 12/04/2021 1011    No results found for: SPEP, UPEP  Lab Results  Component Value Date   WBC 7.1 12/04/2021   NEUTROABS 5.7 12/04/2021   HGB 12.5 12/04/2021   HCT 37.6 12/04/2021   MCV 90.2 12/04/2021   PLT 246 12/04/2021      Chemistry      Component Value Date/Time   NA 136 12/04/2021 1011   K 4.5 12/04/2021 1011   CL 103 12/04/2021 1011   CO2 21 (L) 12/04/2021 1011   BUN 21 12/04/2021 1011   CREATININE 0.91 12/04/2021 1011      Component Value Date/Time   CALCIUM 9.1 12/04/2021 1011   ALKPHOS 77 12/04/2021 1011   AST 21 12/04/2021 1011   ALT 15 12/04/2021 1011   BILITOT 0.4 12/04/2021 1011       RADIOGRAPHIC STUDIES:  I have personally reviewed the radiological images as listed and agreed with the findings in the report. US BREAST LTD UNI LEFT INC AXILLA  Result Date: 11/19/2021 CLINICAL DATA:  81 year old female presenting for diagnostic  evaluation of a nodule identified on chest CT in the left breast. The patient is currently undergoing chemotherapy for uterine cancer. EXAM: DIGITAL DIAGNOSTIC BILATERAL MAMMOGRAM WITH TOMOSYNTHESIS AND CAD; ULTRASOUND LEFT BREAST LIMITED TECHNIQUE: Bilateral digital diagnostic mammography and breast tomosynthesis was performed. The images were evaluated with computer-aided detection.; Targeted ultrasound examination of the left breast was performed. COMPARISON:  Previous exam(s). ACR Breast Density Category c: The breast tissue is heterogeneously dense,  which may obscure small masses. FINDINGS: Mammogram: Right breast: No suspicious mass, distortion, or microcalcifications are identified to suggest presence of malignancy. Left breast: Spot compression tomosynthesis views of the left breast were performed in addition to standard views. There is an irregular mass with distortion in the upper outer left breast measuring approximately 1.4 cm. There are no additional suspicious findings elsewhere in the left breast. On physical exam I feel a subtle focal thickening in the upper slightly outer left breast. Ultrasound: Targeted ultrasound performed in the left breast at 1 o'clock 6 cm from the nipple demonstrating an irregular hypoechoic mass measuring 1.7 x 1.2 x 1.9 cm. There is internal vascularity. This corresponds to the mammographic finding. Targeted ultrasound the left axilla demonstrates normal lymph nodes. IMPRESSION: 1. Highly suspicious mass in the left breast at 1 o'clock measuring 1.9 cm. 2.  No mammographic evidence of malignancy in the right breast. RECOMMENDATION: Ultrasound-guided core needle biopsy of the left breast mass at 1 o'clock. I have discussed the findings and recommendations with the patient. The patient will be contacted by our scheduler to arrange the biopsy appointment. BI-RADS CATEGORY  5: Highly suggestive of malignancy. Electronically Signed   By: Audie Pinto M.D.   On: 11/19/2021 11:56  MM DIAG BREAST TOMO BILATERAL  Result Date: 11/19/2021 CLINICAL DATA:  81 year old female presenting for diagnostic evaluation of a nodule identified on chest CT in the left breast. The patient is currently undergoing chemotherapy for uterine cancer. EXAM: DIGITAL DIAGNOSTIC BILATERAL MAMMOGRAM WITH TOMOSYNTHESIS AND CAD; ULTRASOUND LEFT BREAST LIMITED TECHNIQUE: Bilateral digital diagnostic mammography and breast tomosynthesis was performed. The images were evaluated with computer-aided detection.; Targeted ultrasound examination of the left  breast was performed. COMPARISON:  Previous exam(s). ACR Breast Density Category c: The breast tissue is heterogeneously dense, which may obscure small masses. FINDINGS: Mammogram: Right breast: No suspicious mass, distortion, or microcalcifications are identified to suggest presence of malignancy. Left breast: Spot compression tomosynthesis views of the left breast were performed in addition to standard views. There is an irregular mass with distortion in the upper outer left breast measuring approximately 1.4 cm. There are no additional suspicious findings elsewhere in the left breast. On physical exam I feel a subtle focal thickening in the upper slightly outer left breast. Ultrasound: Targeted ultrasound performed in the left breast at 1 o'clock 6 cm from the nipple demonstrating an irregular hypoechoic mass measuring 1.7 x 1.2 x 1.9 cm. There is internal vascularity. This corresponds to the mammographic finding. Targeted ultrasound the left axilla demonstrates normal lymph nodes. IMPRESSION: 1. Highly suspicious mass in the left breast at 1 o'clock measuring 1.9 cm. 2.  No mammographic evidence of malignancy in the right breast. RECOMMENDATION: Ultrasound-guided core needle biopsy of the left breast mass at 1 o'clock. I have discussed the findings and recommendations with the patient. The patient will be contacted by our scheduler to arrange the biopsy appointment. BI-RADS CATEGORY  5: Highly suggestive of malignancy. Electronically Signed   By: Audie Pinto M.D.   On: 11/19/2021 11:56  MM  CLIP PLACEMENT LEFT  Result Date: 11/27/2021 CLINICAL DATA:  Status post ultrasound-guided core biopsy of LEFT breast mass. EXAM: 3D DIAGNOSTIC LEFT MAMMOGRAM POST ULTRASOUND BIOPSY COMPARISON:  Previous exam(s). FINDINGS: 3D Mammographic images were obtained following ultrasound guided biopsy of mass in the 1 o'clock location of the LEFT breast and placement of a Venus shaped clip. The biopsy marking clip is in  expected position at the site of biopsy. IMPRESSION: Appropriate positioning of the venous shaped biopsy marking clip at the site of biopsy in the 1 o'clock location of the LEFT breast. Final Assessment: Post Procedure Mammograms for Marker Placement Electronically Signed   By: Nolon Nations M.D.   On: 11/27/2021 14:10  [place] LT BREAST BX W LOC DEV 1ST LESION IMG BX SPEC US GUIDE  Addendum Date: 11/28/2021   ADDENDUM REPORT: 11/28/2021 15:27 ADDENDUM: PATHOLOGY revealed: A. BREAST, LEFT 1:00 6 CM FN; ULTRASOUND-GUIDED BIOPSY: - INVASIVE MAMMARY CARCINOMA, NO SPECIAL TYPE. Size of invasive carcinoma: 7 mm in this sample. Grade 2. Ductal carcinoma in situ: Present, nuclear grade 2. Lymphovascular invasion: Not identified. Pathology results are CONCORDANT with imaging findings, per Dr. Nolon Nations. Pathology results and recommendations were discussed with patient via telephone on 11/28/2021. Patient reported biopsy site doing well with no adverse symptoms, and only slight tenderness at the site. Post biopsy care instructions were reviewed, questions were answered and my direct phone number was provided. Patient was instructed to call [facility] for any additional questions or concerns related to biopsy site. RECOMMENDATION: Surgical and oncological consultation. At patient's request, I contacted [facility] on 11/28/2021 to request patient's provider Joylene John NP) arrange surgical consultation appointment for patient. I spoke with Alfredo Martinez (Admin Asst), who stated that Joylene John NP and Dr. Heath Lark (patient's medical oncologist) have both agreed to arrange surgical consultation with [facility], and contact patient with appointment information. Pathology results reported by Electa Sniff RN on 11/28/2021. Electronically Signed   By: Nolon Nations M.D.   On: 11/28/2021 15:27   Result Date: 11/28/2021 CLINICAL DATA:  Patient presents for  ultrasound-guided core biopsy of LEFT breast mass. EXAM: ULTRASOUND GUIDED LEFT BREAST CORE NEEDLE BIOPSY COMPARISON:  Previous exam(s). PROCEDURE: I met with the patient and we discussed the procedure of ultrasound-guided biopsy, including benefits and alternatives. We discussed the high likelihood of a successful procedure. We discussed the risks of the procedure, including infection, bleeding, tissue injury, clip migration, and inadequate sampling. Informed written consent was given. The usual time-out protocol was performed immediately prior to the procedure. Lesion quadrant: UPPER-OUTER QUADRANT LEFT breast Using sterile technique and 1% Lidocaine as local anesthetic, under direct ultrasound visualization, a 12 gauge spring-loaded device was used to perform biopsy of mass in the 1 o'clock location of the LEFT breast using a LATERAL to MEDIAL approach. At the conclusion of the procedure Venus tissue marker clip was deployed into the biopsy cavity. Follow up 2 view mammogram was performed and dictated separately. IMPRESSION: Ultrasound guided biopsy of LEFT breast mass. No apparent complications. Electronically Signed: By: Nolon Nations M.D. On: 11/27/2021 14:08   IR IMAGING GUIDED PORT INSERTION  Result Date: 11/07/2021 INDICATION: Endometrial malignancy EXAM: IMPLANTED PORT A CATH PLACEMENT WITH ULTRASOUND AND FLUOROSCOPIC GUIDANCE MEDICATIONS: None ANESTHESIA/SEDATION: Moderate (conscious) sedation was employed during this procedure. A total of Versed 2 mg and Fentanyl 100 mcg was administered intravenously. Moderate Sedation Time: 17 minutes. The patient's level of consciousness and vital signs were monitored continuously by radiology  nursing throughout the procedure under my direct supervision. FLUOROSCOPY TIME:  0 minutes, 18 seconds (1 mGy) COMPLICATIONS: None immediate. PROCEDURE: The procedure, risks, benefits, and alternatives were explained to the patient. Questions regarding the procedure were  encouraged and answered. The patient understands and consents to the procedure. A timeout was performed prior to the initiation of the procedure. Patient positioned supine on the angiography table. Right neck and anterior upper chest prepped and draped in the usual sterile fashion. All elements of maximal sterile barrier were utilized including, cap, mask, sterile gown, sterile gloves, large sterile drape, hand scrubbing and 2% Chlorhexidine for skin cleaning. The right internal jugular vein was evaluated with ultrasound and shown to be patent. A permanent ultrasound image was obtained and placed in the patient's medical record. Local anesthesia was provided with 1% lidocaine with epinephrine. Using sterile gel and a sterile probe cover, the right internal jugular vein was entered with a 21 ga needle during real time ultrasound guidance. 0.018 inch guidewire placed and 21 ga needle exchanged for transitional dilator set. Utilizing fluoroscopy, 0.035 inch guidewire advanced through the needle without difficulty. Attention then turned to the right anterior upper chest. Following local lidocaine administration, a port pocket was created. The catheter was connected to the port and brought from the pocket to the venotomy site through a subcutaneous tunnel. The catheter was cut to size and inserted through the peel-away sheath. The catheter tip was positioned at the cavoatrial junction using fluoroscopic guidance. The port aspirated and flushed well. The port pocket was closed with deep and superficial absorbable suture. The port pocket incision and venotomy sites were also sealed with Dermabond. IMPRESSION: Successful placement of a right internal jugular approach power injectable Port-A-Cath. The catheter is ready for immediate use. Electronically Signed   By: Miachel Roux M.D.   On: 11/07/2021 12:32   MM Outside Films Mammo  Result Date: 11/06/2021 This examination belongs to an outside facility and is stored here  for comparison purposes only.  Contact the originating outside institution for any associated report or interpretation.  MM Outside Films Mammo  Result Date: 11/06/2021 This examination belongs to an outside facility and is stored here for comparison purposes only.  Contact the originating outside institution for any associated report or interpretation.  MM Outside Films Mammo  Result Date: 11/06/2021 This examination belongs to an outside facility and is stored here for comparison purposes only.  Contact the originating outside institution for any associated report or interpretation.  MM Outside Films Mammo  Result Date: 11/06/2021 This examination belongs to an outside facility and is stored here for comparison purposes only.  Contact the originating outside institution for any associated report or interpretation.

## 2021-12-04 NOTE — Progress Notes (Signed)
Placed genetics referral per Dr. Alvy Bimler.  Provided patient with a printed calender for the appointment.

## 2021-12-05 ENCOUNTER — Telehealth: Payer: Self-pay | Admitting: Oncology

## 2021-12-05 NOTE — Telephone Encounter (Signed)
Kristin Carlson left a message regarding her upcoming appointments.  Called her back and left appointment information for Dr. Donne Hazel on 12/12/21 and genetics on 01/05/22.

## 2021-12-08 ENCOUNTER — Encounter: Payer: Self-pay | Admitting: *Deleted

## 2021-12-08 ENCOUNTER — Telehealth: Payer: Self-pay | Admitting: *Deleted

## 2021-12-08 NOTE — Telephone Encounter (Signed)
Spoke to Kristin Carlson, provided navigation resources and contact information. Confirmed appt with Dr. Donne Hazel on 12/12/21. Provided directions. Encourage Kristin Carlson to call with needs or questions. Received verbal understanding.

## 2021-12-09 ENCOUNTER — Telehealth: Payer: Self-pay

## 2021-12-09 NOTE — Telephone Encounter (Signed)
Pt lmovm requesting to cancel appt for 1/12 in [place] with Dr Allen Norris... appt has been canceled, pt advised to call us back to reschedule at her convenience

## 2021-12-10 NOTE — Progress Notes (Signed)
Kristin Carlson is here today for HDR [facility] fitting and treatment.  They did not have external beam radiation treatment.   Does the patient complain of any of the following:  Pain:denies Abdominal bloating: denies currently, has had in the past Diarrhea/Constipation: diarrhea and constipation after treatment Nausea/Vomiting: denies Vaginal Discharge: intermittent vaginal bleeding Blood in Urine or Stool: denies Urinary Issues (dysuria/incomplete emptying/ incontinence/ increased frequency/urgency/nocturia): incomplete bladder emptying, urge incontinence, overactive bladder, urgency, nocturia x1-3 Post radiation skin changes: n/a   Additional comments if applicable: none  Vitals:   12/18/21 0806  BP: 123/85  Pulse: 66  Resp: 18  Temp: (!) 96.7 F (35.9 C)  TempSrc: Temporal  SpO2: 100%  Weight: 180 lb (81.6 kg)  Height: 5\' 3"  (1.6 m)

## 2021-12-11 ENCOUNTER — Ambulatory Visit: Payer: Medicare Other | Admitting: Gastroenterology

## 2021-12-15 ENCOUNTER — Other Ambulatory Visit: Payer: Self-pay | Admitting: Hematology and Oncology

## 2021-12-15 ENCOUNTER — Encounter: Payer: Self-pay | Admitting: Hematology and Oncology

## 2021-12-16 ENCOUNTER — Encounter: Payer: Self-pay | Admitting: *Deleted

## 2021-12-17 ENCOUNTER — Telehealth: Payer: Self-pay | Admitting: *Deleted

## 2021-12-17 ENCOUNTER — Telehealth: Payer: Self-pay | Admitting: Oncology

## 2021-12-17 NOTE — Telephone Encounter (Signed)
Kristin Carlson called and she has started having a "scant" amount of bloody vaginal discharge.  She also reports seeing very tiny clots. She has been having discharge for a while and said it was nondescript.  She became concerned today because it is getting more bloody.  She is scheduled to see Dr. Sondra Come tomorrow for her first HDR treatment.

## 2021-12-17 NOTE — Progress Notes (Signed)
°  Radiation Oncology         [phone]) [phone] ________________________________  Name: Kristin Carlson MRN: [id number]  Date: 12/18/2021  DOB: [birth year]  CC: [name], FNP  Lafonda Mosses, MD  HDR BRACHYTHERAPY NOTE  DIAGNOSIS: Stage III (pT3, pN0, cM0) endometrial cancer, high-grade serous   Simple treatment device note: Patient had construction of her custom vaginal cylinder. She will be treated with a 2.5 cm diameter segmented cylinder. This conforms to her anatomy without undue discomfort.  Vaginal brachytherapy procedure node: The patient was brought to the [place] suite. Identity was confirmed. All relevant records and images related to the planned course of therapy were reviewed. The patient freely provided informed written consent to proceed with treatment after reviewing the details related to the planned course of therapy. The consent form was witnessed and verified by the simulation staff. Then, the patient was set-up in a stable reproducible supine position for radiation therapy. Pelvic exam revealed the vaginal cuff to be intact . The patient's custom vaginal cylinder was placed in the proximal vagina. This was affixed to the CT/MR stabilization plate to prevent slippage. Patient tolerated the placement well.  Verification simulation note:  A fiducial marker was placed within the vaginal cylinder. An AP and lateral film was then obtained through the pelvis area. This documented accurate position of the vaginal cylinder for treatment.  HDR BRACHYTHERAPY TREATMENT  The remote afterloading device was affixed to the vaginal cylinder by catheter. Patient then proceeded to undergo her first high-dose-rate treatment directed at the proximal vagina. The patient was prescribed a dose of 6.0 gray to be delivered to the mucosal surface. Treatment length was 3.0 cm. Patient was treated with 1 channel using 7 dwell positions. Treatment time was 233.0 seconds. Iridium 192 was the high-dose-rate  source for treatment. The patient tolerated the treatment well. After completion of her therapy, a radiation survey was performed documenting return of the iridium source into the GammaMed safe.   PLAN: The patient will return next week for her second high-dose-rate treatment.  The patient was noted to have a scant amount of blood on the tip of the vaginal cylinder upon removal.  No significant bleeding whatsoever.  We will see if Dr. Berline Lopes may be able to see her for an exam tomorrow. ________________________________  -----------------------------------  Blair Promise, PhD, MD  This document serves as a record of services personally performed by Gery Pray, MD. It was created on his behalf by Roney Mans, a trained medical scribe. The creation of this record is based on the scribe's personal observations and the provider's statements to them. This document has been checked and approved by the attending provider.

## 2021-12-17 NOTE — Telephone Encounter (Signed)
Pls keep me posted

## 2021-12-17 NOTE — Progress Notes (Signed)
Radiation Oncology         [phone] ________________________________  Name: Kristin Carlson MRN: [id number]  Date: 12/18/2021  DOB: [birth year]  Vaginal Brachytherapy Procedure Note  CC: [name], FNP Lafonda Mosses, MD    ICD-10-CM   1. Endometrial cancer ([place])  C54.1     2. Malignant neoplasm of upper-outer quadrant of left female breast, unspecified estrogen receptor status ([place])  C50.412     3. Basal cell carcinoma (BCC), unspecified site  C44.91       Diagnosis: Stage III (pT3, pN0, cM0) endometrial cancer, high-grade serous  Narrative: She returns today for vaginal cylinder fitting.   Since she was last seen for her initial consultation on 11/03/21, the patient met that same date with Dr. Berline Lopes for a post-op follow up. During which time, the patient reported occasional twinges of pain in her abdomen and pelvis but overall no significant pain. She also reported some left groin numbness and skin sensitivity, as well as issues with bowel function which has been worsening over the years.  She endorsed a good appetite although decreased some from prior, and denied any nausea or emesis.  In addition, about 4 to 5 days after surgery, she began having some intermittent spotting that then became pink-tinged and ultimately stopped. In regards to her left groin numbness and skin sensitivity, Dr. Berline Lopes informed the patient that this is likely related to her recent surgery near her genitofemoral nerve.   The patient began chemotherapy consisting of taxol and carboplatin on 11/11/21 under the care of Dr. Alvy Bimler.  Unfortunately, the patient was sent to the ED on 11/21/21 from OP oncology with concerns of infection around her port. The patient reported onset of discomfort and redness around her port site that prior evening. Accordingly, she presented to oncology where she was assessed and had labs performed. Labs showed WBC of 1.9, with ANC 400. In addition, the patient developed a  allergic reaction to vancomycin in the ED, characterized by redness of her scalp with itching.  This abated shortly after the vancomycin was turned off. Accordingly, the patient was given IV flagyl and cefepime prior to discharge home.  Pertinent imaging since the patient was last seen includes a diagnostic bilateral mammogram and ultrasound on 11/19/21, (for evaluation of a nodule identified on chest CT from October) which showed a highly suspicious mass in the left breast, 1 o'clock position, measuring 1.9 cm. No mammographic evidence of malignancy was appreciated in the right breast.  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 Prognostic indicators significant for: ER status 90% positive with strong staining intensity; PR status 11-50% positive with moderate staining intensity; Her2 status negative; Ki67 not performed.   During her most recent follow up with Dr. 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 Given concern for recurrence of infection, Dr. Alvy Bimler recommended a minor dose adjustment to taxol, and for the patient to receive treatment through a peripheral IV to allow for further recovery from her recent port site infection.   She denies any pain in the pelvis area, dysuria or strong odor to her urine.  As above the patient has noticed some occasional vaginal spotting.  She denies any chills or fever.  ALLERGIES: is allergic to lactose, codeine, penicillins, sulfa antibiotics, and vancomycin.  Meds: Current Outpatient Medications  Medication Sig Dispense Refill   acetaminophen (TYLENOL) 500 MG tablet  Take 1,000 mg by mouth every 6 (six) hours as needed for moderate pain.     Cholecalciferol 50 MCG (2000 UT) CAPS Take 2,000 Units by mouth daily.     dexamethasone (DECADRON) 4 MG tablet Take 2 tabs at the night before and 2 tab the morning of  chemotherapy, every 3 weeks, by mouth x 6 cycles 36 tablet 6   ibuprofen (ADVIL) 200 MG tablet Take 400 mg by mouth every 8 (eight) hours as needed for moderate pain.     levothyroxine (SYNTHROID) 137 MCG tablet Take 137 mcg by mouth daily before breakfast.     lidocaine-prilocaine (EMLA) cream Apply to affected area once 30 g 3   losartan (COZAAR) 50 MG tablet Take 50 mg by mouth daily.     magnesium oxide (MAG-OX) 400 MG tablet Take 400 mg by mouth daily.     Multiple Vitamins-Minerals (MULTIVITAMIN WOMEN 50+ PO) Take 1 tablet by mouth daily.     Omega-3 1000 MG CAPS Take 1,000 mg by mouth daily.     ondansetron (ZOFRAN) 8 MG tablet Take 1 tablet (8 mg total) by mouth every 8 (eight) hours as needed. 30 tablet 1   pantoprazole (PROTONIX) 20 MG tablet Take 20 mg by mouth daily as needed for heartburn or indigestion.     prochlorperazine (COMPAZINE) 10 MG tablet Take 1 tablet (10 mg total) by mouth every 6 (six) hours as needed (Nausea or vomiting). 30 tablet 1   propranolol (INDERAL) 40 MG tablet Take 40 mg by mouth 2 (two) times daily.     rosuvastatin (CRESTOR) 5 MG tablet Take 5 mg by mouth daily.     Simethicone 125 MG TABS Take 250 mg by mouth 2 (two) times daily as needed (gas).     temazepam (RESTORIL) 15 MG capsule Take 15 mg by mouth at bedtime as needed for sleep.     No current facility-administered medications for this encounter.   Facility-Administered Medications Ordered in Other Encounters  Medication Dose Route Frequency Provider Last Rate Last Admin   sodium chloride flush (NS) 0.9 % injection 10 mL  10 mL Intracatheter Once Heath Lark, MD        Physical Findings: The patient is in no acute distress. Patient is alert and oriented.  height is $RemoveB'5\' 3"'HpnwRZDz$  (1.6 m) and weight is 180 lb (81.6 kg). Her temporal temperature is 96.7 F (35.9 C) (abnormal). Her blood pressure is 123/85 and her pulse is 66. Her respiration is 18 and oxygen saturation is 100%.   No palpable cervical,  supraclavicular or axillary lymphoadenopathy. The heart has a regular rate and rhythm. The lungs are clear to auscultation. Abdomen soft and non-tender.  On pelvic examination the external genitalia were unremarkable. A speculum exam was performed. Vaginal cuff intact, no mucosal lesions. On bimanual exam there were no pelvic masses appreciated.  She is noted to have 1 area along the right side of the cuff tends to bleed  slightly.  Application of a Foxx swab of this area stopped this bleeding.  No signs of cuff dehiscence or infection within the upper vaginal area.  Slight erythema to the mucosa in the upper vaginal region.  Lab Findings: Lab Results  Component Value Date   WBC 7.1 12/04/2021   HGB 12.5 12/04/2021   HCT 37.6 12/04/2021   MCV 90.2 12/04/2021   PLT 246 12/04/2021    Radiographic Findings: US BREAST LTD UNI LEFT INC AXILLA  Result Date: 11/19/2021 CLINICAL DATA:  81 year old  female presenting for diagnostic evaluation of a nodule identified on chest CT in the left breast. The patient is currently undergoing chemotherapy for uterine cancer. EXAM: DIGITAL DIAGNOSTIC BILATERAL MAMMOGRAM WITH TOMOSYNTHESIS AND CAD; ULTRASOUND LEFT BREAST LIMITED TECHNIQUE: Bilateral digital diagnostic mammography and breast tomosynthesis was performed. The images were evaluated with computer-aided detection.; Targeted ultrasound examination of the left breast was performed. COMPARISON:  Previous exam(s). ACR Breast Density Category c: The breast tissue is heterogeneously dense, which may obscure small masses. FINDINGS: Mammogram: Right breast: No suspicious mass, distortion, or microcalcifications are identified to suggest presence of malignancy. Left breast: Spot compression tomosynthesis views of the left breast were performed in addition to standard views. There is an irregular mass with distortion in the upper outer left breast measuring approximately 1.4 cm. There are no additional suspicious  findings elsewhere in the left breast. On physical exam I feel a subtle focal thickening in the upper slightly outer left breast. Ultrasound: Targeted ultrasound performed in the left breast at 1 o'clock 6 cm from the nipple demonstrating an irregular hypoechoic mass measuring 1.7 x 1.2 x 1.9 cm. There is internal vascularity. This corresponds to the mammographic finding. Targeted ultrasound the left axilla demonstrates normal lymph nodes. IMPRESSION: 1. Highly suspicious mass in the left breast at 1 o'clock measuring 1.9 cm. 2.  No mammographic evidence of malignancy in the right breast. RECOMMENDATION: Ultrasound-guided core needle biopsy of the left breast mass at 1 o'clock. I have discussed the findings and recommendations with the patient. The patient will be contacted by our scheduler to arrange the biopsy appointment. BI-RADS CATEGORY  5: Highly suggestive of malignancy. Electronically Signed   By: Audie Pinto M.D.   On: 11/19/2021 11:56  MM DIAG BREAST TOMO BILATERAL  Result Date: 11/19/2021 CLINICAL DATA:  81 year old female presenting for diagnostic evaluation of a nodule identified on chest CT in the left breast. The patient is currently undergoing chemotherapy for uterine cancer. EXAM: DIGITAL DIAGNOSTIC BILATERAL MAMMOGRAM WITH TOMOSYNTHESIS AND CAD; ULTRASOUND LEFT BREAST LIMITED TECHNIQUE: Bilateral digital diagnostic mammography and breast tomosynthesis was performed. The images were evaluated with computer-aided detection.; Targeted ultrasound examination of the left breast was performed. COMPARISON:  Previous exam(s). ACR Breast Density Category c: The breast tissue is heterogeneously dense, which may obscure small masses. FINDINGS: Mammogram: Right breast: No suspicious mass, distortion, or microcalcifications are identified to suggest presence of malignancy. Left breast: Spot compression tomosynthesis views of the left breast were performed in addition to standard views. There is an  irregular mass with distortion in the upper outer left breast measuring approximately 1.4 cm. There are no additional suspicious findings elsewhere in the left breast. On physical exam I feel a subtle focal thickening in the upper slightly outer left breast. Ultrasound: Targeted ultrasound performed in the left breast at 1 o'clock 6 cm from the nipple demonstrating an irregular hypoechoic mass measuring 1.7 x 1.2 x 1.9 cm. There is internal vascularity. This corresponds to the mammographic finding. Targeted ultrasound the left axilla demonstrates normal lymph nodes. IMPRESSION: 1. Highly suspicious mass in the left breast at 1 o'clock measuring 1.9 cm. 2.  No mammographic evidence of malignancy in the right breast. RECOMMENDATION: Ultrasound-guided core needle biopsy of the left breast mass at 1 o'clock. I have discussed the findings and recommendations with the patient. The patient will be contacted by our scheduler to arrange the biopsy appointment. BI-RADS CATEGORY  5: Highly suggestive of malignancy. Electronically Signed   By: Audie Pinto M.D.   On:  11/19/2021 11:56  MM CLIP PLACEMENT LEFT  Result Date: 11/27/2021 CLINICAL DATA:  Status post ultrasound-guided core biopsy of LEFT breast mass. EXAM: 3D DIAGNOSTIC LEFT MAMMOGRAM POST ULTRASOUND BIOPSY COMPARISON:  Previous exam(s). FINDINGS: 3D Mammographic images were obtained following ultrasound guided biopsy of mass in the 1 o'clock location of the LEFT breast and placement of a Venus shaped clip. The biopsy marking clip is in expected position at the site of biopsy. IMPRESSION: Appropriate positioning of the venous shaped biopsy marking clip at the site of biopsy in the 1 o'clock location of the LEFT breast. Final Assessment: Post Procedure Mammograms for Marker Placement Electronically Signed   By: Nolon Nations M.D.   On: 11/27/2021 14:10  [place] LT BREAST BX W LOC DEV 1ST LESION IMG BX SPEC US GUIDE  Addendum Date: 11/28/2021   ADDENDUM  REPORT: 11/28/2021 15:27 ADDENDUM: PATHOLOGY revealed: A. BREAST, LEFT 1:00 6 CM FN; ULTRASOUND-GUIDED BIOPSY: - INVASIVE MAMMARY CARCINOMA, NO SPECIAL TYPE. Size of invasive carcinoma: 7 mm in this sample. Grade 2. Ductal carcinoma in situ: Present, nuclear grade 2. Lymphovascular invasion: Not identified. Pathology results are CONCORDANT with imaging findings, per Dr. Nolon Nations. Pathology results and recommendations were discussed with patient via telephone on 11/28/2021. Patient reported biopsy site doing well with no adverse symptoms, and only slight tenderness at the site. Post biopsy care instructions were reviewed, questions were answered and my direct phone number was provided. Patient was instructed to call [facility] for any additional questions or concerns related to biopsy site. RECOMMENDATION: Surgical and oncological consultation. At patient's request, I contacted [facility] on 11/28/2021 to request patient's provider Joylene John NP) arrange surgical consultation appointment for patient. I spoke with Alfredo Martinez (Admin Asst), who stated that Joylene John NP and Dr. Heath Lark (patient's medical oncologist) have both agreed to arrange surgical consultation with [facility], and contact patient with appointment information. Pathology results reported by Electa Sniff RN on 11/28/2021. Electronically Signed   By: Nolon Nations M.D.   On: 11/28/2021 15:27   Result Date: 11/28/2021 CLINICAL DATA:  Patient presents for ultrasound-guided core biopsy of LEFT breast mass. EXAM: ULTRASOUND GUIDED LEFT BREAST CORE NEEDLE BIOPSY COMPARISON:  Previous exam(s). PROCEDURE: I met with the patient and we discussed the procedure of ultrasound-guided biopsy, including benefits and alternatives. We discussed the high likelihood of a successful procedure. We discussed the risks of the procedure, including infection, bleeding, tissue injury, clip migration, and  inadequate sampling. Informed written consent was given. The usual time-out protocol was performed immediately prior to the procedure. Lesion quadrant: UPPER-OUTER QUADRANT LEFT breast Using sterile technique and 1% Lidocaine as local anesthetic, under direct ultrasound visualization, a 12 gauge spring-loaded device was used to perform biopsy of mass in the 1 o'clock location of the LEFT breast using a LATERAL to MEDIAL approach. At the conclusion of the procedure Venus tissue marker clip was deployed into the biopsy cavity. Follow up 2 view mammogram was performed and dictated separately. IMPRESSION: Ultrasound guided biopsy of LEFT breast mass. No apparent complications. Electronically Signed: By: Nolon Nations M.D. On: 11/27/2021 14:08    Impression: Stage III (pT3, pN0, cM0) endometrial cancer, high-grade serous  Patient was fitted for a vaginal cylinder. The patient will be treated with a 2.5 cm diameter cylinder with a treatment length of 3.0 cm. This distended the vaginal vault without undue discomfort. The patient tolerated the procedure well.  No signs of blood along the tip of the  vaginal cylinder after removal.  The patient was successfully fitted for a vaginal cylinder. The patient is appropriate to begin vaginal brachytherapy.   Plan: The patient will proceed with CT simulation and vaginal brachytherapy today.    _______________________________   Blair Promise, PhD, MD  This document serves as a record of services personally performed by Gery Pray, MD. It was created on his behalf by Roney Mans, a trained medical scribe. The creation of this record is based on the scribe's personal observations and the provider's statements to them. This document has been checked and approved by the attending provider.

## 2021-12-17 NOTE — Telephone Encounter (Signed)
CALLED PATIENT TO REMIND OF NEW HDR [facility] FOR 10-08-22, SPOKE WITH PATIENT AND SHE IS AWARE OF THESE APPTS. 

## 2021-12-18 ENCOUNTER — Ambulatory Visit
Admission: RE | Admit: 2021-12-18 | Discharge: 2021-12-18 | Disposition: A | Discharge status: Home / Self Care | Payer: Medicare Other | Source: Ambulatory Visit | Attending: Radiation Oncology | Admitting: Radiation Oncology

## 2021-12-18 ENCOUNTER — Encounter: Payer: Self-pay | Admitting: Radiation Oncology

## 2021-12-18 ENCOUNTER — Other Ambulatory Visit: Payer: Self-pay

## 2021-12-18 ENCOUNTER — Encounter: Payer: Self-pay | Admitting: Hematology and Oncology

## 2021-12-18 VITALS — BP 123/85 | HR 66 | Temp 96.7°F | Resp 18 | Ht 63.0 in | Wt 180.0 lb

## 2021-12-18 DIAGNOSIS — C50412 Malignant neoplasm of upper-outer quadrant of left female breast: Secondary | ICD-10-CM

## 2021-12-18 DIAGNOSIS — C541 Malignant neoplasm of endometrium: Secondary | ICD-10-CM

## 2021-12-18 DIAGNOSIS — C4491 Basal cell carcinoma of skin, unspecified: Secondary | ICD-10-CM

## 2021-12-18 DIAGNOSIS — Z5111 Encounter for antineoplastic chemotherapy: Secondary | ICD-10-CM | POA: Diagnosis not present

## 2021-12-18 NOTE — Progress Notes (Signed)
See MD note for nursing evaluation. °

## 2021-12-19 ENCOUNTER — Telehealth: Payer: Self-pay | Admitting: Oncology

## 2021-12-19 NOTE — Telephone Encounter (Signed)
Called Kristin Carlson to see how she is feeling this morning.  She said the vaginal bleeding has disappeared this morning.  She said it usually comes and goes but does not have any bleeding right now.  Asked if she would like to see Joylene John, NP this afternoon for an exam and she said she would like to wait.  Advised her to call back if the bleeding starts again and we can schedule an appointment.  She verbalized understanding and agreement.

## 2021-12-24 ENCOUNTER — Telehealth: Payer: Self-pay | Admitting: *Deleted

## 2021-12-24 NOTE — Telephone Encounter (Signed)
Called patient to remind of HDR Tx. for 12-25-21 @ 9 am, spoke with patient and she is aware of this tx.

## 2021-12-24 NOTE — Progress Notes (Signed)
°  Radiation Oncology         [phone] ________________________________  Name: Kristin Carlson MRN: [id number]  Date: 12/25/2021  DOB: Aug 28, 1941  CC: [name], FNP  Lafonda Mosses, MD  HDR BRACHYTHERAPY NOTE  DIAGNOSIS: Stage III (pT3, pN0, cM0) endometrial cancer, high-grade serous   Simple treatment device note: Patient had construction of her custom vaginal cylinder. She will be treated with a 2.5 cm diameter segmented cylinder. This conforms to her anatomy without undue discomfort.  Vaginal brachytherapy procedure node: The patient was brought to the [place] suite. Identity was confirmed. All relevant records and images related to the planned course of therapy were reviewed. The patient freely provided informed written consent to proceed with treatment after reviewing the details related to the planned course of therapy. The consent form was witnessed and verified by the simulation staff. Then, the patient was set-up in a stable reproducible supine position for radiation therapy. Pelvic exam revealed the vaginal cuff to be intact . The patient's custom vaginal cylinder was placed in the proximal vagina. This was affixed to the CT/MR stabilization plate to prevent slippage. Patient tolerated the placement well.  Verification simulation note:  A fiducial marker was placed within the vaginal cylinder. An AP and lateral film was then obtained through the pelvis area. This documented accurate position of the vaginal cylinder for treatment.  HDR BRACHYTHERAPY TREATMENT  The remote afterloading device was affixed to the vaginal cylinder by catheter. Patient then proceeded to undergo her second high-dose-rate treatment directed at the proximal vagina. The patient was prescribed a dose of 6.0 gray to be delivered to the mucosal surface. Treatment length was 3.0 cm. Patient was treated with 1 channel using 7 dwell positions. Treatment time was 248.8 seconds. Iridium 192 was the high-dose-rate  source for treatment. The patient tolerated the treatment well. After completion of her therapy, a radiation survey was performed documenting return of the iridium source into the Gamma[employer] safe.   PLAN: The patient will return next week for her third high-dose-rate treatment.   ________________________________  -----------------------------------  Blair Promise, PhD, MD  This document serves as a record of services personally performed by Gery Pray, MD. It was created on his behalf by Roney Mans, a trained medical scribe. The creation of this record is based on the scribe's personal observations and the provider's statements to them. This document has been checked and approved by the attending provider.

## 2021-12-25 ENCOUNTER — Encounter: Payer: Medicare Other | Admitting: Dermatology

## 2021-12-25 ENCOUNTER — Ambulatory Visit
Admission: RE | Admit: 2021-12-25 | Discharge: 2021-12-25 | Disposition: A | Discharge status: Home / Self Care | Payer: Medicare Other | Source: Ambulatory Visit | Attending: Radiation Oncology | Admitting: Radiation Oncology

## 2021-12-25 ENCOUNTER — Other Ambulatory Visit: Payer: Self-pay

## 2021-12-25 ENCOUNTER — Encounter: Payer: Self-pay | Admitting: Hematology and Oncology

## 2021-12-25 ENCOUNTER — Inpatient Hospital Stay: Payer: Medicare Other | Admitting: Hematology and Oncology

## 2021-12-25 ENCOUNTER — Telehealth: Payer: Self-pay

## 2021-12-25 ENCOUNTER — Inpatient Hospital Stay: Payer: Medicare Other

## 2021-12-25 DIAGNOSIS — C541 Malignant neoplasm of endometrium: Secondary | ICD-10-CM

## 2021-12-25 DIAGNOSIS — Z5111 Encounter for antineoplastic chemotherapy: Secondary | ICD-10-CM | POA: Diagnosis not present

## 2021-12-25 DIAGNOSIS — R5383 Other fatigue: Secondary | ICD-10-CM

## 2021-12-25 LAB — CMP (CANCER CENTER ONLY)
ALT: 16 U/L (ref 0–44)
AST: 22 U/L (ref 15–41)
Albumin: 3.8 g/dL (ref 3.5–5.0)
Alkaline Phosphatase: 70 U/L (ref 38–126)
Anion gap: 5 (ref 5–15)
BUN: 16 mg/dL (ref 8–23)
CO2: 27 mmol/L (ref 22–32)
Calcium: 9 mg/dL (ref 8.9–10.3)
Chloride: 104 mmol/L (ref 98–111)
Creatinine: 0.73 mg/dL (ref 0.44–1.00)
GFR, Estimated: >60 mL/min (ref >60)
Glucose, Bld: 104 mg/dL — ABNORMAL HIGH (ref 70–99)
Potassium: 4.2 mmol/L (ref 3.5–5.1)
Sodium: 136 mmol/L (ref 135–145)
Total Bilirubin: 0.4 mg/dL (ref 0.3–1.2)
Total Protein: 6.7 g/dL (ref 6.5–8.1)

## 2021-12-25 LAB — CBC WITH DIFFERENTIAL (CANCER CENTER ONLY)
Abs Immature Granulocytes: 0.02 10*3/uL (ref 0.00–0.07)
Basophils Absolute: 0 10*3/uL (ref 0.0–0.1)
Basophils Relative: 0 %
Eosinophils Absolute: 0.1 10*3/uL (ref 0.0–0.5)
Eosinophils Relative: 1 %
HCT: 33.9 % — ABNORMAL LOW (ref 36.0–46.0)
Hemoglobin: 11.3 g/dL — ABNORMAL LOW (ref 12.0–15.0)
Immature Granulocytes: 0 %
Lymphocytes Relative: 27 %
Lymphs Abs: 1.4 10*3/uL (ref 0.7–4.0)
MCH: 30.3 pg (ref 26.0–34.0)
MCHC: 33.3 g/dL (ref 30.0–36.0)
MCV: 90.9 fL (ref 80.0–100.0)
Monocytes Absolute: 0.5 10*3/uL (ref 0.1–1.0)
Monocytes Relative: 10 %
Neutro Abs: 3.3 10*3/uL (ref 1.7–7.7)
Neutrophils Relative %: 62 %
Platelet Count: 170 10*3/uL (ref 150–400)
RBC: 3.73 MIL/uL — ABNORMAL LOW (ref 3.87–5.11)
RDW: 13.4 % (ref 11.5–15.5)
WBC Count: 5.3 10*3/uL (ref 4.0–10.5)
nRBC: 0 % (ref 0.0–0.2)

## 2021-12-25 MED ORDER — SODIUM CHLORIDE 0.9% FLUSH
10.0000 mL | Freq: Once | INTRAVENOUS | Status: AC
  Administered 2021-12-25: 10 mL

## 2021-12-25 MED ORDER — HEPARIN SOD (PORK) LOCK FLUSH 100 UNIT/ML IV SOLN
500.0000 [IU] | Freq: Once | INTRAVENOUS | Status: AC
  Administered 2021-12-25: 500 [IU]

## 2021-12-25 MED FILL — Dexamethasone Sodium Phosphate Inj 100 MG/10ML: INTRAMUSCULAR | Qty: 1 | Status: AC

## 2021-12-25 MED FILL — Fosaprepitant Dimeglumine For IV Infusion 150 MG (Base Eq): INTRAVENOUS | Qty: 5 | Status: AC

## 2021-12-25 NOTE — Telephone Encounter (Signed)
Pls order and add TSH next visit

## 2021-12-25 NOTE — Progress Notes (Signed)
Met with patient at registration to introduce myself as Financial Resource Specialist and to offer available resources. ° °Discussed one-time $1000 [employer] grant and qualifications to assist with personal expenses while going through treatment. ° °Gave her my card if interested in applying and for any additional financial questions or concerns.  °

## 2021-12-25 NOTE — Telephone Encounter (Signed)
Called and left below message. Lab added.

## 2021-12-25 NOTE — Progress Notes (Signed)
Pt came in with redness to port, no pain, or signs of infection. Pt had previously taken 2 rounds of antibiotics and port seems to be improving. Tammi Sou RN also examined port and said she see's no signs of infection. Dr. Burr Medico is aware.

## 2021-12-25 NOTE — Telephone Encounter (Signed)
Spoke with her in lobby. Appt canceled today with Dr. Alvy Bimler due to her being out of the office. Kristin Carlson is feeling okay and would like to proceed with treatment tomorrow. Reminded of next scheduled appts. She verbalized understanding.  She is asking if you can order thyroid labs with next lab appt?

## 2021-12-26 ENCOUNTER — Other Ambulatory Visit: Payer: Self-pay

## 2021-12-26 ENCOUNTER — Inpatient Hospital Stay: Payer: Medicare Other

## 2021-12-26 VITALS — BP 150/94 | HR 72 | Temp 98.5°F | Resp 18 | Wt 179.8 lb

## 2021-12-26 DIAGNOSIS — Z5111 Encounter for antineoplastic chemotherapy: Secondary | ICD-10-CM | POA: Diagnosis not present

## 2021-12-26 DIAGNOSIS — C541 Malignant neoplasm of endometrium: Secondary | ICD-10-CM

## 2021-12-26 MED ORDER — SODIUM CHLORIDE 0.9 % IV SOLN: Freq: Once | INTRAVENOUS | Status: AC

## 2021-12-26 MED ORDER — SODIUM CHLORIDE 0.9 % IV SOLN
510.0000 mg | Freq: Once | INTRAVENOUS | Status: AC
  Administered 2021-12-26: 510 mg via INTRAVENOUS
  Filled 2021-12-26: qty 51

## 2021-12-26 MED ORDER — FAMOTIDINE 20 MG IN NS 100 ML IVPB
20.0000 mg | Freq: Once | INTRAVENOUS | Status: AC
  Administered 2021-12-26: 20 mg via INTRAVENOUS
  Filled 2021-12-26: qty 100

## 2021-12-26 MED ORDER — DIPHENHYDRAMINE HCL 50 MG/ML IJ SOLN
25.0000 mg | Freq: Once | INTRAMUSCULAR | Status: AC
  Administered 2021-12-26: 25 mg via INTRAVENOUS
  Filled 2021-12-26: qty 1

## 2021-12-26 MED ORDER — SODIUM CHLORIDE 0.9 % IV SOLN
150.0000 mg | Freq: Once | INTRAVENOUS | Status: AC
  Administered 2021-12-26: 150 mg via INTRAVENOUS
  Filled 2021-12-26: qty 150

## 2021-12-26 MED ORDER — SODIUM CHLORIDE 0.9 % IV SOLN
131.2500 mg/m2 | Freq: Once | INTRAVENOUS | Status: AC
  Administered 2021-12-26: 252 mg via INTRAVENOUS
  Filled 2021-12-26: qty 42

## 2021-12-26 MED ORDER — PALONOSETRON HCL INJECTION 0.25 MG/5ML
0.2500 mg | Freq: Once | INTRAVENOUS | Status: AC
  Administered 2021-12-26: 0.25 mg via INTRAVENOUS
  Filled 2021-12-26: qty 5

## 2021-12-26 MED ORDER — SODIUM CHLORIDE 0.9 % IV SOLN
10.0000 mg | Freq: Once | INTRAVENOUS | Status: AC
  Administered 2021-12-26: 10 mg via INTRAVENOUS
  Filled 2021-12-26: qty 10

## 2021-12-26 NOTE — Patient Instructions (Signed)
[facility]  Discharge Instructions: Thank you for choosing [facility] to provide your oncology and hematology care.   If you have a lab appointment with the Cancer Center, please go directly to the Cancer Center and check in at the registration area.   Wear comfortable clothing and clothing appropriate for easy access to any Portacath or PICC line.   We strive to give you quality time with your provider. You may need to reschedule your appointment if you arrive late (15 or more minutes).  Arriving late affects you and other patients whose appointments are after yours.  Also, if you miss three or more appointments without notifying the office, you may be dismissed from the clinic at the provider's discretion.      For prescription refill requests, have your pharmacy contact our office and allow 72 hours for refills to be completed.    Today you received the following chemotherapy and/or immunotherapy agents: Paclitaxel (Taxol) and Carboplatin.   To help prevent nausea and vomiting after your treatment, we encourage you to take your nausea medication as directed.  BELOW ARE SYMPTOMS THAT SHOULD BE REPORTED IMMEDIATELY: *FEVER GREATER THAN 100.4 F (38 C) OR HIGHER *CHILLS OR SWEATING *NAUSEA AND VOMITING THAT IS NOT CONTROLLED WITH YOUR NAUSEA MEDICATION *UNUSUAL SHORTNESS OF BREATH *UNUSUAL BRUISING OR BLEEDING *URINARY PROBLEMS (pain or burning when urinating, or frequent urination) *BOWEL PROBLEMS (unusual diarrhea, constipation, pain near the anus) TENDERNESS IN MOUTH AND THROAT WITH OR WITHOUT PRESENCE OF ULCERS (sore throat, sores in mouth, or a toothache) UNUSUAL RASH, SWELLING OR PAIN  UNUSUAL VAGINAL DISCHARGE OR ITCHING   Items with * indicate a potential emergency and should be followed up as soon as possible or go to the Emergency Department if any problems should occur.  Please show the CHEMOTHERAPY ALERT CARD or IMMUNOTHERAPY  ALERT CARD at check-in to the Emergency Department and triage nurse.  Should you have questions after your visit or need to cancel or reschedule your appointment, please contact [facility]  Dept: [phone]  and follow the prompts.  Office hours are 8:00 a.m. to 4:30 p.m. Monday - Friday. Please note that voicemails left after 4:00 p.m. may not be returned until the following business day.  We are closed weekends and major holidays. You have access to a nurse at all times for urgent questions. Please call the main number to the clinic Dept: [phone] and follow the prompts.   For any non-urgent questions, you may also contact your provider using MyChart. We now offer e-Visits for anyone 18 and older to request care online for non-urgent symptoms. For details visit [email or web address].   Also download the MyChart app! Go to the app store, search "MyChart", open the app, select [facility], and log in with your MyChart username and password.  Due to Covid, a mask is required upon entering the hospital/clinic. If you do not have a mask, one will be given to you upon arrival. For doctor visits, patients may have 1 support person aged 18 or older with them. For treatment visits, patients cannot have anyone with them due to current Covid guidelines and our immunocompromised population.   

## 2021-12-26 NOTE — Progress Notes (Signed)
Assessed pt's RT PAC on 12/25/2021.  Pt denied pain when palpated.  No signs of infection on assessment.  PAC is red but based on prior pictures of site in chart is the same. Pt stated the redness has been there since December 2022.  In comparison of picture of site in December 2022 and 12/25/2021 there area is still red but the redness is still in the same area.  Pt had IV abx and PO abx in December 2022.  Pt presented on 12/25/2021 9wks post abx.  Consulted with Dr. Burr Medico on this RN's assessment of pt's PAC.  Per this RN's assessment, Dr. Burr Medico stated pt's RT [facility] is OK To use.

## 2021-12-27 ENCOUNTER — Ambulatory Visit: Payer: TRICARE For Life (TFL)

## 2021-12-29 ENCOUNTER — Encounter: Admitting: Genetic Counselor

## 2021-12-31 ENCOUNTER — Telehealth: Payer: Self-pay | Admitting: *Deleted

## 2021-12-31 ENCOUNTER — Telehealth: Payer: Self-pay | Admitting: Oncology

## 2021-12-31 NOTE — Telephone Encounter (Signed)
Called Kristin Carlson back and added appointment to see Dr. Alvy Bimler at 11:20.  She verbalized understanding and agreement of appointment.

## 2021-12-31 NOTE — Progress Notes (Signed)
°  Radiation Oncology         [phone] ________________________________  Name: Kristin Carlson MRN: [id number]  Date: 01/01/2022  DOB: 05-09-1941  CC: [name], FNP  Lafonda Mosses, MD  HDR BRACHYTHERAPY NOTE  DIAGNOSIS: Stage III (pT3, pN0, cM0) endometrial cancer, high-grade serous   Simple treatment device note: Patient had construction of her custom vaginal cylinder. She will be treated with a 2.5 cm diameter segmented cylinder. This conforms to her anatomy without undue discomfort.  Vaginal brachytherapy procedure node: The patient was brought to the [place] suite. Identity was confirmed. All relevant records and images related to the planned course of therapy were reviewed. The patient freely provided informed written consent to proceed with treatment after reviewing the details related to the planned course of therapy. The consent form was witnessed and verified by the simulation staff. Then, the patient was set-up in a stable reproducible supine position for radiation therapy. Pelvic exam revealed the vaginal cuff to be intact . The patient's custom vaginal cylinder was placed in the proximal vagina. This was affixed to the CT/MR stabilization plate to prevent slippage. Patient tolerated the placement well.  Verification simulation note:  A fiducial marker was placed within the vaginal cylinder. An AP and lateral film was then obtained through the pelvis area. This documented accurate position of the vaginal cylinder for treatment.  HDR BRACHYTHERAPY TREATMENT  The remote afterloading device was affixed to the vaginal cylinder by catheter. Patient then proceeded to undergo her third high-dose-rate treatment directed at the proximal vagina. The patient was prescribed a dose of 6.0 gray to be delivered to the mucosal surface. Treatment length was 3.0 cm. Patient was treated with 1 channel using 7 dwell positions. Treatment time was 157.2 seconds. Iridium 192 was the high-dose-rate  source for treatment. The patient tolerated the treatment well. After completion of her therapy, a radiation survey was performed documenting return of the iridium source into the [employer] safe.   PLAN: The patient will return next week for her fourth high-dose-rate treatment.   ________________________________  -----------------------------------  Blair Promise, PhD, MD  This document serves as a record of services personally performed by Gery Pray, MD. It was created on his behalf by Roney Mans, a trained medical scribe. The creation of this record is based on the scribe's personal observations and the provider's statements to them. This document has been checked and approved by the attending provider.

## 2021-12-31 NOTE — Telephone Encounter (Signed)
CALLED PATIENT TO REMIND OF HDR TX,. FOR 01-01-22 @ 10 AM, SPOKE WITH PATIENT AND SHE IS AWARE OF THIS [place].

## 2021-12-31 NOTE — Telephone Encounter (Signed)
Paislie called and said her port has been draining from where it was accessed on 12/26/21.  It had a scab on it which was itching so she scratched it a few days ago and the scab fell off.  It then drained serosanguinous fluid - enough to soak 2x2 bandages for a few days.  This morning she noticed that it was draining a small amount of yellowish puss. She is wondering if she can have it looked at tomorrow after her radiation treatment at 10:00.  She said the site actually appears better without redness.  She denies having a fever.

## 2021-12-31 NOTE — Telephone Encounter (Signed)
Yes, add her to be seen by me tomorrow

## 2022-01-01 ENCOUNTER — Inpatient Hospital Stay: Payer: Medicare Other | Attending: Gynecologic Oncology | Admitting: Hematology and Oncology

## 2022-01-01 ENCOUNTER — Encounter: Payer: Self-pay | Admitting: Hematology and Oncology

## 2022-01-01 ENCOUNTER — Other Ambulatory Visit: Payer: Self-pay

## 2022-01-01 ENCOUNTER — Ambulatory Visit
Admission: RE | Admit: 2022-01-01 | Discharge: 2022-01-01 | Disposition: A | Discharge status: Home / Self Care | Payer: Medicare Other | Source: Ambulatory Visit | Attending: Radiation Oncology | Admitting: Radiation Oncology

## 2022-01-01 DIAGNOSIS — Z5111 Encounter for antineoplastic chemotherapy: Secondary | ICD-10-CM | POA: Insufficient documentation

## 2022-01-01 DIAGNOSIS — J988 Other specified respiratory disorders: Secondary | ICD-10-CM | POA: Diagnosis not present

## 2022-01-01 DIAGNOSIS — I10 Essential (primary) hypertension: Secondary | ICD-10-CM | POA: Insufficient documentation

## 2022-01-01 DIAGNOSIS — R0989 Other specified symptoms and signs involving the circulatory and respiratory systems: Secondary | ICD-10-CM | POA: Insufficient documentation

## 2022-01-01 DIAGNOSIS — C541 Malignant neoplasm of endometrium: Secondary | ICD-10-CM | POA: Insufficient documentation

## 2022-01-01 DIAGNOSIS — R238 Other skin changes: Secondary | ICD-10-CM | POA: Diagnosis not present

## 2022-01-01 DIAGNOSIS — C50412 Malignant neoplasm of upper-outer quadrant of left female breast: Secondary | ICD-10-CM | POA: Insufficient documentation

## 2022-01-01 DIAGNOSIS — L539 Erythematous condition, unspecified: Secondary | ICD-10-CM | POA: Insufficient documentation

## 2022-01-01 DIAGNOSIS — D61818 Other pancytopenia: Secondary | ICD-10-CM | POA: Insufficient documentation

## 2022-01-01 MED ORDER — DOXYCYCLINE HYCLATE 100 MG PO TABS: 100.0000 mg | ORAL_TABLET | Freq: Two times a day (BID) | ORAL | 0 refills | Status: DC

## 2022-01-01 NOTE — Assessment & Plan Note (Signed)
There is some persistent skin changes with erythema over her port site I did not detect any fluctuance, infection or abscess Given the location of the skin changes and recent drainage, I recommend another 10-day course of doxycycline We discussed the risk and benefits of treatment We will call her next week to check on her progress

## 2022-01-01 NOTE — Progress Notes (Signed)
[facility] OFFICE PROGRESS NOTE  Patient Care Team: [name], FNP as PCP - General (Nurse Practitioner) Jacqulyn Liner, RN as Oncology Nurse Navigator (Oncology)  ASSESSMENT & PLAN:  Other skin changes There is some persistent skin changes with erythema over her port site I did not detect any fluctuance, infection or abscess Given the location of the skin changes and recent drainage, I recommend another 10-day course of doxycycline We discussed the risk and benefits of treatment We will call her next week to check on her progress  Congestion of upper airway Her examination is benign She is reassured  No orders of the defined types were placed in this encounter.   All questions were answered. The patient knows to call the clinic with any problems, questions or concerns. The total time spent in the appointment was 20 minutes encounter with patients including review of chart and various tests results, discussions about plan of care and coordination of care plan   Heath Lark, MD 01/01/2022 1:43 PM  INTERVAL HISTORY: Please see below for problem oriented charting. she returns for urgent evaluation She has noticed some drainage out of the port site The drainage is serosanguineous She also complains of some nasal congestion and discomfort in her throat She denies fever or chills  REVIEW OF SYSTEMS:   Constitutional: Denies fevers, chills or abnormal weight loss Eyes: Denies blurriness of vision Cardiovascular: Denies palpitation, chest discomfort or lower extremity swelling Gastrointestinal:  Denies nausea, heartburn or change in bowel habits Lymphatics: Denies new lymphadenopathy or easy bruising Neurological:Denies numbness, tingling or new weaknesses Behavioral/Psych: Mood is stable, no new changes  All other systems were reviewed with the patient and are negative.  I have reviewed the past medical history, past surgical history, social history and family  history with the patient and they are unchanged from previous note.  ALLERGIES:  is allergic to lactose, codeine, penicillins, sulfa antibiotics, and vancomycin.  MEDICATIONS:  Current Outpatient Medications  Medication Sig Dispense Refill   doxycycline (VIBRA-TABS) 100 MG tablet Take 1 tablet (100 mg total) by mouth 2 (two) times daily. 20 tablet 0   acetaminophen (TYLENOL) 500 MG tablet Take 1,000 mg by mouth every 6 (six) hours as needed for moderate pain.     Cholecalciferol 50 MCG (2000 UT) CAPS Take 2,000 Units by mouth daily.     dexamethasone (DECADRON) 4 MG tablet Take 2 tabs at the night before and 2 tab the morning of chemotherapy, every 3 weeks, by mouth x 6 cycles 36 tablet 6   ibuprofen (ADVIL) 200 MG tablet Take 400 mg by mouth every 8 (eight) hours as needed for moderate pain.     levothyroxine (SYNTHROID) 137 MCG tablet Take 137 mcg by mouth daily before breakfast.     lidocaine-prilocaine (EMLA) cream Apply to affected area once 30 g 3   losartan (COZAAR) 50 MG tablet Take 50 mg by mouth daily.     magnesium oxide (MAG-OX) 400 MG tablet Take 400 mg by mouth daily.     Multiple Vitamins-Minerals (MULTIVITAMIN WOMEN 50+ PO) Take 1 tablet by mouth daily.     Omega-3 1000 MG CAPS Take 1,000 mg by mouth daily.     ondansetron (ZOFRAN) 8 MG tablet Take 1 tablet (8 mg total) by mouth every 8 (eight) hours as needed. 30 tablet 1   pantoprazole (PROTONIX) 20 MG tablet Take 20 mg by mouth daily as needed for heartburn or indigestion.     prochlorperazine (COMPAZINE)  10 MG tablet Take 1 tablet (10 mg total) by mouth every 6 (six) hours as needed (Nausea or vomiting). 30 tablet 1   propranolol (INDERAL) 40 MG tablet Take 40 mg by mouth 2 (two) times daily.     rosuvastatin (CRESTOR) 5 MG tablet Take 5 mg by mouth daily.     Simethicone 125 MG TABS Take 250 mg by mouth 2 (two) times daily as needed (gas).     temazepam (RESTORIL) 15 MG capsule Take 15 mg by mouth at bedtime as needed for  sleep.     No current facility-administered medications for this visit.   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 Evaluation of the pelvis is significantly limited by dense metallic streak artifact from adjacent hip arthroplasty. Within this limitation, there is expansile, masslike, heterogeneously enhancing appearance of the endometrium, measuring at least 5.2 cm in thickness. Findings are in keeping with reported diagnosis of endometrial malignancy. 2. No evidence of lymphadenopathy or metastatic disease in the chest, abdomen, or pelvis. 3. There is a 0.4 cm fissural nodule the superior segment left lower lobe, almost certainly a benign intrapulmonary lymph node. Attention on follow-up. 4. Coronary artery disease.   10/14/2021 Surgery   TRH/BSO, right SLN biopsy, left pelvic and PA LND, peritoneal nodule biopsy, mini-lap for specimen delivery  Findings: On EUA, 8cm bulbous uterus. ON intra-abdominal entry, normal upper abdominal survey. Normal omentum, small and large bowel. Uterus 8-10cm and bulbous. Normal appearing adnexa. Mapping successful on the right, no mapping on the left. Some mildly prominent lymph nodes bilaterally in the pelvis. Small, <5 mm, nodule in the cul de sac. No obvious intra-abdominal or pelvic evidence of disease.   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 SENTINEL LYMPH NODE, RIGHT EXTERNAL ILIAC, BIOPSY:  - Lymph node, negative for carcinoma (0/1)   B. SENTINEL LYMPH NODE, RIGHT OBTURATOR AND SURROUNDING LYMPH NODES,  BIOPSY:  - Lymph nodes, negative for carcinoma (0/5)   C. UTERUS, CERVIX, BILATERAL FALLOPIAN TUBES AND OVARIES:  - Invasive high-grade serous carcinoma  - Carcinoma focally invades into the serosal surface  - Lymphovascular invasion is present  - Benign unremarkable cervix  - Benign unremarkable bilateral fallopian tubes and ovaries  - See oncology table   D. CUL DE SAC NODULE, POSTERIOR, BIOPSY:  - Endometriosis   E. LYMPH NODE, LEFT PELVIC, BIOPSY:  - Lymph nodes, negative for carcinoma (0/3)   F. LYMPH NODE, LEFT PARAORTIC, BIOPSY:  - Lymph nodes, negative for carcinoma (0/5)   ONCOLOGY TABLE:   UTERUS, CARCINOMA OR CARCINOSARCOMA: Resection   Procedure: Total hysterectomy and bilateral salpingo-oophorectomy  Histologic Type: Serous carcinoma  Histologic Grade: High-grade  Myometrial Invasion:       Depth of Myometrial  Invasion (mm): 32 mm       Myometrial Thickness (mm): 32 mm       Percentage of Myometrial Invasion: 100%  Uterine Serosa Involvement: Present, focal  Cervical stromal Involvement: Not identified  Extent of involvement of other tissue/organs: Not identified  Peritoneal/Ascitic Fluid: Negative for carcinoma  Lymphovascular Invasion: Present  Regional Lymph Nodes:       Pelvic Lymph Nodes Examined:                                   6 Sentinel                                   3 Non-sentinel                                   9 Total       Pelvic Lymph Nodes with Metastasis: 0                           Macrometastasis: (>2.0 mm): 0                           Micrometastasis: (>0.2 mm and < 2.0 mm): 0                           Isolated Tumor Cells (<0.2 mm): 0                            Laterality of Lymph Node with Tumor: Not  applicable                           Extracapsular Extension: Not applicable       Para-aortic Lymph Nodes Examined:                                    0 Sentinel                                    5 Non-sentinel                                    5 Total       Para-aortic Lymph Nodes with Metastasis: 0                           Macrometastasis: (>2.0 mm): 0                           Micrometastasis:  (>0.2 mm and < 2.0 mm): 0                           Isolated Tumor Cells (<0.2 mm): 0  Laterality of Lymph Node with Tumor: Not  applicable                           Extracapsular Extension: Not applicable  Distant Metastasis:       Distant Site(s) Involved: Not applicable  Pathologic Stage Classification (pTNM, AJCC 8th Edition): pT3a, pN0  Ancillary Studies: MMR / MSI testing will be ordered  Representative Tumor Block: C4  Comment(s): Pancytokeratin was performed on the lymph nodes and is negative.   By immunohistochemistry, HER-2 is EQUIVOCAL (2+).  HER-2 by FISH is pending and will be reported in an addendum.   FLOURESCENCE IN-SITU HYBRIDIZATION RESULTS:   GROUP 1:  HER2 **POSITIVE**   On the tissue sample received from this individual HER2 FISH was performed by a technologist and cell imaging and analysis on the [employer].   RATIO of HER2/CEN 17 SIGNALS: 2.50  AVERAGE HER2 COPY NUMBER PER CELL: 4.74   The ratio of HER2/CEN 17 result exceeds the cutoff value of >=2.0 and a copy number of HER2 signals exceeding the cutoff range of >=4.0 signals per cell.  Arch Pathol Lab Med 1:1, 2018.    10/28/2021 Cancer Staging   Staging form: Corpus Uteri - Carcinoma and Carcinosarcoma, AJCC 8th Edition - Pathologic stage from 10/28/2021: Stage III (pT3, pN0, cM0) - Signed by Heath Lark, MD on 10/28/2021 Stage prefix: Initial diagnosis    11/07/2021 Procedure   Successful placement of a right internal  jugular approach power injectable Port-A-Cath. The catheter is ready for immediate use.   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 Highly suspicious mass in the left breast at 1 o'clock measuring 1.9 cm.   2.  No mammographic evidence of malignancy in the right breast   11/27/2021 Procedure   Appropriate positioning of the venous shaped biopsy marking clip at the site of biopsy in the 1 o'clock location of the LEFT breast.   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  No fluctuant or pus draining.  The skin does not look cellulitic Pulmonary exam is normal NEURO: alert & oriented x 3 with fluent speech, no focal motor/sensory deficits  LABORATORY DATA:  I have reviewed the data as listed    Component Value Date/Time   NA 136 12/25/2021 1051   K 4.2 12/25/2021 1051   CL 104 12/25/2021 1051   CO2 27 12/25/2021 1051   GLUCOSE 104 (H) 12/25/2021 1051   BUN 16 12/25/2021 1051   CREATININE 0.73 12/25/2021 1051   CALCIUM 9.0 12/25/2021 1051   PROT 6.7 12/25/2021 1051   ALBUMIN 3.8 12/25/2021 1051   AST 22 12/25/2021  1051   ALT 16 12/25/2021 1051   ALKPHOS 70 12/25/2021 1051   BILITOT 0.4 12/25/2021 1051   GFRNONAA  >60 12/25/2021 1051    No results found for: SPEP, UPEP  Lab Results  Component Value Date   WBC 5.3 12/25/2021   NEUTROABS 3.3 12/25/2021   HGB 11.3 (L) 12/25/2021   HCT 33.9 (L) 12/25/2021   MCV 90.9 12/25/2021   PLT 170 12/25/2021      Chemistry      Component Value Date/Time   NA 136 12/25/2021 1051   K 4.2 12/25/2021 1051   CL 104 12/25/2021 1051   CO2 27 12/25/2021 1051   BUN 16 12/25/2021 1051   CREATININE 0.73 12/25/2021 1051      Component Value Date/Time   CALCIUM 9.0 12/25/2021 1051   ALKPHOS 70 12/25/2021 1051   AST 22 12/25/2021 1051   ALT 16 12/25/2021 1051   BILITOT 0.4 12/25/2021 1051

## 2022-01-01 NOTE — Assessment & Plan Note (Signed)
Her examination is benign She is reassured 

## 2022-01-05 ENCOUNTER — Telehealth: Payer: Self-pay | Admitting: Oncology

## 2022-01-05 ENCOUNTER — Inpatient Hospital Stay: Payer: Medicare Other | Admitting: Licensed Clinical Social Worker

## 2022-01-05 ENCOUNTER — Other Ambulatory Visit: Payer: Self-pay | Admitting: Hematology and Oncology

## 2022-01-05 ENCOUNTER — Other Ambulatory Visit: Payer: Self-pay | Admitting: Licensed Clinical Social Worker

## 2022-01-05 ENCOUNTER — Inpatient Hospital Stay: Payer: Medicare Other

## 2022-01-05 ENCOUNTER — Other Ambulatory Visit: Payer: Self-pay

## 2022-01-05 ENCOUNTER — Inpatient Hospital Stay (HOSPITAL_BASED_OUTPATIENT_CLINIC_OR_DEPARTMENT_OTHER): Payer: Medicare Other | Admitting: Hematology and Oncology

## 2022-01-05 ENCOUNTER — Other Ambulatory Visit: Payer: TRICARE For Life (TFL)

## 2022-01-05 ENCOUNTER — Ambulatory Visit: Payer: TRICARE For Life (TFL) | Admitting: Hematology and Oncology

## 2022-01-05 ENCOUNTER — Inpatient Hospital Stay (HOSPITAL_COMMUNITY)
Admission: AD | Admit: 2022-01-05 | Discharge: 2022-01-08 | DRG: 315 | Disposition: A | Discharge status: Home / Self Care | Payer: Medicare Other | Source: Ambulatory Visit | Attending: Hematology and Oncology | Admitting: Hematology and Oncology

## 2022-01-05 ENCOUNTER — Encounter (HOSPITAL_COMMUNITY): Payer: Self-pay | Admitting: Hematology and Oncology

## 2022-01-05 ENCOUNTER — Encounter: Payer: Self-pay | Admitting: Hematology and Oncology

## 2022-01-05 ENCOUNTER — Telehealth: Payer: Self-pay

## 2022-01-05 ENCOUNTER — Encounter: Payer: Self-pay | Admitting: Licensed Clinical Social Worker

## 2022-01-05 VITALS — BP 172/72 | HR 66 | Temp 98.2°F | Resp 18 | Ht 63.0 in | Wt 183.6 lb

## 2022-01-05 DIAGNOSIS — D61818 Other pancytopenia: Secondary | ICD-10-CM | POA: Diagnosis not present

## 2022-01-05 DIAGNOSIS — E739 Lactose intolerance, unspecified: Secondary | ICD-10-CM | POA: Diagnosis present

## 2022-01-05 DIAGNOSIS — K219 Gastro-esophageal reflux disease without esophagitis: Secondary | ICD-10-CM | POA: Diagnosis present

## 2022-01-05 DIAGNOSIS — T80219A Unspecified infection due to central venous catheter, initial encounter: Secondary | ICD-10-CM | POA: Diagnosis present

## 2022-01-05 DIAGNOSIS — Z882 Allergy status to sulfonamides status: Secondary | ICD-10-CM | POA: Diagnosis not present

## 2022-01-05 DIAGNOSIS — C50412 Malignant neoplasm of upper-outer quadrant of left female breast: Secondary | ICD-10-CM

## 2022-01-05 DIAGNOSIS — Z801 Family history of malignant neoplasm of trachea, bronchus and lung: Secondary | ICD-10-CM

## 2022-01-05 DIAGNOSIS — C541 Malignant neoplasm of endometrium: Secondary | ICD-10-CM

## 2022-01-05 DIAGNOSIS — Z96643 Presence of artificial hip joint, bilateral: Secondary | ICD-10-CM | POA: Diagnosis present

## 2022-01-05 DIAGNOSIS — Z85828 Personal history of other malignant neoplasm of skin: Secondary | ICD-10-CM | POA: Diagnosis not present

## 2022-01-05 DIAGNOSIS — Z880 Allergy status to penicillin: Secondary | ICD-10-CM

## 2022-01-05 DIAGNOSIS — Z881 Allergy status to other antibiotic agents status: Secondary | ICD-10-CM | POA: Diagnosis not present

## 2022-01-05 DIAGNOSIS — T8130XA Disruption of wound, unspecified, initial encounter: Secondary | ICD-10-CM | POA: Diagnosis present

## 2022-01-05 DIAGNOSIS — Y848 Other medical procedures as the cause of abnormal reaction of the patient, or of later complication, without mention of misadventure at the time of the procedure: Secondary | ICD-10-CM | POA: Diagnosis present

## 2022-01-05 DIAGNOSIS — D61810 Antineoplastic chemotherapy induced pancytopenia: Secondary | ICD-10-CM | POA: Diagnosis present

## 2022-01-05 DIAGNOSIS — I10 Essential (primary) hypertension: Secondary | ICD-10-CM | POA: Diagnosis present

## 2022-01-05 DIAGNOSIS — Z87891 Personal history of nicotine dependence: Secondary | ICD-10-CM | POA: Diagnosis not present

## 2022-01-05 DIAGNOSIS — Z885 Allergy status to narcotic agent status: Secondary | ICD-10-CM

## 2022-01-05 DIAGNOSIS — E039 Hypothyroidism, unspecified: Secondary | ICD-10-CM | POA: Diagnosis present

## 2022-01-05 DIAGNOSIS — I340 Nonrheumatic mitral (valve) insufficiency: Secondary | ICD-10-CM | POA: Diagnosis not present

## 2022-01-05 DIAGNOSIS — E785 Hyperlipidemia, unspecified: Secondary | ICD-10-CM | POA: Diagnosis present

## 2022-01-05 DIAGNOSIS — T80212A Local infection due to central venous catheter, initial encounter: Secondary | ICD-10-CM | POA: Diagnosis present

## 2022-01-05 DIAGNOSIS — Z808 Family history of malignant neoplasm of other organs or systems: Secondary | ICD-10-CM | POA: Diagnosis not present

## 2022-01-05 DIAGNOSIS — Z981 Arthrodesis status: Secondary | ICD-10-CM

## 2022-01-05 DIAGNOSIS — I351 Nonrheumatic aortic (valve) insufficiency: Secondary | ICD-10-CM | POA: Diagnosis not present

## 2022-01-05 DIAGNOSIS — R5383 Other fatigue: Secondary | ICD-10-CM

## 2022-01-05 DIAGNOSIS — I361 Nonrheumatic tricuspid (valve) insufficiency: Secondary | ICD-10-CM | POA: Diagnosis not present

## 2022-01-05 LAB — CBC WITH DIFFERENTIAL (CANCER CENTER ONLY)
Abs Immature Granulocytes: 0.01 10*3/uL (ref 0.00–0.07)
Basophils Absolute: 0 10*3/uL (ref 0.0–0.1)
Basophils Relative: 0 %
Eosinophils Absolute: 0.1 10*3/uL (ref 0.0–0.5)
Eosinophils Relative: 3 %
HCT: 34 % — ABNORMAL LOW (ref 36.0–46.0)
Hemoglobin: 11.1 g/dL — ABNORMAL LOW (ref 12.0–15.0)
Immature Granulocytes: 0 %
Lymphocytes Relative: 41 %
Lymphs Abs: 0.9 10*3/uL (ref 0.7–4.0)
MCH: 30.1 pg (ref 26.0–34.0)
MCHC: 32.6 g/dL (ref 30.0–36.0)
MCV: 92.1 fL (ref 80.0–100.0)
Monocytes Absolute: 0.3 10*3/uL (ref 0.1–1.0)
Monocytes Relative: 12 %
Neutro Abs: 1 10*3/uL — ABNORMAL LOW (ref 1.7–7.7)
Neutrophils Relative %: 44 %
Platelet Count: 133 10*3/uL — ABNORMAL LOW (ref 150–400)
RBC: 3.69 MIL/uL — ABNORMAL LOW (ref 3.87–5.11)
RDW: 13.4 % (ref 11.5–15.5)
WBC Count: 2.2 10*3/uL — ABNORMAL LOW (ref 4.0–10.5)
nRBC: 0 % (ref 0.0–0.2)

## 2022-01-05 LAB — CMP (CANCER CENTER ONLY)
ALT: 18 U/L (ref 0–44)
AST: 25 U/L (ref 15–41)
Albumin: 3.8 g/dL (ref 3.5–5.0)
Alkaline Phosphatase: 70 U/L (ref 38–126)
Anion gap: 6 (ref 5–15)
BUN: 15 mg/dL (ref 8–23)
CO2: 28 mmol/L (ref 22–32)
Calcium: 9.2 mg/dL (ref 8.9–10.3)
Chloride: 103 mmol/L (ref 98–111)
Creatinine: 0.89 mg/dL (ref 0.44–1.00)
GFR, Estimated: >60 mL/min (ref >60)
Glucose, Bld: 92 mg/dL (ref 70–99)
Potassium: 4.3 mmol/L (ref 3.5–5.1)
Sodium: 137 mmol/L (ref 135–145)
Total Bilirubin: 0.4 mg/dL (ref 0.3–1.2)
Total Protein: 6.8 g/dL (ref 6.5–8.1)

## 2022-01-05 LAB — GENETIC SCREENING ORDER

## 2022-01-05 LAB — TSH: TSH: 5.803 u[IU]/mL — ABNORMAL HIGH (ref 0.308–3.960)

## 2022-01-05 MED ORDER — GUAIFENESIN-DM 100-10 MG/5ML PO SYRP: 10.0000 mL | ORAL_SOLUTION | ORAL | Status: DC | PRN

## 2022-01-05 MED ORDER — DIPHENHYDRAMINE HCL 50 MG/ML IJ SOLN
25.0000 mg | INTRAMUSCULAR | Status: DC
  Administered 2022-01-06 – 2022-01-07 (×2): 25 mg via INTRAVENOUS
  Filled 2022-01-05 (×2): qty 1

## 2022-01-05 MED ORDER — ONDANSETRON 4 MG PO TBDP
4.0000 mg | ORAL_TABLET | Freq: Three times a day (TID) | ORAL | Status: DC | PRN
  Administered 2022-01-07: 4 mg via ORAL
  Filled 2022-01-05: qty 1

## 2022-01-05 MED ORDER — ENOXAPARIN SODIUM 40 MG/0.4ML IJ SOSY
40.0000 mg | PREFILLED_SYRINGE | INTRAMUSCULAR | Status: DC
  Administered 2022-01-05 – 2022-01-07 (×3): 40 mg via SUBCUTANEOUS
  Filled 2022-01-05 (×2): qty 0.4

## 2022-01-05 MED ORDER — ONDANSETRON HCL 4 MG/2ML IJ SOLN
4.0000 mg | Freq: Three times a day (TID) | INTRAMUSCULAR | Status: DC | PRN
  Filled 2022-01-05: qty 2

## 2022-01-05 MED ORDER — DIPHENHYDRAMINE HCL 50 MG/ML IJ SOLN
25.0000 mg | Freq: Once | INTRAMUSCULAR | Status: AC
  Administered 2022-01-05: 25 mg via INTRAVENOUS
  Filled 2022-01-05: qty 1

## 2022-01-05 MED ORDER — HYDROCORTISONE (PERIANAL) 2.5 % EX CREA: 1.0000 "application " | TOPICAL_CREAM | Freq: Two times a day (BID) | CUTANEOUS | Status: DC | PRN

## 2022-01-05 MED ORDER — SODIUM CHLORIDE 0.9% FLUSH: 3.0000 mL | INTRAVENOUS | Status: DC | PRN

## 2022-01-05 MED ORDER — TBO-FILGRASTIM 300 MCG/0.5ML ~~LOC~~ SOSY
300.0000 ug | PREFILLED_SYRINGE | Freq: Every day | SUBCUTANEOUS | Status: DC
  Administered 2022-01-05 – 2022-01-07 (×3): 300 ug via SUBCUTANEOUS
  Filled 2022-01-05 (×4): qty 0.5

## 2022-01-05 MED ORDER — VANCOMYCIN HCL 1750 MG/350ML IV SOLN
1750.0000 mg | Freq: Once | INTRAVENOUS | Status: AC
  Administered 2022-01-05: 1750 mg via INTRAVENOUS
  Filled 2022-01-05: qty 350

## 2022-01-05 MED ORDER — SODIUM CHLORIDE 0.9 % IV SOLN
1.0000 g | Freq: Three times a day (TID) | INTRAVENOUS | Status: DC
  Administered 2022-01-06 (×2): 1 g via INTRAVENOUS
  Filled 2022-01-05 (×3): qty 1

## 2022-01-05 MED ORDER — SODIUM CHLORIDE 0.9 % IV SOLN: 250.0000 mL | INTRAVENOUS | Status: DC | PRN

## 2022-01-05 MED ORDER — ALUM & MAG HYDROXIDE-SIMETH 200-200-20 MG/5ML PO SUSP: 60.0000 mL | ORAL | Status: DC | PRN

## 2022-01-05 MED ORDER — SODIUM CHLORIDE 0.9% FLUSH
3.0000 mL | Freq: Two times a day (BID) | INTRAVENOUS | Status: DC
  Administered 2022-01-07: 3 mL via INTRAVENOUS

## 2022-01-05 MED ORDER — ONDANSETRON HCL 4 MG PO TABS
4.0000 mg | ORAL_TABLET | Freq: Three times a day (TID) | ORAL | Status: DC | PRN
  Administered 2022-01-06: 8 mg via ORAL
  Filled 2022-01-05: qty 2

## 2022-01-05 MED ORDER — SODIUM CHLORIDE 0.9 % IV SOLN
1.0000 g | Freq: Once | INTRAVENOUS | Status: AC
  Administered 2022-01-05: 1 g via INTRAVENOUS
  Filled 2022-01-05: qty 1

## 2022-01-05 MED ORDER — VANCOMYCIN HCL 750 MG/150ML IV SOLN
750.0000 mg | INTRAVENOUS | Status: DC
  Administered 2022-01-06: 750 mg via INTRAVENOUS
  Filled 2022-01-05: qty 150

## 2022-01-05 MED ORDER — ACETAMINOPHEN 325 MG PO TABS
650.0000 mg | ORAL_TABLET | ORAL | Status: DC | PRN
  Administered 2022-01-06 – 2022-01-07 (×3): 650 mg via ORAL
  Filled 2022-01-05 (×3): qty 2

## 2022-01-05 MED ORDER — SENNOSIDES-DOCUSATE SODIUM 8.6-50 MG PO TABS: 1.0000 | ORAL_TABLET | Freq: Every evening | ORAL | Status: DC | PRN

## 2022-01-05 MED ORDER — SODIUM CHLORIDE 0.9 % IV SOLN
8.0000 mg | Freq: Three times a day (TID) | INTRAVENOUS | Status: DC | PRN
  Filled 2022-01-05: qty 4

## 2022-01-05 NOTE — Telephone Encounter (Signed)
Called and given below message. She verbalized understanding. She thinks the port site is worse. She is taking the antibiotics as prescribed. The port site is dark red. She is having yellow colored drainage from the port. Denies temperature. Complaining of some soreness/ pain to port site.  She has a 10 am appt today with genetics.

## 2022-01-05 NOTE — Assessment & Plan Note (Signed)
She has severe pancytopenia due to recent treatment I will initiate broad-spectrum IV antibiotics and [facility] support when she gets a bed

## 2022-01-05 NOTE — Assessment & Plan Note (Signed)
She has been on multiple courses of antibiotics Unfortunately, she has persistent drainage and worsening signs of infection despite recent course of doxycycline As above, I recommend admission to the hospital for port extraction and IV antibiotics and she is in agreement

## 2022-01-05 NOTE — Progress Notes (Signed)
[place] OFFICE PROGRESS NOTE  Patient Care Team: [name], FNP as PCP - General (Nurse Practitioner) Jacqulyn Liner, RN as Oncology Nurse Navigator (Oncology)  ASSESSMENT & PLAN:  Endometrial cancer [facility]) Unfortunately, she has developed persistent drainage and worsening infection around her port Site I will get her directly admitted for port extraction and IV antibiotics I will draw blood work and blood culture today  Pancytopenia, acquired [facility]) She has severe pancytopenia due to recent treatment I will initiate broad-spectrum IV antibiotics and GCF support when she gets a bed  Infected venous access port, initial encounter She has been on multiple courses of antibiotics Unfortunately, she has persistent drainage and worsening signs of infection despite recent course of doxycycline As above, I recommend admission to the hospital for port extraction and IV antibiotics and she is in agreement  Orders Placed This Encounter  Procedures   Culture, blood (single) w Reflex to ID Panel    Standing Status:   Future    Number of Occurrences:   1    Standing Expiration Date:   01/05/2023    All questions were answered. The patient knows to call the clinic with any problems, questions or concerns.  01/05/2022 12:19 PM  INTERVAL HISTORY: Please see below for problem oriented charting. she returns for urgent evaluation She was prescribed a course of doxycycline last week Unfortunately, she has noticed worsening drainage at the port site She denies fever or chills  REVIEW OF SYSTEMS:   Constitutional: Denies fevers, chills or abnormal weight loss Eyes: Denies blurriness of vision Ears, nose, mouth, throat, and face: Denies mucositis or sore throat Respiratory: Denies cough, dyspnea or wheezes Cardiovascular: Denies palpitation, chest discomfort or lower extremity swelling Gastrointestinal:  Denies nausea, heartburn or change in bowel habits Skin: Denies  abnormal skin rashes Lymphatics: Denies new lymphadenopathy or easy bruising Neurological:Denies numbness, tingling or new weaknesses Behavioral/Psych: Mood is stable, no new changes  All other systems were reviewed with the patient and are negative.  I have reviewed the past medical history, past surgical history, social history and family history with the patient and they are unchanged from previous note.  ALLERGIES:  is allergic to lactose, codeine, penicillins, sulfa antibiotics, and vancomycin.  MEDICATIONS:  Current Outpatient Medications  Medication Sig Dispense Refill   acetaminophen (TYLENOL) 500 MG tablet Take 1,000 mg by mouth every 6 (six) hours as needed for moderate pain.     Cholecalciferol 50 MCG (2000 UT) CAPS Take 2,000 Units by mouth daily.     dexamethasone (DECADRON) 4 MG tablet Take 2 tabs at the night before and 2 tab the morning of chemotherapy, every 3 weeks, by mouth x 6 cycles 36 tablet 6   doxycycline (VIBRA-TABS) 100 MG tablet Take 1 tablet (100 mg total) by mouth 2 (two) times daily. 20 tablet 0   ibuprofen (ADVIL) 200 MG tablet Take 400 mg by mouth every 8 (eight) hours as needed for moderate pain.     levothyroxine (SYNTHROID) 137 MCG tablet Take 137 mcg by mouth daily before breakfast.     lidocaine-prilocaine (EMLA) cream Apply to affected area once 30 g 3   losartan (COZAAR) 50 MG tablet Take 50 mg by mouth daily.     magnesium oxide (MAG-OX) 400 MG tablet Take 400 mg by mouth daily.     Multiple Vitamins-Minerals (MULTIVITAMIN WOMEN 50+ PO) Take 1 tablet by mouth daily.     Omega-3 1000 MG CAPS Take 1,000 mg by mouth  daily.     ondansetron (ZOFRAN) 8 MG tablet Take 1 tablet (8 mg total) by mouth every 8 (eight) hours as needed. 30 tablet 1   pantoprazole (PROTONIX) 20 MG tablet Take 20 mg by mouth daily as needed for heartburn or indigestion.     prochlorperazine (COMPAZINE) 10 MG tablet Take 1 tablet (10 mg total) by mouth every 6 (six) hours as needed  (Nausea or vomiting). 30 tablet 1   propranolol (INDERAL) 40 MG tablet Take 40 mg by mouth 2 (two) times daily.     rosuvastatin (CRESTOR) 5 MG tablet Take 5 mg by mouth daily.     Simethicone 125 MG TABS Take 250 mg by mouth 2 (two) times daily as needed (gas).     temazepam (RESTORIL) 15 MG capsule Take 15 mg by mouth at bedtime as needed for sleep.     No current facility-administered medications for this visit.   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 Evaluation of the pelvis is significantly limited by dense metallic streak artifact from adjacent hip arthroplasty. Within this limitation, there is expansile, masslike, heterogeneously enhancing appearance of the endometrium, measuring at least 5.2 cm in thickness. Findings are in keeping with reported diagnosis of endometrial malignancy. 2. No evidence of lymphadenopathy or metastatic disease in the chest, abdomen, or pelvis. 3. There is a 0.4 cm fissural nodule the superior segment left lower lobe, almost certainly a benign intrapulmonary lymph node. Attention on follow-up. 4. Coronary artery disease.   10/14/2021 Surgery   TRH/BSO, right SLN biopsy, left pelvic and PA LND, peritoneal nodule biopsy, mini-lap for specimen delivery  Findings: On EUA, 8cm bulbous uterus. ON intra-abdominal entry, normal upper abdominal survey. Normal omentum, small and large bowel. Uterus 8-10cm and bulbous. Normal appearing adnexa. Mapping successful on the  right, no mapping on the left. Some mildly prominent lymph nodes bilaterally in the pelvis. Small, <5 mm, nodule in the cul de sac. No obvious intra-abdominal or pelvic evidence of disease.   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 SENTINEL LYMPH NODE, RIGHT EXTERNAL ILIAC, BIOPSY:  - Lymph node, negative for carcinoma (0/1)   B. SENTINEL LYMPH NODE, RIGHT OBTURATOR AND SURROUNDING LYMPH NODES,  BIOPSY:  - Lymph nodes, negative for carcinoma (0/5)   C. UTERUS, CERVIX, BILATERAL FALLOPIAN TUBES AND OVARIES:  - Invasive high-grade serous carcinoma  - Carcinoma focally invades into the serosal surface  - Lymphovascular invasion is present  - Benign unremarkable cervix  - Benign unremarkable bilateral fallopian tubes and ovaries  - See oncology table   D. CUL DE SAC NODULE, POSTERIOR, BIOPSY:  - Endometriosis   E. LYMPH NODE, LEFT PELVIC, BIOPSY:  - Lymph nodes, negative for carcinoma (0/3)   F. LYMPH  NODE, LEFT PARAORTIC, BIOPSY:  - Lymph nodes, negative for carcinoma (0/5)   ONCOLOGY TABLE:   UTERUS, CARCINOMA OR CARCINOSARCOMA: Resection   Procedure: Total hysterectomy and bilateral salpingo-oophorectomy  Histologic Type: Serous carcinoma  Histologic Grade: High-grade  Myometrial Invasion:       Depth of Myometrial Invasion (mm): 32 mm       Myometrial Thickness (mm): 32 mm       Percentage of Myometrial Invasion: 100%  Uterine Serosa Involvement: Present, focal  Cervical stromal Involvement: Not identified  Extent of involvement of other tissue/organs: Not identified  Peritoneal/Ascitic Fluid: Negative for carcinoma  Lymphovascular Invasion: Present  Regional Lymph Nodes:       Pelvic Lymph Nodes Examined:                                    6 Sentinel                                   3 Non-sentinel                                   9 Total       Pelvic Lymph Nodes with Metastasis: 0                           Macrometastasis: (>2.0 mm): 0                           Micrometastasis: (>0.2 mm and < 2.0 mm): 0                           Isolated Tumor Cells (<0.2 mm): 0                           Laterality of Lymph Node with Tumor: Not  applicable                           Extracapsular Extension: Not applicable       Para-aortic Lymph Nodes Examined:                                    0 Sentinel                                    5 Non-sentinel                                    5 Total       Para-aortic Lymph Nodes with Metastasis: 0                           Macrometastasis: (>2.0 mm): 0                           Micrometastasis:  (>0.2 mm and <  2.0 mm): 0                           Isolated Tumor Cells (<0.2 mm): 0                           Laterality of Lymph Node with Tumor: Not  applicable                           Extracapsular Extension: Not applicable  Distant Metastasis:       Distant Site(s) Involved: Not applicable  Pathologic Stage Classification (pTNM, AJCC 8th Edition): pT3a, pN0  Ancillary Studies: MMR / MSI testing will be ordered  Representative Tumor Block: C4  Comment(s): Pancytokeratin was performed on the lymph nodes and is negative.   By immunohistochemistry, HER-2 is EQUIVOCAL (2+).  HER-2 by FISH is pending and will be reported in an addendum.   FLOURESCENCE IN-SITU HYBRIDIZATION RESULTS:   GROUP 1:  HER2 **POSITIVE**   On the tissue sample received from this individual HER2 FISH was performed by a technologist and cell imaging and analysis on the [employer].   RATIO of HER2/CEN 17 SIGNALS: 2.50  AVERAGE HER2 COPY NUMBER PER CELL: 4.74   The ratio of HER2/CEN 17 result exceeds the cutoff value of >=2.0 and a copy number of HER2 signals exceeding the cutoff range of >=4.0 signals per  cell.  Arch Pathol Lab Med 1:1, 2018.    10/28/2021 Cancer Staging   Staging form: Corpus Uteri - Carcinoma and Carcinosarcoma, AJCC 8th Edition - Pathologic stage from 10/28/2021: Stage III (pT3, pN0, cM0) - Signed by Heath Lark, MD on 10/28/2021 Stage prefix: Initial diagnosis    11/07/2021 Procedure   Successful placement of a right internal jugular approach power injectable Port-A-Cath. The catheter is ready for immediate use.   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 Highly suspicious mass in the left breast at 1 o'clock measuring 1.9 cm.   2.  No mammographic evidence of malignancy in the right breast   11/27/2021 Procedure   Appropriate positioning of the venous shaped biopsy marking clip at the site of biopsy in the 1 o'clock location of the LEFT breast.   12/02/2021 Initial Diagnosis   Breast cancer of upper-outer quadrant of left female breast ([place])   12/02/2021 Cancer Staging   Staging form: Breast, AJCC 8th Edition - Clinical stage from 12/02/2021: cT1c, cN0, cM0 - Signed by Heath Lark, MD on 12/02/2021 Stage prefix: Initial diagnosis      PHYSICAL EXAMINATION: ECOG PERFORMANCE STATUS: 0 - Asymptomatic  Vitals:   01/05/22 1037  BP: (!) 172/72  Pulse: 66  Resp: 18  Temp: 98.2 F (36.8 C)  SpO2: 100%   Filed Weights   01/05/22 1037  Weight: 183 lb 9.6 oz (83.3 kg)    GENERAL:alert, no distress and comfortable SKIN: There is pus draining from the port site.  I placed a clean bandage over it EYES: normal, Conjunctiva are pink and non-injected, sclera clear OROPHARYNX:no exudate, no erythema and lips, buccal mucosa, and tongue normal  NECK: supple, thyroid normal size, non-tender, without nodularity LYMPH:  no palpable lymphadenopathy in the cervical, axillary or inguinal LUNGS: clear to  auscultation and percussion with normal breathing effort HEART: regular rate & rhythm and no murmurs and no lower extremity edema ABDOMEN:abdomen soft, non-tender and normal bowel sounds Musculoskeletal:no cyanosis of digits and no clubbing  NEURO: alert & oriented x 3 with fluent speech, no focal motor/sensory deficits  LABORATORY DATA:  I have reviewed the data as listed    Component Value Date/Time   NA 137 01/05/2022 1055   K 4.3 01/05/2022 1055   CL 103 01/05/2022 1055   CO2 28 01/05/2022 1055   GLUCOSE 92 01/05/2022 1055   BUN 15 01/05/2022 1055   CREATININE 0.89 01/05/2022 1055   CALCIUM 9.2 01/05/2022 1055   PROT 6.8 01/05/2022 1055   ALBUMIN 3.8 01/05/2022 1055   AST 25 01/05/2022 1055   ALT 18 01/05/2022 1055   ALKPHOS 70 01/05/2022 1055   BILITOT 0.4 01/05/2022 1055   GFRNONAA >60 01/05/2022 1055    No results found for: SPEP, UPEP  Lab Results  Component Value Date   WBC 2.2 (L) 01/05/2022   NEUTROABS 1.0 (L) 01/05/2022   HGB 11.1 (L) 01/05/2022   HCT 34.0 (L) 01/05/2022   MCV 92.1 01/05/2022   PLT 133 (L) 01/05/2022      Chemistry      Component Value Date/Time   NA 137 01/05/2022 1055   K 4.3 01/05/2022 1055   CL 103 01/05/2022 1055   CO2 28 01/05/2022 1055   BUN 15 01/05/2022 1055   CREATININE 0.89 01/05/2022 1055      Component Value Date/Time   CALCIUM 9.2 01/05/2022 1055   ALKPHOS 70 01/05/2022 1055   AST 25 01/05/2022 1055   ALT 18 01/05/2022 1055   BILITOT 0.4 01/05/2022 1055       RADIOGRAPHIC STUDIES: I have personally reviewed the radiological images as listed and agreed with the findings in the report. No results found.

## 2022-01-05 NOTE — Telephone Encounter (Signed)
-----   Message from Heath Lark, MD sent at 01/05/2022  7:32 AM EST ----- Can you call and ask if her skin over the port is better?

## 2022-01-05 NOTE — Telephone Encounter (Signed)
Called Bed Placement and Rosabelle will be going to room 1612.

## 2022-01-05 NOTE — Progress Notes (Signed)
REFERRING PROVIDER: Heath Lark, MD [place],  [place] [address]  PRIMARY PROVIDER:  [name], FNP  PRIMARY REASON FOR VISIT:  1. Endometrial cancer ([place])   2. Malignant neoplasm of upper-outer quadrant of left female breast, unspecified estrogen receptor status ([place])   3. Family history of lung cancer      HISTORY OF PRESENT ILLNESS:   Kristin Carlson, a 81 y.o. female, was seen for a [facility] cancer genetics consultation at the request of Dr. Alvy Bimler  due to a personal history of cancer.  Kristin Carlson presents to clinic today to discuss the possibility of a hereditary predisposition to cancer, genetic testing, and to further clarify her future cancer risks, as well as potential cancer risks for family members.   In 2022, at the age of 12, Kristin Carlson was diagnosed with endometrial cancer. This has been treated with TRH/BSO which showed MSI stable, MMR proteins intact, and chemotherapy. 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 Evaluation of the pelvis is significantly limited by dense metallic streak artifact from adjacent hip arthroplasty. Within this limitation, there is expansile, masslike, heterogeneously enhancing appearance of the endometrium, measuring at least 5.2 cm in thickness. Findings are in keeping with reported diagnosis of endometrial malignancy. 2. No evidence of lymphadenopathy or metastatic disease in the chest, abdomen, or pelvis. 3. There is a 0.4 cm fissural nodule the superior segment left lower lobe, almost certainly a benign intrapulmonary lymph node. Attention on follow-up. 4. Coronary artery  disease.   10/14/2021 Surgery   TRH/BSO, right SLN biopsy, left pelvic and PA LND, peritoneal nodule biopsy, mini-lap for specimen delivery  Findings: On EUA, 8cm bulbous uterus. ON intra-abdominal entry, normal upper abdominal survey. Normal omentum, small and large bowel. Uterus 8-10cm and bulbous. Normal appearing adnexa. Mapping successful on the right, no mapping on the left. Some mildly prominent lymph nodes bilaterally in the pelvis. Small, <5 mm, nodule in the cul de sac. No obvious intra-abdominal or pelvic evidence of disease.   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 SENTINEL LYMPH NODE, RIGHT EXTERNAL ILIAC, BIOPSY:  - Lymph node, negative for carcinoma (0/1)   B. SENTINEL LYMPH NODE, RIGHT OBTURATOR AND SURROUNDING LYMPH NODES,  BIOPSY:  - Lymph nodes, negative for carcinoma (0/5)   C. UTERUS, CERVIX, BILATERAL FALLOPIAN TUBES AND OVARIES:  - Invasive high-grade serous carcinoma  - Carcinoma focally invades into the serosal surface  - Lymphovascular invasion is present  - Benign unremarkable cervix  - Benign unremarkable bilateral fallopian tubes and ovaries  - See oncology table   D. CUL DE SAC NODULE, POSTERIOR, BIOPSY:  - Endometriosis   E. LYMPH NODE, LEFT PELVIC, BIOPSY:  - Lymph nodes, negative for carcinoma (0/3)   F. LYMPH NODE, LEFT PARAORTIC, BIOPSY:  - Lymph nodes, negative for carcinoma (0/5)   ONCOLOGY TABLE:   UTERUS, CARCINOMA OR CARCINOSARCOMA: Resection   Procedure:  Total hysterectomy and bilateral salpingo-oophorectomy  Histologic Type: Serous carcinoma  Histologic Grade: High-grade  Myometrial Invasion:       Depth of Myometrial Invasion (mm): 32 mm       Myometrial Thickness (mm): 32 mm        Percentage of Myometrial Invasion: 100%  Uterine Serosa Involvement: Present, focal  Cervical stromal Involvement: Not identified  Extent of involvement of other tissue/organs: Not identified  Peritoneal/Ascitic Fluid: Negative for carcinoma  Lymphovascular Invasion: Present  Regional Lymph Nodes:       Pelvic Lymph Nodes Examined:                                   6 Sentinel                                   3 Non-sentinel                                   9 Total       Pelvic Lymph Nodes with Metastasis: 0                           Macrometastasis: (>2.0 mm): 0                           Micrometastasis: (>0.2 mm and < 2.0 mm): 0                           Isolated Tumor Cells (<0.2 mm): 0                           Laterality of Lymph Node with Tumor: Not  applicable                           Extracapsular Extension: Not applicable       Para-aortic Lymph Nodes Examined:                                    0 Sentinel                                    5 Non-sentinel                                    5 Total       Para-aortic Lymph Nodes with Metastasis: 0                           Macrometastasis: (>2.0 mm): 0                           Micrometastasis:  (>0.2 mm and < 2.0 mm): 0  Isolated Tumor Cells (<0.2 mm): 0                           Laterality of Lymph Node with Tumor: Not  applicable                           Extracapsular Extension: Not applicable  Distant Metastasis:       Distant Site(s) Involved: Not applicable  Pathologic Stage Classification (pTNM, AJCC 8th Edition): pT3a, pN0  Ancillary Studies: MMR / MSI testing will be ordered  Representative Tumor Block: C4  Comment(s): Pancytokeratin was performed on the lymph nodes and is negative.   By immunohistochemistry, HER-2 is EQUIVOCAL (2+).  HER-2 by FISH is pending and will be reported in an addendum.   FLOURESCENCE IN-SITU HYBRIDIZATION RESULTS:   GROUP 1:  HER2 **POSITIVE**   On the  tissue sample received from this individual HER2 FISH was performed by a technologist and cell imaging and analysis on the [employer].   RATIO of HER2/CEN 17 SIGNALS: 2.50  AVERAGE HER2 COPY NUMBER PER CELL: 4.74   The ratio of HER2/CEN 17 result exceeds the cutoff value of >=2.0 and a copy number of HER2 signals exceeding the cutoff range of >=4.0 signals per cell.  Arch Pathol Lab Med 1:1, 2018.    10/28/2021 Cancer Staging   Staging form: Corpus Uteri - Carcinoma and Carcinosarcoma, AJCC 8th Edition - Pathologic stage from 10/28/2021: Stage III (pT3, pN0, cM0) - Signed by Heath Lark, MD on 10/28/2021 Stage prefix: Initial diagnosis    11/07/2021 Procedure   Successful placement of a right internal jugular approach power injectable Port-A-Cath. The catheter is ready for immediate use.   11/11/2021 -  Chemotherapy   Patient is on Treatment Plan : UTERINE Carboplatin AUC 6 / Paclitaxel q21d     Breast cancer of upper-outer quadrant of left female breast ([facility])  11/19/2021 Imaging   IMPRESSION: 1. Highly suspicious mass in the left breast at 1 o'clock measuring 1.9 cm.   2.  No mammographic evidence of malignancy in the right breast   11/27/2021 Procedure   Appropriate positioning of the venous shaped biopsy marking clip at the site of biopsy in the 1 o'clock location of the LEFT breast.   12/02/2021 Initial Diagnosis   Breast cancer of upper-outer quadrant of left female breast ([place])   12/02/2021 Cancer Staging   Staging form: Breast, AJCC 8th Edition - Clinical stage from 12/02/2021: cT1c, cN0, cM0 - Signed by Heath Lark, MD on 12/02/2021 Stage prefix: Initial diagnosis       RISK FACTORS:  Menarche was at age 66.  First live birth at age 48.  OCP use for many years. Ovaries intact: no.  Hysterectomy: yes.  Menopausal status: postmenopausal.  Colonoscopy: yes;  she reports 1 polyp on last cscope .   Past Medical History:  Diagnosis Date   Actinic keratosis 06/04/2021    right anteromedial thigh   Arthritis    Basal cell carcinoma 06/04/2021   right suprapubic, EDC 07/29/2021   Basal cell carcinoma 07/29/2021   left nasal ala. atypical basaloid cells   Family history of lung cancer    GERD (gastroesophageal reflux disease)    History of basal cell carcinoma 07/29/2021   left thigh, EDC at time of bx   HLD (hyperlipidemia)    Hypertension    Hypothyroidism    Pneumonia    HX OF YEARS  AGO   PONV (postoperative nausea and vomiting)     Past Surgical History:  Procedure Laterality Date   BREAST BIOPSY Left 11/27/2021   u/s bs, venus clip, path pending   BREAST CYST ASPIRATION     cataract Bilateral    HIP SURGERY     IR IMAGING GUIDED PORT INSERTION  11/07/2021   KNEE ARTHROSCOPY Left February 27, 2002   NECK SURGERY  1997   Fusion C3, C4, C5   ROBOTIC ASSISTED TOTAL HYSTERECTOMY WITH BILATERAL SALPINGO OOPHERECTOMY Bilateral 10/14/2021   Procedure: XI ROBOTIC ASSISTED TOTAL HYSTERECTOMY WITH BILATERAL SALPINGO OOPHORECTOMY, LYMPH NODE DISSECTION,  LAPAROTOMY;  Surgeon: Lafonda Mosses, MD;  Location: [facility];  Service: Gynecology;  Laterality: Bilateral;   SENTINEL NODE BIOPSY N/A 10/14/2021   Procedure: SENTINEL NODE DISSECTION;  Surgeon: Lafonda Mosses, MD;  Location: [facility];  Service: Gynecology;  Laterality: N/A;   THYROIDECTOMY  02/27/06   TOTAL HIP ARTHROPLASTY Bilateral    Left Hip -27-Feb-2014, Right Hip 208    Social History   Socioeconomic History   Marital status: Married    Spouse name: Herbie Baltimore   Number of children: 2   Years of education: Not on file   Highest education level: Not on file  Occupational History   Not on file  Tobacco Use   Smoking status: Former    Types: Cigarettes   Smokeless tobacco: Never  Vaping Use   Vaping Use: Never used  Substance and Sexual Activity   Alcohol use: Yes    Comment: RARE   Drug use: Never   Sexual activity: Not Currently  Other Topics Concern   Not on file  Social History Narrative    Retired Therapist, sports   Social Determinants of Radio broadcast assistant Strain: Not on file  Food Insecurity: Not on file  Transportation Needs: Not on file  Physical Activity: Not on file  Stress: Not on file  Social Connections: Not on file     FAMILY HISTORY:  We obtained a detailed, 4-generation family history.  Significant diagnoses are listed below: Family History  Problem Relation Age of Onset   Cancer Father        Lung Cancer   Cancer Paternal Aunt        mouth   Skin cancer Maternal Grandmother    Skin cancer Paternal Grandmother    Colon cancer Neg Hx    Breast cancer Neg Hx    Ovarian cancer Neg Hx    Endometrial cancer Neg Hx    Pancreatic cancer Neg Hx    Prostate cancer Neg Hx    Kristin Carlson has 1 son, 52. His wife reportedly is BRCA positive. Kristin Carlson has 1 daughter, 32, no history of cancer. Patient has 1 brother, 39, no history of cancer aside from skin cancers.  Kristin Carlson mother died at 85. Patient had 3 maternal uncles. Grandmother died 28-Feb-1975, grandfather died over age 52. No known cancers on this side of the family.  Kristin Carlson father died of lung cancer at 74 and had history of smoking. Patient had 1 paternal aunt, she had mouth cancer, and 2 paternal uncles, no cancers. Paternal grandmother died in her 28s. Grandfather died in his 51s.    Kristin Carlson is unaware of previous family history of genetic testing for hereditary cancer risks. Patient's maternal ancestors are of [place] descent, and paternal ancestors are of English/ [place] descent. There is no reported Ashkenazi Jewish ancestry. There is no known consanguinity.  GENETIC COUNSELING ASSESSMENT: Kristin Carlson is a 81 y.o. female with a personal history of cancer which is somewhat suggestive of a hereditary cancer syndrome and predisposition to cancer. We, therefore, discussed and recommended the following at today's visit.   DISCUSSION: We discussed that approximately 10% of breast cancer is  hereditary. Most cases of hereditary breast cancer are associated with BRCA1/BRCA2 genes. Most cases of hereditary endometrial cancer are associated with Lynch syndrome genes, but testing on her tumor for signs of Lynch was normal, meaning it is unlikely she has Lynch syndrome. There are other genes associated with hereditary cancer as well. Cancers and risks are gene specific.  We discussed that testing is beneficial for several reasons including surgical decision-making for breast cancer, knowing about other cancer risks, identifying potential screening and risk-reduction options that may be appropriate, and to understand if other family members could be at risk for cancer and allow them to undergo genetic testing.   We reviewed the characteristics, features and inheritance patterns of hereditary cancer syndromes. We also discussed genetic testing, including the appropriate family members to test, the process of testing, insurance coverage and turn-around-time for results. We discussed the implications of a negative, positive and/or variant of uncertain significant result. We recommended Kristin Carlson pursue genetic testing for the [facility] Multi-Cancer+RNA gene panel.   The Multi-Cancer Panel + RNA offered by [facility] includes sequencing and/or deletion duplication testing of the following 84 genes: AIP, ALK, APC, ATM, AXIN2,BAP1,  BARD1, BLM, BMPR1A, BRCA1, BRCA2, BRIP1, CASR, CDC73, CDH1, CDK4, CDKN1B, CDKN1C, CDKN2A (p14ARF), CDKN2A (p16INK4a), CEBPA, CHEK2, CTNNA1, DICER1, DIS3L2, EGFR (c.2369C>T, p.Thr790Met variant only), EPCAM (Deletion/duplication testing only), FH, FLCN, GATA2, GPC3, GREM1 (Promoter region deletion/duplication testing only), HOXB13 (c.251G>A, p.Gly84Glu), HRAS, KIT, MAX, MEN1, MET, MITF (c.952G>A, p.Glu318Lys variant only), MLH1, MSH2, MSH3, MSH6, MUTYH, NBN, NF1, NF2, NTHL1, PALB2, PDGFRA, PHOX2B, PMS2, POLD1, POLE, POT1, PRKAR1A, PTCH1, PTEN, RAD50, RAD51C, RAD51D, RB1, RECQL4, RET,  RUNX1, SDHAF2, SDHA (sequence changes only), SDHB, SDHC, SDHD, SMAD4, SMARCA4, SMARCB1, SMARCE1, STK11, SUFU, TERC, TERT, TMEM127, TP53, TSC1, TSC2, VHL, WRN and WT1.  Based on Kristin Carlson's personal and family history of cancer, she meets medical criteria for genetic testing. Despite that she meets criteria, she may still have an out of pocket cost. We discussed that if her out of pocket cost for testing is over $100, the laboratory will call and confirm whether she wants to proceed with testing.  If the out of pocket cost of testing is less than $100 she will be billed by the genetic testing laboratory.   PLAN: After considering the risks, benefits, and limitations, Kristin Carlson provided informed consent to pursue genetic testing and the blood sample was sent to [facility] for analysis of the Multi-Cancer+RNA panel. Results should be available within approximately 2-3 weeks' time, at which point they will be disclosed by telephone to Kristin Carlson, as will any additional recommendations warranted by these results. Kristin Carlson will receive a summary of her genetic counseling visit and a copy of her results once available. This information will also be available in Epic.   Kristin Carlson questions were answered to her satisfaction today. Our contact information was provided should additional questions or concerns arise. Thank you for the referral and allowing [place] to share in the care of your patient.   Faith Rogue, MS, [facility] Genetic Counselor [email or web address] Phone: [phone]  The patient was seen for a total of 25 minutes in face-to-face genetic counseling.  Patient was seen with her husband Herbie Baltimore  and daughter, Eustaquio Maize. Dr. Grayland Ormond was available for discussion regarding this case.   _______________________________________________________________________ For Office Staff:  Number of people involved in session: 3 Was an Intern/ student involved with case: no

## 2022-01-05 NOTE — Progress Notes (Signed)
Pharmacy Antibiotic Note  Kristin Carlson is a 81 y.o. female admitted on 01/05/2022 with PAC infection.  Pharmacy has been consulted for vancomycin & meropenem dosing.  During vancomycin 1500 mg administration on 11/21/2021 " Pt called out c/o inflammation and intense pruritis, stopped Vancomycin and spoke with Provider.  Vancomycin discontinued" per ED RN note  Pt with severe pancytopenia 2nd recent chemo  Plan: Meropenem 1 gm IV q8h Benadryl 25 mg IV x 1 then vancomycin 1750 mg IV over 4 hours (instead of 2 hours) Vancomycin 750 mg IV q24 for est AUC 451.5 using SCr 0.89 & Vd 0.5, Css min 11.6 Will premed vancomycin with benadryl Consider using cefepime/flagyl in place of meropenen F/u renal function, WBC (on granix), temp, culture data. Tolerance to vancomycin Vancomycin levels as needed  Height: 5\' 3"  (160 cm) Weight: 82.7 kg (182 lb 4.8 oz) IBW/kg (Calculated) : 52.4  Temp (24hrs), Avg:98.2 F (36.8 C), Min:98.2 F (36.8 C), Max:98.2 F (36.8 C)  Recent Labs  Lab 01/05/22 1055  WBC 2.2*  CREATININE 0.89    Estimated Creatinine Clearance: 53 mL/min (by C-G formula based on SCr of 0.89 mg/dL).    Allergies  Allergen Reactions   Lactose Other (See Comments)    Gi- Upset   Codeine Nausea And Vomiting   Penicillins Rash   Sulfa Antibiotics Rash   Vancomycin Itching and Rash    Antimicrobials this admission: 2/6 vancomycin>> 2/6 meropenem>> Dose adjustments this admission:  Microbiology results: 2/6 BCx1: sent  Thank you for allowing pharmacy to be a part of this patients care.  Eudelia Bunch, Pharm.D 01/05/2022 4:55 PM

## 2022-01-05 NOTE — Assessment & Plan Note (Signed)
Unfortunately, she has developed persistent drainage and worsening infection around her port Site I will get her directly admitted for port extraction and IV antibiotics I will draw blood work and blood culture today

## 2022-01-05 NOTE — H&P (Signed)
[place] ADMISSION NOTE  Patient Care Team: [name], FNP as PCP - General (Nurse Practitioner) Jacqulyn Liner, RN as Oncology Nurse Navigator (Oncology)   ASSESSMENT & PLAN:  Endometrial cancer [facility]) Unfortunately, she has developed persistent drainage and worsening infection around her port Site She has failed outpatient management I will get her directly admitted for port extraction and IV antibiotics I will draw blood work and blood culture today   Pancytopenia, acquired [facility]) She has severe pancytopenia due to recent treatment I will initiate broad-spectrum IV antibiotics and GCF support when she gets a bed   Infected venous access port, initial encounter She has been on multiple courses of antibiotics Unfortunately, she has persistent drainage and worsening signs of infection despite recent course of doxycycline As above, I recommend admission to the hospital for port extraction and IV antibiotics and she is in agreement I will consult ID for recommendation for course of antibiotics tomorrow  Code Status Full  Goals of care Resolution of infection  Discharge planning She will likely be here for 3 days, at least  All questions were answered. The patient knows to call the clinic with any problems, questions or concerns. The total time spent in the appointment was 55 minutes encounter with patients including review of chart and various tests results, discussions about plan of care and coordination of care plan   Heath Lark, MD 01/05/2022 4:59 PM   CHIEF COMPLAINTS/PURPOSE OF ADMISSION Port infection  HISTORY OF PRESENTING ILLNESS:  Kristin Carlson 81 y.o. 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 Evaluation of the pelvis is significantly limited by dense metallic streak artifact from adjacent hip arthroplasty. Within this limitation, there is expansile, masslike, heterogeneously enhancing appearance of the endometrium, measuring at least 5.2 cm in thickness. Findings are in keeping with reported diagnosis of endometrial malignancy. 2. No evidence of lymphadenopathy or metastatic disease in the chest, abdomen, or pelvis. 3. There is a 0.4 cm fissural nodule the superior segment left lower lobe, almost certainly a benign intrapulmonary lymph node. Attention on follow-up. 4. Coronary artery disease.   10/14/2021 Surgery   TRH/BSO, right SLN biopsy, left pelvic and PA LND, peritoneal nodule biopsy, mini-lap for specimen delivery  Findings: On EUA, 8cm bulbous uterus. ON intra-abdominal entry, normal upper abdominal survey. Normal omentum, small and large bowel. Uterus 8-10cm and bulbous. Normal appearing adnexa. Mapping successful on the right, no mapping on the left. Some mildly prominent lymph nodes bilaterally in the pelvis. Small, <5 mm, nodule in the cul de sac. No obvious intra-abdominal or pelvic evidence of disease.   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 SENTINEL LYMPH NODE, RIGHT EXTERNAL ILIAC, BIOPSY:  - Lymph node, negative for carcinoma (0/1)   B. SENTINEL LYMPH NODE, RIGHT OBTURATOR AND SURROUNDING LYMPH NODES,  BIOPSY:  - Lymph nodes, negative for carcinoma (0/5)   C. UTERUS, CERVIX, BILATERAL FALLOPIAN TUBES AND OVARIES:  - Invasive high-grade serous carcinoma  - Carcinoma focally invades into the serosal surface  - Lymphovascular invasion is present  - Benign unremarkable cervix  - Benign unremarkable bilateral fallopian tubes and ovaries  - See oncology table   D. CUL DE SAC NODULE, POSTERIOR, BIOPSY:  - Endometriosis   E. LYMPH NODE, LEFT PELVIC, BIOPSY:  - Lymph nodes, negative for carcinoma (0/3)   F. LYMPH NODE, LEFT PARAORTIC, BIOPSY:  - Lymph nodes, negative for carcinoma (0/5)   ONCOLOGY TABLE:   UTERUS, CARCINOMA OR CARCINOSARCOMA: Resection   Procedure: Total hysterectomy and bilateral salpingo-oophorectomy  Histologic Type: Serous carcinoma  Histologic Grade: High-grade  Myometrial Invasion:       Depth of Myometrial Invasion (mm): 32 mm       Myometrial Thickness (mm): 32 mm       Percentage of Myometrial Invasion: 100%  Uterine Serosa Involvement: Present, focal  Cervical stromal Involvement: Not identified  Extent of involvement of other tissue/organs: Not identified  Peritoneal/Ascitic Fluid: Negative for carcinoma  Lymphovascular Invasion: Present  Regional Lymph Nodes:       Pelvic Lymph Nodes Examined:                                   6 Sentinel                                   3 Non-sentinel                                   9 Total       Pelvic Lymph Nodes with Metastasis: 0                           Macrometastasis: (>2.0 mm): 0                           Micrometastasis: (>0.2 mm and < 2.0 mm): 0                           Isolated Tumor Cells (<0.2 mm): 0                           Laterality of Lymph Node with Tumor: Not  applicable                           Extracapsular  Extension: Not applicable       Para-aortic Lymph Nodes Examined:                                    0 Sentinel  5 Non-sentinel                                    5 Total       Para-aortic Lymph Nodes with Metastasis: 0                           Macrometastasis: (>2.0 mm): 0                           Micrometastasis:  (>0.2 mm and < 2.0 mm): 0                           Isolated Tumor Cells (<0.2 mm): 0                           Laterality of Lymph Node with Tumor: Not  applicable                           Extracapsular Extension: Not applicable  Distant Metastasis:       Distant Site(s) Involved: Not applicable  Pathologic Stage Classification (pTNM, AJCC 8th Edition): pT3a, pN0  Ancillary Studies: MMR / MSI testing will be ordered  Representative Tumor Block: C4  Comment(s): Pancytokeratin was performed on the lymph nodes and is negative.   By immunohistochemistry, HER-2 is EQUIVOCAL (2+).  HER-2 by FISH is pending and will be reported in an addendum.   FLOURESCENCE IN-SITU HYBRIDIZATION RESULTS:   GROUP 1:  HER2 **POSITIVE**   On the tissue sample received from this individual HER2 FISH was performed by a technologist and cell imaging and analysis on the BioView.   RATIO of HER2/CEN 17 SIGNALS: 2.50  AVERAGE HER2 COPY NUMBER PER CELL: 4.74   The ratio of HER2/CEN 17 result exceeds the cutoff value of >=2.0 and a copy number of HER2 signals exceeding the cutoff range of >=4.0 signals per cell.  Arch Pathol Lab Med 1:1, 2018.    10/28/2021 Cancer Staging   Staging form: Corpus Uteri - Carcinoma and Carcinosarcoma, AJCC 8th Edition - Pathologic stage from 10/28/2021: Stage III (pT3, pN0, cM0) - Signed by Heath Lark, MD on 10/28/2021 Stage prefix: Initial diagnosis    11/07/2021 Procedure   Successful placement of a right internal jugular approach power injectable Port-A-Cath. The catheter is ready for immediate use.   11/11/2021 -   Chemotherapy   Patient is on Treatment Plan : UTERINE Carboplatin AUC 6 / Paclitaxel q21d     Breast cancer of upper-outer quadrant of left female breast ([place])  11/19/2021 Imaging   IMPRESSION: 1. Highly suspicious mass in the left breast at 1 o'clock measuring 1.9 cm.   2.  No mammographic evidence of malignancy in the right breast   11/27/2021 Procedure   Appropriate positioning of the venous shaped biopsy marking clip at the site of biopsy in the 1 o'clock location of the LEFT breast.   12/02/2021 Initial Diagnosis   Breast cancer of upper-outer quadrant of left female breast ([place])   12/02/2021 Cancer Staging   Staging form: Breast, AJCC 8th Edition - Clinical stage from 12/02/2021: cT1c, cN0, cM0 - Signed by Heath Lark, MD on 12/02/2021 Stage prefix: Initial diagnosis      MEDICAL HISTORY:  Past Medical  History:  Diagnosis Date   Actinic keratosis 06/04/2021   right anteromedial thigh   Arthritis    Basal cell carcinoma 06/04/2021   right suprapubic, EDC 07/29/2021   Basal cell carcinoma 07/29/2021   left nasal ala. atypical basaloid cells   Family history of lung cancer    GERD (gastroesophageal reflux disease)    History of basal cell carcinoma 07/29/2021   left thigh, EDC at time of bx   HLD (hyperlipidemia)    Hypertension    Hypothyroidism    Pneumonia    HX OF YEARS AGO   PONV (postoperative nausea and vomiting)     SURGICAL HISTORY: Past Surgical History:  Procedure Laterality Date   BREAST BIOPSY Left 11/27/2021   u/s bs, venus clip, path pending   BREAST CYST ASPIRATION     cataract Bilateral    HIP SURGERY     IR IMAGING GUIDED PORT INSERTION  11/07/2021   KNEE ARTHROSCOPY Left 2003   NECK SURGERY  1997   Fusion C3, C4, C5   ROBOTIC ASSISTED TOTAL HYSTERECTOMY WITH BILATERAL SALPINGO OOPHERECTOMY Bilateral 10/14/2021   Procedure: XI ROBOTIC ASSISTED TOTAL HYSTERECTOMY WITH BILATERAL SALPINGO OOPHORECTOMY, LYMPH NODE DISSECTION,  LAPAROTOMY;  Surgeon:  Lafonda Mosses, MD;  Location: [facility];  Service: Gynecology;  Laterality: Bilateral;   SENTINEL NODE BIOPSY N/A 10/14/2021   Procedure: SENTINEL NODE DISSECTION;  Surgeon: Lafonda Mosses, MD;  Location: [facility];  Service: Gynecology;  Laterality: N/A;   THYROIDECTOMY  2007   TOTAL HIP ARTHROPLASTY Bilateral    Left Hip -2015, Right Hip 208    SOCIAL HISTORY: Social History   Socioeconomic History   Marital status: Married    Spouse name: Herbie Baltimore   Number of children: 2   Years of education: Not on file   Highest education level: Not on file  Occupational History   Not on file  Tobacco Use   Smoking status: Former    Types: Cigarettes   Smokeless tobacco: Never  Vaping Use   Vaping Use: Never used  Substance and Sexual Activity   Alcohol use: Yes    Comment: RARE   Drug use: Never   Sexual activity: Not Currently  Other Topics Concern   Not on file  Social History Narrative   Retired Therapist, sports   Social Determinants of Radio broadcast assistant Strain: Not on Art therapist Insecurity: Not on file  Transportation Needs: Not on file  Physical Activity: Not on file  Stress: Not on file  Social Connections: Not on file  Intimate Partner Violence: Not on file    FAMILY HISTORY: Family History  Problem Relation Age of Onset   Cancer Father        Lung Cancer   Cancer Paternal Aunt        mouth   Skin cancer Maternal Grandmother    Skin cancer Paternal Grandmother    Colon cancer Neg Hx    Breast cancer Neg Hx    Ovarian cancer Neg Hx    Endometrial cancer Neg Hx    Pancreatic cancer Neg Hx    Prostate cancer Neg Hx     ALLERGIES:  is allergic to lactose, codeine, penicillins, sulfa antibiotics, and vancomycin.  MEDICATIONS:  Current Facility-Administered Medications  Medication Dose Route Frequency Provider Last Rate Last Admin   0.9 %  sodium chloride infusion  250 mL Intravenous PRN Alvy Bimler, Shivan Hodes, MD       acetaminophen (TYLENOL) tablet 650 mg  650  mg Oral  Q4H PRN Heath Lark, MD       alum & mag hydroxide-simeth (MAALOX/MYLANTA) 200-200-20 MG/5ML suspension 60 mL  60 mL Oral Q4H PRN Alvy Bimler, Ni, MD       diphenhydrAMINE (BENADRYL) injection 25 mg  25 mg Intravenous Once Leodis Sias T, RPH       Followed by   vancomycin (VANCOREADY) IVPB 1750 mg/350 mL  1,750 mg Intravenous Once Leodis Sias T, RPH       enoxaparin (LOVENOX) injection 40 mg  40 mg Subcutaneous Q24H Gorsuch, Ni, MD       guaiFENesin-dextromethorphan (ROBITUSSIN DM) 100-10 MG/5ML syrup 10 mL  10 mL Oral Q4H PRN Alvy Bimler, Ni, MD       hydrocortisone (ANUSOL-HC) 2.5 % rectal cream 1 application  1 application Rectal BID PRN Alvy Bimler, Ni, MD       meropenem (MERREM) 1 g in sodium chloride 0.9 % 100 mL IVPB  1 g Intravenous Once Leodis Sias T, RPH       [START ON 01/06/2022] meropenem (MERREM) 1 g in sodium chloride 0.9 % 100 mL IVPB  1 g Intravenous Q8H Bell, Michelle T, RPH       ondansetron (ZOFRAN) tablet 4-8 mg  4-8 mg Oral Q8H PRN Alvy Bimler, Ni, MD       Or   ondansetron (ZOFRAN-ODT) disintegrating tablet 4-8 mg  4-8 mg Oral Q8H PRN Alvy Bimler, Ni, MD       Or   ondansetron (ZOFRAN) injection 4 mg  4 mg Intravenous Q8H PRN Alvy Bimler, Ni, MD       Or   ondansetron (ZOFRAN) 8 mg in sodium chloride 0.9 % 50 mL IVPB  8 mg Intravenous Q8H PRN Gorsuch, Ni, MD       senna-docusate (Senokot-S) tablet 1 tablet  1 tablet Oral QHS PRN Alvy Bimler, Ni, MD       sodium chloride flush (NS) 0.9 % injection 3 mL  3 mL Intravenous Q12H Gorsuch, Ni, MD       sodium chloride flush (NS) 0.9 % injection 3 mL  3 mL Intravenous PRN Alvy Bimler, Ni, MD       Tbo-Filgrastim (GRANIX) injection 300 mcg  300 mcg Subcutaneous q1800 Alvy Bimler, Ni, MD       Facility-Administered Medications Ordered in Other Encounters  Medication Dose Route Frequency Provider Last Rate Last Admin   sodium chloride flush (NS) 0.9 % injection 10 mL  10 mL Intracatheter Once Heath Lark, MD        REVIEW OF SYSTEMS:   Constitutional:  Denies fevers, chills or abnormal night sweats Eyes: Denies blurriness of vision, double vision or watery eyes Ears, nose, mouth, throat, and face: Denies mucositis or sore throat Respiratory: Denies cough, dyspnea or wheezes Cardiovascular: Denies palpitation, chest discomfort or lower extremity swelling Gastrointestinal:  Denies nausea, heartburn or change in bowel habits Lymphatics: Denies new lymphadenopathy or easy bruising Neurological:Denies numbness, tingling or new weaknesses Behavioral/Psych: Mood is stable, no new changes  All other systems were reviewed with the patient and are negative.  PHYSICAL EXAMINATION: ECOG PERFORMANCE STATUS: 1 - Symptomatic but completely ambulatory  Vitals:   01/05/22 1656  BP: (!) 151/85  Pulse: 75  Resp: 18  Temp: 98.9 F (37.2 C)  SpO2: 100%   Filed Weights   01/05/22 1637  Weight: 182 lb 4.8 oz (82.7 kg)    GENERAL:alert, no distress and comfortable SKIN: skin color, texture, turgor are normal, no rashes or significant lesions EYES: normal, conjunctiva are pink  and non-injected, sclera clear OROPHARYNX:no exudate, no erythema and lips, buccal mucosa, and tongue normal  NECK: supple, thyroid normal size, non-tender, without nodularity LYMPH:  no palpable lymphadenopathy in the cervical, axillary or inguinal LUNGS: clear to auscultation and percussion with normal breathing effort HEART: regular rate & rhythm and no murmurs and no lower extremity edema ABDOMEN:abdomen soft, non-tender and normal bowel sounds Musculoskeletal:no cyanosis of digits and no clubbing  PSYCH: alert & oriented x 3 with fluent speech NEURO: no focal motor/sensory deficits Port site has purulent drainage, new dressing is placed earlier in the clinic  LABORATORY DATA:  I have reviewed the data as listed Lab Results  Component Value Date   WBC 2.2 (L) 01/05/2022   HGB 11.1 (L) 01/05/2022   HCT 34.0 (L) 01/05/2022   MCV 92.1 01/05/2022   PLT 133 (L)  01/05/2022   Recent Labs    12/04/21 1011 12/25/21 1051 01/05/22 1055  NA 136 136 137  K 4.5 4.2 4.3  CL 103 104 103  CO2 21* 27 28  GLUCOSE 162* 104* 92  BUN _0 CREATININE 0.91 0.73 0.89  CALCIUM 9.1 9.0 9.2  GFRNONAA >60 >60 >60  PROT 7.4 6.7 6.8  ALBUMIN 4.0 3.8 3.8  AST _1 ALT _2 ALKPHOS 77 70 70  BILITOT 0.4 0.4 0.4

## 2022-01-05 NOTE — Telephone Encounter (Signed)
I need to see her, if she failed oral antibiotics she needs to be admitted and port has to be removed Please add her on before or after genetics

## 2022-01-05 NOTE — Telephone Encounter (Signed)
Called and given below message and that she Dr. Alvy Bimler needs to see her today, Given appt at 1045. She verbalized understanding.

## 2022-01-06 ENCOUNTER — Inpatient Hospital Stay (HOSPITAL_COMMUNITY): Payer: Medicare Other

## 2022-01-06 DIAGNOSIS — I351 Nonrheumatic aortic (valve) insufficiency: Secondary | ICD-10-CM | POA: Diagnosis not present

## 2022-01-06 DIAGNOSIS — T80212A Local infection due to central venous catheter, initial encounter: Secondary | ICD-10-CM | POA: Diagnosis not present

## 2022-01-06 DIAGNOSIS — I361 Nonrheumatic tricuspid (valve) insufficiency: Secondary | ICD-10-CM

## 2022-01-06 DIAGNOSIS — D61818 Other pancytopenia: Secondary | ICD-10-CM | POA: Diagnosis not present

## 2022-01-06 DIAGNOSIS — I340 Nonrheumatic mitral (valve) insufficiency: Secondary | ICD-10-CM | POA: Diagnosis not present

## 2022-01-06 HISTORY — PX: IR REMOVAL TUN ACCESS W/ PORT W/O FL MOD SED: IMG2290

## 2022-01-06 LAB — CBC WITH DIFFERENTIAL/PLATELET
Abs Immature Granulocytes: 0.03 10*3/uL (ref 0.00–0.07)
Basophils Absolute: 0 10*3/uL (ref 0.0–0.1)
Basophils Relative: 0 %
Eosinophils Absolute: 0.1 10*3/uL (ref 0.0–0.5)
Eosinophils Relative: 2 %
HCT: 31.9 % — ABNORMAL LOW (ref 36.0–46.0)
Hemoglobin: 10.3 g/dL — ABNORMAL LOW (ref 12.0–15.0)
Immature Granulocytes: 1 %
Lymphocytes Relative: 37 %
Lymphs Abs: 1.1 10*3/uL (ref 0.7–4.0)
MCH: 30.7 pg (ref 26.0–34.0)
MCHC: 32.3 g/dL (ref 30.0–36.0)
MCV: 94.9 fL (ref 80.0–100.0)
Monocytes Absolute: 0.6 10*3/uL (ref 0.1–1.0)
Monocytes Relative: 18 %
Neutro Abs: 1.3 10*3/uL — ABNORMAL LOW (ref 1.7–7.7)
Neutrophils Relative %: 42 %
Platelets: 125 10*3/uL — ABNORMAL LOW (ref 150–400)
RBC: 3.36 MIL/uL — ABNORMAL LOW (ref 3.87–5.11)
RDW: 13.7 % (ref 11.5–15.5)
WBC: 3.1 10*3/uL — ABNORMAL LOW (ref 4.0–10.5)
nRBC: 0 % (ref 0.0–0.2)

## 2022-01-06 LAB — ECHOCARDIOGRAM COMPLETE BUBBLE STUDY
AV Peak grad: 8.8 mmHg
Ao pk vel: 1.49 m/s
Area-P 1/2: 3.56 cm2
P 1/2 time: 488 msec
S' Lateral: 2.15 cm

## 2022-01-06 MED ORDER — LIDOCAINE-EPINEPHRINE 1 %-1:100000 IJ SOLN
INTRAMUSCULAR | Status: AC
  Filled 2022-01-06: qty 1

## 2022-01-06 MED ORDER — LIDOCAINE-EPINEPHRINE 1 %-1:100000 IJ SOLN
INTRAMUSCULAR | Status: DC | PRN
  Administered 2022-01-06: 10 mL

## 2022-01-06 MED ORDER — SODIUM CHLORIDE 0.9 % IV SOLN
2.0000 g | Freq: Two times a day (BID) | INTRAVENOUS | Status: DC
  Administered 2022-01-06 – 2022-01-07 (×2): 2 g via INTRAVENOUS
  Filled 2022-01-06 (×2): qty 2

## 2022-01-06 NOTE — Progress Notes (Signed)
Echocardiogram 2D Echocardiogram has been performed.  Kristin Carlson 01/06/2022, 3:05 PM

## 2022-01-06 NOTE — Procedures (Signed)
Interventional Radiology Procedure Note  Date of Procedure: 01/06/2022  Procedure: Port removal   Findings:  1. Removal of right chest port  2. Port was noted to be exposed with minimal infected material in the pocket  3. Pocket partially approximated with 3-0 vicryl, and packed with hydrogel    Complications: No immediate complications noted.   Estimated Blood Loss: minimal  Follow-up and Recommendations: 1. Please obtain wound care consult  2. Follow up in IR clinic for wound check    Albin Felling, MD  Vascular & Interventional Radiology  01/06/2022 4:34 PM

## 2022-01-06 NOTE — Progress Notes (Signed)
Oncology Discharge Planning Admission Note  [facility] at [facility] Address: [address], [place], [place] [address] Hours of Operation:  8am - 5pm, Monday - Friday  Clinic Contact Information:  [phone]  Oncology Care Team: Medical Oncologist:  Dr. Heath Lark  Dr. Alvy Bimler is aware of this hospital admission dated 01/05/22 and has assessed patient at bedside. The cancer center will follow Kristin Carlson inpatient care to assist with discharge planning as indicated by the oncologist.  We will arrange for post hospital follow up prior to discharge.  Disclaimer:  This [facility] note does not imply a formal consult request has been made by the admitting attending for this admission or there will be an inpatient consult completed by oncology.  Please request oncology consults as per standard process as indicated.

## 2022-01-06 NOTE — Progress Notes (Signed)
Kristin Carlson   DOB:May 10, 1941   BU#:[id number]    ASSESSMENT & PLAN:  Endometrial cancer [facility] [place]) Unfortunately, she has developed persistent drainage and worsening infection around her port Site She has failed outpatient management We will get her port removed Continue supportive care   Pancytopenia, acquired [facility]) She has severe pancytopenia due to recent treatment Her leukocyte count is slightly improved with G-CSF Continue another day   Infected venous access port, initial encounter She has been on multiple courses of antibiotics Unfortunately, she has persistent drainage and worsening signs of infection despite recent course of doxycycline As above, I recommend admission to the hospital for port extraction and IV antibiotics and she is in agreement I will consult ID for recommendation for course of antibiotics  Blood culture was drawn on 01/05/2022, results pending   Code Status Full   Goals of care Resolution of infection   Discharge planning She will likely be here for another 2 days    All questions were answered. The patient knows to call the clinic with any problems, questions or concerns.   The total time spent in the appointment was 30 minutes encounter with patients including review of chart and various tests results, discussions about plan of care and coordination of care plan  Heath Lark, MD 01/06/2022 7:38 AM  Subjective:  She had minor headaches and nausea but no fevers Noticed persistent drainage at her port site  Objective:  Vitals:   01/06/22 0102 01/06/22 0440  BP: 140/73 138/70  Pulse: 68 65  Resp: 18 18  Temp: 98 F (36.7 C) 97.8 F (36.6 C)  SpO2: 97% 100%     Intake/Output Summary (Last 24 hours) at 01/06/2022 0738 Last data filed at 01/06/2022 0600 Gross per 24 hour  Intake 549.87 ml  Output 3 ml  Net 546.87 ml    GENERAL:alert, no distress and comfortable SKIN: Noted purulent drainage over her gauze EYES: normal, Conjunctiva are pink and  non-injected, sclera clear OROPHARYNX:no exudate, no erythema and lips, buccal mucosa, and tongue normal  NECK: supple, thyroid normal size, non-tender, without nodularity LYMPH:  no palpable lymphadenopathy in the cervical, axillary or inguinal LUNGS: clear to auscultation and percussion with normal breathing effort HEART: regular rate & rhythm and no murmurs and no lower extremity edema ABDOMEN:abdomen soft, non-tender and normal bowel sounds Musculoskeletal:no cyanosis of digits and no clubbing  NEURO: alert & oriented x 3 with fluent speech, no focal motor/sensory deficits   Labs:  Recent Labs    12/04/21 1011 12/25/21 1051 01/05/22 1055  NA 136 136 137  K 4.5 4.2 4.3  CL 103 104 103  CO2 21* 27 28  GLUCOSE 162* 104* 92  BUN 21 16 15   CREATININE 0.91 0.73 0.89  CALCIUM 9.1 9.0 9.2  GFRNONAA >60 >60 >60  PROT 7.4 6.7 6.8  ALBUMIN 4.0 3.8 3.8  AST 21 22 25   ALT 15 16 18   ALKPHOS 77 70 70  BILITOT 0.4 0.4 0.4

## 2022-01-06 NOTE — Consult Note (Addendum)
[facility]    Date of Admission:  01/05/2022   Total days of inpatient antibiotics 2        Reason for Consult: Port site infection    Principal Problem:   Port or reservoir infection   Assessment: 81 YF with endometrial cancer admitted for port site infection(placed on 11/07/21) form heme-onc clinic.  #Central line infection  - 12/23 she received doxy+ keflex(x7d) for port site infection->followed by doxy x 10d. She had interim improvement of prot site erythema.  On 2/2 started on doxy x 10d for continued redness/drainage at port site -She continued to have drainage from her port on doxy as such admitted for further evaluation and IV antibiotics. Pt reports port has been accessed once in December for infusion. Then accessed again for labs.  -She denies being febrile Recommendations:  -Remove port -D/C meropenem -Start cefepime -Continue vancomycin -TTE to look for vegetation -Repeat blood Cx x2 port/peripheral -Follow blood Cx from 2/6 Microbiology:   Antibiotics: Vancomycin and meropenem 2/6-p  Cultures: Blood 2/6 1/1 NGTD    HPI: Kristin Carlson is a 81 y.o. female with HX of basal cell Cx. GERD, HTN, HLD, on chemotherapy for endometrial cancer admitted for port site infection. She has been on about 2 courses of antibiotics for prot site infection. Started on doxycyline on 2/2 for prot site redness drainage which did not resolve and pt admitted for IV antibiotics and further management. She reports she has been injured once through pot in December. A few days later she developed redness at port site. She was started on doxy+kelfex on 12/23. She reports interim resolution of port site infection in January. Denies having associated fever and chills.    Review of Systems: ROS  Past Medical History:  Diagnosis Date   Actinic keratosis 06/04/2021   right anteromedial thigh   Arthritis    Basal cell carcinoma 06/04/2021   right  suprapubic, EDC 07/29/2021   Basal cell carcinoma 07/29/2021   left nasal ala. atypical basaloid cells   Family history of lung cancer    GERD (gastroesophageal reflux disease)    History of basal cell carcinoma 07/29/2021   left thigh, EDC at time of bx   HLD (hyperlipidemia)    Hypertension    Hypothyroidism    Pneumonia    HX OF YEARS AGO   PONV (postoperative nausea and vomiting)     Social History   Tobacco Use   Smoking status: Former    Types: Cigarettes   Smokeless tobacco: Never  Vaping Use   Vaping Use: Never used  Substance Use Topics   Alcohol use: Yes    Comment: RARE   Drug use: Never    Family History  Problem Relation Age of Onset   Cancer Father        Lung Cancer   Cancer Paternal Aunt        mouth   Skin cancer Maternal Grandmother    Skin cancer Paternal Grandmother    Colon cancer Neg Hx    Breast cancer Neg Hx    Ovarian cancer Neg Hx    Endometrial cancer Neg Hx    Pancreatic cancer Neg Hx    Prostate cancer Neg Hx    Scheduled Meds:  diphenhydrAMINE  25 mg Intravenous Q24H   enoxaparin (LOVENOX) injection  40 mg Subcutaneous Q24H   sodium chloride flush  3 mL Intravenous Q12H   Tbo-filgastrim (GRANIX) SQ  300 mcg Subcutaneous q1800   Continuous Infusions:  sodium chloride     ceFEPime (MAXIPIME) IV     ondansetron (ZOFRAN) IV     vancomycin     PRN Meds:.sodium chloride, acetaminophen, alum & mag hydroxide-simeth, guaiFENesin-dextromethorphan, hydrocortisone, ondansetron **OR** ondansetron **OR** ondansetron (ZOFRAN) IV **OR** ondansetron (ZOFRAN) IV, senna-docusate, sodium chloride flush Allergies  Allergen Reactions   Lactose Other (See Comments)    Gi- Upset   Codeine Nausea And Vomiting   Penicillins Rash   Sulfa Antibiotics Rash   Vancomycin Itching and Rash    OBJECTIVE: Blood pressure 138/70, pulse 65, temperature 97.8 F (36.6 C), temperature source Oral, resp. rate 18, height 5\' 3"  (1.6 m), weight 82.7 kg, SpO2 100  %.  Physical Exam Constitutional:      Appearance: Normal appearance.  HENT:     Head: Normocephalic and atraumatic.     Right Ear: Tympanic membrane normal.     Left Ear: Tympanic membrane normal.     Nose: Nose normal.     Mouth/Throat:     Mouth: Mucous membranes are moist.  Eyes:     Extraocular Movements: Extraocular movements intact.     Conjunctiva/sclera: Conjunctivae normal.     Pupils: Pupils are equal, round, and reactive to light.  Cardiovascular:     Rate and Rhythm: Normal rate and regular rhythm.     Heart sounds: No murmur heard.   No friction rub. No gallop.     Comments: Rt chest port site redness and tenderness to palpation Pulmonary:     Effort: Pulmonary effort is normal.     Breath sounds: Normal breath sounds.  Abdominal:     General: Abdomen is flat.     Palpations: Abdomen is soft.  Musculoskeletal:        General: Normal range of motion.  Skin:    General: Skin is warm and dry.  Neurological:     General: No focal deficit present.     Mental Status: She is alert and oriented to person, place, and time.  Psychiatric:        Mood and Affect: Mood normal.    Lab Results Lab Results  Component Value Date   WBC 3.1 (L) 01/06/2022   HGB 10.3 (L) 01/06/2022   HCT 31.9 (L) 01/06/2022   MCV 94.9 01/06/2022   PLT 125 (L) 01/06/2022    Lab Results  Component Value Date   CREATININE 0.89 01/05/2022   BUN 15 01/05/2022   NA 137 01/05/2022   K 4.3 01/05/2022   CL 103 01/05/2022   CO2 28 01/05/2022    Lab Results  Component Value Date   ALT 18 01/05/2022   AST 25 01/05/2022   ALKPHOS 70 01/05/2022   BILITOT 0.4 01/05/2022       Laurice Record, [facility] 01/06/2022, 3:03 PM

## 2022-01-07 ENCOUNTER — Telehealth: Payer: Self-pay | Admitting: *Deleted

## 2022-01-07 DIAGNOSIS — T80212A Local infection due to central venous catheter, initial encounter: Secondary | ICD-10-CM | POA: Diagnosis not present

## 2022-01-07 DIAGNOSIS — D61818 Other pancytopenia: Secondary | ICD-10-CM | POA: Diagnosis not present

## 2022-01-07 LAB — CBC WITH DIFFERENTIAL/PLATELET
Abs Immature Granulocytes: 0.26 10*3/uL — ABNORMAL HIGH (ref 0.00–0.07)
Basophils Absolute: 0 10*3/uL (ref 0.0–0.1)
Basophils Relative: 1 %
Eosinophils Absolute: 0.1 10*3/uL (ref 0.0–0.5)
Eosinophils Relative: 3 %
HCT: 33.4 % — ABNORMAL LOW (ref 36.0–46.0)
Hemoglobin: 10.5 g/dL — ABNORMAL LOW (ref 12.0–15.0)
Immature Granulocytes: 8 %
Lymphocytes Relative: 37 %
Lymphs Abs: 1.2 10*3/uL (ref 0.7–4.0)
MCH: 30.5 pg (ref 26.0–34.0)
MCHC: 31.4 g/dL (ref 30.0–36.0)
MCV: 97.1 fL (ref 80.0–100.0)
Monocytes Absolute: 0.8 10*3/uL (ref 0.1–1.0)
Monocytes Relative: 25 %
Neutro Abs: 0.8 10*3/uL — ABNORMAL LOW (ref 1.7–7.7)
Neutrophils Relative %: 26 %
Platelets: 124 10*3/uL — ABNORMAL LOW (ref 150–400)
RBC: 3.44 MIL/uL — ABNORMAL LOW (ref 3.87–5.11)
RDW: 14.2 % (ref 11.5–15.5)
WBC: 3.2 10*3/uL — ABNORMAL LOW (ref 4.0–10.5)
nRBC: 0 % (ref 0.0–0.2)

## 2022-01-07 LAB — CREATININE, SERUM
Creatinine, Ser: 0.83 mg/dL (ref 0.44–1.00)
GFR, Estimated: >60 mL/min (ref >60)

## 2022-01-07 MED ORDER — DOXYCYCLINE HYCLATE 100 MG PO TABS
100.0000 mg | ORAL_TABLET | Freq: Two times a day (BID) | ORAL | Status: DC
  Administered 2022-01-07 – 2022-01-08 (×2): 100 mg via ORAL
  Filled 2022-01-07 (×2): qty 1

## 2022-01-07 MED ORDER — VITAMIN D 25 MCG (1000 UNIT) PO TABS
2000.0000 [IU] | ORAL_TABLET | Freq: Every day | ORAL | Status: DC
  Administered 2022-01-07 – 2022-01-08 (×2): 2000 [IU] via ORAL
  Filled 2022-01-07 (×2): qty 2

## 2022-01-07 MED ORDER — HYDROMORPHONE HCL 2 MG PO TABS
2.0000 mg | ORAL_TABLET | Freq: Three times a day (TID) | ORAL | Status: DC | PRN
  Administered 2022-01-07 (×2): 2 mg via ORAL
  Filled 2022-01-07 (×2): qty 1

## 2022-01-07 MED ORDER — CIPROFLOXACIN HCL 500 MG PO TABS
500.0000 mg | ORAL_TABLET | Freq: Two times a day (BID) | ORAL | Status: DC
  Administered 2022-01-07 – 2022-01-08 (×2): 500 mg via ORAL
  Filled 2022-01-07 (×2): qty 1

## 2022-01-07 MED ORDER — ADULT MULTIVITAMIN W/MINERALS CH
1.0000 | ORAL_TABLET | Freq: Every day | ORAL | Status: DC
  Administered 2022-01-07 – 2022-01-08 (×2): 1 via ORAL
  Filled 2022-01-07 (×2): qty 1

## 2022-01-07 MED ORDER — ROSUVASTATIN CALCIUM 5 MG PO TABS
5.0000 mg | ORAL_TABLET | Freq: Every day | ORAL | Status: DC
  Administered 2022-01-07: 5 mg via ORAL
  Filled 2022-01-07: qty 1

## 2022-01-07 MED ORDER — LEVOTHYROXINE SODIUM 25 MCG PO TABS
125.0000 ug | ORAL_TABLET | Freq: Every day | ORAL | Status: DC
  Administered 2022-01-07 – 2022-01-08 (×2): 125 ug via ORAL
  Filled 2022-01-07 (×2): qty 1

## 2022-01-07 MED ORDER — LOSARTAN POTASSIUM 50 MG PO TABS
50.0000 mg | ORAL_TABLET | Freq: Every day | ORAL | Status: DC
  Administered 2022-01-07 – 2022-01-08 (×2): 50 mg via ORAL
  Filled 2022-01-07 (×2): qty 1

## 2022-01-07 MED ORDER — MAGNESIUM OXIDE -MG SUPPLEMENT 400 (240 MG) MG PO TABS
400.0000 mg | ORAL_TABLET | Freq: Every day | ORAL | Status: DC
  Administered 2022-01-07 – 2022-01-08 (×2): 400 mg via ORAL
  Filled 2022-01-07 (×2): qty 1

## 2022-01-07 MED ORDER — OMEGA-3-ACID ETHYL ESTERS 1 G PO CAPS
1.0000 g | ORAL_CAPSULE | Freq: Every day | ORAL | Status: DC
  Administered 2022-01-07 – 2022-01-08 (×2): 1 g via ORAL
  Filled 2022-01-07 (×2): qty 1

## 2022-01-07 MED ORDER — PROPRANOLOL HCL 20 MG PO TABS
40.0000 mg | ORAL_TABLET | Freq: Two times a day (BID) | ORAL | Status: DC
  Administered 2022-01-07 – 2022-01-08 (×3): 40 mg via ORAL
  Filled 2022-01-07 (×3): qty 2

## 2022-01-07 MED ORDER — SIMETHICONE 80 MG PO CHEW: 80.0000 mg | CHEWABLE_TABLET | Freq: Four times a day (QID) | ORAL | Status: DC | PRN

## 2022-01-07 MED ORDER — TEMAZEPAM 15 MG PO CAPS: 15.0000 mg | ORAL_CAPSULE | Freq: Every evening | ORAL | Status: DC | PRN

## 2022-01-07 MED ORDER — TRAZODONE HCL 50 MG PO TABS: 50.0000 mg | ORAL_TABLET | Freq: Every evening | ORAL | Status: DC | PRN

## 2022-01-07 NOTE — Progress Notes (Signed)
End of shift  Pt A&Ox4, Independent in the room.  Anticipate pt discharge 2/9

## 2022-01-07 NOTE — Progress Notes (Signed)
[facility]  Date of Admission:  01/05/2022   Total days of inpatient antibiotics 3  Principal Problem:   Port or reservoir infection          Assessment: 39 YF with endometrial cancer admitted for port site infection(placed on 11/07/21) form heme-onc clinic.   #Central line infection  - 12/23 she received doxy+ keflex(x7d) for port site infection->followed by doxy x 10d. She had interim improvement of prot site erythema.  On 2/2 started on doxy x 10d for continued redness/drainage at port site -She continued to have drainage from her port on doxy as such admitted for further evaluation and IV antibiotics. Pt reports port has been accessed once in December for infusion. Then accessed again for labs.  -She denies being febrile -SP port removal on 2/7, minimal infected material noted in pocket, no Cx -TTE showed  no sign of vegetation Recommendations:  -D/C cefepime and vancomycin -Transition to doxycyline and ciprofloxacin(pt states she tolerated in the past) x 14 days from prot removal(EOT 2/20)  -EKG to evaluate qtc -Blood Cx remain negative -Follow-up with ID(myself following discharge.     Microbiology:   Antibiotics: Vancomycin and meropenem 2/6-p   Cultures: Blood 2/6 1/1 NGTD     SUBJECTIVE: Resting in chair. No new complaints  Interval: SP port removal. Remain afebrile Review of Systems: Review of Systems  All other systems reviewed and are negative.   Scheduled Meds:  diphenhydrAMINE  25 mg Intravenous Q24H   enoxaparin (LOVENOX) injection  40 mg Subcutaneous Q24H   sodium chloride flush  3 mL Intravenous Q12H   Tbo-filgastrim (GRANIX) SQ  300 mcg Subcutaneous q1800   Continuous Infusions:  sodium chloride     ceFEPime (MAXIPIME) IV 2 g (01/07/22 0536)   ondansetron (ZOFRAN) IV     vancomycin 75 mL/hr at 01/06/22 1823   PRN Meds:.sodium chloride, acetaminophen, alum & mag hydroxide-simeth,  guaiFENesin-dextromethorphan, hydrocortisone, HYDROmorphone, lidocaine-EPINEPHrine, ondansetron **OR** ondansetron **OR** ondansetron (ZOFRAN) IV **OR** ondansetron (ZOFRAN) IV, senna-docusate, sodium chloride flush, traZODone Allergies  Allergen Reactions   Lactose Other (See Comments)    Gi- Upset   Codeine Nausea And Vomiting   Penicillins Rash   Sulfa Antibiotics Rash   Vancomycin Itching and Rash    OBJECTIVE: Vitals:   01/06/22 0440 01/06/22 1800 01/06/22 2116 01/07/22 0651  BP: 138/70 131/73 (!) 153/73 (!) 142/73  Pulse: 65 72 74 61  Resp: 18 16 14 14   Temp: 97.8 F (36.6 C) 97.7 F (36.5 C) 98.2 F (36.8 C) (!) 97.3 F (36.3 C)  TempSrc: Oral Oral Oral Oral  SpO2: 100% 96% 95% 99%  Weight:      Height:       Body mass index is 32.29 kg/m.  Physical Exam Constitutional:      Appearance: Normal appearance.  HENT:     Head: Normocephalic and atraumatic.     Right Ear: Tympanic membrane normal.     Left Ear: Tympanic membrane normal.     Nose: Nose normal.     Mouth/Throat:     Mouth: Mucous membranes are moist.  Eyes:     Extraocular Movements: Extraocular movements intact.     Conjunctiva/sclera: Conjunctivae normal.     Pupils: Pupils are equal, round, and reactive to light.  Cardiovascular:     Rate and Rhythm: Normal rate and regular rhythm.     Heart sounds: No murmur heard.   No friction rub. No gallop.  Comments: Right chest wound C/DI Pulmonary:     Effort: Pulmonary effort is normal.     Breath sounds: Normal breath sounds.  Abdominal:     General: Abdomen is flat.     Palpations: Abdomen is soft.  Musculoskeletal:        General: Normal range of motion.  Skin:    General: Skin is warm and dry.  Neurological:     General: No focal deficit present.     Mental Status: She is alert and oriented to person, place, and time.  Psychiatric:        Mood and Affect: Mood normal.      Lab Results Lab Results  Component Value Date   WBC 3.2  (L) 01/07/2022   HGB 10.5 (L) 01/07/2022   HCT 33.4 (L) 01/07/2022   MCV 97.1 01/07/2022   PLT 124 (L) 01/07/2022    Lab Results  Component Value Date   CREATININE 0.83 01/07/2022   BUN 15 01/05/2022   NA 137 01/05/2022   K 4.3 01/05/2022   CL 103 01/05/2022   CO2 28 01/05/2022    Lab Results  Component Value Date   ALT 18 01/05/2022   AST 25 01/05/2022   ALKPHOS 70 01/05/2022   BILITOT 0.4 01/05/2022        Kristin Carlson, [facility] 01/07/2022, 7:56 AM

## 2022-01-07 NOTE — Progress Notes (Signed)
Kristin Carlson   DOB:September 08, 1941   HE#:[id number]    ASSESSMENT & PLAN:  Endometrial cancer (Kristin Carlson) Continue supportive care   Pancytopenia, acquired [facility]) She has severe pancytopenia due to recent treatment Her leukocyte count is slightly improved with G-CSF Continue another day, hopefully can Dc tomorrow   Infected venous access port, initial encounter She has been on multiple courses of antibiotics Unfortunately, she has persistent drainage and worsening signs of infection despite recent course of doxycycline As above, I recommend admission to the hospital for port extraction and IV antibiotics and she is in agreement Appreciate ID consult  Blood culture was drawn on 01/05/2022, results pending, but so far unremarkable   Code Status Full   Goals of care Resolution of infection   Discharge planning I am hopeful she can be DC tomorrow once her [place] improves All questions were answered. The patient knows to call the clinic with any problems, questions or concerns.   The total time spent in the appointment was 40 minutes encounter with patients including review of chart and various tests results, discussions about plan of care and coordination of care plan  Kristin Lark, MD 01/07/2022 8:15 AM  Subjective:  Reviewed results of ECHO. According to her, the extraction procedure was traumatic. She has soreness at the site. Afebrile. [place] dropped this morning.   Objective:  Vitals:   01/06/22 2116 01/07/22 0651  BP: (!) 153/73 (!) 142/73  Pulse: 74 61  Resp: 14 14  Temp: 98.2 F (36.8 C) (!) 97.3 F (36.3 C)  SpO2: 95% 99%     Intake/Output Summary (Last 24 hours) at 01/07/2022 0815 Last data filed at 01/07/2022 0536 Gross per 24 hour  Intake 1130.11 ml  Output --  Net 1130.11 ml    GENERAL:alert, no distress and comfortable NEURO: alert & oriented x 3 with fluent speech, no focal motor/sensory deficits   Labs:  Recent Labs    12/04/21 1011 12/25/21 1051 01/05/22 1055  01/07/22 0524  NA 136 136 137  --   K 4.5 4.2 4.3  --   CL 103 104 103  --   CO2 21* 27 28  --   GLUCOSE 162* 104* 92  --   BUN 21 16 15   --   CREATININE 0.91 0.73 0.89 0.83  CALCIUM 9.1 9.0 9.2  --   GFRNONAA >60 >60 >60 >60  PROT 7.4 6.7 6.8  --   ALBUMIN 4.0 3.8 3.8  --   AST 21 22 25   --   ALT 15 16 18   --   ALKPHOS 77 70 70  --   BILITOT 0.4 0.4 0.4  --     Studies:  ECHOCARDIOGRAM COMPLETE BUBBLE STUDY  Result Date: 01/06/2022    ECHOCARDIOGRAM REPORT   Patient Name:   Kristin Carlson Date of Exam: 01/06/2022 Medical Rec #:  [id number]    Height:       63.0 in Accession #:    [id number]   Weight:       182.3 lb Date of Birth:  December 19, 1940     BSA:          1.859 m Patient Age:    81 years     BP:           138/70 mmHg Patient Gender: F            HR:           72 bpm. Exam Location:  Inpatient Procedure: 2D Echo Indications:  Port infection  History:        Patient has no prior history of Echocardiogram examinations.                 Risk Factors:Hypertension.  Sonographer:    Jefferey Pica Referring Phys: [id number] Kristin Carlson  1. Left ventricular ejection fraction, by estimation, is 60 to 65%. The left ventricle has normal function. The left ventricle has no regional wall motion abnormalities. There is mild concentric left ventricular hypertrophy. Left ventricular diastolic parameters are consistent with Grade I diastolic dysfunction (impaired relaxation).  2. Right ventricular systolic function is normal. The right ventricular size is normal. There is mildly elevated pulmonary artery systolic pressure.  3. Left atrial size was mildly dilated.  4. The mitral valve is normal in structure. Mild mitral valve regurgitation. No evidence of mitral stenosis.  5. The aortic valve is tricuspid. There is mild calcification of the aortic valve. There is mild thickening of the aortic valve. Aortic valve regurgitation is mild. Aortic valve sclerosis is present, with no evidence of aortic  valve stenosis.  6. The inferior vena cava is normal in size with greater than 50% respiratory variability, suggesting right atrial pressure of 3 mmHg.  7. Agitated saline contrast bubble study was negative, with no evidence of any interatrial shunt. FINDINGS  Left Ventricle: Left ventricular ejection fraction, by estimation, is 60 to 65%. The left ventricle has normal function. The left ventricle has no regional wall motion abnormalities. The left ventricular internal cavity size was normal in size. There is  mild concentric left ventricular hypertrophy. Left ventricular diastolic parameters are consistent with Grade I diastolic dysfunction (impaired relaxation). Normal left ventricular filling pressure. Right Ventricle: The right ventricular size is normal. No increase in right ventricular wall thickness. Right ventricular systolic function is normal. There is mildly elevated pulmonary artery systolic pressure. The tricuspid regurgitant velocity is 3.07  m/s, and with an assumed right atrial pressure of 5 mmHg, the estimated right ventricular systolic pressure is 32.2 mmHg. Left Atrium: Left atrial size was mildly dilated. Right Atrium: Right atrial size was normal in size. Pericardium: There is no evidence of pericardial effusion. Mitral Valve: The mitral valve is normal in structure. Mild mitral annular calcification. Mild mitral valve regurgitation. No evidence of mitral valve stenosis. Tricuspid Valve: The tricuspid valve is normal in structure. Tricuspid valve regurgitation is mild . No evidence of tricuspid stenosis. Aortic Valve: The aortic valve is tricuspid. There is mild calcification of the aortic valve. There is mild thickening of the aortic valve. Aortic valve regurgitation is mild. Aortic regurgitation PHT measures 488 msec. Aortic valve sclerosis is present,  with no evidence of aortic valve stenosis. Aortic valve peak gradient measures 8.8 mmHg. Pulmonic Valve: The pulmonic valve was normal in  structure. Pulmonic valve regurgitation is trivial. No evidence of pulmonic stenosis. Aorta: The aortic root is normal in size and structure. Venous: The inferior vena cava is normal in size with greater than 50% respiratory variability, suggesting right atrial pressure of 3 mmHg. IAS/Shunts: No atrial level shunt detected by color flow Doppler. Agitated saline contrast was given intravenously to evaluate for intracardiac shunting. Agitated saline contrast bubble study was negative, with no evidence of any interatrial shunt.  LEFT VENTRICLE PLAX 2D LVIDd:         4.40 cm Diastology LVIDs:         2.15 cm LV e' medial:    6.16 cm/s LV PW:  1.20 cm LV E/e' medial:  10.8 LV IVS:        1.40 cm LV e' lateral:   6.73 cm/s                        LV E/e' lateral: 9.9  RIGHT VENTRICLE             IVC RV Basal diam:  2.20 cm     IVC diam: 1.80 cm RV S prime:     15.60 cm/s LEFT ATRIUM             Index        RIGHT ATRIUM           Index LA diam:        3.80 cm 2.04 cm/m   RA Area:     13.20 cm LA Vol (A2C):   52.5 ml 28.24 ml/m  RA Volume:   28.70 ml  15.44 ml/m LA Vol (A4C):   72.5 ml 39.00 ml/m LA Biplane Vol: 63.1 ml 33.94 ml/m  AORTIC VALVE              PULMONIC VALVE AV Vmax:      148.50 cm/s PV Vmax:       0.81 m/s AV Peak Grad: 8.8 mmHg    PV Peak grad:  2.6 mmHg LVOT Vmax:    122.00 cm/s LVOT Vmean:   87.200 cm/s LVOT VTI:     0.310 m AI PHT:       488 msec  AORTA Ao Root diam: 3.30 cm Ao Asc diam:  3.40 cm MITRAL VALVE                TRICUSPID VALVE MV Area (PHT): 3.56 cm     TR Peak grad:   37.7 mmHg MV Decel Time: 213 msec     TR Vmax:        307.00 cm/s MV E velocity: 66.70 cm/s MV A velocity: 102.00 cm/s  SHUNTS MV E/A ratio:  0.65         Systemic VTI: 0.31 m Dani Gobble Croitoru MD Electronically signed by Sanda Klein MD Signature Date/Time: 01/06/2022/4:11:20 PM    Final

## 2022-01-07 NOTE — Progress Notes (Signed)
°  Transition of Care (TOC) Screening Note   Patient Details  Name: Kristin Carlson Date of Birth: [birth year]   Transition of Care [facility]) CM/SW Contact:    Lennart Pall, LCSW Phone Number: 01/07/2022, 10:04 AM    Transition of Care Department [facility]) has reviewed patient and no TOC needs have been identified at this time. We will continue to monitor patient advancement through interdisciplinary progression rounds. If new patient transition needs arise, please place a TOC consult.

## 2022-01-07 NOTE — Telephone Encounter (Signed)
CALLED PATIENT TO REMIND OF HDR [place] 01-08-22 @ 2 PM, SPOKE WITH PATIENT AND SHE IS AWARE OF THIS [place].

## 2022-01-07 NOTE — Progress Notes (Addendum)
°  Radiation Oncology         [phone] ________________________________  Name: Kristin Carlson MRN: [id number]  Date: 01/08/2022  DOB: 1941/06/30  CC: [name], FNP  Lafonda Mosses, MD  HDR BRACHYTHERAPY NOTE - Inpatient   DIAGNOSIS: Stage III (pT3, pN0, cM0) endometrial cancer, high-grade serous   Simple treatment device note: Patient had construction of her custom vaginal cylinder. She will be treated with a 2.5 cm diameter segmented cylinder. This conforms to her anatomy without undue discomfort.  Vaginal brachytherapy procedure node: The patient was brought to the [place] suite. Identity was confirmed. All relevant records and images related to the planned course of therapy were reviewed. The patient freely provided informed written consent to proceed with treatment after reviewing the details related to the planned course of therapy. The consent form was witnessed and verified by the simulation staff. Then, the patient was set-up in a stable reproducible supine position for radiation therapy. Pelvic exam revealed the vaginal cuff to be intact . The patient's custom vaginal cylinder was placed in the proximal vagina. This was affixed to the CT/MR stabilization plate to prevent slippage. Patient tolerated the placement well.  Verification simulation note:  A fiducial marker was placed within the vaginal cylinder. An AP and lateral film was then obtained through the pelvis area. This documented accurate position of the vaginal cylinder for treatment.  HDR BRACHYTHERAPY TREATMENT  The remote afterloading device was affixed to the vaginal cylinder by catheter. Patient then proceeded to undergo her fourth high-dose-rate treatment directed at the proximal vagina. The patient was prescribed a dose of 6.0 gray to be delivered to the mucosal surface. Treatment length was 3.0 cm. Patient was treated with 1 channel using 7 dwell positions. Treatment time was 167.9 seconds. Iridium 192 was the  high-dose-rate source for treatment. The patient tolerated the treatment well. After completion of her therapy, a radiation survey was performed documenting return of the iridium source into the [employer] safe.   PLAN: The patient will return next week for her fifth high-dose-rate treatment.  She has been admitted over the past few days for management of an infected Port-A-Cath.  She however is scheduled to go home later today after her radiation treatment. ________________________________  -----------------------------------  Blair Promise, PhD, MD  This document serves as a record of services personally performed by Gery Pray, MD. It was created on his behalf by Roney Mans, a trained medical scribe. The creation of this record is based on the scribe's personal observations and the provider's statements to them. This document has been checked and approved by the attending provider.

## 2022-01-08 ENCOUNTER — Telehealth: Payer: Self-pay | Admitting: Oncology

## 2022-01-08 ENCOUNTER — Ambulatory Visit
Admission: RE | Admit: 2022-01-08 | Discharge: 2022-01-08 | Disposition: A | Discharge status: Home / Self Care | Payer: Medicare Other | Source: Ambulatory Visit | Attending: Radiation Oncology | Admitting: Radiation Oncology

## 2022-01-08 DIAGNOSIS — C541 Malignant neoplasm of endometrium: Secondary | ICD-10-CM

## 2022-01-08 DIAGNOSIS — T80212A Local infection due to central venous catheter, initial encounter: Secondary | ICD-10-CM | POA: Diagnosis not present

## 2022-01-08 DIAGNOSIS — D61818 Other pancytopenia: Secondary | ICD-10-CM | POA: Diagnosis not present

## 2022-01-08 LAB — CBC WITH DIFFERENTIAL/PLATELET
Abs Immature Granulocytes: 0.03 10*3/uL (ref 0.00–0.07)
Basophils Absolute: 0 10*3/uL (ref 0.0–0.1)
Basophils Relative: 0 %
Eosinophils Absolute: 0.1 10*3/uL (ref 0.0–0.5)
Eosinophils Relative: 2 %
HCT: 31.6 % — ABNORMAL LOW (ref 36.0–46.0)
Hemoglobin: 10.2 g/dL — ABNORMAL LOW (ref 12.0–15.0)
Immature Granulocytes: 0 %
Lymphocytes Relative: 23 %
Lymphs Abs: 1.6 10*3/uL (ref 0.7–4.0)
MCH: 31.2 pg (ref 26.0–34.0)
MCHC: 32.3 g/dL (ref 30.0–36.0)
MCV: 96.6 fL (ref 80.0–100.0)
Monocytes Absolute: 1.2 10*3/uL — ABNORMAL HIGH (ref 0.1–1.0)
Monocytes Relative: 17 %
Neutro Abs: 4 10*3/uL (ref 1.7–7.7)
Neutrophils Relative %: 58 %
Platelets: 133 10*3/uL — ABNORMAL LOW (ref 150–400)
RBC: 3.27 MIL/uL — ABNORMAL LOW (ref 3.87–5.11)
RDW: 14.4 % (ref 11.5–15.5)
WBC: 7 10*3/uL (ref 4.0–10.5)
nRBC: 0.4 % — ABNORMAL HIGH (ref 0.0–0.2)

## 2022-01-08 LAB — COMPREHENSIVE METABOLIC PANEL
ALT: 17 U/L (ref 0–44)
AST: 20 U/L (ref 15–41)
Albumin: 2.9 g/dL — ABNORMAL LOW (ref 3.5–5.0)
Alkaline Phosphatase: 59 U/L (ref 38–126)
Anion gap: 8 (ref 5–15)
BUN: 15 mg/dL (ref 8–23)
CO2: 22 mmol/L (ref 22–32)
Calcium: 8.1 mg/dL — ABNORMAL LOW (ref 8.9–10.3)
Chloride: 105 mmol/L (ref 98–111)
Creatinine, Ser: 0.88 mg/dL (ref 0.44–1.00)
GFR, Estimated: >60 mL/min (ref >60)
Glucose, Bld: 100 mg/dL — ABNORMAL HIGH (ref 70–99)
Potassium: 3.9 mmol/L (ref 3.5–5.1)
Sodium: 135 mmol/L (ref 135–145)
Total Bilirubin: 0.1 mg/dL — ABNORMAL LOW (ref 0.3–1.2)
Total Protein: 5.5 g/dL — ABNORMAL LOW (ref 6.5–8.1)

## 2022-01-08 MED ORDER — CIPROFLOXACIN HCL 250 MG PO TABS: 250.0000 mg | ORAL_TABLET | Freq: Two times a day (BID) | ORAL | 0 refills | Status: DC

## 2022-01-08 MED ORDER — DOXYCYCLINE HYCLATE 100 MG PO TABS: 100.0000 mg | ORAL_TABLET | Freq: Two times a day (BID) | ORAL | 0 refills | Status: DC

## 2022-01-08 NOTE — Care Management Important Message (Signed)
Important Message  Patient Details IM Letter placed in Patients room. Name: Kristin Carlson MRN: [id number] Date of Birth: 12-Jul-1941   Medicare Important Message Given:  Yes     Kerin Salen 01/08/2022, 10:13 AM

## 2022-01-08 NOTE — Discharge Summary (Signed)
Physician Discharge Summary  Patient ID: Kristin Carlson MRN: [id number] DOB/AGE: July 18, 1941 81 y.o.  Admit date: 01/05/2022 Discharge date: 01/08/2022  Primary Care Physician:  [name], FNP   Discharge Diagnoses:    Present on Admission:  Port or reservoir infection   Discharge Medications:  Allergies as of 01/08/2022       Reactions   Lactose Other (See Comments)   Gi- Upset   Codeine Nausea And Vomiting   Penicillins Rash   Sulfa Antibiotics Rash   Vancomycin Itching, Rash        Medication List     STOP taking these medications    acetaminophen 500 MG tablet Commonly known as: TYLENOL   dexamethasone 4 MG tablet Commonly known as: DECADRON   ibuprofen 200 MG tablet Commonly known as: ADVIL   lidocaine-prilocaine cream Commonly known as: EMLA   prochlorperazine 10 MG tablet Commonly known as: COMPAZINE   temazepam 15 MG capsule Commonly known as: RESTORIL       TAKE these medications    Cholecalciferol 50 MCG (2000 UT) Caps Take 2,000 Units by mouth daily.   ciprofloxacin 250 MG tablet Commonly known as: CIPRO Take 1 tablet (250 mg total) by mouth 2 (two) times daily.   doxycycline 100 MG tablet Commonly known as: VIBRA-TABS Take 1 tablet (100 mg total) by mouth every 12 (twelve) hours. What changed: when to take this   levothyroxine 125 MCG tablet Commonly known as: SYNTHROID Take 125 mcg by mouth daily before breakfast.   losartan 50 MG tablet Commonly known as: COZAAR Take 50 mg by mouth daily.   magnesium oxide 400 MG tablet Commonly known as: MAG-OX Take 200 mg by mouth 2 (two) times daily.   MULTIVITAMIN WOMEN 50+ PO Take 1 tablet by mouth daily.   Omega-3 1000 MG Caps Take 1,000 mg by mouth daily.   ondansetron 8 MG tablet Commonly known as: Zofran Take 1 tablet (8 mg total) by mouth every 8 (eight) hours as needed. What changed: reasons to take this   pantoprazole 20 MG tablet Commonly known as:  PROTONIX Take 20 mg by mouth daily as needed for heartburn or indigestion.   propranolol 40 MG tablet Commonly known as: INDERAL Take 40 mg by mouth 2 (two) times daily.   rosuvastatin 5 MG tablet Commonly known as: CRESTOR Take 5 mg by mouth every evening.   Simethicone 125 MG Tabs Take 250 mg by mouth 2 (two) times daily as needed (gas).        Disposition and Follow-up:   Significant Diagnostic Studies:  IR REMOVAL TUN ACCESS W/ PORT W/O FL MOD SED  Result Date: 01/07/2022 INDICATION: Port removal, wound dehiscence with infection EXAM: Port removal MEDICATIONS: None ANESTHESIA/SEDATION: Local analgesia FLUOROSCOPY TIME:  N/a COMPLICATIONS: None immediate. PROCEDURE: Informed written consent was obtained from the patient after a thorough discussion of the procedural risks, benefits and alternatives. All questions were addressed. Maximal Sterile Barrier Technique was utilized including caps, mask, sterile gowns, sterile gloves, sterile drape, hand hygiene and skin antiseptic. A timeout was performed prior to the initiation of the procedure. The right chest was prepped and draped in the standard sterile fashion. Lidocaine was utilized for local anesthesia. An incision was made over the previously incision, which was noted to be partially dehisced. Utilizing blunt dissection, the port catheter and reservoir were removed from the underlying subcutaneous tissue in their entirety. Scant purulent/fibrinous material was noted, and the port pocket was irrigated copiously. After hemostasis, the  subcutaneous pockets was approximated but left open using interrupted 3-0 Vicryl sutures. The pocket was packed with Hydrogel. The patient tolerated the procedure well without immediate complication. IMPRESSION: Successful removal of right chest port following wound dehiscence. Port pocket approximated but left open, packed with Hydrogel. The patient will be followed in interventional radiology clinic for wound  care. Electronically Signed   By: Albin Felling M.D.   On: 01/07/2022 09:12   ECHOCARDIOGRAM COMPLETE BUBBLE STUDY  Result Date: 01/06/2022    ECHOCARDIOGRAM REPORT   Patient Name:   Kristin Carlson Date of Exam: 01/06/2022 Medical Rec #:  [id number]    Height:       63.0 in Accession #:    [id number]   Weight:       182.3 lb Date of Birth:  06-01-1941     BSA:          1.859 m Patient Age:    4 years     BP:           138/70 mmHg Patient Gender: F            HR:           72 bpm. Exam Location:  Inpatient Procedure: 2D Echo Indications:    Port infection  History:        Patient has no prior history of Echocardiogram examinations.                 Risk Factors:Hypertension.  Sonographer:    Jefferey Pica Referring Phys: [id number] Andrew Olmito and Olmito  1. Left ventricular ejection fraction, by estimation, is 60 to 65%. The left ventricle has normal function. The left ventricle has no regional wall motion abnormalities. There is mild concentric left ventricular hypertrophy. Left ventricular diastolic parameters are consistent with Grade I diastolic dysfunction (impaired relaxation).  2. Right ventricular systolic function is normal. The right ventricular size is normal. There is mildly elevated pulmonary artery systolic pressure.  3. Left atrial size was mildly dilated.  4. The mitral valve is normal in structure. Mild mitral valve regurgitation. No evidence of mitral stenosis.  5. The aortic valve is tricuspid. There is mild calcification of the aortic valve. There is mild thickening of the aortic valve. Aortic valve regurgitation is mild. Aortic valve sclerosis is present, with no evidence of aortic valve stenosis.  6. The inferior vena cava is normal in size with greater than 50% respiratory variability, suggesting right atrial pressure of 3 mmHg.  7. Agitated saline contrast bubble study was negative, with no evidence of any interatrial shunt. FINDINGS  Left Ventricle: Left ventricular ejection fraction, by  estimation, is 60 to 65%. The left ventricle has normal function. The left ventricle has no regional wall motion abnormalities. The left ventricular internal cavity size was normal in size. There is  mild concentric left ventricular hypertrophy. Left ventricular diastolic parameters are consistent with Grade I diastolic dysfunction (impaired relaxation). Normal left ventricular filling pressure. Right Ventricle: The right ventricular size is normal. No increase in right ventricular wall thickness. Right ventricular systolic function is normal. There is mildly elevated pulmonary artery systolic pressure. The tricuspid regurgitant velocity is 3.07  m/s, and with an assumed right atrial pressure of 5 mmHg, the estimated right ventricular systolic pressure is 23.7 mmHg. Left Atrium: Left atrial size was mildly dilated. Right Atrium: Right atrial size was normal in size. Pericardium: There is no evidence of pericardial effusion. Mitral Valve: The mitral valve is normal in structure. Mild mitral annular  calcification. Mild mitral valve regurgitation. No evidence of mitral valve stenosis. Tricuspid Valve: The tricuspid valve is normal in structure. Tricuspid valve regurgitation is mild . No evidence of tricuspid stenosis. Aortic Valve: The aortic valve is tricuspid. There is mild calcification of the aortic valve. There is mild thickening of the aortic valve. Aortic valve regurgitation is mild. Aortic regurgitation PHT measures 488 msec. Aortic valve sclerosis is present,  with no evidence of aortic valve stenosis. Aortic valve peak gradient measures 8.8 mmHg. Pulmonic Valve: The pulmonic valve was normal in structure. Pulmonic valve regurgitation is trivial. No evidence of pulmonic stenosis. Aorta: The aortic root is normal in size and structure. Venous: The inferior vena cava is normal in size with greater than 50% respiratory variability, suggesting right atrial pressure of 3 mmHg. IAS/Shunts: No atrial level shunt  detected by color flow Doppler. Agitated saline contrast was given intravenously to evaluate for intracardiac shunting. Agitated saline contrast bubble study was negative, with no evidence of any interatrial shunt.  LEFT VENTRICLE PLAX 2D LVIDd:         4.40 cm Diastology LVIDs:         2.15 cm LV e' medial:    6.16 cm/s LV PW:         1.20 cm LV E/e' medial:  10.8 LV IVS:        1.40 cm LV e' lateral:   6.73 cm/s                        LV E/e' lateral: 9.9  RIGHT VENTRICLE             IVC RV Basal diam:  2.20 cm     IVC diam: 1.80 cm RV S prime:     15.60 cm/s LEFT ATRIUM             Index        RIGHT ATRIUM           Index LA diam:        3.80 cm 2.04 cm/m   RA Area:     13.20 cm LA Vol (A2C):   52.5 ml 28.24 ml/m  RA Volume:   28.70 ml  15.44 ml/m LA Vol (A4C):   72.5 ml 39.00 ml/m LA Biplane Vol: 63.1 ml 33.94 ml/m  AORTIC VALVE              PULMONIC VALVE AV Vmax:      148.50 cm/s PV Vmax:       0.81 m/s AV Peak Grad: 8.8 mmHg    PV Peak grad:  2.6 mmHg LVOT Vmax:    122.00 cm/s LVOT Vmean:   87.200 cm/s LVOT VTI:     0.310 m AI PHT:       488 msec  AORTA Ao Root diam: 3.30 cm Ao Asc diam:  3.40 cm MITRAL VALVE                TRICUSPID VALVE MV Area (PHT): 3.56 cm     TR Peak grad:   37.7 mmHg MV Decel Time: 213 msec     TR Vmax:        307.00 cm/s MV E velocity: 66.70 cm/s MV A velocity: 102.00 cm/s  SHUNTS MV E/A ratio:  0.65         Systemic VTI: 0.31 m Dani Gobble Croitoru MD Electronically signed by Sanda Klein MD Signature Date/Time: 01/06/2022/4:11:20 PM    Final  Discharge Laboratory Values: Lab Results  Component Value Date   WBC 7.0 01/08/2022   HGB 10.2 (L) 01/08/2022   HCT 31.6 (L) 01/08/2022   MCV 96.6 01/08/2022   PLT 133 (L) 01/08/2022   Lab Results  Component Value Date   NA 135 01/08/2022   K 3.9 01/08/2022   CL 105 01/08/2022   CO2 22 01/08/2022    Brief H and P: For complete details please refer to admission H and P, but in brief, the patient was admitted due to  failure of outpatient antibiotics in the setting of Port-A-Cath infection. This is a course of her hospitalization:  Endometrial cancer ([place]) Continue supportive care   Pancytopenia, acquired [facility]) She has severe pancytopenia due to recent treatment Her leukocyte count is back to normal on the day of discharge  Infected venous access port, initial encounter Port was removed.  Blood cultures from 3 days ago was negative.  The patient is discharged with antibiotics per infectious disease recommendation   code Status Full  Physical Exam at Discharge: BP 126/61 (BP Location: Right Arm)    Pulse 70    Temp 98.5 F (36.9 C) (Oral)    Resp 14    Ht 5\' 3"  (1.6 m)    Wt 182 lb 4.8 oz (82.7 kg)    SpO2 94%    BMI 32.29 kg/m  GENERAL:alert, no distress and comfortable NEURO: alert & oriented x 3 with fluent speech, no focal motor/sensory deficits  Hospital Course:  Principal Problem:   Port or reservoir infection   Diet:  Regular  Activity:  As tolerated  Condition at Discharge:   stable  Signed: Dr. Heath Lark [phone]  01/08/2022, 8:04 AM

## 2022-01-08 NOTE — Telephone Encounter (Signed)
Called Kristin Carlson and advised her that her radiation appointment is at 10:00 today.  She verbalized understanding and agreement.

## 2022-01-09 ENCOUNTER — Telehealth: Payer: Self-pay

## 2022-01-09 NOTE — Telephone Encounter (Signed)
-----   Message from Heath Lark, MD sent at 01/09/2022 10:15 AM EST ----- She was DC yesterday, can you call her and ask how she is doing?

## 2022-01-09 NOTE — Telephone Encounter (Signed)
Called and given below message. She verbalized understanding. 

## 2022-01-09 NOTE — Telephone Encounter (Signed)
She can remove it and replace with a bandage

## 2022-01-09 NOTE — Telephone Encounter (Signed)
Called and given below message. She is doing well and no complaints. She is glad to be at home. Over the port site she has a dressing. She thinks that she is to leave it in place until she sees you next week but she is not sure.

## 2022-01-10 LAB — CULTURE, BLOOD (SINGLE): Culture: NO GROWTH

## 2022-01-11 LAB — CULTURE, BLOOD (SINGLE)
Culture: NO GROWTH
Culture: NO GROWTH
Special Requests: ADEQUATE

## 2022-01-12 ENCOUNTER — Telehealth: Payer: Self-pay | Admitting: *Deleted

## 2022-01-12 NOTE — Progress Notes (Signed)
°  Radiation Oncology         [phone] ________________________________  Name: Kristin Carlson MRN: [id number]  Date: 01/13/2022  DOB: 01-20-1941  CC: [name], FNP  Lafonda Mosses, MD  HDR BRACHYTHERAPY NOTE  DIAGNOSIS: Stage III (pT3, pN0, cM0) endometrial cancer, high-grade serous   Simple treatment device note: Patient had construction of her custom vaginal cylinder. She will be treated with a 2.5 cm diameter segmented cylinder. This conforms to her anatomy without undue discomfort.  Vaginal brachytherapy procedure node: The patient was brought to the [facility] suite. Identity was confirmed. All relevant records and images related to the planned course of therapy were reviewed. The patient freely provided informed written consent to proceed with treatment after reviewing the details related to the planned course of therapy. The consent form was witnessed and verified by the simulation staff. Then, the patient was set-up in a stable reproducible supine position for radiation therapy. Pelvic exam revealed the vaginal cuff to be intact . The patient's custom vaginal cylinder was placed in the proximal vagina. This was affixed to the CT/MR stabilization plate to prevent slippage. Patient tolerated the placement well.  Verification simulation note:  A fiducial marker was placed within the vaginal cylinder. An AP and lateral film was then obtained through the pelvis area. This documented accurate position of the vaginal cylinder for treatment.  HDR BRACHYTHERAPY TREATMENT  The remote afterloading device was affixed to the vaginal cylinder by catheter. Patient then proceeded to undergo her fifth high-dose-rate treatment directed at the proximal vagina. The patient was prescribed a dose of 6.0 gray to be delivered to the mucosal surface. Treatment length was 3.0 cm. Patient was treated with 1 channel using 7 dwell positions. Treatment time was 175.9 seconds. Iridium 192 was the high-dose-rate  source for treatment. The patient tolerated the treatment well. After completion of her therapy, a radiation survey was performed documenting return of the iridium source into the [employer] safe.   PLAN: The patient has completed her final high-dose-rate treatment. She will return in one month for routine follow-up.  She will continue on adjuvant chemotherapy.  Overall she tolerated her vaginal brachytherapy well. ________________________________  -----------------------------------  Blair Promise, PhD, MD  This document serves as a record of services personally performed by Gery Pray, MD. It was created on his behalf by Roney Mans, a trained medical scribe. The creation of this record is based on the scribe's personal observations and the provider's statements to them. This document has been checked and approved by the attending provider.

## 2022-01-12 NOTE — Telephone Encounter (Signed)
CALLED PATIENT TO REMIND OF  [facility] [place] 01-13-22 @ 10 AM, SPOKE WITH PATIENT AND SHE IS AWARE OF THIS [name].

## 2022-01-13 ENCOUNTER — Ambulatory Visit
Admission: RE | Admit: 2022-01-13 | Discharge: 2022-01-13 | Disposition: A | Discharge status: Home / Self Care | Payer: Medicare Other | Source: Ambulatory Visit | Attending: Radiation Oncology | Admitting: Radiation Oncology

## 2022-01-13 ENCOUNTER — Other Ambulatory Visit: Payer: Self-pay

## 2022-01-13 ENCOUNTER — Encounter: Payer: Self-pay | Admitting: Radiation Oncology

## 2022-01-13 DIAGNOSIS — C541 Malignant neoplasm of endometrium: Secondary | ICD-10-CM

## 2022-01-14 MED FILL — Fosaprepitant Dimeglumine For IV Infusion 150 MG (Base Eq): INTRAVENOUS | Qty: 5 | Status: AC

## 2022-01-14 MED FILL — Dexamethasone Sodium Phosphate Inj 100 MG/10ML: INTRAMUSCULAR | Qty: 1 | Status: AC

## 2022-01-15 ENCOUNTER — Other Ambulatory Visit: Payer: Self-pay

## 2022-01-15 ENCOUNTER — Inpatient Hospital Stay: Payer: Medicare Other

## 2022-01-15 ENCOUNTER — Inpatient Hospital Stay (HOSPITAL_BASED_OUTPATIENT_CLINIC_OR_DEPARTMENT_OTHER): Payer: Medicare Other | Admitting: Hematology and Oncology

## 2022-01-15 ENCOUNTER — Encounter: Payer: Self-pay | Admitting: Hematology and Oncology

## 2022-01-15 ENCOUNTER — Ambulatory Visit: Payer: Medicare Other | Admitting: Radiation Oncology

## 2022-01-15 DIAGNOSIS — Z5111 Encounter for antineoplastic chemotherapy: Secondary | ICD-10-CM | POA: Insufficient documentation

## 2022-01-15 DIAGNOSIS — C541 Malignant neoplasm of endometrium: Secondary | ICD-10-CM

## 2022-01-15 DIAGNOSIS — I10 Essential (primary) hypertension: Secondary | ICD-10-CM | POA: Insufficient documentation

## 2022-01-15 DIAGNOSIS — C50412 Malignant neoplasm of upper-outer quadrant of left female breast: Secondary | ICD-10-CM | POA: Insufficient documentation

## 2022-01-15 DIAGNOSIS — D61818 Other pancytopenia: Secondary | ICD-10-CM | POA: Diagnosis not present

## 2022-01-15 DIAGNOSIS — L539 Erythematous condition, unspecified: Secondary | ICD-10-CM | POA: Insufficient documentation

## 2022-01-15 DIAGNOSIS — R0989 Other specified symptoms and signs involving the circulatory and respiratory systems: Secondary | ICD-10-CM | POA: Insufficient documentation

## 2022-01-15 LAB — CBC WITH DIFFERENTIAL (CANCER CENTER ONLY)
Abs Immature Granulocytes: 0.04 10*3/uL (ref 0.00–0.07)
Basophils Absolute: 0 10*3/uL (ref 0.0–0.1)
Basophils Relative: 0 %
Eosinophils Absolute: 0 10*3/uL (ref 0.0–0.5)
Eosinophils Relative: 0 %
HCT: 32.8 % — ABNORMAL LOW (ref 36.0–46.0)
Hemoglobin: 11.1 g/dL — ABNORMAL LOW (ref 12.0–15.0)
Immature Granulocytes: 1 %
Lymphocytes Relative: 11 %
Lymphs Abs: 0.7 10*3/uL (ref 0.7–4.0)
MCH: 30.8 pg (ref 26.0–34.0)
MCHC: 33.8 g/dL (ref 30.0–36.0)
MCV: 91.1 fL (ref 80.0–100.0)
Monocytes Absolute: 0 10*3/uL — ABNORMAL LOW (ref 0.1–1.0)
Monocytes Relative: 0 %
Neutro Abs: 5.3 10*3/uL (ref 1.7–7.7)
Neutrophils Relative %: 88 %
Platelet Count: 185 10*3/uL (ref 150–400)
RBC: 3.6 MIL/uL — ABNORMAL LOW (ref 3.87–5.11)
RDW: 14.1 % (ref 11.5–15.5)
Smear Review: NORMAL
WBC Count: 6.1 10*3/uL (ref 4.0–10.5)
nRBC: 0 % (ref 0.0–0.2)

## 2022-01-15 LAB — CMP (CANCER CENTER ONLY)
ALT: 17 U/L (ref 0–44)
AST: 21 U/L (ref 15–41)
Albumin: 3.8 g/dL (ref 3.5–5.0)
Alkaline Phosphatase: 86 U/L (ref 38–126)
Anion gap: 9 (ref 5–15)
BUN: 20 mg/dL (ref 8–23)
CO2: 23 mmol/L (ref 22–32)
Calcium: 8.9 mg/dL (ref 8.9–10.3)
Chloride: 102 mmol/L (ref 98–111)
Creatinine: 0.98 mg/dL (ref 0.44–1.00)
GFR, Estimated: 59 mL/min — ABNORMAL LOW (ref >60)
Glucose, Bld: 225 mg/dL — ABNORMAL HIGH (ref 70–99)
Potassium: 4.3 mmol/L (ref 3.5–5.1)
Sodium: 134 mmol/L — ABNORMAL LOW (ref 135–145)
Total Bilirubin: 0.4 mg/dL (ref 0.3–1.2)
Total Protein: 6.9 g/dL (ref 6.5–8.1)

## 2022-01-15 MED ORDER — PALONOSETRON HCL INJECTION 0.25 MG/5ML
0.2500 mg | Freq: Once | INTRAVENOUS | Status: AC
  Administered 2022-01-15: 0.25 mg via INTRAVENOUS
  Filled 2022-01-15: qty 5

## 2022-01-15 MED ORDER — SODIUM CHLORIDE 0.9 % IV SOLN: Freq: Once | INTRAVENOUS | Status: AC

## 2022-01-15 MED ORDER — DIPHENHYDRAMINE HCL 50 MG/ML IJ SOLN
25.0000 mg | Freq: Once | INTRAMUSCULAR | Status: AC
  Administered 2022-01-15: 25 mg via INTRAVENOUS
  Filled 2022-01-15: qty 1

## 2022-01-15 MED ORDER — FAMOTIDINE IN NACL 20-0.9 MG/50ML-% IV SOLN
20.0000 mg | Freq: Once | INTRAVENOUS | Status: AC
  Administered 2022-01-15: 20 mg via INTRAVENOUS
  Filled 2022-01-15: qty 50

## 2022-01-15 MED ORDER — SODIUM CHLORIDE 0.9 % IV SOLN
510.0000 mg | Freq: Once | INTRAVENOUS | Status: AC
  Administered 2022-01-15: 510 mg via INTRAVENOUS
  Filled 2022-01-15: qty 51

## 2022-01-15 MED ORDER — SODIUM CHLORIDE 0.9 % IV SOLN
131.2500 mg/m2 | Freq: Once | INTRAVENOUS | Status: AC
  Administered 2022-01-15: 252 mg via INTRAVENOUS
  Filled 2022-01-15: qty 42

## 2022-01-15 MED ORDER — SODIUM CHLORIDE 0.9 % IV SOLN
150.0000 mg | Freq: Once | INTRAVENOUS | Status: AC
  Administered 2022-01-15: 150 mg via INTRAVENOUS
  Filled 2022-01-15: qty 150

## 2022-01-15 MED ORDER — SODIUM CHLORIDE 0.9 % IV SOLN
10.0000 mg | Freq: Once | INTRAVENOUS | Status: AC
  Administered 2022-01-15: 10 mg via INTRAVENOUS
  Filled 2022-01-15: qty 10

## 2022-01-15 NOTE — Assessment & Plan Note (Signed)
Her blood pressure is high today but at home is within normal range We will proceed with treatment without delay

## 2022-01-15 NOTE — Assessment & Plan Note (Signed)
The Port-A-Cath infection has resolved We will proceed with treatment as scheduled

## 2022-01-15 NOTE — Patient Instructions (Signed)
[facility] MEDICAL [facility]COLOGY  Discharge Instructions: Thank you for choosing [facility] to provide your oncology and hematology care.   If you have a lab appointment with the Cancer Center, please go directly to the Cancer Center and check in at the registration area.   Wear comfortable clothing and clothing appropriate for easy access to any Portacath or PICC line.   We strive to give you quality time with your provider. You may need to reschedule your appointment if you arrive late (15 or more minutes).  Arriving late affects you and other patients whose appointments are after yours.  Also, if you miss three or more appointments without notifying the office, you may be dismissed from the clinic at the provider's discretion.      For prescription refill requests, have your pharmacy contact our office and allow 72 hours for refills to be completed.    Today you received the following chemotherapy and/or immunotherapy agents: Paclitaxel (Taxol) and Carboplatin.   To help prevent nausea and vomiting after your treatment, we encourage you to take your nausea medication as directed.  BELOW ARE SYMPTOMS THAT SHOULD BE REPORTED IMMEDIATELY: *FEVER GREATER THAN 100.4 F (38 C) OR HIGHER *CHILLS OR SWEATING *NAUSEA AND VOMITING THAT IS NOT CONTROLLED WITH YOUR NAUSEA MEDICATION *UNUSUAL SHORTNESS OF BREATH *UNUSUAL BRUISING OR BLEEDING *URINARY PROBLEMS (pain or burning when urinating, or frequent urination) *BOWEL PROBLEMS (unusual diarrhea, constipation, pain near the anus) TENDERNESS IN MOUTH AND THROAT WITH OR WITHOUT PRESENCE OF ULCERS (sore throat, sores in mouth, or a toothache) UNUSUAL RASH, SWELLING OR PAIN  UNUSUAL VAGINAL DISCHARGE OR ITCHING   Items with * indicate a potential emergency and should be followed up as soon as possible or go to the Emergency Department if any problems should occur.  Please show the CHEMOTHERAPY ALERT CARD or IMMUNOTHERAPY  ALERT CARD at check-in to the Emergency Department and triage nurse.  Should you have questions after your visit or need to cancel or reschedule your appointment, please contact [facility]  Dept: [phone]  and follow the prompts.  Office hours are 8:00 a.m. to 4:30 p.m. Monday - Friday. Please note that voicemails left after 4:00 p.m. may not be returned until the following business day.  We are closed weekends and major holidays. You have access to a nurse at all times for urgent questions. Please call the main number to the clinic Dept: [phone] and follow the prompts.   For any non-urgent questions, you may also contact your provider using MyChart. We now offer e-Visits for anyone 18 and older to request care online for non-urgent symptoms. For details visit [email or web address].   Also download the MyChart app! Go to the app store, search "MyChart", open the app, select , and log in with your MyChart username and password.  Due to Covid, a mask is required upon entering the hospital/clinic. If you do not have a mask, one will be given to you upon arrival. For doctor visits, patients may have 1 support person aged 18 or older with them. For treatment visits, patients cannot have anyone with them due to current Covid guidelines and our immunocompromised population.   

## 2022-01-15 NOTE — Assessment & Plan Note (Signed)
Her last dose of chemotherapy will be on March 30 I plan to repeat CT imaging end of April We will notify her breast surgeon, hopefully she can have her surgery after her birthday in [birth year]

## 2022-01-15 NOTE — Progress Notes (Signed)
[place] OFFICE PROGRESS NOTE  Patient Care Team: [name], FNP as PCP - General (Nurse Practitioner) Jacqulyn Liner, RN as Oncology Nurse Navigator (Oncology)  ASSESSMENT & PLAN:  Endometrial cancer [facility]) The Port-A-Cath infection has resolved We will proceed with treatment as scheduled  Breast cancer of upper-outer quadrant of left female breast [facility]) Her last dose of chemotherapy will be on March 30 I plan to repeat CT imaging end of April We will notify her breast surgeon, hopefully she can have her surgery after her birthday in May   Essential hypertension Her blood pressure is high today but at home is within normal range We will proceed with treatment without delay  No orders of the defined types were placed in this encounter.   All questions were answered. The patient knows to call the clinic with any problems, questions or concerns. The total time spent in the appointment was 20 minutes encounter with patients including review of chart and various tests results, discussions about plan of care and coordination of care plan   Heath Lark, MD 01/15/2022 10:46 AM  INTERVAL HISTORY: Please see below for problem oriented charting. she returns for treatment follow-up She is doing well with antibiotics No fever or chills The area of her skin over the Port-A-Cath site is healing well  REVIEW OF SYSTEMS:   Constitutional: Denies fevers, chills or abnormal weight loss Eyes: Denies blurriness of vision Ears, nose, mouth, throat, and face: Denies mucositis or sore throat Respiratory: Denies cough, dyspnea or wheezes Cardiovascular: Denies palpitation, chest discomfort or lower extremity swelling Gastrointestinal:  Denies nausea, heartburn or change in bowel habits Skin: Denies abnormal skin rashes Lymphatics: Denies new lymphadenopathy or easy bruising Neurological:Denies numbness, tingling or new weaknesses Behavioral/Psych: Mood is stable, no new  changes  All other systems were reviewed with the patient and are negative.  I have reviewed the past medical history, past surgical history, social history and family history with the patient and they are unchanged from previous note.  ALLERGIES:  is allergic to lactose, codeine, penicillins, sulfa antibiotics, and vancomycin.  MEDICATIONS:  Current Outpatient Medications  Medication Sig Dispense Refill   Cholecalciferol 50 MCG (2000 UT) CAPS Take 2,000 Units by mouth daily.     doxycycline (VIBRA-TABS) 100 MG tablet Take 1 tablet (100 mg total) by mouth every 12 (twelve) hours. 20 tablet 0   levothyroxine (SYNTHROID) 125 MCG tablet Take 125 mcg by mouth daily before breakfast.     losartan (COZAAR) 50 MG tablet Take 50 mg by mouth daily.     magnesium oxide (MAG-OX) 400 MG tablet Take 200 mg by mouth 2 (two) times daily.     Multiple Vitamins-Minerals (MULTIVITAMIN WOMEN 50+ PO) Take 1 tablet by mouth daily.     Omega-3 1000 MG CAPS Take 1,000 mg by mouth daily.     ondansetron (ZOFRAN) 8 MG tablet Take 1 tablet (8 mg total) by mouth every 8 (eight) hours as needed. (Patient taking differently: Take 8 mg by mouth every 8 (eight) hours as needed for nausea or vomiting.) 30 tablet 1   pantoprazole (PROTONIX) 20 MG tablet Take 20 mg by mouth daily as needed for heartburn or indigestion.     propranolol (INDERAL) 40 MG tablet Take 40 mg by mouth 2 (two) times daily.     rosuvastatin (CRESTOR) 5 MG tablet Take 5 mg by mouth every evening.     Simethicone 125 MG TABS Take 250 mg by mouth 2 (two)  times daily as needed (gas).     No current facility-administered medications for this visit.   Facility-Administered Medications Ordered in Other Visits  Medication Dose Route Frequency Provider Last Rate Last Admin   CARBOplatin (PARAPLATIN) 510 mg in sodium chloride 0.9 % 250 mL chemo infusion  510 mg Intravenous Once Alvy Bimler, Jaclene Bartelt, MD       dexamethasone (DECADRON) 10 mg in sodium chloride 0.9 % 50  mL IVPB  10 mg Intravenous Once Alvy Bimler, Sanjiv Castorena, MD       famotidine (PEPCID) IVPB 20 mg premix  20 mg Intravenous Once Alvy Bimler, Wynston Romey, MD       fosaprepitant (EMEND) 150 mg in sodium chloride 0.9 % 145 mL IVPB  150 mg Intravenous Once Alvy Bimler, Marl Seago, MD       PACLitaxel (TAXOL) 252 mg in sodium chloride 0.9 % 250 mL chemo infusion (> $RemoveBef'80mg'NWNQAncTpZ$ /m2)  131.25 mg/m2 (Treatment Plan Recorded) Intravenous Once Alvy Bimler, Deshondra Worst, MD       sodium chloride flush (NS) 0.9 % injection 10 mL  10 mL Intracatheter Once Heath Lark, MD        SUMMARY OF ONCOLOGIC HISTORY: Oncology History Overview Note  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 Evaluation of the pelvis is significantly limited by dense metallic streak artifact from adjacent hip arthroplasty. Within this limitation, there is expansile, masslike, heterogeneously enhancing appearance of the endometrium, measuring at least 5.2 cm in thickness. Findings are in keeping with reported diagnosis of endometrial malignancy. 2. No evidence of lymphadenopathy or metastatic disease in the chest, abdomen, or pelvis. 3. There is a 0.4 cm fissural nodule the superior segment left lower lobe, almost certainly a benign intrapulmonary lymph node. Attention on follow-up. 4. Coronary artery disease.   10/14/2021 Surgery   TRH/BSO, right SLN biopsy, left pelvic and PA LND, peritoneal nodule biopsy, mini-lap for specimen delivery  Findings: On EUA, 8cm bulbous uterus. ON intra-abdominal entry, normal upper abdominal survey. Normal omentum, small and large bowel. Uterus 8-10cm and bulbous. Normal appearing adnexa. Mapping successful on the right, no mapping on the left. Some mildly prominent lymph nodes bilaterally in the pelvis. Small, <5 mm, nodule in the cul de sac. No  obvious intra-abdominal or pelvic evidence of disease.   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 SENTINEL LYMPH NODE, RIGHT EXTERNAL ILIAC, BIOPSY:  - Lymph node, negative for carcinoma (0/1)   B. SENTINEL LYMPH NODE, RIGHT OBTURATOR AND SURROUNDING LYMPH NODES,  BIOPSY:  - Lymph nodes, negative for carcinoma (0/5)   C. UTERUS, CERVIX, BILATERAL FALLOPIAN TUBES AND OVARIES:  - Invasive high-grade serous carcinoma  - Carcinoma focally invades into the serosal surface  - Lymphovascular invasion is present  - Benign unremarkable cervix  - Benign unremarkable bilateral fallopian tubes and ovaries  - See oncology table   D. CUL DE SAC NODULE, POSTERIOR, BIOPSY:  - Endometriosis   E. LYMPH NODE, LEFT PELVIC, BIOPSY:  - Lymph nodes, negative for carcinoma (0/3)   F. LYMPH NODE, LEFT PARAORTIC, BIOPSY:  - Lymph nodes, negative for  carcinoma (0/5)   ONCOLOGY TABLE:   UTERUS, CARCINOMA OR CARCINOSARCOMA: Resection   Procedure: Total hysterectomy and bilateral salpingo-oophorectomy  Histologic Type: Serous carcinoma  Histologic Grade: High-grade  Myometrial Invasion:       Depth of Myometrial Invasion (mm): 32 mm       Myometrial Thickness (mm): 32 mm       Percentage of Myometrial Invasion: 100%  Uterine Serosa Involvement: Present, focal  Cervical stromal Involvement: Not identified  Extent of involvement of other tissue/organs: Not identified  Peritoneal/Ascitic Fluid: Negative for carcinoma  Lymphovascular Invasion: Present  Regional Lymph Nodes:       Pelvic Lymph Nodes Examined:                                   6 Sentinel                                   3 Non-sentinel                                   9 Total       Pelvic Lymph  Nodes with Metastasis: 0                           Macrometastasis: (>2.0 mm): 0                           Micrometastasis: (>0.2 mm and < 2.0 mm): 0                           Isolated Tumor Cells (<0.2 mm): 0                           Laterality of Lymph Node with Tumor: Not  applicable                           Extracapsular Extension: Not applicable       Para-aortic Lymph Nodes Examined:                                    0 Sentinel                                    5 Non-sentinel                                    5 Total       Para-aortic Lymph Nodes with Metastasis: 0                           Macrometastasis: (>2.0 mm): 0                           Micrometastasis:  (>0.2 mm and < 2.0 mm): 0  Isolated Tumor Cells (<0.2 mm): 0                           Laterality of Lymph Node with Tumor: Not  applicable                           Extracapsular Extension: Not applicable  Distant Metastasis:       Distant Site(s) Involved: Not applicable  Pathologic Stage Classification (pTNM, AJCC 8th Edition): pT3a, pN0  Ancillary Studies: MMR / MSI testing will be ordered  Representative Tumor Block: C4  Comment(s): Pancytokeratin was performed on the lymph nodes and is negative.   By immunohistochemistry, HER-2 is EQUIVOCAL (2+).  HER-2 by FISH is pending and will be reported in an addendum.   FLOURESCENCE IN-SITU HYBRIDIZATION RESULTS:   GROUP 1:  HER2 **POSITIVE**   On the tissue sample received from this individual HER2 FISH was performed by a technologist and cell imaging and analysis on the Bio[employer].   RATIO of HER2/CEN 17 SIGNALS: 2.50  AVERAGE HER2 COPY NUMBER PER CELL: 4.74   The ratio of HER2/CEN 17 result exceeds the cutoff value of >=2.0 and a copy number of HER2 signals exceeding the cutoff range of >=4.0 signals per cell.  Arch Pathol Lab Med 1:1, 2018.    10/28/2021 Cancer Staging   Staging form: Corpus Uteri - Carcinoma and Carcinosarcoma, AJCC  8th Edition - Pathologic stage from 10/28/2021: Stage III (pT3, pN0, cM0) - Signed by Heath Lark, MD on 10/28/2021 Stage prefix: Initial diagnosis    11/07/2021 Procedure   Successful placement of a right internal jugular approach power injectable Port-A-Cath. The catheter is ready for immediate use.   11/11/2021 -  Chemotherapy   Patient is on Treatment Plan : UTERINE Carboplatin AUC 6 / Paclitaxel q21d     Breast cancer of upper-outer quadrant of left female breast ([place])  11/19/2021 Imaging   IMPRESSION: 1. Highly suspicious mass in the left breast at 1 o'clock measuring 1.9 cm.   2.  No mammographic evidence of malignancy in the right breast   11/27/2021 Procedure   Appropriate positioning of the venous shaped biopsy marking clip at the site of biopsy in the 1 o'clock location of the LEFT breast.   12/02/2021 Initial Diagnosis   Breast cancer of upper-outer quadrant of left female breast ([place])   12/02/2021 Cancer Staging   Staging form: Breast, AJCC 8th Edition - Clinical stage from 12/02/2021: cT1c, cN0, cM0 - Signed by Heath Lark, MD on 12/02/2021 Stage prefix: Initial diagnosis      PHYSICAL EXAMINATION: ECOG PERFORMANCE STATUS: 1 - Symptomatic but completely ambulatory  Vitals:   01/15/22 0944  BP: (!) 168/78  Pulse: 71  Resp: 18  Temp: 98.6 F (37 C)  SpO2: 97%   Filed Weights   01/15/22 0944  Weight: 181 lb 9.6 oz (82.4 kg)    GENERAL:alert, no distress and comfortable SKIN: The Port-A-Cath site is healing well NEURO: alert & oriented x 3 with fluent speech, no focal motor/sensory deficits  LABORATORY DATA:  I have reviewed the data as listed    Component Value Date/Time   NA 134 (L) 01/15/2022 0918   K 4.3 01/15/2022 0918   CL 102 01/15/2022 0918   CO2 23 01/15/2022 0918   GLUCOSE 225 (H) 01/15/2022 0918   BUN 20 01/15/2022 0918   CREATININE 0.98 01/15/2022 0918   CALCIUM 8.9 01/15/2022 0960  PROT 6.9 01/15/2022 0918   ALBUMIN 3.8 01/15/2022 0918    AST 21 01/15/2022 0918   ALT 17 01/15/2022 0918   ALKPHOS 86 01/15/2022 0918   BILITOT 0.4 01/15/2022 0918   GFRNONAA 59 (L) 01/15/2022 0918    No results found for: SPEP, UPEP  Lab Results  Component Value Date   WBC 6.1 01/15/2022   NEUTROABS 5.3 01/15/2022   HGB 11.1 (L) 01/15/2022   HCT 32.8 (L) 01/15/2022   MCV 91.1 01/15/2022   PLT 185 01/15/2022      Chemistry      Component Value Date/Time   NA 134 (L) 01/15/2022 0918   K 4.3 01/15/2022 0918   CL 102 01/15/2022 0918   CO2 23 01/15/2022 0918   BUN 20 01/15/2022 0918   CREATININE 0.98 01/15/2022 0918      Component Value Date/Time   CALCIUM 8.9 01/15/2022 0918   ALKPHOS 86 01/15/2022 0918   AST 21 01/15/2022 0918   ALT 17 01/15/2022 0918   BILITOT 0.4 01/15/2022 0737

## 2022-01-16 ENCOUNTER — Telehealth: Payer: Self-pay | Admitting: *Deleted

## 2022-01-16 ENCOUNTER — Encounter: Payer: Self-pay | Admitting: *Deleted

## 2022-01-16 NOTE — Telephone Encounter (Addendum)
Kristin Carlson was contacted by telephone to verify understanding of discharge instructions status post their most recent discharge from the hospital on the date:  01/13/22.  Inpatient discharge AVS was re-reviewed with patient, along with cancer center appointments.  Verification of understanding for oncology specific follow-up was validated using the Teach Back method.  Patient states that she is doing well and is having no issues at this time.  Transportation to appointments were confirmed for the patient as being self/caregiver.  Erick Blinks questions were addressed to their satisfaction upon completion of this post discharge follow-up call for outpatient oncology.

## 2022-01-16 NOTE — Progress Notes (Signed)
Oncology Discharge Planning Note  [facility] at [facility] Address: [address], [place], [place] [address] Hours of Operation:  Nena Polio, Monday - Friday  Clinic Contact Information:  [phone]) [phone]  Oncology Care Team: Medical Oncologist:  Dr. Heath Lark  Patient Details: Name:  Kristin Carlson, Kristin Carlson MRN:   [id number] DOB:   17-Nov-1941 Reason for Current Admission: @PPROB @  Discharge Planning Narrative: Discharge follow-up appointments for oncology are current and available on the AVS and MyChart.   Upon discharge from the hospital, hematology/oncology's post discharge plan of care for the outpatient setting is: current and will be modified if needed.   [name] will be called within two business days after discharge to review hematology/oncology's plan of care for full understanding.    Outpatient Oncology Specific Care Only: Oncology appointment transportation needs addressed?:  not applicable Oncology medication management for symptom management addressed?:  not applicable Chemo Alert Card reviewed?:  not applicable Immunotherapy Alert Card reviewed?:  not applicable

## 2022-01-20 ENCOUNTER — Telehealth: Payer: Self-pay | Admitting: Licensed Clinical Social Worker

## 2022-01-20 ENCOUNTER — Encounter: Payer: Self-pay | Admitting: Licensed Clinical Social Worker

## 2022-01-20 ENCOUNTER — Ambulatory Visit: Payer: Self-pay | Admitting: Licensed Clinical Social Worker

## 2022-01-20 DIAGNOSIS — Z1379 Encounter for other screening for genetic and chromosomal anomalies: Secondary | ICD-10-CM | POA: Insufficient documentation

## 2022-01-20 NOTE — Progress Notes (Signed)
HPI:  Kristin Carlson was previously seen in the [facility] due to a personal history of cancer and concerns regarding a hereditary predisposition to cancer. Please refer to our prior cancer genetics clinic note for more information regarding our discussion, assessment and recommendations, at the time. Kristin Carlson recent genetic test results were disclosed to her, as were recommendations warranted by these results. These results and recommendations are discussed in more detail below.  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 Evaluation of the pelvis is significantly limited by dense metallic streak artifact from adjacent hip arthroplasty. Within this limitation, there is expansile, masslike, heterogeneously enhancing appearance of the endometrium, measuring at least 5.2 cm in thickness. Findings are in keeping with reported diagnosis of endometrial malignancy. 2. No evidence of lymphadenopathy or metastatic disease in the chest, abdomen, or pelvis. 3. There is a 0.4 cm fissural nodule the superior segment left lower lobe, almost certainly a benign intrapulmonary lymph node. Attention on follow-up. 4. Coronary artery disease.   10/14/2021 Surgery   TRH/BSO, right SLN biopsy, left pelvic and PA LND, peritoneal nodule biopsy, mini-lap for specimen delivery  Findings: On EUA, 8cm bulbous uterus. ON intra-abdominal entry, normal upper abdominal survey. Normal omentum, small and large bowel. Uterus 8-10cm and bulbous. Normal appearing adnexa. Mapping successful on the right, no mapping on the left. Some mildly prominent lymph nodes bilaterally in the pelvis. Small, <5 mm, nodule in the cul de sac. No obvious intra-abdominal or  pelvic evidence of disease.   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 SENTINEL LYMPH NODE, RIGHT EXTERNAL ILIAC, BIOPSY:  - Lymph node, negative for carcinoma (0/1)   B. SENTINEL LYMPH NODE, RIGHT OBTURATOR AND SURROUNDING LYMPH NODES,  BIOPSY:  - Lymph nodes, negative for carcinoma (0/5)   C. UTERUS, CERVIX, BILATERAL FALLOPIAN TUBES AND OVARIES:  - Invasive high-grade serous carcinoma  - Carcinoma focally invades into the serosal surface  - Lymphovascular invasion is present  - Benign unremarkable cervix  - Benign unremarkable bilateral fallopian tubes and ovaries  - See oncology table   D. CUL DE SAC NODULE, POSTERIOR, BIOPSY:  - Endometriosis   E. LYMPH NODE, LEFT PELVIC, BIOPSY:  - Lymph nodes, negative for carcinoma (0/3)   F. LYMPH NODE, LEFT PARAORTIC, BIOPSY:  - Lymph nodes, negative for carcinoma (0/5)   ONCOLOGY TABLE:   UTERUS, CARCINOMA OR CARCINOSARCOMA: Resection   Procedure: Total hysterectomy and bilateral salpingo-oophorectomy  Histologic Type: Serous carcinoma  Histologic Grade: High-grade  Myometrial Invasion:       Depth of Myometrial Invasion (mm): 32 mm       Myometrial Thickness (mm): 32 mm       Percentage of Myometrial Invasion: 100%  Uterine Serosa Involvement: Present, focal  Cervical stromal Involvement: Not identified  Extent of involvement of other tissue/organs: Not identified  Peritoneal/Ascitic Fluid: Negative for carcinoma  Lymphovascular Invasion: Present  Regional Lymph Nodes:       Pelvic Lymph Nodes Examined:  6 Sentinel                                   3 Non-sentinel                                   9 Total       Pelvic Lymph Nodes with Metastasis: 0                            Macrometastasis: (>2.0 mm): 0                           Micrometastasis: (>0.2 mm and < 2.0 mm): 0                           Isolated Tumor Cells (<0.2 mm): 0                           Laterality of Lymph Node with Tumor: Not  applicable                           Extracapsular Extension: Not applicable       Para-aortic Lymph Nodes Examined:                                    0 Sentinel                                    5 Non-sentinel                                    5 Total       Para-aortic Lymph Nodes with Metastasis: 0                           Macrometastasis: (>2.0 mm): 0                           Micrometastasis:  (>0.2 mm and < 2.0 mm): 0                           Isolated Tumor Cells (<0.2 mm): 0                           Laterality of Lymph Node with Tumor: Not  applicable                           Extracapsular Extension: Not applicable  Distant Metastasis:       Distant Site(s) Involved: Not applicable  Pathologic Stage Classification (pTNM, AJCC 8th Edition): pT3a, pN0  Ancillary Studies: MMR / MSI testing will be ordered  Representative Tumor Block: C4  Comment(s): Pancytokeratin was performed on the lymph nodes and is negative.   By immunohistochemistry, HER-2 is  EQUIVOCAL (2+).  HER-2 by FISH is pending and will be reported in an addendum.   FLOURESCENCE IN-SITU HYBRIDIZATION RESULTS:   GROUP 1:  HER2 **POSITIVE**   On the tissue sample received from this individual HER2 FISH was performed by a technologist and cell imaging and analysis on the [employer].   RATIO of HER2/CEN 17 SIGNALS: 2.50  AVERAGE HER2 COPY NUMBER PER CELL: 4.74   The ratio of HER2/CEN 17 result exceeds the cutoff value of >=2.0 and a copy number of HER2 signals exceeding the cutoff range of >=4.0 signals per cell.  Arch Pathol Lab Med 1:1, 2018.    10/28/2021 Cancer Staging   Staging form: Corpus Uteri - Carcinoma and Carcinosarcoma, AJCC 8th Edition - Pathologic  stage from 10/28/2021: Stage III (pT3, pN0, cM0) - Signed by Heath Lark, MD on 10/28/2021 Stage prefix: Initial diagnosis    11/07/2021 Procedure   Successful placement of a right internal jugular approach power injectable Port-A-Cath. The catheter is ready for immediate use.   11/11/2021 -  Chemotherapy   Patient is on Treatment Plan : UTERINE Carboplatin AUC 6 / Paclitaxel q21d      Genetic Testing   Negative genetic testing. No pathogenic variants identified on the [facility] Multi-Cancer+RNA panel. VUS in MET called c.3220A>G, VUS in MUTYh called c.1417G>A and VUS in POLE called c.596G>T identified. The report date is 01/20/2022.  The Multi-Cancer Panel + RNA offered by [facility] includes sequencing and/or deletion duplication testing of the following 84 genes: AIP, ALK, APC, ATM, AXIN2,BAP1,  BARD1, BLM, BMPR1A, BRCA1, BRCA2, BRIP1, CASR, CDC73, CDH1, CDK4, CDKN1B, CDKN1C, CDKN2A (p14ARF), CDKN2A (p16INK4a), CEBPA, CHEK2, CTNNA1, DICER1, DIS3L2, EGFR (c.2369C>T, p.Thr790Met variant only), EPCAM (Deletion/duplication testing only), FH, FLCN, GATA2, GPC3, GREM1 (Promoter region deletion/duplication testing only), HOXB13 (c.251G>A, p.Gly84Glu), HRAS, KIT, MAX, MEN1, MET, MITF (c.952G>A, p.Glu318Lys variant only), MLH1, MSH2, MSH3, MSH6, MUTYH, NBN, NF1, NF2, NTHL1, PALB2, PDGFRA, PHOX2B, PMS2, POLD1, POLE, POT1, PRKAR1A, PTCH1, PTEN, RAD50, RAD51C, RAD51D, RB1, RECQL4, RET, RUNX1, SDHAF2, SDHA (sequence changes only), SDHB, SDHC, SDHD, SMAD4, SMARCA4, SMARCB1, SMARCE1, STK11, SUFU, TERC, TERT, TMEM127, TP53, TSC1, TSC2, VHL, WRN and WT1.   Breast cancer of upper-outer quadrant of left female breast ([place])  11/19/2021 Imaging   IMPRESSION: 1. Highly suspicious mass in the left breast at 1 o'clock measuring 1.9 cm.   2.  No mammographic evidence of malignancy in the right breast   11/27/2021 Procedure   Appropriate positioning of the venous shaped biopsy marking clip at the site of biopsy in the 1  o'clock location of the LEFT breast.   12/02/2021 Initial Diagnosis   Breast cancer of upper-outer quadrant of left female breast ([place])   12/02/2021 Cancer Staging   Staging form: Breast, AJCC 8th Edition - Clinical stage from 12/02/2021: cT1c, cN0, cM0 - Signed by Heath Lark, MD on 12/02/2021 Stage prefix: Initial diagnosis     Genetic Testing   Negative genetic testing. No pathogenic variants identified on the [facility] Multi-Cancer+RNA panel. VUS in MET called c.3220A>G, VUS in MUTYh called c.1417G>A and VUS in POLE called c.596G>T identified. The report date is 01/20/2022.  The Multi-Cancer Panel + RNA offered by [facility] includes sequencing and/or deletion duplication testing of the following 84 genes: AIP, ALK, APC, ATM, AXIN2,BAP1,  BARD1, BLM, BMPR1A, BRCA1, BRCA2, BRIP1, CASR, CDC73, CDH1, CDK4, CDKN1B, CDKN1C, CDKN2A (p14ARF), CDKN2A (p16INK4a), CEBPA, CHEK2, CTNNA1, DICER1, DIS3L2, EGFR (c.2369C>T, p.Thr790Met variant only), EPCAM (Deletion/duplication testing only), FH, FLCN, GATA2, GPC3, GREM1 (Promoter region deletion/duplication testing only), HOXB13 (  c.251G>A, p.Gly84Glu), HRAS, KIT, MAX, MEN1, MET, MITF (c.952G>A, p.Glu318Lys variant only), MLH1, MSH2, MSH3, MSH6, MUTYH, NBN, NF1, NF2, NTHL1, PALB2, PDGFRA, PHOX2B, PMS2, POLD1, POLE, POT1, PRKAR1A, PTCH1, PTEN, RAD50, RAD51C, RAD51D, RB1, RECQL4, RET, RUNX1, SDHAF2, SDHA (sequence changes only), SDHB, SDHC, SDHD, SMAD4, SMARCA4, SMARCB1, SMARCE1, STK11, SUFU, TERC, TERT, TMEM127, TP53, TSC1, TSC2, VHL, WRN and WT1.     FAMILY HISTORY:  We obtained a detailed, 4-generation family history.  Significant diagnoses are listed below: Family History  Problem Relation Age of Onset   Cancer Father        Lung Cancer   Cancer Paternal Aunt        mouth   Skin cancer Maternal Grandmother    Skin cancer Paternal Grandmother    Colon cancer Neg Hx    Breast cancer Neg Hx    Ovarian cancer Neg Hx    Endometrial cancer Neg Hx    Pancreatic  cancer Neg Hx    Prostate cancer Neg Hx      Kristin Carlson has 1 son, 75. His wife reportedly is BRCA positive. Kristin Carlson has 1 daughter, 83, no history of cancer. Patient has 1 brother, 79, no history of cancer aside from skin cancers.   Kristin Carlson mother died at 77. Patient had 3 maternal uncles. Grandmother died 10-Feb-1975, grandfather died over age 29. No known cancers on this side of the family.   Kristin Carlson father died of lung cancer at 85 and had history of smoking. Patient had 1 paternal aunt, she had mouth cancer, and 2 paternal uncles, no cancers. Paternal grandmother died in her 72s. Grandfather died in his 69s.    Kristin Carlson is unaware of previous family history of genetic testing for hereditary cancer risks. Patient's maternal ancestors are of [place] descent, and paternal ancestors are of English/ [place] descent. There is no reported Ashkenazi Jewish ancestry. There is no known consanguinity.    GENETIC TEST RESULTS: Genetic testing reported out on 01/20/2022 through the [facility] Multi-Cancer+RNA cancer panel found no pathogenic mutations.   The Multi-Cancer Panel + RNA offered by [facility] includes sequencing and/or deletion duplication testing of the following 84 genes: AIP, ALK, APC, ATM, AXIN2,BAP1,  BARD1, BLM, BMPR1A, BRCA1, BRCA2, BRIP1, CASR, CDC73, CDH1, CDK4, CDKN1B, CDKN1C, CDKN2A (p14ARF), CDKN2A (p16INK4a), CEBPA, CHEK2, CTNNA1, DICER1, DIS3L2, EGFR (c.2369C>T, p.Thr790Met variant only), EPCAM (Deletion/duplication testing only), FH, FLCN, GATA2, GPC3, GREM1 (Promoter region deletion/duplication testing only), HOXB13 (c.251G>A, p.Gly84Glu), HRAS, KIT, MAX, MEN1, MET, MITF (c.952G>A, p.Glu318Lys variant only), MLH1, MSH2, MSH3, MSH6, MUTYH, NBN, NF1, NF2, NTHL1, PALB2, PDGFRA, PHOX2B, PMS2, POLD1, POLE, POT1, PRKAR1A, PTCH1, PTEN, RAD50, RAD51C, RAD51D, RB1, RECQL4, RET, RUNX1, SDHAF2, SDHA (sequence changes only), SDHB, SDHC, SDHD, SMAD4, SMARCA4, SMARCB1, SMARCE1,  STK11, SUFU, TERC, TERT, TMEM127, TP53, TSC1, TSC2, VHL, WRN and WT1.   The test report has been scanned into EPIC and is located under the Molecular Pathology section of the Results Review tab.  A portion of the result report is included below for reference.     We discussed that because current genetic testing is not perfect, it is possible there may be a gene mutation in one of these genes that current testing cannot detect, but that chance is small.  There could be another gene that has not yet been discovered, or that we have not yet tested, that is responsible for the cancer diagnoses in the family. It is also possible there is a hereditary cause for the cancer in the  family that Kristin Carlson did not inherit and therefore was not identified in her testing.  Therefore, it is important to remain in touch with cancer genetics in the future so that we can continue to offer Kristin Carlson the most up to date genetic testing.   Genetic testing did identify 3 Variants of uncertain significance (VUS) - one in the MET gene called c.3220A>G, a second in the MUTYH gene called c.1714G>A, and a third in the POLE gene called c.596G>T.  At this time, it is unknown if these variants are associated with increased cancer risk or if they are normal findings, but most variants such as these get reclassified to being inconsequential. They should not be used to make medical management decisions. With time, we suspect the lab will determine the significance of these variants, if any. If we do learn more about them, we will try to contact Kristin Carlson to discuss it further. However, it is important to stay in touch with [place] periodically and keep the address and phone number up to date.  ADDITIONAL GENETIC TESTING: We discussed with Kristin Carlson that her genetic testing was fairly extensive.  If there are genes identified to increase cancer risk that can be analyzed in the future, we would be happy to discuss and coordinate this testing at  that time.    CANCER SCREENING RECOMMENDATIONS: Kristin Carlson test result is considered negative (normal).  This means that we have not identified a hereditary cause for her  personal and family history of cancer at this time. Most cancers happen by chance and this negative test suggests that her cancer may fall into this category.    While reassuring, this does not definitively rule out a hereditary predisposition to cancer. It is still possible that there could be genetic mutations that are undetectable by current technology. There could be genetic mutations in genes that have not been tested or identified to increase cancer risk.  Therefore, it is recommended she continue to follow the cancer management and screening guidelines provided by her oncology and primary healthcare provider.   An individual's cancer risk and medical management are not determined by genetic test results alone. Overall cancer risk assessment incorporates additional factors, including personal medical history, family history, and any available genetic information that may result in a personalized plan for cancer prevention and surveillance.  [For affected/unaffected do not believe] Given Kristin Carlson's personal and family histories, we must interpret these negative results with some caution.  Families with features suggestive of hereditary risk for cancer tend to have multiple family members with cancer, diagnoses in multiple generations and diagnoses before the age of 45. Kristin Carlson family exhibits some of these features. Thus, this result may simply reflect our current inability to detect all mutations within these genes or there may be a different gene that has not yet been discovered or tested.   RECOMMENDATIONS FOR FAMILY MEMBERS:  Relatives in this family might be at some increased risk of developing cancer, over the general population risk, simply due to the family history of cancer.  We recommended female relatives in this  family have a yearly mammogram beginning at age 80, or 74 years younger than the earliest onset of cancer, an annual clinical breast exam, and perform monthly breast self-exams. Female relatives in this family should also have a gynecological exam as recommended by their primary provider.  All family members should be referred for colonoscopy starting at age 36.   FOLLOW-UP: Lastly, we discussed with Ms.  Carlson that cancer genetics is a rapidly advancing field and it is possible that new genetic tests will be appropriate for her and/or her family members in the future. We encouraged her to remain in contact with cancer genetics on an annual basis so we can update her personal and family histories and let her know of advances in cancer genetics that may benefit this family.   Our contact number was provided. Kristin Carlson questions were answered to her satisfaction, and she knows she is welcome to call us at anytime with additional questions or concerns.   Faith Rogue, MS, [facility] Genetic Counselor [email or web address] Phone: [phone]

## 2022-01-20 NOTE — Telephone Encounter (Signed)
Revealed negative genetic testing.  Revealed that a VUS in MET, MUTYH, POLE was identified. This normal result is reassuring and indicates that it is unlikely Kristin Carlson's cancer is due to a hereditary cause.  It is unlikely that there is an increased risk of another cancer due to a mutation in one of these genes.  However, genetic testing is not perfect, and cannot definitively rule out a hereditary cause.  It will be important for her to keep in contact with genetics to learn if any additional testing may be needed in the future.

## 2022-01-21 ENCOUNTER — Encounter: Payer: Self-pay | Admitting: Hematology and Oncology

## 2022-01-22 ENCOUNTER — Other Ambulatory Visit: Payer: Self-pay | Admitting: Gynecologic Oncology

## 2022-01-22 DIAGNOSIS — C541 Malignant neoplasm of endometrium: Secondary | ICD-10-CM

## 2022-01-23 ENCOUNTER — Encounter: Payer: Self-pay | Admitting: Hematology and Oncology

## 2022-01-23 NOTE — Progress Notes (Signed)
Patient called regarding Pharmacologist. Provided income guidelines. She states she will take a look at her income and will call me back if she needs to.  She has my card for any additional financial questions or concerns.

## 2022-01-30 ENCOUNTER — Encounter: Payer: Self-pay | Admitting: Internal Medicine

## 2022-01-30 ENCOUNTER — Ambulatory Visit (INDEPENDENT_AMBULATORY_CARE_PROVIDER_SITE_OTHER): Payer: Medicare Other | Admitting: Internal Medicine

## 2022-01-30 ENCOUNTER — Other Ambulatory Visit: Payer: Self-pay

## 2022-01-30 VITALS — BP 161/78 | HR 65 | Temp 98.2°F | Wt 185.0 lb

## 2022-01-30 DIAGNOSIS — T80212A Local infection due to central venous catheter, initial encounter: Secondary | ICD-10-CM | POA: Diagnosis not present

## 2022-01-30 DIAGNOSIS — T80219A Unspecified infection due to central venous catheter, initial encounter: Secondary | ICD-10-CM

## 2022-01-30 NOTE — Progress Notes (Signed)
? ?  ?Patient: Kristin Carlson  ?DOB: 1941/03/24 ?MRN: [id number] ?PCP: [name], FNP  ? ? ?Chief Complaint  ?Patient presents with  ? Hospitalization Follow-up  ?  ? ?Patient Active Problem List  ? Diagnosis Date Noted  ? Genetic testing 01/20/2022  ? Family history of lung cancer 01/05/2022  ? Infected venous access port, initial encounter 01/05/2022  ? Pancytopenia, acquired ([place]) 01/05/2022  ? 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 Other skin changes 01/01/2022  ? Congestion of upper airway 01/01/2022  ? 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 Breast cancer of upper-outer quadrant of left female breast ([place]) 12/02/2021  ? Essential hypertension   ? Endometrial cancer ([place]) 09/22/2021  ? Basal cell carcinoma 06/04/2021  ?  ? ?Subjective:  ?Kristin Carlson is a 81 y.o. F with PMHx(including endometrial Cx.) as below presents for hospital follow-up of port site infection. She was admitted to [facility] 2/6-2/9 with port site infection.  Left chest port initially place on 11/07/21. On 12/23, she received doxy+ keflex(x7d) for port site infection, followed by doxy x 10d. She had interim improvement of port site erythema.  On 2/2 started on doxy x 10d for continued redness/drainage at port site. She continued to have drainage from her port on doxy as such admitted for further evaluation and IV antibiotics(received vanomcyin and cefepime). Pt reports port has been accessed once in December for infusion. Then accessed again for labs. Underwent port removal on 2/7, minimal infected material noted in pocket, no Cx obtained. She was transition to doxy+ cipro to complete 2 weeks of antibiotics form port removal ?Today: she reports feeling well. The port site is healing well. She reported  adherence to her antibiotics which completed on 2/20. PIV used for infusions ? ?Review of Systems  ?All other systems reviewed and are negative. ? Micro: ?Antibiotics: ?Vancomycin a2/6-2/8 ?meropenem 2/6-2/7 ?Cefepime  2/7-2/8 ?Doxy+ cipro 2/8-2/20 ?Past Medical History:  ?Diagnosis Date  ? Actinic keratosis 06/04/2021  ? right anteromedial thigh  ? Arthritis   ? Basal cell carcinoma 06/04/2021  ? right suprapubic, EDC 07/29/2021  ? Basal cell carcinoma 07/29/2021  ? left nasal ala. atypical basaloid cells  ? Family history of lung cancer   ? GERD (gastroesophageal reflux disease)   ? History of basal cell carcinoma 07/29/2021  ? left thigh, EDC at time of bx  ? HLD (hyperlipidemia)   ? Hypertension   ? Hypothyroidism   ? Pneumonia   ? HX OF YEARS AGO  ? PONV (postoperative nausea and vomiting)   ? ? ?Outpatient Medications Prior to Visit  ?Medication Sig Dispense Refill  ? Cholecalciferol 50 MCG (2000 UT) CAPS Take 2,000 Units by mouth daily.    ? levothyroxine (SYNTHROID) 125 MCG tablet Take 125 mcg by mouth daily before breakfast.    ? losartan (COZAAR) 50 MG tablet Take 50 mg by mouth daily.    ? magnesium oxide (MAG-OX) 400 MG tablet Take 200 mg by mouth 2 (two) times daily.    ? Multiple Vitamins-Minerals (MULTIVITAMIN WOMEN 50+ PO) Take 1 tablet by mouth daily.    ? Omega-3 1000 MG CAPS Take 1,000 mg by mouth daily.    ? ondansetron (ZOFRAN) 8 MG tablet Take 1 tablet (8 mg total) by mouth every 8 (eight) hours as needed. (Patient taking differently: Take 8 mg by mouth every 8 (eight) hours as needed for nausea or vomiting.) 30 tablet 1  ? pantoprazole (PROTONIX) 20 MG tablet Take 20 mg  by mouth daily as needed for heartburn or indigestion.    ? propranolol (INDERAL) 40 MG tablet Take 40 mg by mouth 2 (two) times daily.    ? rosuvastatin (CRESTOR) 5 MG tablet Take 5 mg by mouth every evening.    ? Simethicone 125 MG TABS Take 250 mg by mouth 2 (two) times daily as needed (gas).    ? doxycycline (VIBRA-TABS) 100 MG tablet Take 1 tablet (100 mg total) by mouth every 12 (twelve) hours. (Patient not taking: Reported on 01/30/2022) 20 tablet 0  ? ?Facility-Administered Medications Prior to Visit  ?Medication Dose Route Frequency  Provider Last Rate Last Admin  ? sodium chloride flush (NS) 0.9 % injection 10 mL  10 mL Intracatheter Once Heath Lark, MD      ?  ? ?Allergies  ?Allergen Reactions  ? Lactose Other (See Comments)  ?  Gi- Upset  ? Codeine Nausea And Vomiting  ? Penicillins Rash  ? Sulfa Antibiotics Rash  ? Vancomycin Itching and Rash  ? ? ?Social History  ? ?Tobacco Use  ? Smoking status: Former  ?  Types: Cigarettes  ? Smokeless tobacco: Never  ?Vaping Use  ? Vaping Use: Never used  ?Substance Use Topics  ? Alcohol use: Yes  ?  Comment: RARE  ? Drug use: Never  ? ? ?Family History  ?Problem Relation Age of Onset  ? Cancer Father   ?     Lung Cancer  ? Cancer Paternal Aunt   ?     mouth  ? Skin cancer Maternal Grandmother   ? Skin cancer Paternal Grandmother   ? Colon cancer Neg Hx   ? Breast cancer Neg Hx   ? Ovarian cancer Neg Hx   ? Endometrial cancer Neg Hx   ? Pancreatic cancer Neg Hx   ? Prostate cancer Neg Hx   ? ? ?Objective:  ? ?Vitals:  ? 01/30/22 0857  ?BP: (!) 161/78  ?Pulse: 65  ?Temp: 98.2 ?F (36.8 ?C)  ?TempSrc: Oral  ?Weight: 185 lb (83.9 kg)  ? ?Body mass index is 32.77 kg/m?. ? ?Physical Exam ?Constitutional:   ?   Appearance: Normal appearance.  ?HENT:  ?   Head: Normocephalic and atraumatic.  ?   Right Ear: Tympanic membrane normal.  ?   Left Ear: Tympanic membrane normal.  ?   Nose: Nose normal.  ?   Mouth/Throat:  ?   Mouth: Mucous membranes are moist.  ?Eyes:  ?   Extraocular Movements: Extraocular movements intact.  ?   Conjunctiva/sclera: Conjunctivae normal.  ?   Pupils: Pupils are equal, round, and reactive to light.  ?Cardiovascular:  ?   Rate and Rhythm: Normal rate and regular rhythm.  ?   Heart sounds: No murmur heard. ?  No friction rub. No gallop.  ?Pulmonary:  ?   Effort: Pulmonary effort is normal.  ?   Breath sounds: Normal breath sounds.  ?Abdominal:  ?   General: Abdomen is flat.  ?   Palpations: Abdomen is soft.  ?Musculoskeletal:     ?   General: Normal range of motion.  ?Skin: ?   General:  Skin is warm and dry.  ?   Comments: Left chest wound  ?Neurological:  ?   General: No focal deficit present.  ?   Mental Status: She is alert and oriented to person, place, and time.  ?Psychiatric:     ?   Mood and Affect: Mood normal.  ? ? ?Lab Results: ?  Lab Results  ?Component Value Date  ? WBC 6.1 01/15/2022  ? HGB 11.1 (L) 01/15/2022  ? HCT 32.8 (L) 01/15/2022  ? MCV 91.1 01/15/2022  ? PLT 185 01/15/2022  ?  ?Lab Results  ?Component Value Date  ? CREATININE 0.98 01/15/2022  ? BUN 20 01/15/2022  ? NA 134 (L) 01/15/2022  ? K 4.3 01/15/2022  ? CL 102 01/15/2022  ? CO2 23 01/15/2022  ?  ?Lab Results  ?Component Value Date  ? ALT 17 01/15/2022  ? AST 21 01/15/2022  ? ALKPHOS 86 01/15/2022  ? BILITOT 0.4 01/15/2022  ?  ? ?Assessment & Plan:  ?#Central line infection ?-SP 2 weeks of antibiotics completed  with doxy and cipro EOT 01/19/22 from port removal ?-Wound is scabbed over, using PIV for therapy ?-F/U 3 month ? ?Laurice Record, MD ?[facility] ?[facility] ? ? ?01/30/22  ?9:01 AM  ?

## 2022-02-04 MED FILL — Dexamethasone Sodium Phosphate Inj 100 MG/10ML: INTRAMUSCULAR | Qty: 1 | Status: AC

## 2022-02-04 MED FILL — Fosaprepitant Dimeglumine For IV Infusion 150 MG (Base Eq): INTRAVENOUS | Qty: 5 | Status: AC

## 2022-02-05 ENCOUNTER — Other Ambulatory Visit: Payer: TRICARE For Life (TFL)

## 2022-02-05 ENCOUNTER — Inpatient Hospital Stay: Payer: Medicare Other | Attending: Gynecologic Oncology | Admitting: Hematology and Oncology

## 2022-02-05 ENCOUNTER — Encounter: Payer: Self-pay | Admitting: Hematology and Oncology

## 2022-02-05 ENCOUNTER — Other Ambulatory Visit: Payer: Self-pay

## 2022-02-05 ENCOUNTER — Inpatient Hospital Stay: Payer: Medicare Other

## 2022-02-05 DIAGNOSIS — G620 Drug-induced polyneuropathy: Secondary | ICD-10-CM | POA: Insufficient documentation

## 2022-02-05 DIAGNOSIS — Z5111 Encounter for antineoplastic chemotherapy: Secondary | ICD-10-CM | POA: Diagnosis present

## 2022-02-05 DIAGNOSIS — T451X5A Adverse effect of antineoplastic and immunosuppressive drugs, initial encounter: Secondary | ICD-10-CM | POA: Diagnosis not present

## 2022-02-05 DIAGNOSIS — C541 Malignant neoplasm of endometrium: Secondary | ICD-10-CM | POA: Insufficient documentation

## 2022-02-05 DIAGNOSIS — I10 Essential (primary) hypertension: Secondary | ICD-10-CM | POA: Diagnosis not present

## 2022-02-05 DIAGNOSIS — C50412 Malignant neoplasm of upper-outer quadrant of left female breast: Secondary | ICD-10-CM | POA: Diagnosis present

## 2022-02-05 LAB — CMP (CANCER CENTER ONLY)
ALT: 15 U/L (ref 0–44)
AST: 23 U/L (ref 15–41)
Albumin: 4.1 g/dL (ref 3.5–5.0)
Alkaline Phosphatase: 83 U/L (ref 38–126)
Anion gap: 9 (ref 5–15)
BUN: 21 mg/dL (ref 8–23)
CO2: 21 mmol/L — ABNORMAL LOW (ref 22–32)
Calcium: 9.4 mg/dL (ref 8.9–10.3)
Chloride: 104 mmol/L (ref 98–111)
Creatinine: 0.93 mg/dL (ref 0.44–1.00)
GFR, Estimated: >60 mL/min (ref >60)
Glucose, Bld: 177 mg/dL — ABNORMAL HIGH (ref 70–99)
Potassium: 4.2 mmol/L (ref 3.5–5.1)
Sodium: 134 mmol/L — ABNORMAL LOW (ref 135–145)
Total Bilirubin: 0.4 mg/dL (ref 0.3–1.2)
Total Protein: 7.3 g/dL (ref 6.5–8.1)

## 2022-02-05 LAB — CBC WITH DIFFERENTIAL (CANCER CENTER ONLY)
Abs Immature Granulocytes: 0.03 10*3/uL (ref 0.00–0.07)
Basophils Absolute: 0 10*3/uL (ref 0.0–0.1)
Basophils Relative: 0 %
Eosinophils Absolute: 0 10*3/uL (ref 0.0–0.5)
Eosinophils Relative: 0 %
HCT: 35.3 % — ABNORMAL LOW (ref 36.0–46.0)
Hemoglobin: 11.7 g/dL — ABNORMAL LOW (ref 12.0–15.0)
Immature Granulocytes: 1 %
Lymphocytes Relative: 17 %
Lymphs Abs: 0.8 10*3/uL (ref 0.7–4.0)
MCH: 30.6 pg (ref 26.0–34.0)
MCHC: 33.1 g/dL (ref 30.0–36.0)
MCV: 92.4 fL (ref 80.0–100.0)
Monocytes Absolute: 0.1 10*3/uL (ref 0.1–1.0)
Monocytes Relative: 1 %
Neutro Abs: 4 10*3/uL (ref 1.7–7.7)
Neutrophils Relative %: 81 %
Platelet Count: 158 10*3/uL (ref 150–400)
RBC: 3.82 MIL/uL — ABNORMAL LOW (ref 3.87–5.11)
RDW: 14.7 % (ref 11.5–15.5)
WBC Count: 4.9 10*3/uL (ref 4.0–10.5)
nRBC: 0 % (ref 0.0–0.2)

## 2022-02-05 MED ORDER — DIPHENHYDRAMINE HCL 50 MG/ML IJ SOLN
25.0000 mg | Freq: Once | INTRAMUSCULAR | Status: AC
  Administered 2022-02-05: 11:00:00 25 mg via INTRAVENOUS
  Filled 2022-02-05: qty 1

## 2022-02-05 MED ORDER — SODIUM CHLORIDE 0.9 % IV SOLN
131.2500 mg/m2 | Freq: Once | INTRAVENOUS | Status: AC
  Administered 2022-02-05: 13:00:00 252 mg via INTRAVENOUS
  Filled 2022-02-05: qty 42

## 2022-02-05 MED ORDER — SENNOSIDES-DOCUSATE SODIUM 8.6-50 MG PO TABS: 2.0000 | ORAL_TABLET | Freq: Two times a day (BID) | ORAL | 0 refills | Status: DC

## 2022-02-05 MED ORDER — SODIUM CHLORIDE 0.9 % IV SOLN
150.0000 mg | Freq: Once | INTRAVENOUS | Status: AC
  Administered 2022-02-05: 11:00:00 150 mg via INTRAVENOUS
  Filled 2022-02-05: qty 150

## 2022-02-05 MED ORDER — SODIUM CHLORIDE 0.9 % IV SOLN
510.0000 mg | Freq: Once | INTRAVENOUS | Status: AC
  Administered 2022-02-05: 16:00:00 510 mg via INTRAVENOUS
  Filled 2022-02-05: qty 51

## 2022-02-05 MED ORDER — FAMOTIDINE IN NACL 20-0.9 MG/50ML-% IV SOLN
20.0000 mg | Freq: Once | INTRAVENOUS | Status: AC
  Administered 2022-02-05: 11:00:00 20 mg via INTRAVENOUS
  Filled 2022-02-05: qty 50

## 2022-02-05 MED ORDER — SODIUM CHLORIDE 0.9 % IV SOLN
10.0000 mg | Freq: Once | INTRAVENOUS | Status: AC
  Administered 2022-02-05: 11:00:00 10 mg via INTRAVENOUS
  Filled 2022-02-05: qty 10

## 2022-02-05 MED ORDER — SODIUM CHLORIDE 0.9 % IV SOLN: Freq: Once | INTRAVENOUS | Status: AC

## 2022-02-05 MED ORDER — PALONOSETRON HCL INJECTION 0.25 MG/5ML
0.2500 mg | Freq: Once | INTRAVENOUS | Status: AC
  Administered 2022-02-05: 11:00:00 0.25 mg via INTRAVENOUS
  Filled 2022-02-05: qty 5

## 2022-02-05 NOTE — Assessment & Plan Note (Signed)
she has mild peripheral neuropathy, likely related to side effects of treatment. ?It is not worse since last time I saw her ?We will continue similar dose as before ?

## 2022-02-05 NOTE — Assessment & Plan Note (Signed)
She had no recent recurrent infection since last time I saw her ?We will proceed with chemotherapy as scheduled ?

## 2022-02-05 NOTE — Assessment & Plan Note (Signed)
Her blood pressure is high today but at home is within normal range ?We will proceed with treatment without delay ?

## 2022-02-05 NOTE — Patient Instructions (Signed)
[facility]  Discharge Instructions: Thank you for choosing [facility] to provide your oncology and hematology care.   If you have a lab appointment with the Cancer Center, please go directly to the Cancer Center and check in at the registration area.   Wear comfortable clothing and clothing appropriate for easy access to any Portacath or PICC line.   We strive to give you quality time with your provider. You may need to reschedule your appointment if you arrive late (15 or more minutes).  Arriving late affects you and other patients whose appointments are after yours.  Also, if you miss three or more appointments without notifying the office, you may be dismissed from the clinic at the provider's discretion.      For prescription refill requests, have your pharmacy contact our office and allow 72 hours for refills to be completed.    Today you received the following chemotherapy and/or immunotherapy agents : Taxol, Carboplatin   To help prevent nausea and vomiting after your treatment, we encourage you to take your nausea medication as directed.  BELOW ARE SYMPTOMS THAT SHOULD BE REPORTED IMMEDIATELY: *FEVER GREATER THAN 100.4 F (38 C) OR HIGHER *CHILLS OR SWEATING *NAUSEA AND VOMITING THAT IS NOT CONTROLLED WITH YOUR NAUSEA MEDICATION *UNUSUAL SHORTNESS OF BREATH *UNUSUAL BRUISING OR BLEEDING *URINARY PROBLEMS (pain or burning when urinating, or frequent urination) *BOWEL PROBLEMS (unusual diarrhea, constipation, pain near the anus) TENDERNESS IN MOUTH AND THROAT WITH OR WITHOUT PRESENCE OF ULCERS (sore throat, sores in mouth, or a toothache) UNUSUAL RASH, SWELLING OR PAIN  UNUSUAL VAGINAL DISCHARGE OR ITCHING   Items with * indicate a potential emergency and should be followed up as soon as possible or go to the Emergency Department if any problems should occur.  Please show the CHEMOTHERAPY ALERT CARD or IMMUNOTHERAPY ALERT CARD at  check-in to the Emergency Department and triage nurse.  Should you have questions after your visit or need to cancel or reschedule your appointment, please contact [facility]  Dept: [phone]  and follow the prompts.  Office hours are 8:00 a.m. to 4:30 p.m. Monday - Friday. Please note that voicemails left after 4:00 p.m. may not be returned until the following business day.  We are closed weekends and major holidays. You have access to a nurse at all times for urgent questions. Please call the main number to the clinic Dept: [phone] and follow the prompts.   For any non-urgent questions, you may also contact your provider using MyChart. We now offer e-Visits for anyone 18 and older to request care online for non-urgent symptoms. For details visit [email or web address].   Also download the MyChart app! Go to the app store, search "MyChart", open the app, select [facility], and log in with your MyChart username and password.  Due to Covid, a mask is required upon entering the hospital/clinic. If you do not have a mask, one will be given to you upon arrival. For doctor visits, patients may have 1 support person aged 18 or older with them. For treatment visits, patients cannot have anyone with them due to current Covid guidelines and our immunocompromised population.   

## 2022-02-05 NOTE — Progress Notes (Signed)
[place] OFFICE PROGRESS NOTE  Patient Care Team: [name], FNP as PCP - General (Nurse Practitioner) Jacqulyn Liner, RN as Oncology Nurse Navigator (Oncology)  ASSESSMENT & PLAN:  Endometrial cancer [facility]) She had no recent recurrent infection since last time I saw her We will proceed with chemotherapy as scheduled  Essential hypertension Her blood pressure is high today but at home is within normal range We will proceed with treatment without delay  Peripheral neuropathy due to chemotherapy [facility]) she has mild peripheral neuropathy, likely related to side effects of treatment. It is not worse since last time I saw her We will continue similar dose as before  No orders of the defined types were placed in this encounter.   All questions were answered. The patient knows to call the clinic with any problems, questions or concerns. The total time spent in the appointment was 20 minutes encounter with patients including review of chart and various tests results, discussions about plan of care and coordination of care plan   Heath Lark, MD 02/05/2022 9:55 AM  INTERVAL HISTORY: Please see below for problem oriented charting. she returns for treatment follow-up seen prior to chemotherapy She has persistent mild neuropathy in the feet but not worse She had mild constipation that resolved with laxatives No recent fever, chills or recurrent infection  REVIEW OF SYSTEMS:   Constitutional: Denies fevers, chills or abnormal weight loss Eyes: Denies blurriness of vision Ears, nose, mouth, throat, and face: Denies mucositis or sore throat Respiratory: Denies cough, dyspnea or wheezes Cardiovascular: Denies palpitation, chest discomfort or lower extremity swelling Skin: Denies abnormal skin rashes Lymphatics: Denies new lymphadenopathy or easy bruising Behavioral/Psych: Mood is stable, no new changes  All other systems were reviewed with the patient and are  negative.  I have reviewed the past medical history, past surgical history, social history and family history with the patient and they are unchanged from previous note.  ALLERGIES:  is allergic to lactose, codeine, penicillins, sulfa antibiotics, and vancomycin.  MEDICATIONS:  Current Outpatient Medications  Medication Sig Dispense Refill   senna-docusate (SENOKOT-S) 8.6-50 MG tablet Take 2 tablets by mouth 2 (two) times daily. 60 tablet 0   Cholecalciferol 50 MCG (2000 UT) CAPS Take 2,000 Units by mouth daily.     levothyroxine (SYNTHROID) 125 MCG tablet Take 125 mcg by mouth daily before breakfast.     losartan (COZAAR) 50 MG tablet Take 50 mg by mouth daily.     magnesium oxide (MAG-OX) 400 MG tablet Take 200 mg by mouth 2 (two) times daily.     Multiple Vitamins-Minerals (MULTIVITAMIN WOMEN 50+ PO) Take 1 tablet by mouth daily.     Omega-3 1000 MG CAPS Take 1,000 mg by mouth daily.     ondansetron (ZOFRAN) 8 MG tablet Take 1 tablet (8 mg total) by mouth every 8 (eight) hours as needed. (Patient taking differently: Take 8 mg by mouth every 8 (eight) hours as needed for nausea or vomiting.) 30 tablet 1   pantoprazole (PROTONIX) 20 MG tablet Take 20 mg by mouth daily as needed for heartburn or indigestion.     propranolol (INDERAL) 40 MG tablet Take 40 mg by mouth 2 (two) times daily.     rosuvastatin (CRESTOR) 5 MG tablet Take 5 mg by mouth every evening.     Simethicone 125 MG TABS Take 250 mg by mouth 2 (two) times daily as needed (gas).     No current facility-administered medications for this visit.  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 Evaluation of the pelvis is significantly limited by dense metallic streak artifact from adjacent hip arthroplasty. Within this limitation, there is expansile, masslike, heterogeneously enhancing appearance of the endometrium, measuring at least 5.2 cm in thickness. Findings are in keeping with reported diagnosis of endometrial malignancy. 2. No evidence of lymphadenopathy or metastatic disease in the chest, abdomen, or pelvis. 3. There is a 0.4 cm fissural nodule the superior segment left lower lobe, almost certainly a benign intrapulmonary lymph node. Attention on follow-up. 4. Coronary artery disease.   10/14/2021 Surgery   TRH/BSO, right SLN biopsy, left pelvic and PA LND, peritoneal nodule biopsy, mini-lap for specimen delivery  Findings: On EUA, 8cm bulbous uterus. ON intra-abdominal entry, normal upper abdominal survey. Normal omentum, small and large bowel. Uterus 8-10cm and bulbous. Normal appearing adnexa. Mapping successful on the right, no mapping on the left. Some mildly prominent lymph nodes bilaterally in the pelvis. Small, <5 mm, nodule in the cul de sac. No obvious intra-abdominal or pelvic evidence of disease.   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 SENTINEL LYMPH NODE, RIGHT EXTERNAL ILIAC, BIOPSY:  - Lymph node, negative for carcinoma (0/1)   B. SENTINEL LYMPH NODE, RIGHT OBTURATOR AND SURROUNDING LYMPH NODES,  BIOPSY:  - Lymph nodes, negative for carcinoma (0/5)   C. UTERUS, CERVIX, BILATERAL FALLOPIAN TUBES AND OVARIES:  - Invasive high-grade serous carcinoma  - Carcinoma  focally invades into the serosal surface  - Lymphovascular invasion is present  - Benign unremarkable cervix  - Benign unremarkable bilateral fallopian tubes and ovaries  - See oncology table   D. CUL DE SAC NODULE, POSTERIOR, BIOPSY:  - Endometriosis   E. LYMPH NODE, LEFT PELVIC, BIOPSY:  - Lymph nodes, negative for carcinoma (0/3)   F. LYMPH NODE, LEFT PARAORTIC, BIOPSY:  - Lymph nodes, negative for carcinoma (0/5)   ONCOLOGY TABLE:   UTERUS, CARCINOMA OR CARCINOSARCOMA: Resection   Procedure: Total hysterectomy and bilateral salpingo-oophorectomy  Histologic Type: Serous carcinoma  Histologic Grade: High-grade  Myometrial Invasion:       Depth of Myometrial Invasion (mm): 32 mm       Myometrial Thickness (mm): 32 mm       Percentage of Myometrial Invasion: 100%  Uterine Serosa Involvement: Present, focal  Cervical stromal Involvement: Not identified  Extent of involvement of other tissue/organs: Not identified  Peritoneal/Ascitic Fluid: Negative for carcinoma  Lymphovascular Invasion: Present  Regional Lymph Nodes:       Pelvic Lymph Nodes Examined:                                   6 Sentinel  3 Non-sentinel                                   9 Total       Pelvic Lymph Nodes with Metastasis: 0                           Macrometastasis: (>2.0 mm): 0                           Micrometastasis: (>0.2 mm and < 2.0 mm): 0                           Isolated Tumor Cells (<0.2 mm): 0                           Laterality of Lymph Node with Tumor: Not  applicable                           Extracapsular Extension: Not applicable       Para-aortic Lymph Nodes Examined:                                    0 Sentinel                                    5 Non-sentinel                                    5 Total       Para-aortic Lymph Nodes with Metastasis: 0                           Macrometastasis: (>2.0 mm): 0                            Micrometastasis:  (>0.2 mm and < 2.0 mm): 0                           Isolated Tumor Cells (<0.2 mm): 0                           Laterality of Lymph Node with Tumor: Not  applicable                           Extracapsular Extension: Not applicable  Distant Metastasis:       Distant Site(s) Involved: Not applicable  Pathologic Stage Classification (pTNM, AJCC 8th Edition): pT3a, pN0  Ancillary Studies: MMR / MSI testing will be ordered  Representative Tumor Block: C4  Comment(s): Pancytokeratin was performed on the lymph nodes and is negative.   By immunohistochemistry, HER-2 is EQUIVOCAL (2+).  HER-2 by FISH is pending and will be reported in an addendum.   FLOURESCENCE IN-SITU HYBRIDIZATION RESULTS:   GROUP 1:  HER2 **POSITIVE**   On the tissue sample received from  this individual HER2 FISH was performed by a technologist and cell imaging and analysis on the [employer].   RATIO of HER2/CEN 17 SIGNALS: 2.50  AVERAGE HER2 COPY NUMBER PER CELL: 4.74   The ratio of HER2/CEN 17 result exceeds the cutoff value of >=2.0 and a copy number of HER2 signals exceeding the cutoff range of >=4.0 signals per cell.  Arch Pathol Lab Med 1:1, 2018.    10/28/2021 Cancer Staging   Staging form: Corpus Uteri - Carcinoma and Carcinosarcoma, AJCC 8th Edition - Pathologic stage from 10/28/2021: Stage III (pT3, pN0, cM0) - Signed by Heath Lark, MD on 10/28/2021 Stage prefix: Initial diagnosis    11/07/2021 Procedure   Successful placement of a right internal jugular approach power injectable Port-A-Cath. The catheter is ready for immediate use.   11/11/2021 -  Chemotherapy   Patient is on Treatment Plan : UTERINE Carboplatin AUC 6 / Paclitaxel q21d      Genetic Testing   Negative genetic testing. No pathogenic variants identified on the [facility] Multi-Cancer+RNA panel. VUS in MET called c.3220A>G, VUS in MUTYh called c.1417G>A and VUS in POLE called c.596G>T identified. The report date is  01/20/2022.  The Multi-Cancer Panel + RNA offered by [facility] includes sequencing and/or deletion duplication testing of the following 84 genes: AIP, ALK, APC, ATM, AXIN2,BAP1,  BARD1, BLM, BMPR1A, BRCA1, BRCA2, BRIP1, CASR, CDC73, CDH1, CDK4, CDKN1B, CDKN1C, CDKN2A (p14ARF), CDKN2A (p16INK4a), CEBPA, CHEK2, CTNNA1, DICER1, DIS3L2, EGFR (c.2369C>T, p.Thr790Met variant only), EPCAM (Deletion/duplication testing only), FH, FLCN, GATA2, GPC3, GREM1 (Promoter region deletion/duplication testing only), HOXB13 (c.251G>A, p.Gly84Glu), HRAS, KIT, MAX, MEN1, MET, MITF (c.952G>A, p.Glu318Lys variant only), MLH1, MSH2, MSH3, MSH6, MUTYH, NBN, NF1, NF2, NTHL1, PALB2, PDGFRA, PHOX2B, PMS2, POLD1, POLE, POT1, PRKAR1A, PTCH1, PTEN, RAD50, RAD51C, RAD51D, RB1, RECQL4, RET, RUNX1, SDHAF2, SDHA (sequence changes only), SDHB, SDHC, SDHD, SMAD4, SMARCA4, SMARCB1, SMARCE1, STK11, SUFU, TERC, TERT, TMEM127, TP53, TSC1, TSC2, VHL, WRN and WT1.   Breast cancer of upper-outer quadrant of left female breast ([place])  11/19/2021 Imaging   IMPRESSION: 1. Highly suspicious mass in the left breast at 1 o'clock measuring 1.9 cm.   2.  No mammographic evidence of malignancy in the right breast   11/27/2021 Procedure   Appropriate positioning of the venous shaped biopsy marking clip at the site of biopsy in the 1 o'clock location of the LEFT breast.   12/02/2021 Initial Diagnosis   Breast cancer of upper-outer quadrant of left female breast ([place])   12/02/2021 Cancer Staging   Staging form: Breast, AJCC 8th Edition - Clinical stage from 12/02/2021: cT1c, cN0, cM0 - Signed by Heath Lark, MD on 12/02/2021 Stage prefix: Initial diagnosis     Genetic Testing   Negative genetic testing. No pathogenic variants identified on the [facility] Multi-Cancer+RNA panel. VUS in MET called c.3220A>G, VUS in MUTYh called c.1417G>A and VUS in POLE called c.596G>T identified. The report date is 01/20/2022.  The Multi-Cancer Panel + RNA offered by [facility]  includes sequencing and/or deletion duplication testing of the following 84 genes: AIP, ALK, APC, ATM, AXIN2,BAP1,  BARD1, BLM, BMPR1A, BRCA1, BRCA2, BRIP1, CASR, CDC73, CDH1, CDK4, CDKN1B, CDKN1C, CDKN2A (p14ARF), CDKN2A (p16INK4a), CEBPA, CHEK2, CTNNA1, DICER1, DIS3L2, EGFR (c.2369C>T, p.Thr790Met variant only), EPCAM (Deletion/duplication testing only), FH, FLCN, GATA2, GPC3, GREM1 (Promoter region deletion/duplication testing only), HOXB13 (c.251G>A, p.Gly84Glu), HRAS, KIT, MAX, MEN1, MET, MITF (c.952G>A, p.Glu318Lys variant only), MLH1, MSH2, MSH3, MSH6, MUTYH, NBN, NF1, NF2, NTHL1, PALB2, PDGFRA, PHOX2B, PMS2, POLD1, POLE, POT1, PRKAR1A, PTCH1, PTEN, RAD50, RAD51C, RAD51D, RB1, RECQL4,  RET, RUNX1, SDHAF2, SDHA (sequence changes only), SDHB, SDHC, SDHD, SMAD4, SMARCA4, SMARCB1, SMARCE1, STK11, SUFU, TERC, TERT, TMEM127, TP53, TSC1, TSC2, VHL, WRN and WT1.     PHYSICAL EXAMINATION: ECOG PERFORMANCE STATUS: 1 - Symptomatic but completely ambulatory  Vitals:   02/05/22 0925  BP: (!) 156/87  Pulse: 79  Resp: 15  Temp: 98.4 F (36.9 C)  SpO2: 99%   Filed Weights   02/05/22 0925  Weight: 183 lb (83 kg)    GENERAL:alert, no distress and comfortable SKIN: skin color, texture, turgor are normal, no rashes or significant lesions  NEURO: alert & oriented x 3 with fluent speech, no focal motor/sensory deficits  LABORATORY DATA:  I have reviewed the data as listed    Component Value Date/Time   NA 134 (L) 02/05/2022 0857   K 4.2 02/05/2022 0857   CL 104 02/05/2022 0857   CO2 21 (L) 02/05/2022 0857   GLUCOSE 177 (H) 02/05/2022 0857   BUN 21 02/05/2022 0857   CREATININE 0.93 02/05/2022 0857   CALCIUM 9.4 02/05/2022 0857   PROT 7.3 02/05/2022 0857   ALBUMIN 4.1 02/05/2022 0857   AST 23 02/05/2022 0857   ALT 15 02/05/2022 0857   ALKPHOS 83 02/05/2022 0857   BILITOT 0.4 02/05/2022 0857   GFRNONAA >60 02/05/2022 0857    No results found for: SPEP, UPEP  Lab Results  Component  Value Date   WBC 4.9 02/05/2022   NEUTROABS 4.0 02/05/2022   HGB 11.7 (L) 02/05/2022   HCT 35.3 (L) 02/05/2022   MCV 92.4 02/05/2022   PLT 158 02/05/2022      Chemistry      Component Value Date/Time   NA 134 (L) 02/05/2022 0857   K 4.2 02/05/2022 0857   CL 104 02/05/2022 0857   CO2 21 (L) 02/05/2022 0857   BUN 21 02/05/2022 0857   CREATININE 0.93 02/05/2022 0857      Component Value Date/Time   CALCIUM 9.4 02/05/2022 0857   ALKPHOS 83 02/05/2022 0857   AST 23 02/05/2022 0857   ALT 15 02/05/2022 0857   BILITOT 0.4 02/05/2022 0857

## 2022-02-06 ENCOUNTER — Encounter: Payer: Self-pay | Admitting: *Deleted

## 2022-02-11 ENCOUNTER — Encounter: Payer: Self-pay | Admitting: Radiology

## 2022-02-12 ENCOUNTER — Telehealth: Payer: Self-pay

## 2022-02-12 NOTE — Telephone Encounter (Signed)
Patient has Kristin Carlson at left nasal ala that was biopsied 07/29/21 has not been treated. Patient originally wanted to treat with [facility] then changed her mind and wanted [facility]. Referral was sent to [place] 09/04/21 and patient was scheduled 11/03/21. Per [name] at [facility], patient called and said she never received information about appointment and cancelled. She is rescheduled for May 23rd, 2023. ?Kristin Carlson., RMA ?

## 2022-02-15 NOTE — Progress Notes (Signed)
?Radiation Oncology         ([phone]) [phone] ?________________________________ ? ?Name: Kristin Carlson MRN: [id number]  ?Date: 02/16/2022  DOB: Mar 29, 1941 ? ?Follow-Up Visit Note ? ?CC: [name], FNP  Lafonda Mosses, MD ? ?  ICD-10-CM   ?1. Malignant neoplasm of upper-outer quadrant of left female breast, unspecified estrogen receptor status ([place])  C50.412   ?  ?2. Endometrial cancer ([place])  C54.1   ?  ? ? ?Diagnosis:  The encounter diagnosis was Endometrial cancer ([place]). ?  ?Stage III (pT3, pN0, cM0) endometrial cancer, high-grade serous ? ?New diagnosis of Left Breast UOQ, Invasive Mammary Carcinoma with DCIS, ER+ / PR+ / Her2-, Grade 2 ? ?Interval Since Last Radiation: 1 month and 6 days  ? ?Intent: Curative ? ?Radiation Treatment Dates: 12/18/2021 through 01/13/2022 ?Site Technique Total Dose (Gy) Dose per Fx (Gy) Completed Fx Beam Energies  ?Vagina: Pelvis HDR-brachy 30/30 6 5/5 Ir-192  ? ? ?Narrative:  The patient returns today for routine follow-up.  The patient tolerated radiation therapy quite well without any significant complaints. Since her initial consultation date of 11/03/21, the patient began chemotherapy consisting of carboplatin and paclitaxel on 11/11/21 under the care of Dr. Alvy Bimler. On 11/21/21, the patient presented to the ED for evaluation of redness around her port site. Following evaluation, the patient was determined to likely have a superficial infection and was prescribed a course of abx.            ? ?The patient continued to have redness and erythema around her port site, and completed several more courses of abx without much change. During a visit with Dr. Alvy Bimler on 01/05/22, the patient was found to have developed persistent drainage and worsening infection around her port site. Subsequently, the patient was admitted that same date for port removal. She was discharged home on 01/08/22 with antibiotics.  ? ?Per her most recent follow-up visit with Dr. Alvy Bimler on 02/05/22, the  patient was noted to be healing well and had not signs of recurrent infection on examination. So far, the only chemo toxicity reported by the patient has been some mild peripheral neuropathy in her feet ? ? ?New diagnosis of left breast cancer:  ? ?To review, the patient underwent a chest CT on 09/29/21 which incidentally showed a focal nodular asymmetry with a slight hyperenhancement in the outer left breast.  This prompted a diagnostic bilateral mammogram and right ultrasound on 11/19/21 which showed a highly suspicious mass in the left breast at 1 o'clock measuring ?1.9 cm.       ? ?Subsequent left breast biopsy (1 o'clock position, 6 cmfn) on 11/27/21 showed grade 2 invasive mammary carcinoma, with intermediate grade DCIS. Prognostic indicators significant for: estrogen receptor >90% positive with strong staining intensity; progesterone receptor 11-50% positive with moderate staining intensity; Her2 status negative; Grade 2.  ? ?Subsequently, the patient was referred to Dr. Donne Hazel on 12/12/21 to discuss surgical options. Given that the patient was (and is) undergoing chemotherapy, Dr. Donne Hazel advised the patient to return following completion of systemic treatment for repeat imaging.  (Despite her new diagnosis of  breast cancer, Dr. Alvy Bimler accordingly advised the patient to complete chemotherapy prior to pursuing further treatment for her breast cancer).  ? ?Genetic testing collected on 01/05/22 was negative for any pathogenic variants. However, several variants of uncertain significance were detected by [facility].  ? ?She reports tolerating her vaginal brachytherapy well.  She has noticed slight increase in her urinary incontinence since this  treatment but no other issues.  She denies any vaginal bleeding or discharge. ? ? ?Allergies:  is allergic to lactose, codeine, penicillins, sulfa antibiotics, and vancomycin. ? ?Meds: ?Current Outpatient Medications  ?Medication Sig Dispense Refill   ? Cholecalciferol 50 MCG (2000 UT) CAPS Take 2,000 Units by mouth daily.    ? levothyroxine (SYNTHROID) 125 MCG tablet Take 125 mcg by mouth daily before breakfast.    ? losartan (COZAAR) 50 MG tablet Take 50 mg by mouth daily.    ? magnesium oxide (MAG-OX) 400 MG tablet Take 200 mg by mouth 2 (two) times daily.    ? Multiple Vitamins-Minerals (MULTIVITAMIN WOMEN 50+ PO) Take 1 tablet by mouth daily.    ? Omega-3 1000 MG CAPS Take 1,000 mg by mouth daily.    ? ondansetron (ZOFRAN) 8 MG tablet Take 1 tablet (8 mg total) by mouth every 8 (eight) hours as needed. (Patient taking differently: Take 8 mg by mouth every 8 (eight) hours as needed for nausea or vomiting.) 30 tablet 1  ? pantoprazole (PROTONIX) 20 MG tablet Take 20 mg by mouth daily as needed for heartburn or indigestion.    ? propranolol (INDERAL) 40 MG tablet Take 40 mg by mouth 2 (two) times daily.    ? rosuvastatin (CRESTOR) 5 MG tablet Take 5 mg by mouth every evening.    ? senna-docusate (SENOKOT-S) 8.6-50 MG tablet Take 2 tablets by mouth 2 (two) times daily. 60 tablet 0  ? Simethicone 125 MG TABS Take 250 mg by mouth 2 (two) times daily as needed (gas).    ? ?No current facility-administered medications for this encounter.  ? ?Facility-Administered Medications Ordered in Other Encounters  ?Medication Dose Route Frequency Provider Last Rate Last Admin  ? sodium chloride flush (NS) 0.9 % injection 10 mL  10 mL Intracatheter Once Heath Lark, MD      ? ? ?Physical Findings: ?The patient is in no acute distress. Patient is alert and oriented. ? height is _0  (1.626 m) and weight is 186 lb 9.6 oz (84.6 kg). Her temperature is 97.7 ?F (36.5 ?C). Her blood pressure is 157/89 (abnormal) and her pulse is 73. Her respiration is 18 and oxygen saturation is 100%. . Lungs are clear to auscultation bilaterally. Heart has regular rate and rhythm. No palpable cervical, supraclavicular, or axillary adenopathy. Abdomen soft, non-tender, normal bowel sounds.  Pelvic  exam not performed today in light of recent completion of high-dose-rate treatment. ? ? ? ? ?Lab Findings: ?Lab Results  ?Component Value Date  ? WBC 4.9 02/05/2022  ? HGB 11.7 (L) 02/05/2022  ? HCT 35.3 (L) 02/05/2022  ? MCV 92.4 02/05/2022  ? PLT 158 02/05/2022  ? ? ?Radiographic Findings: ?No results found. ? ?Impression:  Stage III (pT3, pN0, cM0) endometrial cancer, high-grade serous ? ?New diagnosis of Left Breast UOQ, Invasive Mammary Carcinoma with DCIS, ER+ / PR+ / Her2-, Grade 2, she is to complete her therapy for her advanced endometrial cancer prior to pursuing treatment for her what appears to be early stage breast cancer. ? ?The patient is recovering from the effects of radiation.  Her only symptom has been a slight increase in her urinary incontinence. ? ?Plan: She will follow-up with Dr. Berline Lopes in approximately 2 months.  Radiation oncology in 5 months.  She will continue on chemotherapy.  Today the patient was given a vaginal dilator and instructions on its use. ? ? ?20 minutes of total time was spent for this patient encounter, including  preparation, face-to-face counseling with the patient and coordination of care, physical exam, and documentation of the encounter. ?____________________________________ ? ?Blair Promise, PhD, MD ? ? ?This document serves as a record of services personally performed by Gery Pray, MD. It was created on his behalf by Roney Mans, a trained medical scribe. The creation of this record is based on the scribe's personal observations and the provider's statements to them. This document has been checked and approved by the attending provider. ? ?

## 2022-02-16 ENCOUNTER — Other Ambulatory Visit: Payer: Self-pay

## 2022-02-16 ENCOUNTER — Ambulatory Visit
Admission: RE | Admit: 2022-02-16 | Discharge: 2022-02-16 | Disposition: A | Discharge status: Home / Self Care | Payer: Medicare Other | Source: Ambulatory Visit | Attending: Radiation Oncology | Admitting: Radiation Oncology

## 2022-02-16 ENCOUNTER — Encounter: Payer: Self-pay | Admitting: Radiation Oncology

## 2022-02-16 VITALS — BP 157/89 | HR 73 | Temp 97.7°F | Resp 18 | Ht 64.0 in | Wt 186.6 lb

## 2022-02-16 DIAGNOSIS — C541 Malignant neoplasm of endometrium: Secondary | ICD-10-CM | POA: Diagnosis present

## 2022-02-16 DIAGNOSIS — Z170 Estrogen receptor positive status [ER+]: Secondary | ICD-10-CM | POA: Insufficient documentation

## 2022-02-16 DIAGNOSIS — R32 Unspecified urinary incontinence: Secondary | ICD-10-CM | POA: Insufficient documentation

## 2022-02-16 DIAGNOSIS — Z923 Personal history of irradiation: Secondary | ICD-10-CM | POA: Diagnosis not present

## 2022-02-16 DIAGNOSIS — Z79899 Other long term (current) drug therapy: Secondary | ICD-10-CM | POA: Insufficient documentation

## 2022-02-16 DIAGNOSIS — C50412 Malignant neoplasm of upper-outer quadrant of left female breast: Secondary | ICD-10-CM | POA: Insufficient documentation

## 2022-02-16 DIAGNOSIS — G629 Polyneuropathy, unspecified: Secondary | ICD-10-CM | POA: Insufficient documentation

## 2022-02-16 NOTE — Progress Notes (Signed)
Kristin Carlson is here today for follow up post radiation to the pelvic. ? ?They completed their radiation on: 01/13/22  ? ?Does the patient complain of any of the following: ? ?Pain: Patient denies pain. ?Abdominal bloating: yes, patient reports this is a chronic condition.  ?Diarrhea/Constipation: intermittent diarrhea and constipation.  ?Nausea/Vomiting: nausea at times.  ?Vaginal Discharge: no ?Blood in Urine or Stool: no ?Urinary Issues (dysuria/incomplete emptying/ incontinence/ increased frequency/urgency): Patient reports worsening urine leakage.  ?Does patient report using vaginal dilator 2-3 times a week and/or sexually active 2-3 weeks: Patient given a s and s+ with instructions  and importance of  use. Patient voiced understanding.  ?Post radiation skin changes: no ? ? ?Additional comments if applicable: ?  ?Vitals:  ? 02/16/22 1520  ?BP: (!) 157/89  ?Pulse: 73  ?Resp: 18  ?Temp: 97.7 ?F (36.5 ?C)  ?SpO2: 100%  ?Weight: 186 lb 9.6 oz (84.6 kg)  ?Height: '5\' 4"'$  (1.626 m)  ?  ?

## 2022-02-17 ENCOUNTER — Telehealth: Payer: Self-pay | Admitting: *Deleted

## 2022-02-17 NOTE — Telephone Encounter (Signed)
Enid Derry from radiation called and scheduled the patient for a follow up appt on 5/8 at 1:15 pm. Appt scheduled and Enid Derry will contact the patient  ?

## 2022-02-17 NOTE — Telephone Encounter (Signed)
CALLED PATIENT TO INFORM OF FU APPT. WITH DR. KINARD ON 07-20-22 @ 3:30 PM AND HER FU APPT. WITH DR. Berline Lopes ON 04-06-22 - ARRIVAL TIME- 12:45 PM, SPOKE WITH PATIENT AND SHE IS AWARE OF THESE APPTS. ?

## 2022-02-26 ENCOUNTER — Encounter: Payer: Self-pay | Admitting: Hematology and Oncology

## 2022-02-26 ENCOUNTER — Inpatient Hospital Stay: Payer: Medicare Other

## 2022-02-26 ENCOUNTER — Other Ambulatory Visit: Payer: Self-pay | Admitting: *Deleted

## 2022-02-26 ENCOUNTER — Encounter: Payer: Self-pay | Admitting: *Deleted

## 2022-02-26 ENCOUNTER — Other Ambulatory Visit: Payer: Self-pay

## 2022-02-26 ENCOUNTER — Inpatient Hospital Stay (HOSPITAL_BASED_OUTPATIENT_CLINIC_OR_DEPARTMENT_OTHER): Payer: Medicare Other | Admitting: Hematology and Oncology

## 2022-02-26 VITALS — BP 164/84 | HR 78 | Resp 20

## 2022-02-26 VITALS — BP 157/91 | HR 88 | Temp 97.4°F | Resp 18 | Ht 64.0 in | Wt 185.6 lb

## 2022-02-26 DIAGNOSIS — I10 Essential (primary) hypertension: Secondary | ICD-10-CM | POA: Diagnosis not present

## 2022-02-26 DIAGNOSIS — C541 Malignant neoplasm of endometrium: Secondary | ICD-10-CM

## 2022-02-26 DIAGNOSIS — G620 Drug-induced polyneuropathy: Secondary | ICD-10-CM

## 2022-02-26 DIAGNOSIS — C50412 Malignant neoplasm of upper-outer quadrant of left female breast: Secondary | ICD-10-CM

## 2022-02-26 DIAGNOSIS — Z5111 Encounter for antineoplastic chemotherapy: Secondary | ICD-10-CM | POA: Diagnosis not present

## 2022-02-26 DIAGNOSIS — T451X5A Adverse effect of antineoplastic and immunosuppressive drugs, initial encounter: Secondary | ICD-10-CM

## 2022-02-26 LAB — CMP (CANCER CENTER ONLY)
ALT: 14 U/L (ref 0–44)
AST: 21 U/L (ref 15–41)
Albumin: 3.9 g/dL (ref 3.5–5.0)
Alkaline Phosphatase: 78 U/L (ref 38–126)
Anion gap: 11 (ref 5–15)
BUN: 22 mg/dL (ref 8–23)
CO2: 21 mmol/L — ABNORMAL LOW (ref 22–32)
Calcium: 8.9 mg/dL (ref 8.9–10.3)
Chloride: 104 mmol/L (ref 98–111)
Creatinine: 0.82 mg/dL (ref 0.44–1.00)
GFR, Estimated: >60 mL/min (ref >60)
Glucose, Bld: 214 mg/dL — ABNORMAL HIGH (ref 70–99)
Potassium: 4.1 mmol/L (ref 3.5–5.1)
Sodium: 136 mmol/L (ref 135–145)
Total Bilirubin: 0.4 mg/dL (ref 0.3–1.2)
Total Protein: 7.3 g/dL (ref 6.5–8.1)

## 2022-02-26 LAB — CBC WITH DIFFERENTIAL (CANCER CENTER ONLY)
Abs Immature Granulocytes: 0.02 10*3/uL (ref 0.00–0.07)
Basophils Absolute: 0 10*3/uL (ref 0.0–0.1)
Basophils Relative: 0 %
Eosinophils Absolute: 0 10*3/uL (ref 0.0–0.5)
Eosinophils Relative: 0 %
HCT: 33.5 % — ABNORMAL LOW (ref 36.0–46.0)
Hemoglobin: 11.3 g/dL — ABNORMAL LOW (ref 12.0–15.0)
Immature Granulocytes: 0 %
Lymphocytes Relative: 16 %
Lymphs Abs: 0.9 10*3/uL (ref 0.7–4.0)
MCH: 31.3 pg (ref 26.0–34.0)
MCHC: 33.7 g/dL (ref 30.0–36.0)
MCV: 92.8 fL (ref 80.0–100.0)
Monocytes Absolute: 0 10*3/uL — ABNORMAL LOW (ref 0.1–1.0)
Monocytes Relative: 1 %
Neutro Abs: 4.5 10*3/uL (ref 1.7–7.7)
Neutrophils Relative %: 83 %
Platelet Count: 172 10*3/uL (ref 150–400)
RBC: 3.61 MIL/uL — ABNORMAL LOW (ref 3.87–5.11)
RDW: 14.6 % (ref 11.5–15.5)
WBC Count: 5.5 10*3/uL (ref 4.0–10.5)
nRBC: 0 % (ref 0.0–0.2)

## 2022-02-26 MED ORDER — SODIUM CHLORIDE 0.9 % IV SOLN
510.0000 mg | Freq: Once | INTRAVENOUS | Status: AC
  Administered 2022-02-26: 510 mg via INTRAVENOUS
  Filled 2022-02-26: qty 51

## 2022-02-26 MED ORDER — PALONOSETRON HCL INJECTION 0.25 MG/5ML
0.2500 mg | Freq: Once | INTRAVENOUS | Status: AC
  Administered 2022-02-26: 0.25 mg via INTRAVENOUS
  Filled 2022-02-26: qty 5

## 2022-02-26 MED ORDER — SODIUM CHLORIDE 0.9 % IV SOLN: Freq: Once | INTRAVENOUS | Status: AC

## 2022-02-26 MED ORDER — SODIUM CHLORIDE 0.9 % IV SOLN
150.0000 mg | Freq: Once | INTRAVENOUS | Status: AC
  Administered 2022-02-26: 150 mg via INTRAVENOUS
  Filled 2022-02-26: qty 150

## 2022-02-26 MED ORDER — SODIUM CHLORIDE 0.9 % IV SOLN
131.2500 mg/m2 | Freq: Once | INTRAVENOUS | Status: AC
  Administered 2022-02-26: 252 mg via INTRAVENOUS
  Filled 2022-02-26: qty 42

## 2022-02-26 MED ORDER — DIPHENHYDRAMINE HCL 50 MG/ML IJ SOLN
25.0000 mg | Freq: Once | INTRAMUSCULAR | Status: AC
  Administered 2022-02-26: 25 mg via INTRAVENOUS
  Filled 2022-02-26: qty 1

## 2022-02-26 MED ORDER — SODIUM CHLORIDE 0.9 % IV SOLN
10.0000 mg | Freq: Once | INTRAVENOUS | Status: AC
  Administered 2022-02-26: 10 mg via INTRAVENOUS
  Filled 2022-02-26: qty 10

## 2022-02-26 MED ORDER — FAMOTIDINE IN NACL 20-0.9 MG/50ML-% IV SOLN
20.0000 mg | Freq: Once | INTRAVENOUS | Status: AC
  Administered 2022-02-26: 20 mg via INTRAVENOUS
  Filled 2022-02-26: qty 50

## 2022-02-26 NOTE — Patient Instructions (Signed)
[facility]  Discharge Instructions: Thank you for choosing [facility] to provide your oncology and hematology care.   If you have a lab appointment with the Cancer Center, please go directly to the Cancer Center and check in at the registration area.   Wear comfortable clothing and clothing appropriate for easy access to any Portacath or PICC line.   We strive to give you quality time with your provider. You may need to reschedule your appointment if you arrive late (15 or more minutes).  Arriving late affects you and other patients whose appointments are after yours.  Also, if you miss three or more appointments without notifying the office, you may be dismissed from the clinic at the provider's discretion.      For prescription refill requests, have your pharmacy contact our office and allow 72 hours for refills to be completed.    Today you received the following chemotherapy and/or immunotherapy agents: Taxol & Carboplatin   To help prevent nausea and vomiting after your treatment, we encourage you to take your nausea medication as directed.  BELOW ARE SYMPTOMS THAT SHOULD BE REPORTED IMMEDIATELY: *FEVER GREATER THAN 100.4 F (38 C) OR HIGHER *CHILLS OR SWEATING *NAUSEA AND VOMITING THAT IS NOT CONTROLLED WITH YOUR NAUSEA MEDICATION *UNUSUAL SHORTNESS OF BREATH *UNUSUAL BRUISING OR BLEEDING *URINARY PROBLEMS (pain or burning when urinating, or frequent urination) *BOWEL PROBLEMS (unusual diarrhea, constipation, pain near the anus) TENDERNESS IN MOUTH AND THROAT WITH OR WITHOUT PRESENCE OF ULCERS (sore throat, sores in mouth, or a toothache) UNUSUAL RASH, SWELLING OR PAIN  UNUSUAL VAGINAL DISCHARGE OR ITCHING   Items with * indicate a potential emergency and should be followed up as soon as possible or go to the Emergency Department if any problems should occur.  Please show the CHEMOTHERAPY ALERT CARD or IMMUNOTHERAPY ALERT CARD at  check-in to the Emergency Department and triage nurse.  Should you have questions after your visit or need to cancel or reschedule your appointment, please contact [facility]  Dept: [phone]  and follow the prompts.  Office hours are 8:00 a.m. to 4:30 p.m. Monday - Friday. Please note that voicemails left after 4:00 p.m. may not be returned until the following business day.  We are closed weekends and major holidays. You have access to a nurse at all times for urgent questions. Please call the main number to the clinic Dept: [phone] and follow the prompts.   For any non-urgent questions, you may also contact your provider using MyChart. We now offer e-Visits for anyone 18 and older to request care online for non-urgent symptoms. For details visit [email or web address].   Also download the MyChart app! Go to the app store, search "MyChart", open the app, select [facility], and log in with your MyChart username and password.  Due to Covid, a mask is required upon entering the hospital/clinic. If you do not have a mask, one will be given to you upon arrival. For doctor visits, patients may have 1 support person aged 18 or older with them. For treatment visits, patients cannot have anyone with them due to current Covid guidelines and our immunocompromised population.   

## 2022-02-26 NOTE — Progress Notes (Signed)
[facility] ?OFFICE PROGRESS NOTE ? ?Patient Care Team: ?[name], FNP as PCP - General (Nurse Practitioner) ?[name], RN as Oncology Nurse Navigator (Oncology) ? ?ASSESSMENT & PLAN:  ?Endometrial cancer ([place]) ?She tolerated recent treatment well ?She will complete last cycle of treatment today ?I plan to order imaging study next month for further follow-up ?She has appointment to follow-up with GYN surgeon and radiation oncologist ? ?Peripheral neuropathy due to chemotherapy [facility]) ?She denies worsening peripheral neuropathy ?We will observe closely ?We will proceed with treatment without delay or further dose adjustment ? 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 ?Essential hypertension ?Her blood pressure is high today but at home is within normal range ?We will proceed with treatment without delay ? ?Orders Placed This Encounter  ?Procedures  ? CT CHEST ABDOMEN PELVIS W CONTRAST  ?  Standing Status:   Future  ?  Standing Expiration Date:   02/27/2023  ?  Order Specific Question:   Preferred imaging location?  ?  Answer:   [facility]  ?  Order Specific Question:   Radiology Contrast Protocol - do NOT remove file path  ?  Answer:   _[email or web address]  ? ? ?All questions were answered. The patient knows to call the clinic with any problems, questions or concerns. ?The total time spent in the appointment was 30 minutes encounter with patients including review of chart and various tests results, discussions about plan of care and coordination of care  plan ?  ?Heath Lark, MD ?02/26/2022 1:08 PM ? ?INTERVAL HISTORY: ?Please see below for problem oriented charting. ?she returns for treatment follow-up seen prior to cycle 6 of chemotherapy ?She tolerated last cycle well ?She has some mild intermittent neuropathy, stable ?Denies nausea or changes in bowel habits ?No recent infection, fever or chills ?The patient required assistance to get appointment scheduled to see her surgeon for future planned lumpectomy ? ?REVIEW OF SYSTEMS:   ?Constitutional: Denies fevers, chills or abnormal weight loss ?Eyes: Denies blurriness of vision ?Ears, nose, mouth, throat, and face: Denies mucositis or sore throat ?Respiratory: Denies cough, dyspnea or wheezes ?Cardiovascular: Denies palpitation, chest discomfort or lower extremity swelling ?Gastrointestinal:  Denies nausea, heartburn or change in bowel habits ?Skin: Denies abnormal skin rashes ?Behavioral/Psych: Mood is stable, no new changes  ?All other systems were reviewed with the patient and are negative. ? ?I have reviewed the past medical history, past surgical history, social history and family history with the patient and they are unchanged from previous note. ? ?ALLERGIES:  is allergic to lactose, codeine, penicillins, sulfa antibiotics, and vancomycin. ? ?MEDICATIONS:  ?Current Outpatient Medications  ?Medication Sig Dispense Refill  ? Cholecalciferol 50 MCG (2000 UT) CAPS Take 2,000 Units by mouth daily.    ? levothyroxine (SYNTHROID) 125 MCG tablet Take 125 mcg by mouth daily before breakfast.    ? losartan (COZAAR) 50 MG tablet Take 50 mg by mouth daily.    ? magnesium oxide (MAG-OX) 400 MG tablet Take 200 mg by mouth 2 (two) times daily.    ? Multiple Vitamins-Minerals (MULTIVITAMIN WOMEN 50+ PO)  Take 1 tablet by mouth daily.    ? Omega-3 1000 MG CAPS Take 1,000 mg by mouth daily.    ? ondansetron (ZOFRAN) 8 MG tablet Take 1 tablet (8 mg total) by mouth every 8 (eight) hours as needed. (Patient taking differently: Take  8 mg by mouth every 8 (eight) hours as needed for nausea or vomiting.) 30 tablet 1  ? pantoprazole (PROTONIX) 20 MG tablet Take 20 mg by mouth daily as needed for heartburn or indigestion.    ? propranolol (INDERAL) 40 MG tablet Take 40 mg by mouth 2 (two) times daily.    ? rosuvastatin (CRESTOR) 5 MG tablet Take 5 mg by mouth every evening.    ? senna-docusate (SENOKOT-S) 8.6-50 MG tablet Take 2 tablets by mouth 2 (two) times daily. 60 tablet 0  ? Simethicone 125 MG TABS Take 250 mg by mouth 2 (two) times daily as needed (gas).    ? ?No current facility-administered medications for this visit.  ? ?Facility-Administered Medications Ordered in Other Visits  ?Medication Dose Route Frequency Provider Last Rate Last Admin  ? CARBOplatin (PARAPLATIN) 510 mg in sodium chloride 0.9 % 250 mL chemo infusion  510 mg Intravenous Once Gorsuch, Ni, MD      ? PACLitaxel (TAXOL) 252 mg in sodium chloride 0.9 % 250 mL chemo infusion (> 80mg/m2)  131.25 mg/m2 (Treatment Plan Recorded) Intravenous Once Gorsuch, Ni, MD      ? sodium chloride flush (NS) 0.9 % injection 10 mL  10 mL Intracatheter Once Gorsuch, Ni, MD      ? ? 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  ?Endometrial cancer (HCC)  ?09/11/2021 Initial Biopsy  ? EMB: gr 3 endometrial cancer, favor endometrioid ?  ?09/22/2021 Initial Diagnosis  ? Endometrial cancer (HCC) ?  ?09/29/2021 Imaging  ? CT C/A/P: ?1. Evaluation of the pelvis is significantly limited by dense metallic streak artifact from adjacent hip arthroplasty. Within this limitation, there is expansile, masslike, heterogeneously enhancing appearance of the endometrium, measuring at least 5.2 cm in thickness. Findings are in keeping with reported diagnosis of endometrial malignancy. ?2. No evidence of lymphadenopathy or metastatic disease in the chest, abdomen, or pelvis. ?3. There is a 0.4 cm fissural nodule the superior segment left  lower lobe, almost certainly a benign intrapulmonary lymph node. Attention on follow-up. ?4. Coronary artery disease. ?  ?10/14/2021 Surgery  ? TRH/BSO, right SLN biopsy, left pelvic and PA LND, peritoneal nodule biopsy, mini-lap for specimen delivery ? ?Findings: On EUA, 8cm bulbous uterus. ON intra-abdominal entry, normal upper abdominal survey. Normal omentum, small and large bowel. Uterus 8-10cm and bulbous. Normal appearing adnexa. Mapping successful on the right, no mapping on the left. Some mildly prominent lymph nodes bilaterally in the pelvis. Small, <5 mm, nodule in the cul de sac. No obvious intra-abdominal or pelvic evidence of disease. ?  ?10/14/2021 Pathology Results  ? 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  ?FINAL MICROSCOPIC DIAGNOSIS:  ? ?A. SENTINEL LYMPH NODE, RIGHT EXTERNAL ILIAC, BIOPSY:  ?- Lymph node, negative for carcinoma (0/1)  ? ?B. SENTINEL LYMPH NODE, RIGHT OBTURATOR AND SURROUNDING LYMPH NODES,  ?BIOPSY:  ?- Lymph nodes, negative for carcinoma (0/5)  ? ?C. UTERUS, CERVIX, BILATERAL FALLOPIAN TUBES AND OVARIES:  ?- Invasive   high-grade serous carcinoma  ?- Carcinoma focally invades into the serosal surface  ?- Lymphovascular invasion is present  ?- Benign unremarkable cervix  ?- Benign unremarkable bilateral fallopian tubes and ovaries  ?- See oncology table  ? ?D. CUL DE SAC NODULE, POSTERIOR, BIOPSY:  ?- Endometriosis  ? ?E. LYMPH NODE, LEFT PELVIC, BIOPSY:  ?- Lymph nodes, negative for carcinoma (0/3)  ? ?F. LYMPH NODE, LEFT PARAORTIC, BIOPSY:  ?- Lymph nodes, negative for carcinoma (0/5)  ? ?ONCOLOGY TABLE:  ? ?UTERUS, CARCINOMA OR CARCINOSARCOMA: Resection  ? ?Procedure: Total hysterectomy and bilateral salpingo-oophorectomy  ?Histologic Type: Serous carcinoma  ?Histologic Grade: High-grade  ?Myometrial  Invasion:  ?     Depth of Myometrial Invasion (mm): 32 mm  ?     Myometrial Thickness (mm): 32 mm  ?     Percentage of Myometrial Invasion: 100%  ?Uterine Serosa Involvement: Present, focal  ?Cervical stro

## 2022-02-26 NOTE — Assessment & Plan Note (Signed)
She tolerated recent treatment well ?She will complete last cycle of treatment today ?I plan to order imaging study next month for further follow-up ?She has appointment to follow-up with GYN surgeon and radiation oncologist ?

## 2022-02-26 NOTE — Assessment & Plan Note (Signed)
Her blood pressure is high today but at home is within normal range ?We will proceed with treatment without delay ?

## 2022-02-26 NOTE — Assessment & Plan Note (Signed)
She was diagnosed with breast cancer, surgery was placed on hold to complete adjuvant treatment for uterine cancer ?I will reach out to breast cancer navigators to arrange for repeat imaging study and follow-up with her breast surgeon ?I will see her back next month for further follow-up ?If surgery has not been completed by then, I will start her on aromatase inhibitor ?

## 2022-02-26 NOTE — Assessment & Plan Note (Addendum)
She denies worsening peripheral neuropathy ?We will observe closely ?We will proceed with treatment without delay or further dose adjustment ?

## 2022-03-04 ENCOUNTER — Encounter: Payer: Self-pay | Admitting: *Deleted

## 2022-03-04 ENCOUNTER — Telehealth: Payer: Self-pay | Admitting: *Deleted

## 2022-03-04 NOTE — Telephone Encounter (Signed)
Spoke with patient to inform her of the appt for her mammo/us 4/21 at 1110 and her follow up with Dr. Donne Hazel 4/25 @ 1130.  Patient verbalized understanding. ?

## 2022-03-20 ENCOUNTER — Ambulatory Visit
Admission: RE | Admit: 2022-03-20 | Discharge: 2022-03-20 | Disposition: A | Discharge status: Home / Self Care | Payer: Medicare Other | Source: Ambulatory Visit | Attending: General Surgery | Admitting: General Surgery

## 2022-03-20 ENCOUNTER — Telehealth: Payer: Self-pay

## 2022-03-20 DIAGNOSIS — C541 Malignant neoplasm of endometrium: Secondary | ICD-10-CM | POA: Diagnosis present

## 2022-03-20 DIAGNOSIS — C50412 Malignant neoplasm of upper-outer quadrant of left female breast: Secondary | ICD-10-CM | POA: Diagnosis present

## 2022-03-20 NOTE — Telephone Encounter (Signed)
She called back. CT is scheduled at  [facility] 4/28. Scheduled lab appt at 1115 prior to Dr. Alvy Bimler appt on 5/1.  ?

## 2022-03-20 NOTE — Telephone Encounter (Signed)
Called and left a message asking her to call the office back regarding adding lab prior to CT. ?

## 2022-03-23 ENCOUNTER — Encounter: Payer: Self-pay | Admitting: *Deleted

## 2022-03-27 ENCOUNTER — Ambulatory Visit
Admission: RE | Admit: 2022-03-27 | Discharge: 2022-03-27 | Disposition: A | Discharge status: Home / Self Care | Payer: Medicare Other | Source: Ambulatory Visit | Attending: Hematology and Oncology | Admitting: Hematology and Oncology

## 2022-03-27 ENCOUNTER — Other Ambulatory Visit: Payer: TRICARE For Life (TFL)

## 2022-03-27 ENCOUNTER — Other Ambulatory Visit: Payer: Self-pay

## 2022-03-27 DIAGNOSIS — C541 Malignant neoplasm of endometrium: Secondary | ICD-10-CM | POA: Insufficient documentation

## 2022-03-27 MED ORDER — IOHEXOL 300 MG/ML  SOLN
100.0000 mL | Freq: Once | INTRAMUSCULAR | Status: AC | PRN
  Administered 2022-03-27: 100 mL via INTRAVENOUS

## 2022-03-30 ENCOUNTER — Encounter: Payer: Self-pay | Admitting: Hematology and Oncology

## 2022-03-30 ENCOUNTER — Inpatient Hospital Stay: Payer: Medicare Other

## 2022-03-30 ENCOUNTER — Inpatient Hospital Stay: Payer: Medicare Other | Attending: Gynecologic Oncology | Admitting: Hematology and Oncology

## 2022-03-30 ENCOUNTER — Other Ambulatory Visit: Payer: Self-pay

## 2022-03-30 DIAGNOSIS — C50412 Malignant neoplasm of upper-outer quadrant of left female breast: Secondary | ICD-10-CM | POA: Insufficient documentation

## 2022-03-30 DIAGNOSIS — I10 Essential (primary) hypertension: Secondary | ICD-10-CM | POA: Diagnosis not present

## 2022-03-30 DIAGNOSIS — C541 Malignant neoplasm of endometrium: Secondary | ICD-10-CM | POA: Insufficient documentation

## 2022-03-30 LAB — CMP (CANCER CENTER ONLY)
ALT: 16 U/L (ref 0–44)
AST: 23 U/L (ref 15–41)
Albumin: 3.7 g/dL (ref 3.5–5.0)
Alkaline Phosphatase: 74 U/L (ref 38–126)
Anion gap: 4 — ABNORMAL LOW (ref 5–15)
BUN: 20 mg/dL (ref 8–23)
CO2: 27 mmol/L (ref 22–32)
Calcium: 8.9 mg/dL (ref 8.9–10.3)
Chloride: 106 mmol/L (ref 98–111)
Creatinine: 0.88 mg/dL (ref 0.44–1.00)
GFR, Estimated: >60 mL/min (ref >60)
Glucose, Bld: 112 mg/dL — ABNORMAL HIGH (ref 70–99)
Potassium: 4.2 mmol/L (ref 3.5–5.1)
Sodium: 137 mmol/L (ref 135–145)
Total Bilirubin: 0.3 mg/dL (ref 0.3–1.2)
Total Protein: 6.6 g/dL (ref 6.5–8.1)

## 2022-03-30 LAB — CBC WITH DIFFERENTIAL (CANCER CENTER ONLY)
Abs Immature Granulocytes: 0.01 10*3/uL (ref 0.00–0.07)
Basophils Absolute: 0 10*3/uL (ref 0.0–0.1)
Basophils Relative: 0 %
Eosinophils Absolute: 0.1 10*3/uL (ref 0.0–0.5)
Eosinophils Relative: 3 %
HCT: 33.8 % — ABNORMAL LOW (ref 36.0–46.0)
Hemoglobin: 11.1 g/dL — ABNORMAL LOW (ref 12.0–15.0)
Immature Granulocytes: 0 %
Lymphocytes Relative: 23 %
Lymphs Abs: 1 10*3/uL (ref 0.7–4.0)
MCH: 31.3 pg (ref 26.0–34.0)
MCHC: 32.8 g/dL (ref 30.0–36.0)
MCV: 95.2 fL (ref 80.0–100.0)
Monocytes Absolute: 0.8 10*3/uL (ref 0.1–1.0)
Monocytes Relative: 19 %
Neutro Abs: 2.5 10*3/uL (ref 1.7–7.7)
Neutrophils Relative %: 55 %
Platelet Count: 174 10*3/uL (ref 150–400)
RBC: 3.55 MIL/uL — ABNORMAL LOW (ref 3.87–5.11)
RDW: 14.1 % (ref 11.5–15.5)
WBC Count: 4.5 10*3/uL (ref 4.0–10.5)
nRBC: 0 % (ref 0.0–0.2)

## 2022-03-30 MED ORDER — ANASTROZOLE 1 MG PO TABS: 1.0000 mg | ORAL_TABLET | Freq: Every day | ORAL | 1 refills | Status: DC

## 2022-03-30 MED ORDER — AZITHROMYCIN 500 MG PO TABS
500.0000 mg | ORAL_TABLET | Freq: Every day | ORAL | 0 refills | Status: DC
Start: 2022-03-30 — End: 2022-05-04

## 2022-03-30 NOTE — Progress Notes (Signed)
[facility] ?OFFICE PROGRESS NOTE ? ?Patient Care Team: ?[name], FNP as PCP - General (Nurse Practitioner) ?[name], RN as Oncology Nurse Navigator (Oncology) ? ?ASSESSMENT & PLAN:  ?Endometrial cancer ([place]) ?I have reviewed multiple imaging studies with the patient and her husband ?CT imaging showed complete response to therapy ?She has indeterminate lung nodule on the left upper lobe which we will follow with CT imaging in 3 months, due around August ?The breast lesion has regressed in size, confirmed on multiple other breast imaging ?She is still recovering from side effects of chemotherapy ?We will continue supportive care ? 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 ?Essential hypertension ?Blood pressure is elevated likely due to anxiety ?Observe closely for now ? ?No orders of the defined types were placed in this encounter. ? ? ?All questions were answered. The patient knows to call the clinic with any problems, questions or concerns. ?The total time spent in the appointment was 40 minutes encounter with patients including review of chart and various tests results, discussions about plan of care and coordination of care plan ?  ?Heath Lark, MD ?03/30/2022 3:13 PM ? ?INTERVAL HISTORY: ?Please see below for problem oriented charting. ?she returns for treatment follow-up with her husband ?I have reviewed documentation from general surgery ?I have also reviewed multiple imaging studies with the patient and her husband ?She complains of fatigue since completion of chemotherapy ?Denies nausea or changes in bowel habits ?No signs or symptoms of  infection ? ?REVIEW OF SYSTEMS:   ?Constitutional: Denies fevers, chills or abnormal weight loss ?Eyes: Denies blurriness of vision ?Ears, nose, mouth, throat, and face: Denies mucositis or sore throat ?Respiratory: Denies cough, dyspnea or wheezes ?Cardiovascular: Denies palpitation, chest discomfort or lower extremity swelling ?Gastrointestinal:  Denies nausea, heartburn or change in bowel habits ?Skin: Denies abnormal skin rashes ?Lymphatics: Denies new lymphadenopathy or easy bruising ?Neurological:Denies numbness, tingling or new weaknesses ?Behavioral/Psych: Mood is stable, no new changes  ?All other systems were reviewed with the patient and are negative. ? ?I have reviewed the past medical history, past surgical history, social history and family history with the patient and they are unchanged from previous note. ? ?ALLERGIES:  is allergic to lactose, codeine, penicillins, sulfa antibiotics, and vancomycin. ? ?MEDICATIONS:  ?Current Outpatient Medications  ?Medication Sig Dispense Refill  ? anastrozole (ARIMIDEX) 1 MG tablet Take 1 tablet (1 mg total) by mouth daily. 90 tablet 1  ? azithromycin (ZITHROMAX) 500 MG tablet Take 1 tablet (500 mg total) by mouth daily. 3 tablet 0  ? Cholecalciferol 50 MCG (2000 UT) CAPS Take 2,000 Units by mouth daily.    ? levothyroxine (SYNTHROID) 125 MCG tablet Take 125 mcg by mouth daily before breakfast.    ? losartan (COZAAR) 50 MG tablet Take 50 mg by mouth daily.    ? magnesium oxide (MAG-OX) 400 MG tablet Take 200 mg by mouth 2 (two) times daily.    ? Multiple Vitamins-Minerals (MULTIVITAMIN WOMEN 50+ PO) Take 1 tablet by mouth daily.    ? Omega-3 1000 MG CAPS Take 1,000 mg by mouth daily.    ? ondansetron (ZOFRAN) 8 MG  tablet Take 1 tablet (8 mg total) by mouth every 8 (eight) hours as needed. (Patient taking differently: Take 8 mg by mouth every 8 (eight) hours as needed for nausea or vomiting.) 30 tablet 1  ? pantoprazole (PROTONIX) 20 MG tablet Take 20 mg by mouth  daily as needed for heartburn or indigestion.    ? propranolol (INDERAL) 40 MG tablet Take 40 mg by mouth 2 (two) times daily.    ? rosuvastatin (CRESTOR) 5 MG tablet Take 5 mg by mouth every evening.    ? senna-docusate (SENOKOT-S) 8.6-50 MG tablet Take 2 tablets by mouth 2 (two) times daily. 60 tablet 0  ? Simethicone 125 MG TABS Take 250 mg by mouth 2 (two) times daily as needed (gas).    ? ?No current facility-administered medications for this visit.  ? ?Facility-Administered Medications Ordered in Other Visits  ?Medication Dose Route Frequency Provider Last Rate Last Admin  ? sodium chloride flush (NS) 0.9 % injection 10 mL  10 mL Intracatheter Once Heath Lark, MD      ? ? 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  ?Endometrial cancer ([place])  ?09/11/2021 Initial Biopsy  ? EMB: gr 3 endometrial cancer, favor endometrioid ?  ?09/22/2021 Initial Diagnosis  ? Endometrial cancer [facility]) ?  ?09/29/2021 Imaging  ? CT C/A/P: ?1. Evaluation of the pelvis is significantly limited by dense metallic streak artifact from adjacent hip arthroplasty. Within this limitation, there is expansile, masslike, heterogeneously enhancing appearance of the endometrium, measuring at least 5.2 cm in thickness. Findings are in keeping with reported diagnosis of endometrial malignancy. ?2. No evidence of lymphadenopathy or metastatic disease in the chest, abdomen, or pelvis. ?3. There is a 0.4 cm fissural nodule the superior segment left lower lobe, almost certainly a benign intrapulmonary lymph node. Attention on follow-up. ?4. Coronary artery disease. ?  ?10/14/2021 Surgery  ? TRH/BSO, right SLN biopsy, left pelvic and PA LND, peritoneal nodule biopsy, mini-lap for specimen delivery ? ?Findings: On EUA, 8cm bulbous uterus. ON intra-abdominal entry, normal upper abdominal survey. Normal omentum, small and large bowel. Uterus 8-10cm and bulbous. Normal appearing  adnexa. Mapping successful on the right, no mapping on the left. Some mildly prominent lymph nodes bilaterally in the pelvis. Small, <5 mm, nodule in the cul de sac. No obvious intra-abdominal or pelvic evidence of disease. ?  ?10/14/2021 Pathology Results  ? 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  ?FINAL MICROSCOPIC DIAGNOSIS:  ? ?A. SENTINEL LYMPH NODE, RIGHT EXTERNAL ILIAC, BIOPSY:  ?- Lymph node, negative for carcinoma (0/1)  ? ?B. SENTINEL LYMPH NODE, RIGHT OBTURATOR AND SURROUNDING LYMPH NODES,  ?BIOPSY:  ?- Lymph nodes, negative for carcinoma (0/5)  ? ?C. UTERUS, CERVIX, BILATERAL FALLOPIAN TUBES AND OVARIES:  ?- Invasive high-grade serous carcinoma  ?- Carcinoma focally invades into the serosal surface  ?- Lymphovascular invasion is present  ?- Benign unremarkable cervix  ?- Benign unremarkable bilateral fallopian tubes and ovaries  ?- See oncology table  ? ?D. CUL DE SAC NODULE, POSTERIOR, BIOPSY:  ?- Endometriosis  ? ?E. LYMPH NODE, LEFT PELVIC, BIOPSY:  ?- Lymph nodes, negative for carcinoma (0/3)  ? ?F. LYMPH NODE, LEFT PARAORTIC, BIOPSY:  ?- Lymph nodes, negative for carcinoma (0/5)  ? ?ONCOLOGY TABLE:  ? ?  UTERUS, CARCINOMA OR CARCINOSARCOMA: Resection  ? ?Procedure: Total hysterectomy and bilateral salpingo-oophorectomy  ?Histologic Type: Serous carcinoma  ?Histologic Grade: High-grade  ?Myometrial Invasion:  ?     Depth of Myometrial Invasion (mm): 32 mm  ?     Myometrial Thickness (mm): 32 mm  ?     Percentage of Myometrial Invasion: 100%  ?Uterine Serosa Involvement: Present, focal  ?Cervical stromal Involvement: Not identified  ?Extent of involvement of other tissue/organs: Not identified  ?Peritoneal/Ascitic Fluid: Negative for carcinoma  ?Lymphovascular Invasion: Present  ?Regional Lymph Nodes:  ?     Pelvic Lymph Nodes  Examined:  ?                                 6 Sentinel  ?                                 3 Non-sentinel  ?                                 9 Total  ?     Pelvic Lymph Nodes with Metastasis: 0  ?

## 2022-03-30 NOTE — Assessment & Plan Note (Signed)
I have reviewed multiple imaging studies and documentation provided by her general surgeon ?She is not ready to proceed with surgery ?We discussed the risk and benefits of antiestrogen therapy and she is in agreement to proceed ?I will start her on Arimidex ?I will see her back in a month for further follow-up ?

## 2022-03-30 NOTE — Assessment & Plan Note (Signed)
Blood pressure is elevated likely due to anxiety ?Observe closely for now ?

## 2022-03-30 NOTE — Assessment & Plan Note (Signed)
I have reviewed multiple imaging studies with the patient and her husband ?CT imaging showed complete response to therapy ?She has indeterminate lung nodule on the left upper lobe which we will follow with CT imaging in 3 months, due around August ?The breast lesion has regressed in size, confirmed on multiple other breast imaging ?She is still recovering from side effects of chemotherapy ?We will continue supportive care ?

## 2022-04-01 ENCOUNTER — Encounter: Payer: Self-pay | Admitting: Gynecologic Oncology

## 2022-04-06 ENCOUNTER — Other Ambulatory Visit: Payer: Self-pay

## 2022-04-06 ENCOUNTER — Encounter: Payer: Self-pay | Admitting: Gynecologic Oncology

## 2022-04-06 ENCOUNTER — Inpatient Hospital Stay (HOSPITAL_BASED_OUTPATIENT_CLINIC_OR_DEPARTMENT_OTHER): Payer: Medicare Other | Admitting: Gynecologic Oncology

## 2022-04-06 VITALS — BP 159/71 | HR 61 | Temp 98.2°F | Resp 16 | Ht 64.0 in | Wt 185.2 lb

## 2022-04-06 DIAGNOSIS — R600 Localized edema: Secondary | ICD-10-CM | POA: Diagnosis not present

## 2022-04-06 DIAGNOSIS — C541 Malignant neoplasm of endometrium: Secondary | ICD-10-CM | POA: Diagnosis not present

## 2022-04-06 NOTE — Patient Instructions (Signed)
It was good to see you today.  I do not see any evidence of cancer recurrence on your exam. ? ?I will see you back for follow-up in 6 months.  If you develop any new and concerning symptoms before then, please call to see me sooner. ? ?Since my schedule is not out past the early fall, please call back in September to get a visit scheduled with me for appropriately November. ?

## 2022-04-06 NOTE — Progress Notes (Signed)
Gynecologic Oncology Return Clinic Visit ? ?04/06/22 ? 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  ?Endometrial cancer ([place])  ?09/11/2021 Initial Biopsy  ? EMB: gr 3 endometrial cancer, favor endometrioid ?  ?09/22/2021 Initial Diagnosis  ? Endometrial cancer [facility]) ?  ?09/29/2021 Imaging  ? CT C/A/P: ?1. Evaluation of the pelvis is significantly limited by dense metallic streak artifact from adjacent hip arthroplasty. Within this limitation, there is expansile, masslike, heterogeneously enhancing appearance of the endometrium, measuring at least 5.2 cm in thickness. Findings are in keeping with reported diagnosis of endometrial malignancy. ?2. No evidence of lymphadenopathy or metastatic disease in the chest, abdomen, or pelvis. ?3. There is a 0.4 cm fissural nodule the superior segment left lower lobe, almost certainly a benign intrapulmonary lymph node. Attention on follow-up. ?4. Coronary artery disease. ?  ?10/14/2021 Surgery  ? TRH/BSO, right SLN biopsy, left pelvic and PA LND, peritoneal nodule biopsy, mini-lap for specimen delivery ? ?Findings: On EUA, 8cm bulbous uterus. ON intra-abdominal entry, normal upper abdominal survey. Normal omentum, small and large bowel. Uterus 8-10cm and bulbous. Normal appearing adnexa. Mapping successful on the right, no mapping on the left. Some mildly prominent lymph nodes bilaterally in the pelvis. Small, <5 mm, nodule in the cul de sac. No obvious intra-abdominal or pelvic evidence of disease. ?  ?10/14/2021 Pathology Results  ? 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  ?FINAL MICROSCOPIC DIAGNOSIS:  ? ?A. SENTINEL LYMPH  NODE, RIGHT EXTERNAL ILIAC, BIOPSY:  ?- Lymph node, negative for carcinoma (0/1)  ? ?B. SENTINEL LYMPH NODE, RIGHT OBTURATOR AND SURROUNDING LYMPH NODES,  ?BIOPSY:  ?- Lymph nodes, negative for carcinoma (0/5)  ? ?C. UTERUS, CERVIX, BILATERAL FALLOPIAN TUBES AND OVARIES:  ?- Invasive high-grade serous carcinoma  ?- Carcinoma focally invades into the serosal surface  ?- Lymphovascular invasion is present  ?- Benign unremarkable cervix  ?- Benign unremarkable bilateral fallopian tubes and ovaries  ?- See oncology table  ? ?D. CUL DE SAC NODULE, POSTERIOR, BIOPSY:  ?- Endometriosis  ? ?E. LYMPH NODE, LEFT PELVIC, BIOPSY:  ?- Lymph nodes, negative for carcinoma (0/3)  ? ?F. LYMPH NODE, LEFT PARAORTIC, BIOPSY:  ?- Lymph nodes, negative for carcinoma (0/5)  ? ?ONCOLOGY TABLE:  ? ?UTERUS, CARCINOMA OR CARCINOSARCOMA: Resection  ? ?Procedure: Total hysterectomy and bilateral salpingo-oophorectomy  ?Histologic Type: Serous carcinoma  ?Histologic Grade: High-grade  ?Myometrial Invasion:  ?     Depth of Myometrial Invasion (mm): 32 mm  ?     Myometrial Thickness (mm): 32 mm  ?     Percentage of Myometrial Invasion: 100%  ?Uterine Serosa Involvement: Present, focal  ?Cervical stromal Involvement: Not identified  ?Extent of involvement of other tissue/organs: Not identified  ?Peritoneal/Ascitic Fluid: Negative for carcinoma  ?Lymphovascular Invasion: Present  ?Regional Lymph Nodes:  ?     Pelvic Lymph Nodes Examined:  ?                                 6 Sentinel  ?  3 Non-sentinel  ?                                 9 Total  ?     Pelvic Lymph Nodes with Metastasis: 0  ?                         Macrometastasis: (>2.0 mm): 0  ?                         Micrometastasis: (>0.2 mm and < 2.0 mm): 0  ?                         Isolated Tumor Cells (<0.2 mm): 0  ?                         Laterality of Lymph Node with Tumor: Not  ?applicable  ?                         Extracapsular Extension: Not applicable   ?     Para-aortic Lymph Nodes Examined:  ?                                  0 Sentinel  ?                                  5 Non-sentinel  ?                                  5 Total  ?     Para-aortic Lymph Nodes with Metastasis: 0  ?                         Macrometastasis: (>2.0 mm): 0  ?                         Micrometastasis:  (>0.2 mm and < 2.0 mm): 0  ?                         Isolated Tumor Cells (<0.2 mm): 0  ?                         Laterality of Lymph Node with Tumor: Not  ?applicable  ?                         Extracapsular Extension: Not applicable  ?Distant Metastasis:  ?     Distant Site(s) Involved: Not applicable  ?Pathologic Stage Classification (pTNM, AJCC 8th Edition): pT3a, pN0  ?Ancillary Studies: MMR / MSI testing will be ordered  ?Representative Tumor Block: C4  ?Comment(s): Pancytokeratin was performed on the lymph nodes and is negative.  ? ?By immunohistochemistry, HER-2 is EQUIVOCAL (2+).  HER-2 by FISH is pending and will be reported in an addendum.  ? ?FLOURESCENCE IN-SITU HYBRIDIZATION RESULTS:  ? ?GROUP 1:  HER2 **POSITIVE**  ? ?On the tissue sample received from  this individual HER2 FISH was performed by a technologist and cell imaging and analysis on the [employer]View.  ? ?RATIO of HER2/CEN 17 SIGNALS: 2.50  ?AVERAGE HER2 COPY NUMBER PER CELL: 4.74  ? ?The ratio of HER2/CEN 17 result exceeds the cutoff value of >=2.0 and a copy number of HER2 signals exceeding the cutoff range of >=4.0 signals per cell.  Arch Pathol Lab Med 1:1, 2018.  ?  ?10/28/2021 Cancer Staging  ? Staging form: Corpus Uteri - Carcinoma and Carcinosarcoma, AJCC 8th Edition ?- Pathologic stage from 10/28/2021: Stage III (pT3, pN0, cM0) - Signed by Heath Lark, MD on 10/28/2021 ?Stage prefix: Initial diagnosis ? ?  ?11/07/2021 Procedure  ? Successful placement of a right internal jugular approach power injectable Port-A-Cath. The catheter is ready for immediate use. ?  ?11/11/2021 - 02/26/2022 Chemotherapy  ? Patient is  on Treatment Plan : UTERINE Carboplatin AUC 6 / Paclitaxel q21d  ? ?  ?  ? Genetic Testing  ? Negative genetic testing. No pathogenic variants identified on the [facility] Multi-Cancer+RNA panel. VUS in MET called c.3220A>G, VUS in MUTYh called c.1417G>A and VUS in POLE called c.596G>T identified. The report date is 01/20/2022. ? ?The Multi-Cancer Panel + RNA offered by [facility] includes sequencing and/or deletion duplication testing of the following 84 genes: AIP, ALK, APC, ATM, AXIN2,BAP1,  BARD1, BLM, BMPR1A, BRCA1, BRCA2, BRIP1, CASR, CDC73, CDH1, CDK4, CDKN1B, CDKN1C, CDKN2A (p14ARF), CDKN2A (p16INK4a), CEBPA, CHEK2, CTNNA1, DICER1, DIS3L2, EGFR (c.2369C>T, p.Thr790Met variant only), EPCAM (Deletion/duplication testing only), FH, FLCN, GATA2, GPC3, GREM1 (Promoter region deletion/duplication testing only), HOXB13 (c.251G>A, p.Gly84Glu), HRAS, KIT, MAX, MEN1, MET, MITF (c.952G>A, p.Glu318Lys variant only), MLH1, MSH2, MSH3, MSH6, MUTYH, NBN, NF1, NF2, NTHL1, PALB2, PDGFRA, PHOX2B, PMS2, POLD1, POLE, POT1, PRKAR1A, PTCH1, PTEN, RAD50, RAD51C, RAD51D, RB1, RECQL4, RET, RUNX1, SDHAF2, SDHA (sequence changes only), SDHB, SDHC, SDHD, SMAD4, SMARCA4, SMARCB1, SMARCE1, STK11, SUFU, TERC, TERT, TMEM127, TP53, TSC1, TSC2, VHL, WRN and WT1. ?  ?03/30/2022 Imaging  ? 1. Bandlike opacity in the medial left lung apex is progressive in the interval, now with nodular component measuring up to 10 mm. While likely related to evolving atelectasis or scar, consider follow-up CT chest in 3 months to reassess. ?2. Interval hysterectomy. ?3. No evidence for metastatic disease in the chest, abdomen, or pelvis. ?4. Trace free fluid in the upper pelvis, nonspecific. Of note, inferior pelvis is essentially obscured by beam hardening artifact from bilateral hip replacement. ?5. Left colonic diverticulosis without diverticulitis. ?6. Moderate stool volume. Imaging features could be compatible with constipation in the appropriate clinical  setting. ?7. Aortic Atherosclerosis (ICD10-I70.0). ?  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 IMPRESSION: ?1. Highly suspicious mass in the left breast at 1 o'

## 2022-04-09 ENCOUNTER — Encounter: Payer: Self-pay | Admitting: *Deleted

## 2022-05-04 ENCOUNTER — Encounter: Payer: Self-pay | Admitting: Hematology and Oncology

## 2022-05-04 ENCOUNTER — Inpatient Hospital Stay: Payer: Medicare Other | Attending: Gynecologic Oncology

## 2022-05-04 ENCOUNTER — Inpatient Hospital Stay (HOSPITAL_BASED_OUTPATIENT_CLINIC_OR_DEPARTMENT_OTHER): Payer: Medicare Other | Admitting: Hematology and Oncology

## 2022-05-04 ENCOUNTER — Other Ambulatory Visit: Payer: Self-pay

## 2022-05-04 VITALS — BP 146/55 | HR 57 | Temp 97.7°F | Resp 18 | Ht 64.0 in | Wt 181.4 lb

## 2022-05-04 DIAGNOSIS — C50412 Malignant neoplasm of upper-outer quadrant of left female breast: Secondary | ICD-10-CM | POA: Insufficient documentation

## 2022-05-04 DIAGNOSIS — D61818 Other pancytopenia: Secondary | ICD-10-CM

## 2022-05-04 DIAGNOSIS — R911 Solitary pulmonary nodule: Secondary | ICD-10-CM | POA: Diagnosis not present

## 2022-05-04 DIAGNOSIS — Z801 Family history of malignant neoplasm of trachea, bronchus and lung: Secondary | ICD-10-CM | POA: Diagnosis not present

## 2022-05-04 DIAGNOSIS — C541 Malignant neoplasm of endometrium: Secondary | ICD-10-CM

## 2022-05-04 LAB — CBC WITH DIFFERENTIAL (CANCER CENTER ONLY)
Abs Immature Granulocytes: 0.01 10*3/uL (ref 0.00–0.07)
Basophils Absolute: 0 10*3/uL (ref 0.0–0.1)
Basophils Relative: 0 %
Eosinophils Absolute: 0.2 10*3/uL (ref 0.0–0.5)
Eosinophils Relative: 5 %
HCT: 35.4 % — ABNORMAL LOW (ref 36.0–46.0)
Hemoglobin: 11.7 g/dL — ABNORMAL LOW (ref 12.0–15.0)
Immature Granulocytes: 0 %
Lymphocytes Relative: 30 %
Lymphs Abs: 1.1 10*3/uL (ref 0.7–4.0)
MCH: 31.6 pg (ref 26.0–34.0)
MCHC: 33.1 g/dL (ref 30.0–36.0)
MCV: 95.7 fL (ref 80.0–100.0)
Monocytes Absolute: 0.4 10*3/uL (ref 0.1–1.0)
Monocytes Relative: 12 %
Neutro Abs: 1.9 10*3/uL (ref 1.7–7.7)
Neutrophils Relative %: 53 %
Platelet Count: 166 10*3/uL (ref 150–400)
RBC: 3.7 MIL/uL — ABNORMAL LOW (ref 3.87–5.11)
RDW: 12.7 % (ref 11.5–15.5)
WBC Count: 3.7 10*3/uL — ABNORMAL LOW (ref 4.0–10.5)
nRBC: 0 % (ref 0.0–0.2)

## 2022-05-04 LAB — CMP (CANCER CENTER ONLY)
ALT: 15 U/L (ref 0–44)
AST: 22 U/L (ref 15–41)
Albumin: 3.8 g/dL (ref 3.5–5.0)
Alkaline Phosphatase: 75 U/L (ref 38–126)
Anion gap: 4 — ABNORMAL LOW (ref 5–15)
BUN: 14 mg/dL (ref 8–23)
CO2: 30 mmol/L (ref 22–32)
Calcium: 9.3 mg/dL (ref 8.9–10.3)
Chloride: 105 mmol/L (ref 98–111)
Creatinine: 0.94 mg/dL (ref 0.44–1.00)
GFR, Estimated: >60 mL/min (ref >60)
Glucose, Bld: 112 mg/dL — ABNORMAL HIGH (ref 70–99)
Potassium: 4.2 mmol/L (ref 3.5–5.1)
Sodium: 139 mmol/L (ref 135–145)
Total Bilirubin: 0.4 mg/dL (ref 0.3–1.2)
Total Protein: 6.3 g/dL — ABNORMAL LOW (ref 6.5–8.1)

## 2022-05-04 NOTE — Assessment & Plan Note (Signed)
She has mild intermittent hot flashes but not severe She will continue Arimidex When the patient is ready, she will discuss with her surgeon and arrange for lumpectomy

## 2022-05-04 NOTE — Progress Notes (Signed)
[place] OFFICE PROGRESS NOTE  Patient Care Team: [name], FNP as PCP - General (Nurse Practitioner)  ASSESSMENT & PLAN:  Endometrial cancer [facility]) She is still recovering from side effects of treatment Due to previous abnormal chest imaging, I plan to order CT imaging of the chest without contrast next month for further follow-up  Breast cancer of upper-outer quadrant of left female breast [facility]) She has mild intermittent hot flashes but not severe She will continue Arimidex When the patient is ready, she will discuss with her surgeon and arrange for lumpectomy  Pancytopenia, acquired ([place]) Her blood count is stable/improving Observe only  Orders Placed This Encounter  Procedures   CT Chest Wo Contrast    Standing Status:   Future    Standing Expiration Date:   05/04/2023    Order Specific Question:   Preferred imaging location?    Answer:   [facility]    All questions were answered. The patient knows to call the clinic with any problems, questions or concerns. The total time spent in the appointment was 20 minutes encounter with patients including review of chart and various tests results, discussions about plan of care and coordination of care plan   Heath Lark, MD 05/04/2022 12:34 PM  INTERVAL HISTORY: Please see below for problem oriented charting. she returns for treatment follow-up with her husband She has some mild intermittent hot flashes while on Arimidex but otherwise tolerated treatment well Her energy level and stamina is gradually improving, at approximately 60 to 70% of baseline  REVIEW OF SYSTEMS:   Constitutional: Denies fevers, chills or abnormal weight loss Eyes: Denies blurriness of vision Ears, nose, mouth, throat, and face: Denies mucositis or sore throat Respiratory: Denies cough, dyspnea or wheezes Cardiovascular: Denies palpitation, chest discomfort or lower extremity swelling Gastrointestinal:  Denies nausea,  heartburn or change in bowel habits Skin: Denies abnormal skin rashes Lymphatics: Denies new lymphadenopathy or easy bruising Neurological:Denies numbness, tingling or new weaknesses Behavioral/Psych: Mood is stable, no new changes  All other systems were reviewed with the patient and are negative.  I have reviewed the past medical history, past surgical history, social history and family history with the patient and they are unchanged from previous note.  ALLERGIES:  is allergic to lactose, codeine, penicillins, sulfa antibiotics, and vancomycin.  MEDICATIONS:  Current Outpatient Medications  Medication Sig Dispense Refill   anastrozole (ARIMIDEX) 1 MG tablet Take 1 tablet (1 mg total) by mouth daily. 90 tablet 1   Cholecalciferol 50 MCG (2000 UT) CAPS Take 2,000 Units by mouth daily.     losartan (COZAAR) 50 MG tablet Take 1 tablet by mouth daily.     magnesium oxide (MAG-OX) 400 MG tablet Take 200 mg by mouth 2 (two) times daily.     Multiple Vitamins-Minerals (MULTIVITAMIN WOMEN 50+ PO) Take 1 tablet by mouth daily.     Omega-3 1000 MG CAPS Take 1,000 mg by mouth daily.     ondansetron (ZOFRAN) 8 MG tablet Take 1 tablet (8 mg total) by mouth every 8 (eight) hours as needed. (Patient taking differently: Take 8 mg by mouth every 8 (eight) hours as needed for nausea or vomiting.) 30 tablet 1   pantoprazole (PROTONIX) 20 MG tablet Take 20 mg by mouth daily as needed for heartburn or indigestion.     prochlorperazine (COMPAZINE) 10 MG tablet      propranolol (INDERAL) 40 MG tablet Take 40 mg by mouth 2 (two) times daily.  rosuvastatin (CRESTOR) 5 MG tablet Take 5 mg by mouth every evening.     senna-docusate (SENOKOT-S) 8.6-50 MG tablet Take 2 tablets by mouth 2 (two) times daily. 60 tablet 0   Simethicone 125 MG TABS Take 250 mg by mouth 2 (two) times daily as needed (gas).     SYNTHROID 137 MCG tablet Take 137 mcg by mouth daily.     temazepam (RESTORIL) 15 MG capsule Take 15 mg by  mouth at bedtime as needed.     No current facility-administered medications for this visit.   Facility-Administered Medications Ordered in Other Visits  Medication Dose Route Frequency Provider Last Rate Last Admin   sodium chloride flush (NS) 0.9 % injection 10 mL  10 mL Intracatheter Once Heath Lark, MD        SUMMARY OF ONCOLOGIC HISTORY: Oncology History Overview Note  MMR IHC intact High grade serous  HER2 positive   Endometrial cancer ([place])  09/11/2021 Initial Biopsy   EMB: gr 3 endometrial cancer, favor endometrioid   09/22/2021 Initial Diagnosis   Endometrial cancer ([place])   09/29/2021 Imaging   CT C/A/P: 1. Evaluation of the pelvis is significantly limited by dense metallic streak artifact from adjacent hip arthroplasty. Within this limitation, there is expansile, masslike, heterogeneously enhancing appearance of the endometrium, measuring at least 5.2 cm in thickness. Findings are in keeping with reported diagnosis of endometrial malignancy. 2. No evidence of lymphadenopathy or metastatic disease in the chest, abdomen, or pelvis. 3. There is a 0.4 cm fissural nodule the superior segment left lower lobe, almost certainly a benign intrapulmonary lymph node. Attention on follow-up. 4. Coronary artery disease.   10/14/2021 Surgery   TRH/BSO, right SLN biopsy, left pelvic and PA LND, peritoneal nodule biopsy, mini-lap for specimen delivery  Findings: On EUA, 8cm bulbous uterus. ON intra-abdominal entry, normal upper abdominal survey. Normal omentum, small and large bowel. Uterus 8-10cm and bulbous. Normal appearing adnexa. Mapping successful on the right, no mapping on the left. Some mildly prominent lymph nodes bilaterally in the pelvis. Small, <5 mm, nodule in the cul de sac. No obvious intra-abdominal or pelvic evidence of disease.   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 SENTINEL LYMPH NODE, RIGHT EXTERNAL ILIAC, BIOPSY:  - Lymph node, negative for carcinoma (0/1)   B. SENTINEL LYMPH NODE, RIGHT OBTURATOR AND SURROUNDING LYMPH NODES,  BIOPSY:  - Lymph nodes, negative for carcinoma (0/5)   C. UTERUS, CERVIX, BILATERAL FALLOPIAN TUBES AND OVARIES:  - Invasive high-grade serous carcinoma  - Carcinoma focally invades into the serosal surface  - Lymphovascular invasion is present  - Benign unremarkable cervix  - Benign unremarkable bilateral fallopian tubes and ovaries  - See oncology table   D. CUL DE SAC NODULE, POSTERIOR, BIOPSY:  - Endometriosis   E. LYMPH NODE, LEFT PELVIC, BIOPSY:  - Lymph nodes, negative for carcinoma (0/3)   F. LYMPH NODE, LEFT PARAORTIC, BIOPSY:  - Lymph nodes, negative for carcinoma (0/5)   ONCOLOGY TABLE:   UTERUS, CARCINOMA OR CARCINOSARCOMA: Resection   Procedure: Total hysterectomy and bilateral salpingo-oophorectomy  Histologic Type: Serous carcinoma  Histologic Grade: High-grade  Myometrial Invasion:       Depth of Myometrial Invasion (mm): 32 mm       Myometrial  Thickness (mm): 32 mm       Percentage of Myometrial Invasion: 100%  Uterine Serosa Involvement: Present, focal  Cervical stromal Involvement: Not identified  Extent of involvement of other tissue/organs: Not identified  Peritoneal/Ascitic Fluid: Negative for carcinoma  Lymphovascular Invasion: Present  Regional Lymph Nodes:       Pelvic Lymph Nodes Examined:                                   6 Sentinel                                   3 Non-sentinel                                   9 Total       Pelvic Lymph Nodes with Metastasis: 0                           Macrometastasis: (>2.0 mm): 0                           Micrometastasis: (>0.2 mm and < 2.0 mm): 0                           Isolated Tumor Cells  (<0.2 mm): 0                           Laterality of Lymph Node with Tumor: Not  applicable                           Extracapsular Extension: Not applicable       Para-aortic Lymph Nodes Examined:                                    0 Sentinel                                    5 Non-sentinel                                    5 Total       Para-aortic Lymph Nodes with Metastasis: 0                           Macrometastasis: (>2.0 mm): 0                           Micrometastasis:  (>0.2 mm and < 2.0 mm): 0                           Isolated Tumor Cells (<0.2 mm): 0                           Laterality of  Lymph Node with Tumor: Not  applicable                           Extracapsular Extension: Not applicable  Distant Metastasis:       Distant Site(s) Involved: Not applicable  Pathologic Stage Classification (pTNM, AJCC 8th Edition): pT3a, pN0  Ancillary Studies: MMR / MSI testing will be ordered  Representative Tumor Block: C4  Comment(s): Pancytokeratin was performed on the lymph nodes and is negative.   By immunohistochemistry, HER-2 is EQUIVOCAL (2+).  HER-2 by FISH is pending and will be reported in an addendum.   FLOURESCENCE IN-SITU HYBRIDIZATION RESULTS:   GROUP 1:  HER2 **POSITIVE**   On the tissue sample received from this individual HER2 FISH was performed by a technologist and cell imaging and analysis on the BioView.   RATIO of HER2/CEN 17 SIGNALS: 2.50  AVERAGE HER2 COPY NUMBER PER CELL: 4.74   The ratio of HER2/CEN 17 result exceeds the cutoff value of >=2.0 and a copy number of HER2 signals exceeding the cutoff range of >=4.0 signals per cell.  Arch Pathol Lab Med 1:1, 2018.    10/28/2021 Cancer Staging   Staging form: Corpus Uteri - Carcinoma and Carcinosarcoma, AJCC 8th Edition - Pathologic stage from 10/28/2021: Stage III (pT3, pN0, cM0) - Signed by Heath Lark, MD on 10/28/2021 Stage prefix: Initial diagnosis    11/07/2021 Procedure   Successful placement of  a right internal jugular approach power injectable Port-A-Cath. The catheter is ready for immediate use.   11/11/2021 - 02/26/2022 Chemotherapy   Patient is on Treatment Plan : UTERINE Carboplatin AUC 6 / Paclitaxel q21d       Genetic Testing   Negative genetic testing. No pathogenic variants identified on the [facility] Multi-Cancer+RNA panel. VUS in MET called c.3220A>G, VUS in MUTYh called c.1417G>A and VUS in POLE called c.596G>T identified. The report date is 01/20/2022.  The Multi-Cancer Panel + RNA offered by [facility] includes sequencing and/or deletion duplication testing of the following 84 genes: AIP, ALK, APC, ATM, AXIN2,BAP1,  BARD1, BLM, BMPR1A, BRCA1, BRCA2, BRIP1, CASR, CDC73, CDH1, CDK4, CDKN1B, CDKN1C, CDKN2A (p14ARF), CDKN2A (p16INK4a), CEBPA, CHEK2, CTNNA1, DICER1, DIS3L2, EGFR (c.2369C>T, p.Thr790Met variant only), EPCAM (Deletion/duplication testing only), FH, FLCN, GATA2, GPC3, GREM1 (Promoter region deletion/duplication testing only), HOXB13 (c.251G>A, p.Gly84Glu), HRAS, KIT, MAX, MEN1, MET, MITF (c.952G>A, p.Glu318Lys variant only), MLH1, MSH2, MSH3, MSH6, MUTYH, NBN, NF1, NF2, NTHL1, PALB2, PDGFRA, PHOX2B, PMS2, POLD1, POLE, POT1, PRKAR1A, PTCH1, PTEN, RAD50, RAD51C, RAD51D, RB1, RECQL4, RET, RUNX1, SDHAF2, SDHA (sequence changes only), SDHB, SDHC, SDHD, SMAD4, SMARCA4, SMARCB1, SMARCE1, STK11, SUFU, TERC, TERT, TMEM127, TP53, TSC1, TSC2, VHL, WRN and WT1.   03/30/2022 Imaging   1. Bandlike opacity in the medial left lung apex is progressive in the interval, now with nodular component measuring up to 10 mm. While likely related to evolving atelectasis or scar, consider follow-up CT chest in 3 months to reassess. 2. Interval hysterectomy. 3. No evidence for metastatic disease in the chest, abdomen, or pelvis. 4. Trace free fluid in the upper pelvis, nonspecific. Of note, inferior pelvis is essentially obscured by beam hardening artifact from bilateral hip replacement. 5. Left colonic  diverticulosis without diverticulitis. 6. Moderate stool volume. Imaging features could be compatible with constipation in the appropriate clinical setting. 7. Aortic Atherosclerosis (ICD10-I70.0).   Breast cancer of upper-outer quadrant of left female breast ([place])  11/19/2021 Imaging   IMPRESSION: 1. Highly suspicious mass in the left breast at  1 o'clock measuring 1.9 cm.   2.  No mammographic evidence of malignancy in the right breast   11/27/2021 Procedure   Appropriate positioning of the venous shaped biopsy marking clip at the site of biopsy in the 1 o'clock location of the LEFT breast.   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 No pathogenic variants identified on the [facility] Multi-Cancer+RNA panel. VUS in MET called c.3220A>G, VUS in MUTYh called c.1417G>A and VUS in POLE called c.596G>T identified. The report date is 01/20/2022.  The Multi-Cancer Panel + RNA offered by [facility] includes sequencing and/or deletion duplication testing of the following 84 genes: AIP, ALK, APC, ATM, AXIN2,BAP1,  BARD1, BLM, BMPR1A, BRCA1, BRCA2, BRIP1, CASR, CDC73, CDH1, CDK4, CDKN1B, CDKN1C, CDKN2A (p14ARF), CDKN2A (p16INK4a), CEBPA, CHEK2, CTNNA1, DICER1, DIS3L2, EGFR (c.2369C>T, p.Thr790Met variant only), EPCAM (Deletion/duplication testing only), FH, FLCN, GATA2, GPC3, GREM1 (Promoter region deletion/duplication testing only), HOXB13 (c.251G>A, p.Gly84Glu), HRAS, KIT, MAX, MEN1, MET, MITF (c.952G>A, p.Glu318Lys variant only), MLH1, MSH2, MSH3, MSH6, MUTYH, NBN, NF1, NF2, NTHL1, PALB2, PDGFRA, PHOX2B, PMS2, POLD1, POLE, POT1, PRKAR1A, PTCH1, PTEN, RAD50, RAD51C, RAD51D, RB1, RECQL4, RET, RUNX1, SDHAF2, SDHA (sequence  changes only), SDHB, SDHC, SDHD, SMAD4, SMARCA4, SMARCB1, SMARCE1, STK11, SUFU, TERC, TERT, TMEM127, TP53, TSC1, TSC2, VHL, WRN and WT1.   03/30/2022 -  Anti-estrogen oral therapy   She is started on Arimidex     PHYSICAL EXAMINATION: ECOG PERFORMANCE STATUS: 1 - Symptomatic but completely ambulatory  Vitals:   05/04/22 1106  BP: (!) 146/55  Pulse: (!) 57  Resp: 18  Temp: 97.7 F (36.5 C)  SpO2: 98%   Filed Weights   05/04/22 1106  Weight: 181 lb 6.4 oz (82.3 kg)    GENERAL:alert, no distress and comfortable NEURO: alert & oriented x 3 with fluent speech, no focal motor/sensory deficits  LABORATORY DATA:  I have reviewed the data as listed    Component Value Date/Time   NA 139 05/04/2022 1044   K 4.2 05/04/2022 1044   CL 105 05/04/2022 1044   CO2 30 05/04/2022 1044   GLUCOSE 112 (H) 05/04/2022 1044   BUN 14 05/04/2022 1044   CREATININE 0.94 05/04/2022 1044   CALCIUM 9.3 05/04/2022 1044   PROT 6.3 (L) 05/04/2022 1044   ALBUMIN 3.8 05/04/2022 1044   AST 22 05/04/2022 1044   ALT 15 05/04/2022 1044   ALKPHOS 75 05/04/2022 1044   BILITOT 0.4 05/04/2022 1044   GFRNONAA >60 05/04/2022 1044    No results found for: SPEP, UPEP  Lab Results  Component Value Date   WBC 3.7 (L) 05/04/2022   NEUTROABS 1.9 05/04/2022   HGB 11.7 (L) 05/04/2022   HCT 35.4 (L) 05/04/2022   MCV 95.7 05/04/2022   PLT 166 05/04/2022      Chemistry      Component Value Date/Time   NA 139 05/04/2022 1044   K 4.2 05/04/2022 1044   CL 105 05/04/2022 1044   CO2 30 05/04/2022 1044   BUN 14 05/04/2022 1044   CREATININE 0.94 05/04/2022 1044      Component Value Date/Time   CALCIUM 9.3 05/04/2022 1044   ALKPHOS 75 05/04/2022 1044   AST 22 05/04/2022 1044   ALT  15 05/04/2022 1044   BILITOT 0.4 05/04/2022 1044

## 2022-05-04 NOTE — Assessment & Plan Note (Signed)
She is still recovering from side effects of treatment Due to previous abnormal chest imaging, I plan to order CT imaging of the chest without contrast next month for further follow-up

## 2022-05-04 NOTE — Assessment & Plan Note (Signed)
Her blood count is stable/improving Observe only

## 2022-05-06 ENCOUNTER — Ambulatory Visit (INDEPENDENT_AMBULATORY_CARE_PROVIDER_SITE_OTHER): Payer: Medicare Other | Admitting: Internal Medicine

## 2022-05-06 ENCOUNTER — Encounter: Payer: Self-pay | Admitting: Internal Medicine

## 2022-05-06 ENCOUNTER — Other Ambulatory Visit: Payer: Self-pay

## 2022-05-06 VITALS — BP 154/84 | HR 65 | Resp 16 | Ht 64.0 in | Wt 180.4 lb

## 2022-05-06 DIAGNOSIS — T80219S Unspecified infection due to central venous catheter, sequela: Secondary | ICD-10-CM

## 2022-05-06 NOTE — Progress Notes (Signed)
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   CHOLECALCIFEROL 50 MCG (2000 UT) CAPS    Take 2,000 Units by mouth daily.   LOSARTAN (COZAAR) 50 MG TABLET    Take 1 tablet by mouth daily.   MAGNESIUM OXIDE (MAG-OX) 400 MG TABLET    Take 200 mg by mouth 2 (two) times daily.   MULTIPLE VITAMINS-MINERALS (MULTIVITAMIN WOMEN 50+ PO)    Take 1 tablet by mouth daily.   OMEGA-3 1000 MG CAPS    Take 1,000 mg by mouth daily.   ONDANSETRON (ZOFRAN) 8 MG TABLET    Take 1 tablet (8 mg total) by mouth every 8 (eight) hours as needed.   PANTOPRAZOLE (PROTONIX) 20 MG TABLET    Take 20 mg by mouth daily as needed for heartburn or indigestion.   PROCHLORPERAZINE (COMPAZINE) 10 MG TABLET       PROPRANOLOL (INDERAL) 40 MG TABLET    Take 40 mg by mouth 2 (two) times daily.   ROSUVASTATIN (CRESTOR) 5 MG TABLET    Take 5 mg by mouth every evening.   SENNA-DOCUSATE (SENOKOT-S) 8.6-50 MG TABLET    Take 2 tablets by mouth 2 (two) times daily.   SIMETHICONE 125 MG TABS    Take 250 mg by mouth 2 (two) times daily as needed (gas).   SYNTHROID 137 MCG TABLET    Take 137 mcg by mouth daily.   TEMAZEPAM (RESTORIL) 15 MG  CAPSULE    Take 15 mg by mouth at bedtime as needed.  Modified Medications   No medications on file  Discontinued Medications   No medications on file    Subjective: Kristin Carlson is a 81 y.o. F with PMHx(including endometrial Cx.) as below presents for hospital follow-up of port site infection. She was admitted to [facility] 2/6-2/9 with port site infection.  Left chest port initially place on 11/07/21. On 12/23, she received doxy+ keflex(x7d) for port site infection, followed by doxy x 10d. She had interim improvement of port site erythema.  On 2/2 started on doxy x 10d for continued redness/drainage at port site. She continued to have drainage from her port on doxy as such admitted for further evaluation and IV antibiotics(received vanomcyin and cefepime). Pt reports port has been accessed once in December for infusion. Then accessed again for labs. Underwent port removal on 2/7, minimal infected material noted in pocket, no Cx obtained. She was transition to doxy+ cipro to complete 2 weeks of antibiotics form port removal 01/30/22: she reports feeling well. The port site is healing well. She reported  adherence to her antibiotics which completed on 2/20. PIV used for infusions  05/06/22: No concerns at port site, esxept for mild itching. Denies fever, chills.  Review of Systems  All other systems reviewed and are negative.  Micro: Antibiotics: Vancomycin a2/6-2/8 meropenem 2/6-2/7 Cefepime 2/7-2/8 Doxy+ cipro 2/8-2/20  Review of Systems: Review of Systems  All other systems reviewed and are negative.   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 atypical basaloid cells   Family history of lung cancer    GERD (gastroesophageal reflux disease)    History of basal cell carcinoma 07/29/2021   left thigh, EDC at  time of bx   History of radiation therapy    vaginal brachytherapy [place] 12/18/2021-01/13/2022 Dr Gery Pray   HLD (hyperlipidemia)    Hypertension    Hypothyroidism    Pneumonia    HX OF YEARS AGO   PONV (postoperative nausea and vomiting)     Social History   Tobacco Use   Smoking status: Former    Types: Cigarettes   Smokeless tobacco: Never  Vaping Use   Vaping Use: Never used  Substance Use Topics   Alcohol use: Yes    Comment: RARE   Drug use: Never    Family History  Problem Relation Age of Onset   Cancer Father        Lung Cancer   Cancer Paternal Aunt        mouth   Skin cancer Maternal Grandmother    Skin cancer Paternal Grandmother    Colon cancer Neg Hx    Breast cancer Neg Hx    Ovarian cancer Neg Hx    Endometrial cancer Neg Hx    Pancreatic cancer Neg Hx    Prostate cancer Neg Hx     Allergies  Allergen Reactions   Lactose Other (See Comments)    Gi- Upset   Codeine Nausea And Vomiting   Penicillins Rash   Sulfa Antibiotics Rash   Vancomycin Itching and Rash    Health Maintenance  Topic Date Due   Hepatitis C Screening  Never done   DEXA SCAN  Never done   COVID-19 Vaccine (3 - Mixed Product risk series) 03/29/2020   INFLUENZA VACCINE  06/30/2022   TETANUS/TDAP  06/25/2029   Pneumonia Vaccine 68+ Years old  Completed   Zoster Vaccines- Shingrix  Completed   HPV VACCINES  Aged Out    Objective:  Vitals:   05/06/22 1449  BP: (!) 154/84  Pulse: 65  Resp: 16  SpO2: 100%  Weight: 180 lb 6.4 oz (81.8 kg)  Height: '5\' 4"'$  (1.626 m)   Body mass index is 30.97 kg/m.  Physical Exam Constitutional:      Appearance: Normal appearance.  HENT:     Head: Normocephalic and atraumatic.     Right Ear: Tympanic membrane normal.     Left Ear: Tympanic membrane normal.     Nose: Nose normal.     Mouth/Throat:     Mouth: Mucous membranes are moist.  Eyes:     Extraocular Movements: Extraocular movements intact.     Conjunctiva/sclera:  Conjunctivae normal.     Pupils: Pupils are equal, round, and reactive to light.  Cardiovascular:     Rate and Rhythm: Normal rate and regular rhythm.     Heart sounds: No murmur heard.    No friction rub.  No gallop.  Pulmonary:     Effort: Pulmonary effort is normal.     Breath sounds: Normal breath sounds.  Abdominal:     General: Abdomen is flat.     Palpations: Abdomen is soft.  Musculoskeletal:        General: Normal range of motion.  Skin:    General: Skin is warm and dry.     Comments: Left chest old port site without tenderness/erythema  Neurological:     General: No focal deficit present.     Mental Status: She is alert and oriented to person, place, and time.  Psychiatric:        Mood and Affect: Mood normal.     Lab Results Lab Results  Component Value Date   WBC 3.7 (L) 05/04/2022   HGB 11.7 (L) 05/04/2022   HCT 35.4 (L) 05/04/2022   MCV 95.7 05/04/2022   PLT 166 05/04/2022    Lab Results  Component Value Date   CREATININE 0.94 05/04/2022   BUN 14 05/04/2022   NA 139 05/04/2022   K 4.2 05/04/2022   CL 105 05/04/2022   CO2 30 05/04/2022    Lab Results  Component Value Date   ALT 15 05/04/2022   AST 22 05/04/2022   ALKPHOS 75 05/04/2022   BILITOT 0.4 05/04/2022    No results found for: CHOL, HDL, LDLCALC, LDLDIRECT, TRIG, CHOLHDL No results found for: LABRPR, RPRTITER No results found for: HIV1RNAQUANT, HIV1RNAVL, CD4TABS A/P #Central line infection -SP 2 weeks of antibiotics completed  with doxy and cipro EOT 01/19/22 from port removal -Wound appears to have healed -Follow-up PRN   Laurice Record, MD [facility] 05/06/2022, 2:58 PM

## 2022-05-28 ENCOUNTER — Ambulatory Visit (INDEPENDENT_AMBULATORY_CARE_PROVIDER_SITE_OTHER): Payer: Medicare Other | Admitting: Dermatology

## 2022-05-28 DIAGNOSIS — L814 Other melanin hyperpigmentation: Secondary | ICD-10-CM

## 2022-05-28 DIAGNOSIS — L821 Other seborrheic keratosis: Secondary | ICD-10-CM

## 2022-05-28 DIAGNOSIS — D1800 Hemangioma unspecified site: Secondary | ICD-10-CM

## 2022-05-28 DIAGNOSIS — D229 Melanocytic nevi, unspecified: Secondary | ICD-10-CM

## 2022-05-28 DIAGNOSIS — L910 Hypertrophic scar: Secondary | ICD-10-CM

## 2022-05-28 DIAGNOSIS — L578 Other skin changes due to chronic exposure to nonionizing radiation: Secondary | ICD-10-CM

## 2022-05-28 DIAGNOSIS — L570 Actinic keratosis: Secondary | ICD-10-CM

## 2022-05-28 DIAGNOSIS — Z1283 Encounter for screening for malignant neoplasm of skin: Secondary | ICD-10-CM | POA: Diagnosis not present

## 2022-05-28 DIAGNOSIS — Z85828 Personal history of other malignant neoplasm of skin: Secondary | ICD-10-CM

## 2022-05-28 NOTE — Progress Notes (Signed)
Follow-Up Visit   Subjective  Kristin Carlson is a 81 y.o. female who presents for the following: Annual Exam (6 mth tbse. Hx of isks, aks, . Patient reports a spot at right shoulder and left forearm did itched and got scaly . Noticed 3 - 4 month ago. Right forehead that noticed in last few days. ).  The patient presents for Total-Body Skin Exam (TBSE) for skin cancer screening and mole check.  The patient has spots, moles and lesions to be evaluated, some may be new or changing and the patient has concerns that these could be cancer.   The following portions of the chart were reviewed this encounter and updated as appropriate:  Tobacco  Allergies  Meds  Problems  Med Hx  Surg Hx  Fam Hx      Review of Systems: No other skin or systemic complaints except as noted in HPI or Assessment and Plan.   Objective  Well appearing patient in no apparent distress; mood and affect are within normal limits.  A full examination was performed including scalp, head, eyes, ears, nose, lips, neck, chest, axillae, abdomen, back, buttocks, bilateral upper extremities, bilateral lower extremities, hands, feet, fingers, toes, fingernails, and toenails. All findings within normal limits unless otherwise noted below.  right superior shoulder x 1, left superior shoulder x 1 (2) Erythematous thin papules/macules with gritty scale.   right chest Thickened scar     Assessment & Plan  Actinic keratosis (2) right superior shoulder x 1, left superior shoulder x 1  Actinic keratoses are precancerous spots that appear secondary to cumulative UV radiation exposure/sun exposure over time. They are chronic with expected duration over 1 year. A portion of actinic keratoses will progress to squamous cell carcinoma of the skin. It is not possible to reliably predict which spots will progress to skin cancer and so treatment is recommended to prevent development of skin cancer.  Recommend daily broad spectrum  sunscreen SPF 30+ to sun-exposed areas, reapply every 2 hours as needed.  Recommend staying in the shade or wearing long sleeves, sun glasses (UVA+UVB protection) and wide brim hats (4-inch brim around the entire circumference of the hat). Call for new or changing lesions.  Destruction of lesion - right superior shoulder x 1, left superior shoulder x 1  Destruction method: cryotherapy   Informed consent: discussed and consent obtained   Lesion destroyed using liquid nitrogen: Yes   Cryotherapy cycles:  2 Outcome: patient tolerated procedure well with no complications   Post-procedure details: wound care instructions given   Additional details:  Prior to procedure, discussed risks of blister formation, small wound, skin dyspigmentation, or rare scar following cryotherapy. Recommend Vaseline ointment to treated areas while healing.   Hypertrophic scar right chest  At portocath site  Discussed ILK injection could help to thin out scar and relieve discomfort  Discussed injecting steriod at later time after patient finishes cancer treatments.    Lentigines - Scattered tan macules - Due to sun exposure - Benign-appearing, observe - Recommend daily broad spectrum sunscreen SPF 30+ to sun-exposed areas, reapply every 2 hours as needed. - Call for any changes  Seborrheic Keratoses - Stuck-on, waxy, tan-brown papules and/or plaques at left forearm  - Benign-appearing - Discussed benign etiology and prognosis. - Observe - Call for any changes  Melanocytic Nevi - Tan-brown and/or pink-flesh-colored symmetric macules and papules - Benign appearing on exam today - Observation - Call clinic for new or changing moles - Recommend daily use of  broad spectrum spf 30+ sunscreen to sun-exposed areas.   Hemangiomas - Red papules - Discussed benign nature - Observe - Call for any changes  Actinic Damage - Chronic condition, secondary to cumulative UV/sun exposure - diffuse scaly  erythematous macules with underlying dyspigmentation - Recommend daily broad spectrum sunscreen SPF 30+ to sun-exposed areas, reapply every 2 hours as needed.  - Staying in the shade or wearing long sleeves, sun glasses (UVA+UVB protection) and wide brim hats (4-inch brim around the entire circumference of the hat) are also recommended for sun protection.  - Call for new or changing lesions.  History of Basal Cell Carcinoma of the Skin at multiple locations see history  - No evidence of recurrence today - Recommend regular full body skin exams - Recommend daily broad spectrum sunscreen SPF 30+ to sun-exposed areas, reapply every 2 hours as needed.  - Call if any new or changing lesions are noted between office visits  Skin cancer screening performed today. Return in about 6 months (around 11/27/2022) for TBSE. I, Ruthell Rummage, CMA, am acting as scribe for Forest Gleason, MD.  Documentation: I have reviewed the above documentation for accuracy and completeness, and I agree with the above.  Forest Gleason, MD

## 2022-05-28 NOTE — Patient Instructions (Addendum)
Actinic keratoses are precancerous spots that appear secondary to cumulative UV radiation exposure/sun exposure over time. They are chronic with expected duration over 1 year. A portion of actinic keratoses will progress to squamous cell carcinoma of the skin. It is not possible to reliably predict which spots will progress to skin cancer and so treatment is recommended to prevent development of skin cancer.  Recommend daily broad spectrum sunscreen SPF 30+ to sun-exposed areas, reapply every 2 hours as needed.  Recommend staying in the shade or wearing long sleeves, sun glasses (UVA+UVB protection) and wide brim hats (4-inch brim around the entire circumference of the hat). Call for new or changing lesions.   Recommend taking Heliocare sun protection supplement daily in sunny weather for additional sun protection. For maximum protection on the sunniest days, you can take up to 2 capsules of regular Heliocare OR take 1 capsule of Heliocare Ultra. For prolonged exposure (such as a full day in the sun), you can repeat your dose of the supplement 4 hours after your first dose. Heliocare can be purchased at [employer], at some [facility] or at [email or web address].    Melanoma ABCDEs  Melanoma is the most dangerous type of skin cancer, and is the leading cause of death from skin disease.  You are more likely to develop melanoma if you: Have light-colored skin, light-colored eyes, or red or blond hair Spend a lot of time in the sun Tan regularly, either outdoors or in a tanning bed Have had blistering sunburns, especially during childhood Have a close family member who has had a melanoma Have atypical moles or large birthmarks  Early detection of melanoma is key since treatment is typically straightforward and cure rates are extremely high if we catch it early.   The first sign of melanoma is often a change in a mole or a new dark spot.  The ABCDE system is a way of remembering the signs of  melanoma.  A for asymmetry:  The two halves do not match. B for border:  The edges of the growth are irregular. C for color:  A mixture of colors are present instead of an even brown color. D for diameter:  Melanomas are usually (but not always) greater than 80m - the size of a pencil eraser. E for evolution:  The spot keeps changing in size, shape, and color.  Please check your skin once per month between visits. You can use a small mirror in front and a large mirror behind you to keep an eye on the back side or your body.   If you see any new or changing lesions before your next follow-up, please call to schedule a visit.  Please continue daily skin protection including broad spectrum sunscreen SPF 30+ to sun-exposed areas, reapplying every 2 hours as needed when you're outdoors.   Staying in the shade or wearing long sleeves, sun glasses (UVA+UVB protection) and wide brim hats (4-inch brim around the entire circumference of the hat) are also recommended for sun protection.    Due to recent changes in healthcare laws, you may see results of your pathology and/or laboratory studies on MyChart before the doctors have had a chance to review them. We understand that in some cases there may be results that are confusing or concerning to you. Please understand that not all results are received at the same time and often the doctors may need to interpret multiple results in order to provide you with the best plan of care or  course of treatment. Therefore, we ask that you please give [place] 2 business days to thoroughly review all your results before contacting the office for clarification. Should we see a critical lab result, you will be contacted sooner.   If You Need Anything After Your Visit  If you have any questions or concerns for your doctor, please call our main line at [phone] and press option 4 to reach your doctor's medical assistant. If no one answers, please leave a voicemail as  directed and we will return your call as soon as possible. Messages left after 4 pm will be answered the following business day.   You may also send [place] a message via [place]. We typically respond to MyChart messages within 1-2 business days.  For prescription refills, please ask your pharmacy to contact our office. Our fax number is [phone].  If you have an urgent issue when the clinic is closed that cannot wait until the next business day, you can page your doctor at the number below.    Please note that while we do our best to be available for urgent issues outside of office hours, we are not available 24/7.   If you have an urgent issue and are unable to reach [place], you may choose to seek medical care at your doctor's office, retail clinic, urgent care center, or emergency room.  If you have a medical emergency, please immediately call 911 or go to the emergency department.  Pager Numbers  - Dr. Nehemiah Massed: [phone]  - Dr. Laurence Ferrari: [phone]  - Dr. Nicole Kindred: [phone]  In the event of inclement weather, please call our main line at [phone] for an update on the status of any delays or closures.  Dermatology Medication Tips: Please keep the boxes that topical medications come in in order to help keep track of the instructions about where and how to use these. Pharmacies typically print the medication instructions only on the boxes and not directly on the medication tubes.   If your medication is too expensive, please contact our office at [phone] option 4 or send [place] a message through [place].   We are unable to tell what your co-pay for medications will be in advance as this is different depending on your insurance coverage. However, we may be able to find a substitute medication at lower cost or fill out paperwork to get insurance to cover a needed medication.   If a prior authorization is required to get your medication covered by your insurance company, please  allow [place] 1-2 business days to complete this process.  Drug prices often vary depending on where the prescription is filled and some pharmacies may offer cheaper prices.  The website www.goodrx.com contains coupons for medications through different pharmacies. The prices here do not account for what the cost may be with help from insurance (it may be cheaper with your insurance), but the website can give you the price if you did not use any insurance.  - You can print the associated coupon and take it with your prescription to the pharmacy.  - You may also stop by our office during regular business hours and pick up a GoodRx coupon card.  - If you need your prescription sent electronically to a different pharmacy, notify our office through [facility] or by phone at [phone] option 4.     Si Usted Necesita Algo Despus de Su Visita  Tambin puede enviarnos un mensaje a travs de Pharmacist, community. Por lo general respondemos a  los mensajes de MyChart en el transcurso de 1 a 2 das hbiles.  Para renovar recetas, por favor pida a su farmacia que se ponga en contacto con nuestra oficina. Harland Dingwall de fax es [place] [phone].  Si tiene un asunto urgente cuando la clnica est cerrada y que no puede esperar hasta el siguiente da hbil, puede llamar/localizar a su doctor(a) al nmero que aparece a continuacin.   Por favor, tenga en cuenta que aunque hacemos todo lo posible para estar disponibles para asuntos urgentes fuera del horario de [place], no estamos disponibles las 24 horas del da, los 7 das de la [place].   Si tiene un problema urgente y no puede comunicarse con nosotros, puede optar por buscar atencin mdica  en el consultorio de su doctor(a), en una clnica privada, en un centro de atencin urgente o en una sala de emergencias.  Si tiene Engineering geologist, por favor llame inmediatamente al 911 o vaya a la sala de emergencias.  Nmeros de bper  - Dr. Nehemiah Massed:  [phone]  - Dra. Moye: [phone]  - Dra. Nicole Kindred: [phone]  En caso de inclemencias del [place], por favor llame a Johnsie Kindred principal al [phone] para una actualizacin sobre el [place] de cualquier retraso o cierre.  Consejos para la medicacin en dermatologa: Por favor, guarde las cajas en las que vienen los medicamentos de uso tpico para ayudarle a seguir las instrucciones sobre dnde y cmo usarlos. Las farmacias generalmente imprimen las instrucciones del medicamento slo en las cajas y no directamente en los tubos del [place].   Si su medicamento es muy caro, por favor, pngase en contacto con Zigmund Daniel llamando al [phone] y presione la opcin 4 o envenos un mensaje a travs de Pharmacist, community.   No podemos decirle cul ser su copago por los medicamentos por adelantado ya que esto es diferente dependiendo de la cobertura de su seguro. Sin embargo, es posible que podamos encontrar un medicamento sustituto a Electrical engineer un formulario para que el seguro cubra el medicamento que se considera necesario.   Si se requiere una autorizacin previa para que su compaa de seguros [place] su medicamento, por favor permtanos de 1 a 2 das hbiles para completar este proceso.  Los precios de los medicamentos varan con frecuencia dependiendo del Environmental consultant de dnde se surte la receta y alguna farmacias pueden ofrecer precios ms baratos.  El sitio web www.goodrx.com tiene cupones para medicamentos de Airline pilot. Los precios aqu no tienen en cuenta lo que podra costar con la ayuda del seguro (puede ser ms barato con su seguro), pero el sitio web puede darle el precio si no utiliz Research scientist (physical sciences).  - Puede imprimir el cupn correspondiente y llevarlo con su receta a la farmacia.  - Tambin puede pasar por nuestra oficina durante el horario de atencin regular y Charity fundraiser una tarjeta de cupones de GoodRx.  - Si necesita que su receta se enve electrnicamente a  una farmacia diferente, informe a nuestra oficina a travs de MyChart de [place] o por telfono llamando al [phone] y presione la opcin 4.

## 2022-05-29 ENCOUNTER — Other Ambulatory Visit: Payer: Self-pay | Admitting: General Surgery

## 2022-05-29 DIAGNOSIS — C50412 Malignant neoplasm of upper-outer quadrant of left female breast: Secondary | ICD-10-CM

## 2022-05-29 DIAGNOSIS — Z170 Estrogen receptor positive status [ER+]: Secondary | ICD-10-CM

## 2022-06-01 ENCOUNTER — Other Ambulatory Visit: Payer: Self-pay | Admitting: General Surgery

## 2022-06-01 DIAGNOSIS — Z170 Estrogen receptor positive status [ER+]: Secondary | ICD-10-CM

## 2022-06-02 ENCOUNTER — Encounter: Payer: Self-pay | Admitting: Dermatology

## 2022-06-03 ENCOUNTER — Encounter: Payer: Self-pay | Admitting: *Deleted

## 2022-06-04 ENCOUNTER — Other Ambulatory Visit: Payer: Self-pay

## 2022-06-15 ENCOUNTER — Ambulatory Visit
Admission: RE | Admit: 2022-06-15 | Discharge: 2022-06-15 | Disposition: A | Discharge status: Home / Self Care | Payer: Medicare Other | Source: Ambulatory Visit | Attending: General Surgery | Admitting: General Surgery

## 2022-06-15 DIAGNOSIS — C50412 Malignant neoplasm of upper-outer quadrant of left female breast: Secondary | ICD-10-CM

## 2022-06-15 MED ORDER — ENSURE PRE-SURGERY PO LIQD: 296.0000 mL | Freq: Once | ORAL | Status: DC

## 2022-06-15 NOTE — Progress Notes (Signed)

## 2022-06-16 ENCOUNTER — Ambulatory Visit (HOSPITAL_BASED_OUTPATIENT_CLINIC_OR_DEPARTMENT_OTHER)
Admission: RE | Admit: 2022-06-16 | Discharge: 2022-06-16 | Disposition: A | Discharge status: Home / Self Care | Payer: Medicare Other | Attending: General Surgery | Admitting: General Surgery

## 2022-06-16 ENCOUNTER — Ambulatory Visit
Admission: RE | Admit: 2022-06-16 | Discharge: 2022-06-16 | Disposition: A | Discharge status: Home / Self Care | Payer: Medicare Other | Source: Ambulatory Visit | Attending: General Surgery | Admitting: General Surgery

## 2022-06-16 ENCOUNTER — Encounter (HOSPITAL_BASED_OUTPATIENT_CLINIC_OR_DEPARTMENT_OTHER): Payer: Self-pay | Admitting: General Surgery

## 2022-06-16 ENCOUNTER — Other Ambulatory Visit: Payer: Self-pay

## 2022-06-16 ENCOUNTER — Encounter (HOSPITAL_BASED_OUTPATIENT_CLINIC_OR_DEPARTMENT_OTHER)
Admission: RE | Disposition: A | Discharge status: Home / Self Care | Payer: Self-pay | Source: Home / Self Care | Attending: General Surgery

## 2022-06-16 ENCOUNTER — Ambulatory Visit (HOSPITAL_BASED_OUTPATIENT_CLINIC_OR_DEPARTMENT_OTHER): Payer: Medicare Other | Admitting: Certified Registered"

## 2022-06-16 DIAGNOSIS — Z170 Estrogen receptor positive status [ER+]: Secondary | ICD-10-CM | POA: Insufficient documentation

## 2022-06-16 DIAGNOSIS — Z87891 Personal history of nicotine dependence: Secondary | ICD-10-CM | POA: Diagnosis not present

## 2022-06-16 DIAGNOSIS — Z8542 Personal history of malignant neoplasm of other parts of uterus: Secondary | ICD-10-CM | POA: Insufficient documentation

## 2022-06-16 DIAGNOSIS — Z9221 Personal history of antineoplastic chemotherapy: Secondary | ICD-10-CM | POA: Insufficient documentation

## 2022-06-16 DIAGNOSIS — T451X5A Adverse effect of antineoplastic and immunosuppressive drugs, initial encounter: Secondary | ICD-10-CM | POA: Insufficient documentation

## 2022-06-16 DIAGNOSIS — Z923 Personal history of irradiation: Secondary | ICD-10-CM | POA: Diagnosis not present

## 2022-06-16 DIAGNOSIS — K219 Gastro-esophageal reflux disease without esophagitis: Secondary | ICD-10-CM | POA: Insufficient documentation

## 2022-06-16 DIAGNOSIS — C50912 Malignant neoplasm of unspecified site of left female breast: Secondary | ICD-10-CM | POA: Diagnosis not present

## 2022-06-16 DIAGNOSIS — M1990 Unspecified osteoarthritis, unspecified site: Secondary | ICD-10-CM | POA: Insufficient documentation

## 2022-06-16 DIAGNOSIS — G620 Drug-induced polyneuropathy: Secondary | ICD-10-CM | POA: Diagnosis not present

## 2022-06-16 DIAGNOSIS — Z79899 Other long term (current) drug therapy: Secondary | ICD-10-CM | POA: Insufficient documentation

## 2022-06-16 DIAGNOSIS — I10 Essential (primary) hypertension: Secondary | ICD-10-CM | POA: Diagnosis not present

## 2022-06-16 DIAGNOSIS — E039 Hypothyroidism, unspecified: Secondary | ICD-10-CM | POA: Diagnosis not present

## 2022-06-16 DIAGNOSIS — Z01818 Encounter for other preprocedural examination: Secondary | ICD-10-CM

## 2022-06-16 DIAGNOSIS — D0512 Intraductal carcinoma in situ of left breast: Secondary | ICD-10-CM | POA: Insufficient documentation

## 2022-06-16 HISTORY — PX: BREAST LUMPECTOMY: SHX2

## 2022-06-16 HISTORY — PX: BREAST LUMPECTOMY WITH RADIOACTIVE SEED LOCALIZATION: SHX6424

## 2022-06-16 SURGERY — BREAST LUMPECTOMY WITH RADIOACTIVE SEED LOCALIZATION
Anesthesia: General | Site: Breast | Laterality: Left

## 2022-06-16 MED ORDER — PROPOFOL 10 MG/ML IV BOLUS
INTRAVENOUS | Status: AC
  Filled 2022-06-16: qty 20

## 2022-06-16 MED ORDER — ONDANSETRON HCL 4 MG/2ML IJ SOLN
INTRAMUSCULAR | Status: DC | PRN
  Administered 2022-06-16: 4 mg via INTRAVENOUS

## 2022-06-16 MED ORDER — ACETAMINOPHEN 500 MG PO TABS
1000.0000 mg | ORAL_TABLET | ORAL | Status: AC
  Administered 2022-06-16: 1000 mg via ORAL

## 2022-06-16 MED ORDER — DEXAMETHASONE SODIUM PHOSPHATE 10 MG/ML IJ SOLN
INTRAMUSCULAR | Status: DC | PRN
  Administered 2022-06-16: 8 mg via INTRAVENOUS

## 2022-06-16 MED ORDER — FENTANYL CITRATE (PF) 100 MCG/2ML IJ SOLN
25.0000 ug | INTRAMUSCULAR | Status: DC | PRN
  Administered 2022-06-16: 25 ug via INTRAVENOUS

## 2022-06-16 MED ORDER — PROPOFOL 500 MG/50ML IV EMUL
INTRAVENOUS | Status: DC | PRN
  Administered 2022-06-16: 150 ug/kg/min via INTRAVENOUS

## 2022-06-16 MED ORDER — BUPIVACAINE HCL (PF) 0.25 % IJ SOLN
INTRAMUSCULAR | Status: DC | PRN
  Administered 2022-06-16: 10 mL

## 2022-06-16 MED ORDER — CIPROFLOXACIN IN D5W 400 MG/200ML IV SOLN
400.0000 mg | INTRAVENOUS | Status: AC
  Administered 2022-06-16: 400 mg via INTRAVENOUS

## 2022-06-16 MED ORDER — CHLORHEXIDINE GLUCONATE CLOTH 2 % EX PADS
6.0000 | MEDICATED_PAD | Freq: Once | CUTANEOUS | Status: AC
  Administered 2022-06-16: 6 via TOPICAL

## 2022-06-16 MED ORDER — FENTANYL CITRATE (PF) 100 MCG/2ML IJ SOLN
INTRAMUSCULAR | Status: AC
  Filled 2022-06-16: qty 2

## 2022-06-16 MED ORDER — LIDOCAINE HCL (CARDIAC) PF 100 MG/5ML IV SOSY
PREFILLED_SYRINGE | INTRAVENOUS | Status: DC | PRN
  Administered 2022-06-16: 60 mg via INTRAVENOUS

## 2022-06-16 MED ORDER — CHLORHEXIDINE GLUCONATE CLOTH 2 % EX PADS: 6.0000 | MEDICATED_PAD | Freq: Once | CUTANEOUS | Status: DC

## 2022-06-16 MED ORDER — FENTANYL CITRATE (PF) 100 MCG/2ML IJ SOLN
INTRAMUSCULAR | Status: DC | PRN
  Administered 2022-06-16: 50 ug via INTRAVENOUS

## 2022-06-16 MED ORDER — EPHEDRINE SULFATE (PRESSORS) 50 MG/ML IJ SOLN
INTRAMUSCULAR | Status: DC | PRN
  Administered 2022-06-16 (×2): 10 mg via INTRAVENOUS
  Administered 2022-06-16: 5 mg via INTRAVENOUS
  Administered 2022-06-16: 10 mg via INTRAVENOUS

## 2022-06-16 MED ORDER — CIPROFLOXACIN IN D5W 400 MG/200ML IV SOLN
INTRAVENOUS | Status: AC
  Filled 2022-06-16: qty 200

## 2022-06-16 MED ORDER — PHENYLEPHRINE HCL (PRESSORS) 10 MG/ML IV SOLN
INTRAVENOUS | Status: DC | PRN
  Administered 2022-06-16: 160 ug via INTRAVENOUS

## 2022-06-16 MED ORDER — PROPOFOL 10 MG/ML IV BOLUS
INTRAVENOUS | Status: DC | PRN
  Administered 2022-06-16: 180 mg via INTRAVENOUS

## 2022-06-16 MED ORDER — LACTATED RINGERS IV SOLN: INTRAVENOUS | Status: DC

## 2022-06-16 MED ORDER — ACETAMINOPHEN 500 MG PO TABS
ORAL_TABLET | ORAL | Status: AC
  Filled 2022-06-16: qty 2

## 2022-06-16 MED ORDER — EPHEDRINE 5 MG/ML INJ
INTRAVENOUS | Status: AC
  Filled 2022-06-16: qty 5

## 2022-06-16 SURGICAL SUPPLY — 61 items
ADH SKN CLS APL DERMABOND .7 (GAUZE/BANDAGES/DRESSINGS) ×1
APL PRP STRL LF DISP 70% ISPRP (MISCELLANEOUS) ×1
APPLIER CLIP 9.375 MED OPEN (MISCELLANEOUS) ×2
APR CLP MED 9.3 20 MLT OPN (MISCELLANEOUS) ×1
BINDER BREAST LRG (GAUZE/BANDAGES/DRESSINGS) ×1 IMPLANT
BINDER BREAST MEDIUM (GAUZE/BANDAGES/DRESSINGS) IMPLANT
BINDER BREAST XLRG (GAUZE/BANDAGES/DRESSINGS) IMPLANT
BINDER BREAST XXLRG (GAUZE/BANDAGES/DRESSINGS) IMPLANT
BLADE SURG 15 STRL LF DISP TIS (BLADE) ×1 IMPLANT
BLADE SURG 15 STRL SS (BLADE) ×2
CANISTER SUC SOCK COL 7IN (MISCELLANEOUS) IMPLANT
CANISTER SUCT 1200ML W/VALVE (MISCELLANEOUS) IMPLANT
CHLORAPREP W/TINT 26 (MISCELLANEOUS) ×2 IMPLANT
CLIP APPLIE 9.375 MED OPEN (MISCELLANEOUS) IMPLANT
CLIP TI WIDE RED SMALL 6 (CLIP) IMPLANT
COVER BACK TABLE 60X90IN (DRAPES) ×2 IMPLANT
COVER MAYO STAND STRL (DRAPES) ×2 IMPLANT
COVER PROBE W GEL 5X96 (DRAPES) ×2 IMPLANT
DERMABOND ADVANCED (GAUZE/BANDAGES/DRESSINGS) ×1
DERMABOND ADVANCED .7 DNX12 (GAUZE/BANDAGES/DRESSINGS) ×1 IMPLANT
DRAPE LAPAROSCOPIC ABDOMINAL (DRAPES) ×2 IMPLANT
DRAPE UTILITY XL STRL (DRAPES) ×2 IMPLANT
DRSG TEGADERM 4X4.75 (GAUZE/BANDAGES/DRESSINGS) IMPLANT
ELECT COATED BLADE 2.86 ST (ELECTRODE) ×2 IMPLANT
ELECT REM PT RETURN 9FT ADLT (ELECTROSURGICAL) ×2
ELECTRODE REM PT RTRN 9FT ADLT (ELECTROSURGICAL) ×1 IMPLANT
GAUZE SPONGE 4X4 12PLY STRL LF (GAUZE/BANDAGES/DRESSINGS) IMPLANT
GLOVE BIO SURGEON STRL SZ7 (GLOVE) ×4 IMPLANT
GLOVE BIOGEL PI IND STRL 6.5 (GLOVE) IMPLANT
GLOVE BIOGEL PI IND STRL 7.0 (GLOVE) IMPLANT
GLOVE BIOGEL PI IND STRL 7.5 (GLOVE) ×1 IMPLANT
GLOVE BIOGEL PI INDICATOR 6.5 (GLOVE) ×1
GLOVE BIOGEL PI INDICATOR 7.0 (GLOVE) ×1
GLOVE BIOGEL PI INDICATOR 7.5 (GLOVE) ×1
GOWN STRL REUS W/ TWL LRG LVL3 (GOWN DISPOSABLE) ×2 IMPLANT
GOWN STRL REUS W/TWL LRG LVL3 (GOWN DISPOSABLE) ×4
HEMOSTAT ARISTA ABSORB 3G PWDR (HEMOSTASIS) IMPLANT
KIT MARKER MARGIN INK (KITS) ×2 IMPLANT
NDL HYPO 25X1 1.5 SAFETY (NEEDLE) ×1 IMPLANT
NEEDLE HYPO 25X1 1.5 SAFETY (NEEDLE) ×2 IMPLANT
NS IRRIG 1000ML POUR BTL (IV SOLUTION) IMPLANT
PACK BASIN DAY SURGERY FS (CUSTOM PROCEDURE TRAY) ×2 IMPLANT
PENCIL SMOKE EVACUATOR (MISCELLANEOUS) ×2 IMPLANT
RETRACTOR ONETRAX LX 90X20 (MISCELLANEOUS) IMPLANT
SLEEVE SCD COMPRESS KNEE MED (STOCKING) ×2 IMPLANT
SPIKE FLUID TRANSFER (MISCELLANEOUS) IMPLANT
SPONGE T-LAP 4X18 ~~LOC~~+RFID (SPONGE) ×2 IMPLANT
STRIP CLOSURE SKIN 1/2X4 (GAUZE/BANDAGES/DRESSINGS) ×2 IMPLANT
SUT MNCRL AB 4-0 PS2 18 (SUTURE) ×2 IMPLANT
SUT MON AB 5-0 PS2 18 (SUTURE) IMPLANT
SUT SILK 2 0 SH (SUTURE) ×1 IMPLANT
SUT VIC AB 2-0 SH 27 (SUTURE) ×4
SUT VIC AB 2-0 SH 27XBRD (SUTURE) ×1 IMPLANT
SUT VIC AB 3-0 SH 27 (SUTURE) ×2
SUT VIC AB 3-0 SH 27X BRD (SUTURE) ×1 IMPLANT
SUT VIC AB 5-0 PS2 18 (SUTURE) IMPLANT
SYR CONTROL 10ML LL (SYRINGE) ×2 IMPLANT
TOWEL GREEN STERILE FF (TOWEL DISPOSABLE) ×2 IMPLANT
TRAY FAXITRON CT DISP (TRAY / TRAY PROCEDURE) ×2 IMPLANT
TUBE CONNECTING 20X1/4 (TUBING) IMPLANT
YANKAUER SUCT BULB TIP NO VENT (SUCTIONS) IMPLANT

## 2022-06-16 NOTE — Op Note (Signed)
Preoperative diagnosis: Left breast cancer, clinical stage I Postoperative diagnosis: Same as above Procedure: Left breast radioactive seed guided lumpectomy Surgeon: Dr. Serita Grammes Estimated blood loss: Minimal Complications: None Drains: None Specimens: 1.  Left breast tissue containing seed and clip marked with paint 2.  Additional medial and posterior margins marked short stitch superior, long stitch lateral, double stitch deep Sponge and count was correct completion Disposition to recovery stable condition  Indications: This is a 81 year old female who has recently been treated with chemotherapy for endometrial cancer followed by hysterectomy.  She then completed radiotherapy.  During this time she had a chest CT that noted a left breast mass.  This was evaluated and ultrasound showed a 1.7 x 1.2 x 1.9 cm mass.  Her axillary ultrasound was negative.  Biopsy showed invasive mammary carcinoma that was ER/PR positive.  Due to her deconditioning from her endometrial cancer therapy we started her on an AI and have waited to do surgery.  On ultrasound the mass is about the same.  We discussed proceeding with a lumpectomy.  Procedure: After informed consent was obtained she first had a seed placed at the breast center.  I had these mammograms in the operating room.  She was given antibiotics.  SCDs were placed.  She was placed under general anesthesia without complication.  She was prepped and draped in the standard sterile surgical fashion.  I filtrated Marcaine throughout the left upper outer quadrant.  I then made a curvilinear incision overlying the tumor.  I dissected down and identified the seed.  I remove the seed and the surrounding tissue in that attempt to get a clear margin.  I then did a mammogram confirmed removal of the seed and the clip.  The 3D images showed it look like I was close to 2 margins.  I removed additional medial and posterior margins.  The posterior margin is now the  muscle.  These were marked as above.  I then obtained hemostasis.  I closed the cavity with 2-0 Vicryl.  The skin was closed with 3-0 Vicryl for Monocryl.  Glue Steri-Strips were applied.  She tolerated this well was extubated and transferred recovery stable.

## 2022-06-16 NOTE — Interval H&P Note (Signed)
History and Physical Interval Note:  06/16/2022 8:10 AM  Kristin Carlson  has presented today for surgery, with the diagnosis of LEFT BREAST CANCER.  The various methods of treatment have been discussed with the patient and family. After consideration of risks, benefits and other options for treatment, the patient has consented to  Procedure(s): LEFT BREAST LUMPECTOMY WITH RADIOACTIVE SEED LOCALIZATION (Left) as a surgical intervention.  The patient's history has been reviewed, patient examined, no change in status, stable for surgery.  I have reviewed the patient's chart and labs.  Questions were answered to the patient's satisfaction.     Rolm Bookbinder

## 2022-06-16 NOTE — Transfer of Care (Signed)
Immediate Anesthesia Transfer of Care Note  Patient: Kristin Carlson  Procedure(s) Performed: LEFT BREAST LUMPECTOMY WITH RADIOACTIVE SEED LOCALIZATION (Left: Breast)  Patient Location: PACU  Anesthesia Type:General  Level of Consciousness: drowsy  Airway & Oxygen Therapy: Patient Spontanous Breathing and Patient connected to face mask oxygen  Post-op Assessment: Report given to RN and Post -op Vital signs reviewed and stable  Post vital signs: Reviewed and stable  Last Vitals:  Vitals Value Taken Time  BP 125/74 06/16/22 0930  Temp    Pulse 72 06/16/22 0930  Resp 18 06/16/22 0930  SpO2 100 % 06/16/22 0930  Vitals shown include unvalidated device data.  Last Pain:  Vitals:   06/16/22 0724  TempSrc: Oral  PainSc: 0-No pain      Patients Stated Pain Goal: 3 (42/55/25 8948)  Complications: No notable events documented.

## 2022-06-16 NOTE — Anesthesia Preprocedure Evaluation (Addendum)
Anesthesia Evaluation  Patient identified by MRN, date of birth, ID band Patient awake    Reviewed: Allergy & Precautions, NPO status , Patient's Chart, lab work & pertinent test results, reviewed documented beta blocker date and time   History of Anesthesia Complications (+) PONV and history of anesthetic complications  Airway Mallampati: II  TM Distance: >3 FB Neck ROM: Full    Dental no notable dental hx. (+) Teeth Intact, Dental Advisory Given   Pulmonary neg pulmonary ROS, former smoker,    Pulmonary exam normal breath sounds clear to auscultation       Cardiovascular hypertension, Pt. on home beta blockers and Pt. on medications Normal cardiovascular exam Rhythm:Regular Rate:Normal     Neuro/Psych negative neurological ROS  negative psych ROS   GI/Hepatic Neg liver ROS, GERD  ,  Endo/Other  Hypothyroidism   Renal/GU negative Renal ROS  negative genitourinary   Musculoskeletal  (+) Arthritis ,   Abdominal   Peds  Hematology negative hematology ROS (+)   Anesthesia Other Findings   Reproductive/Obstetrics                            Anesthesia Physical Anesthesia Plan  ASA: 2  Anesthesia Plan: General   Post-op Pain Management: Tylenol PO (pre-op)*   Induction: Intravenous  PONV Risk Score and Plan: 4 or greater and Ondansetron, Dexamethasone, Treatment may vary due to age or medical condition and TIVA  Airway Management Planned: LMA  Additional Equipment:   Intra-op Plan:   Post-operative Plan: Extubation in OR  Informed Consent: I have reviewed the patients History and Physical, chart, labs and discussed the procedure including the risks, benefits and alternatives for the proposed anesthesia with the patient or authorized representative who has indicated his/her understanding and acceptance.     Dental advisory given  Plan Discussed with: CRNA  Anesthesia Plan  Comments:        Anesthesia Quick Evaluation

## 2022-06-16 NOTE — Anesthesia Postprocedure Evaluation (Signed)
Anesthesia Post Note  Patient: Kristin Carlson  Procedure(s) Performed: LEFT BREAST LUMPECTOMY WITH RADIOACTIVE SEED LOCALIZATION (Left: Breast)     Patient location during evaluation: PACU Anesthesia Type: General Level of consciousness: awake and alert Pain management: pain level controlled Vital Signs Assessment: post-procedure vital signs reviewed and stable Respiratory status: spontaneous breathing, nonlabored ventilation, respiratory function stable and patient connected to nasal cannula oxygen Cardiovascular status: blood pressure returned to baseline and stable Postop Assessment: no apparent nausea or vomiting Anesthetic complications: no   No notable events documented.  Last Vitals:  Vitals:   06/16/22 1000 06/16/22 1015  BP: (!) 157/75   Pulse: 60 (!) 57  Resp: 18 16  Temp:    SpO2: 100% 99%    Last Pain:  Vitals:   06/16/22 1015  TempSrc:   PainSc: 3                  Parker Wherley L Amea Mcphail

## 2022-06-16 NOTE — Discharge Instructions (Addendum)
[facility] Phone Number [phone]  POST OP INSTRUCTIONS Take 400 mg of ibuprofen every 8 hours or 650 mg tylenol every 6 hours for next 72 hours then as needed. Use ice several times daily also.  A prescription for pain medication may be given to you upon discharge.  Take your pain medication as prescribed, if needed.  If narcotic pain medicine is not needed, then you may take acetaminophen (Tylenol), naprosyn (Alleve) or ibuprofen (Advil) as needed. Take your usually prescribed medications unless otherwise directed If you need a refill on your pain medication, please contact your pharmacy.  They will contact our office to request authorization.  Prescriptions will not be filled after 5pm or on week-ends. You should eat very light the first 24 hours after surgery, such as soup, crackers, pudding, etc.  Resume your normal diet the day after surgery. Most patients will experience some swelling and bruising in the breast.  Ice packs and a good support bra will help.  Wear the breast binder provided or a sports bra for 72 hours day and night.  After that wear a sports bra during the day until you return to the office. Swelling and bruising can take several days to resolve.  It is common to experience some constipation if taking pain medication after surgery.  Increasing fluid intake and taking a stool softener will usually help or prevent this problem from occurring.  A mild laxative (Milk of Magnesia or Miralax) should be taken according to package directions if there are no bowel movements after 48 hours. I used skin glue on the incision, you may shower in 24 hours.  The glue will flake off over the next 2-3 weeks.  Any sutures or staples will be removed at the office during your follow-up visit. ACTIVITIES:  You may resume regular daily activities (gradually increasing) beginning the next day.  Wearing a good support bra or sports bra minimizes pain and swelling.  You may have  sexual intercourse when it is comfortable. You may drive when you no longer are taking prescription pain medication, you can comfortably wear a seatbelt, and you can safely maneuver your car and apply brakes. RETURN TO WORK:  ______________________________________________________________________________________ Dennis Bast should see your doctor in the office for a follow-up appointment approximately two weeks after your surgery.  Your doctor's nurse will typically make your follow-up appointment when she calls you with your pathology report.  Expect your pathology report 3-4 business days after your surgery.  You may call to check if you do not hear from [place] after three days. OTHER INSTRUCTIONS: _______________________________________________________________________________________________ _____________________________________________________________________________________________________________________________________ _____________________________________________________________________________________________________________________________________ _____________________________________________________________________________________________________________________________________  WHEN TO CALL DR WAKEFIELD: Fever over 101.0 Nausea and/or vomiting. Extreme swelling or bruising. Continued bleeding from incision. Increased pain, redness, or drainage from the incision.  The clinic staff is available to answer your questions during regular business hours.  Please don't hesitate to call and ask to speak to one of the nurses for clinical concerns.  If you have a medical emergency, go to the nearest emergency room or call 911.  A surgeon from [facility] is always on call at the hospital.  For further questions, please visit [email or web address]  Post Anesthesia Home Care Instructions  Activity: Get plenty of rest for the remainder of the day. A responsible individual must stay with  you for 24 hours following the procedure.  For the next 24 hours, DO NOT: -Drive a car -Paediatric nurse -Drink alcoholic beverages -Take any medication unless instructed by your  physician -Make any legal decisions or sign important papers.  Meals: Start with liquid foods such as gelatin or soup. Progress to regular foods as tolerated. Avoid greasy, spicy, heavy foods. If nausea and/or vomiting occur, drink only clear liquids until the nausea and/or vomiting subsides. Call your physician if vomiting continues.  Special Instructions/Symptoms: Your throat may feel dry or sore from the anesthesia or the breathing tube placed in your throat during surgery. If this causes discomfort, gargle with warm salt water. The discomfort should disappear within 24 hours.  *May have Tylenol today after 1:30pm 06/16/22

## 2022-06-16 NOTE — H&P (Signed)
72 yof who has completed chemotherapy for endometrial cancer. She underwent hysterectomy. She had a difficult time with chemotherapy. She will complete this and radiotherapy around the beginning of May. She was noted on a chest ct to have a left breast mass. This was followed by mammogram. Breasts are c density. Right breast is negative. Left breast shows an irregular mass with distortion in the left upper outer breast. This measures 1.4 cm. US shows a 1.7x1.2x1.9 cm mass. The ax [place] is negative. Biopsy was done and is invasive mammary carcinoma with dcis, grade 2, er pos at >90, pr pos at 11-50 and her 2 negative. she has completed chemotherapy and has not yet gotten stronger. She has sob and significant fatigue. She is concerned about surgery. There is slight reduction in size on latest [place] and mm last time I saw her. We elected to start an AI with her oncologist and wait. She feels much better today. She is having hot flashes with the AI.    Review of Systems: A complete review of systems was obtained from the patient. I have reviewed this information and discussed as appropriate with the patient. See HPI as well for other ROS.  Review of Systems  Constitutional: Positive for malaise/fatigue.  All other systems reviewed and are negative.   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)  Endometrial cancer (CMS-HCC)   Past Surgical History:  Procedure Laterality Date  LAPAROSCOPIC TOTAL HYSTERECTOMY  SALPINGO OOPHORECTOMY    Allergies  Allergen Reactions  Lactose Other (See Comments)  Gi- Upset  Codeine Nausea And Vomiting and Nausea  Penicillins Rash  Sulfa (Sulfonamide Antibiotics) Rash  Vancomycin Itching and  Rash   Current Outpatient Medications on File Prior to Visit  Medication Sig Dispense Refill  acetaminophen (TYLENOL) 500 MG tablet Take by mouth  cholecalciferol (VITAMIN D3) 2,000 unit capsule Take by mouth  dexAMETHasone (DECADRON) 4 MG tablet  docosahexaenoic acid-epa 120-180 mg Cap Take by mouth  levothyroxine (SYNTHROID) 137 MCG tablet Take by mouth  lidocaine-prilocaine (EMLA) cream  losartan (COZAAR) 50 MG tablet Take 50 mg by mouth once daily  magnesium oxide (MAG-OX) 400 mg (241.3 mg magnesium) tablet Take by mouth  pantoprazole (PROTONIX) 20 MG DR tablet  prochlorperazine (COMPAZINE) 10 MG tablet  propranoloL (INDERAL) 40 MG tablet Take 40 mg by mouth 2 (two) times daily  simethicone 125 mg Tab Take by mouth  temazepam (RESTORIL) 15 mg capsule  rosuvastatin (CRESTOR) 5 MG tablet Take 5 mg by mouth once daily    History reviewed. No pertinent family history.   Social History   Tobacco Use  Smoking Status Former  Types: Cigarettes  Quit date: 1968  Years since quitting: 55.5  Smokeless Tobacco Never  Marital status: Married  Tobacco Use  Smoking status: Former  Types: Cigarettes  Quit date: 1968  Years since quitting: 55.5  Smokeless tobacco: Never  Vaping Use  Vaping Use: Never used  Substance and Sexual Activity  Alcohol use: Not Currently  Drug use: Never   Objective:   Physical Exam Vitals reviewed.  Constitutional:  Appearance: Normal appearance.  Chest:  Breasts: Right: No inverted nipple, mass or nipple discharge.  Left: No inverted nipple, mass or nipple discharge.  Lymphadenopathy:  Upper Body:  Right  upper body: No supraclavicular or axillary adenopathy.  Left upper body: No supraclavicular or axillary adenopathy.  Neurological:  Mental Status: She is alert.    Assessment and Plan:   Malignant neoplasm of upper-outer quadrant of left breast in female, estrogen receptor positive (CMS-HCC)  Left breast radioactive seed guided  lumpectomy  I think she is ready for surgery at this point. Due to her other cancer her age and this type of tumor I think it is reasonable to proceed with lumpectomy alone and omit a sentinel lymph node biopsy. We discussed a seed guided lumpectomy with the risk of a positive margin necessitating a second surgery. I will plan on scheduling her in the next several weeks.

## 2022-06-16 NOTE — Anesthesia Procedure Notes (Signed)
Procedure Name: LMA Insertion Date/Time: 06/16/2022 8:40 AM  Performed by: Lavonia Dana, CRNAPre-anesthesia Checklist: Patient identified, Emergency Drugs available, Suction available and Patient being monitored Patient Re-evaluated:Patient Re-evaluated prior to induction Oxygen Delivery Method: Circle system utilized Preoxygenation: Pre-oxygenation with 100% oxygen Induction Type: IV induction Ventilation: Mask ventilation without difficulty LMA: LMA inserted LMA Size: 4.0 Number of attempts: 1 Airway Equipment and Method: Bite block Placement Confirmation: positive ETCO2 Tube secured with: Tape Dental Injury: Teeth and Oropharynx as per pre-operative assessment

## 2022-06-17 ENCOUNTER — Encounter (HOSPITAL_BASED_OUTPATIENT_CLINIC_OR_DEPARTMENT_OTHER): Payer: Self-pay | Admitting: General Surgery

## 2022-06-18 LAB — SURGICAL PATHOLOGY

## 2022-06-19 ENCOUNTER — Ambulatory Visit: Payer: TRICARE For Life (TFL) | Admitting: Family

## 2022-06-23 ENCOUNTER — Encounter: Payer: Self-pay | Admitting: *Deleted

## 2022-06-26 ENCOUNTER — Ambulatory Visit
Admission: RE | Admit: 2022-06-26 | Discharge: 2022-06-26 | Disposition: A | Discharge status: Home / Self Care | Payer: Medicare Other | Source: Ambulatory Visit | Attending: Hematology and Oncology | Admitting: Hematology and Oncology

## 2022-06-26 DIAGNOSIS — C50412 Malignant neoplasm of upper-outer quadrant of left female breast: Secondary | ICD-10-CM | POA: Diagnosis present

## 2022-06-26 DIAGNOSIS — R911 Solitary pulmonary nodule: Secondary | ICD-10-CM | POA: Insufficient documentation

## 2022-06-26 DIAGNOSIS — Z801 Family history of malignant neoplasm of trachea, bronchus and lung: Secondary | ICD-10-CM | POA: Diagnosis present

## 2022-06-26 DIAGNOSIS — C541 Malignant neoplasm of endometrium: Secondary | ICD-10-CM | POA: Diagnosis present

## 2022-06-29 ENCOUNTER — Other Ambulatory Visit: Payer: Self-pay

## 2022-06-29 ENCOUNTER — Inpatient Hospital Stay (HOSPITAL_BASED_OUTPATIENT_CLINIC_OR_DEPARTMENT_OTHER): Payer: Medicare Other | Admitting: Hematology and Oncology

## 2022-06-29 ENCOUNTER — Inpatient Hospital Stay: Payer: Medicare Other | Attending: Gynecologic Oncology

## 2022-06-29 ENCOUNTER — Telehealth: Payer: Self-pay

## 2022-06-29 ENCOUNTER — Encounter: Payer: Self-pay | Admitting: Hematology and Oncology

## 2022-06-29 VITALS — BP 164/79 | HR 62 | Resp 18 | Ht 63.5 in | Wt 182.4 lb

## 2022-06-29 DIAGNOSIS — R911 Solitary pulmonary nodule: Secondary | ICD-10-CM | POA: Insufficient documentation

## 2022-06-29 DIAGNOSIS — Z79811 Long term (current) use of aromatase inhibitors: Secondary | ICD-10-CM | POA: Insufficient documentation

## 2022-06-29 DIAGNOSIS — C50412 Malignant neoplasm of upper-outer quadrant of left female breast: Secondary | ICD-10-CM | POA: Insufficient documentation

## 2022-06-29 DIAGNOSIS — G620 Drug-induced polyneuropathy: Secondary | ICD-10-CM | POA: Insufficient documentation

## 2022-06-29 DIAGNOSIS — T451X5A Adverse effect of antineoplastic and immunosuppressive drugs, initial encounter: Secondary | ICD-10-CM

## 2022-06-29 DIAGNOSIS — N951 Menopausal and female climacteric states: Secondary | ICD-10-CM | POA: Insufficient documentation

## 2022-06-29 DIAGNOSIS — Z170 Estrogen receptor positive status [ER+]: Secondary | ICD-10-CM | POA: Diagnosis not present

## 2022-06-29 DIAGNOSIS — C541 Malignant neoplasm of endometrium: Secondary | ICD-10-CM

## 2022-06-29 DIAGNOSIS — R232 Flushing: Secondary | ICD-10-CM

## 2022-06-29 LAB — CBC WITH DIFFERENTIAL (CANCER CENTER ONLY)
Abs Immature Granulocytes: 0.01 10*3/uL (ref 0.00–0.07)
Basophils Absolute: 0 10*3/uL (ref 0.0–0.1)
Basophils Relative: 1 %
Eosinophils Absolute: 0.2 10*3/uL (ref 0.0–0.5)
Eosinophils Relative: 4 %
HCT: 37.8 % (ref 36.0–46.0)
Hemoglobin: 12.8 g/dL (ref 12.0–15.0)
Immature Granulocytes: 0 %
Lymphocytes Relative: 30 %
Lymphs Abs: 1.3 10*3/uL (ref 0.7–4.0)
MCH: 31.1 pg (ref 26.0–34.0)
MCHC: 33.9 g/dL (ref 30.0–36.0)
MCV: 91.7 fL (ref 80.0–100.0)
Monocytes Absolute: 0.5 10*3/uL (ref 0.1–1.0)
Monocytes Relative: 10 %
Neutro Abs: 2.4 10*3/uL (ref 1.7–7.7)
Neutrophils Relative %: 55 %
Platelet Count: 181 10*3/uL (ref 150–400)
RBC: 4.12 MIL/uL (ref 3.87–5.11)
RDW: 12.4 % (ref 11.5–15.5)
WBC Count: 4.4 10*3/uL (ref 4.0–10.5)
nRBC: 0 % (ref 0.0–0.2)

## 2022-06-29 LAB — CMP (CANCER CENTER ONLY)
ALT: 16 U/L (ref 0–44)
AST: 21 U/L (ref 15–41)
Albumin: 4.1 g/dL (ref 3.5–5.0)
Alkaline Phosphatase: 75 U/L (ref 38–126)
Anion gap: 5 (ref 5–15)
BUN: 16 mg/dL (ref 8–23)
CO2: 28 mmol/L (ref 22–32)
Calcium: 9.2 mg/dL (ref 8.9–10.3)
Chloride: 105 mmol/L (ref 98–111)
Creatinine: 0.93 mg/dL (ref 0.44–1.00)
GFR, Estimated: >60 mL/min (ref >60)
Glucose, Bld: 99 mg/dL (ref 70–99)
Potassium: 4.4 mmol/L (ref 3.5–5.1)
Sodium: 138 mmol/L (ref 135–145)
Total Bilirubin: 0.5 mg/dL (ref 0.3–1.2)
Total Protein: 7.1 g/dL (ref 6.5–8.1)

## 2022-06-29 MED ORDER — GABAPENTIN 300 MG PO CAPS: 300.0000 mg | ORAL_CAPSULE | Freq: Two times a day (BID) | ORAL | 3 refills | Status: DC

## 2022-06-29 NOTE — Assessment & Plan Note (Signed)
She has mild persistent peripheral neuropathy We discussed the role of gabapentin

## 2022-06-29 NOTE — Telephone Encounter (Signed)
Called and left a message asking her to call the office back. Appt scheduled with Dr. Verlee Monte on 8/10 with [facility] at 104. Ask her to call the office back.

## 2022-06-29 NOTE — Telephone Encounter (Signed)
She called back and given appt details with address at [facility]. She verbalized understanding.

## 2022-06-29 NOTE — Assessment & Plan Note (Signed)
We reviewed CT imaging results Unfortunately, she has an enlarging left upper lung nodule, concerning for metastatic disease Per radiologist recommendation, we will proceed with PET/CT imaging and referral to pulmonologist for biopsy I will see her back after results are available In the meantime, she will continue Arimidex

## 2022-06-29 NOTE — Telephone Encounter (Signed)
Returned her call and left a message. Ask her to call the office back to get appt details for appt at [facility] scheduled with Dr. Verlee Monte on 8/10 at 1030.

## 2022-06-29 NOTE — Assessment & Plan Note (Signed)
She has completed adjuvant treatment for this We will pursue PET/CT imaging and biopsy to rule out metastatic disease

## 2022-06-29 NOTE — Assessment & Plan Note (Signed)
She has profound hot flashes We discussed the role of gabapentin to manage neuropathy and hot flashes We discussed risk of constipation and other side effects to be expected

## 2022-06-29 NOTE — Progress Notes (Signed)
[place] OFFICE PROGRESS NOTE  Patient Care Team: [name], FNP as PCP - General (Nurse Practitioner)  ASSESSMENT & PLAN:  Breast cancer of upper-outer quadrant of left female breast [facility]) We reviewed CT imaging results Unfortunately, she has an enlarging left upper lung nodule, concerning for metastatic disease Per radiologist recommendation, we will proceed with PET/CT imaging and referral to pulmonologist for biopsy I will see her back after results are available In the meantime, she will continue Arimidex  Endometrial cancer [facility]) She has completed adjuvant treatment for this We will pursue PET/CT imaging and biopsy to rule out metastatic disease  Peripheral neuropathy due to chemotherapy [facility]) She has mild persistent peripheral neuropathy We discussed the role of gabapentin  Hot flashes related to aromatase inhibitor therapy She has profound hot flashes We discussed the role of gabapentin to manage neuropathy and hot flashes We discussed risk of constipation and other side effects to be expected  Orders Placed This Encounter  Procedures   NM PET Image Initial (PI) Skull Base To Thigh    Standing Status:   Future    Standing Expiration Date:   06/29/2023    Order Specific Question:   If indicated for the ordered procedure, I authorize the administration of a radiopharmaceutical per Radiology protocol    Answer:   Yes    Order Specific Question:   Preferred imaging location?    Answer:   [facility]   Ambulatory referral to Pulmonology    Referral Priority:   Urgent    Referral Type:   Consultation    Referral Reason:   Specialty Services Required    Requested Specialty:   Pulmonary Disease    Number of Visits Requested:   1    All questions were answered. The patient knows to call the clinic with any problems, questions or concerns. The total time spent in the appointment was 40 minutes encounter with patients including review of chart and  various tests results, discussions about plan of care and coordination of care plan   Heath Lark, MD 06/29/2022 12:12 PM  INTERVAL HISTORY: Please see below for problem oriented charting. she returns for review of test results with her husband She has been complaining of fatigue Hot flashes is interrupting her quality of life She has mild persistent neuropathy from prior treatment Denies recent cough, chest pain or shortness of breath  REVIEW OF SYSTEMS:   Constitutional: Denies fevers, chills or abnormal weight loss Eyes: Denies blurriness of vision Ears, nose, mouth, throat, and face: Denies mucositis or sore throat Respiratory: Denies cough, dyspnea or wheezes Cardiovascular: Denies palpitation, chest discomfort or lower extremity swelling Gastrointestinal:  Denies nausea, heartburn or change in bowel habits Skin: Denies abnormal skin rashes Lymphatics: Denies new lymphadenopathy or easy bruising Neurological:Denies numbness, tingling or new weaknesses Behavioral/Psych: Mood is stable, no new changes  All other systems were reviewed with the patient and are negative.  I have reviewed the past medical history, past surgical history, social history and family history with the patient and they are unchanged from previous note.  ALLERGIES:  is allergic to lactose, codeine, penicillins, sulfa antibiotics, and vancomycin.  MEDICATIONS:  Current Outpatient Medications  Medication Sig Dispense Refill   gabapentin (NEURONTIN) 300 MG capsule Take 1 capsule (300 mg total) by mouth 2 (two) times daily. 60 capsule 3   anastrozole (ARIMIDEX) 1 MG tablet Take 1 tablet (1 mg total) by mouth daily. 90 tablet 1   Cholecalciferol 50 MCG (2000  UT) CAPS Take 2,000 Units by mouth daily.     losartan (COZAAR) 50 MG tablet Take 1 tablet by mouth daily.     magnesium oxide (MAG-OX) 400 MG tablet Take 200 mg by mouth 2 (two) times daily.     Multiple Vitamins-Minerals (MULTIVITAMIN WOMEN 50+ PO) Take 1  tablet by mouth daily.     Omega-3 1000 MG CAPS Take 1,000 mg by mouth daily.     ondansetron (ZOFRAN) 8 MG tablet Take 1 tablet (8 mg total) by mouth every 8 (eight) hours as needed. (Patient taking differently: Take 8 mg by mouth every 8 (eight) hours as needed for nausea or vomiting.) 30 tablet 1   pantoprazole (PROTONIX) 20 MG tablet Take 20 mg by mouth daily as needed for heartburn or indigestion.     prochlorperazine (COMPAZINE) 10 MG tablet      propranolol (INDERAL) 40 MG tablet Take 40 mg by mouth 2 (two) times daily.     rosuvastatin (CRESTOR) 5 MG tablet Take 5 mg by mouth every evening.     senna-docusate (SENOKOT-S) 8.6-50 MG tablet Take 2 tablets by mouth 2 (two) times daily. 60 tablet 0   Simethicone 125 MG TABS Take 250 mg by mouth 2 (two) times daily as needed (gas).     SYNTHROID 137 MCG tablet Take 137 mcg by mouth daily.     temazepam (RESTORIL) 15 MG capsule Take 15 mg by mouth at bedtime as needed.     No current facility-administered medications for this visit.   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 Evaluation of the pelvis is significantly limited by dense metallic streak artifact from adjacent hip arthroplasty. Within this limitation, there is expansile, masslike, heterogeneously enhancing appearance of the endometrium, measuring at least 5.2 cm in thickness. Findings are in keeping with reported diagnosis of endometrial malignancy. 2. No evidence of lymphadenopathy or  metastatic disease in the chest, abdomen, or pelvis. 3. There is a 0.4 cm fissural nodule the superior segment left lower lobe, almost certainly a benign intrapulmonary lymph node. Attention on follow-up. 4. Coronary artery disease.   10/14/2021 Surgery   TRH/BSO, right SLN biopsy, left pelvic and PA LND, peritoneal nodule biopsy, mini-lap for specimen delivery  Findings: On EUA, 8cm bulbous uterus. ON intra-abdominal entry, normal upper abdominal survey. Normal omentum, small and large bowel. Uterus 8-10cm and bulbous. Normal appearing adnexa. Mapping successful on the right, no mapping on the left. Some mildly prominent lymph nodes bilaterally in the pelvis. Small, <5 mm, nodule in the cul de sac. No obvious intra-abdominal or pelvic evidence of disease.   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 SENTINEL LYMPH NODE, RIGHT EXTERNAL ILIAC, BIOPSY:  - Lymph node, negative for carcinoma (0/1)  B. SENTINEL LYMPH NODE, RIGHT OBTURATOR AND SURROUNDING LYMPH NODES,  BIOPSY:  - Lymph nodes, negative for carcinoma (0/5)   C. UTERUS, CERVIX, BILATERAL FALLOPIAN TUBES AND OVARIES:  - Invasive high-grade serous carcinoma  - Carcinoma focally invades into the serosal surface  - Lymphovascular invasion is present  - Benign unremarkable cervix  - Benign unremarkable bilateral fallopian tubes and ovaries  - See oncology table   D. CUL DE SAC NODULE, POSTERIOR, BIOPSY:  - Endometriosis   E. LYMPH NODE, LEFT PELVIC, BIOPSY:  - Lymph nodes, negative for carcinoma (0/3)   F. LYMPH NODE, LEFT PARAORTIC, BIOPSY:  - Lymph nodes, negative for carcinoma (0/5)   ONCOLOGY TABLE:   UTERUS, CARCINOMA OR CARCINOSARCOMA: Resection   Procedure: Total hysterectomy  and bilateral salpingo-oophorectomy  Histologic Type: Serous carcinoma  Histologic Grade: High-grade  Myometrial Invasion:       Depth of Myometrial Invasion (mm): 32 mm       Myometrial Thickness (mm): 32 mm       Percentage of Myometrial Invasion: 100%  Uterine Serosa Involvement: Present, focal  Cervical stromal Involvement: Not identified  Extent of involvement of other tissue/organs: Not identified  Peritoneal/Ascitic Fluid: Negative for carcinoma  Lymphovascular Invasion: Present  Regional Lymph Nodes:       Pelvic Lymph Nodes Examined:                                   6 Sentinel                                   3 Non-sentinel                                   9 Total       Pelvic Lymph Nodes with Metastasis: 0                           Macrometastasis: (>2.0 mm): 0                           Micrometastasis: (>0.2 mm and < 2.0 mm): 0                           Isolated Tumor Cells (<0.2 mm): 0                           Laterality of Lymph Node with Tumor: Not  applicable                           Extracapsular Extension: Not applicable       Para-aortic Lymph Nodes Examined:                                    0 Sentinel                                    5 Non-sentinel  5 Total       Para-aortic Lymph Nodes with Metastasis: 0                           Macrometastasis: (>2.0 mm): 0                           Micrometastasis:  (>0.2 mm and < 2.0 mm): 0                           Isolated Tumor Cells (<0.2 mm): 0                           Laterality of Lymph Node with Tumor: Not  applicable                           Extracapsular Extension: Not applicable  Distant Metastasis:       Distant Site(s) Involved: Not applicable  Pathologic Stage Classification (pTNM, AJCC 8th Edition): pT3a, pN0  Ancillary Studies: MMR / MSI testing will be ordered  Representative Tumor Block: C4  Comment(s): Pancytokeratin was performed on the lymph nodes and is  negative.   By immunohistochemistry, HER-2 is EQUIVOCAL (2+).  HER-2 by FISH is pending and will be reported in an addendum.   FLOURESCENCE IN-SITU HYBRIDIZATION RESULTS:   GROUP 1:  HER2 **POSITIVE**   On the tissue sample received from this individual HER2 FISH was performed by a technologist and cell imaging and analysis on the [employer].   RATIO of HER2/CEN 17 SIGNALS: 2.50  AVERAGE HER2 COPY NUMBER PER CELL: 4.74   The ratio of HER2/CEN 17 result exceeds the cutoff value of >=2.0 and a copy number of HER2 signals exceeding the cutoff range of >=4.0 signals per cell.  Arch Pathol Lab Med 1:1, 2018.    10/28/2021 Cancer Staging   Staging form: Corpus Uteri - Carcinoma and Carcinosarcoma, AJCC 8th Edition - Pathologic stage from 10/28/2021: Stage III (pT3, pN0, cM0) - Signed by Heath Lark, MD on 10/28/2021 Stage prefix: Initial diagnosis   11/07/2021 Procedure   Successful placement of a right internal jugular approach power injectable Port-A-Cath. The catheter is ready for immediate use.   11/11/2021 - 02/26/2022 Chemotherapy   Patient is on Treatment Plan : UTERINE Carboplatin AUC 6 / Paclitaxel q21d      Genetic Testing   Negative genetic testing. No pathogenic variants identified on the [facility] Multi-Cancer+RNA panel. VUS in MET called c.3220A>G, VUS in MUTYh called c.1417G>A and VUS in POLE called c.596G>T identified. The report date is 01/20/2022.  The Multi-Cancer Panel + RNA offered by [facility] includes sequencing and/or deletion duplication testing of the following 84 genes: AIP, ALK, APC, ATM, AXIN2,BAP1,  BARD1, BLM, BMPR1A, BRCA1, BRCA2, BRIP1, CASR, CDC73, CDH1, CDK4, CDKN1B, CDKN1C, CDKN2A (p14ARF), CDKN2A (p16INK4a), CEBPA, CHEK2, CTNNA1, DICER1, DIS3L2, EGFR (c.2369C>T, p.Thr790Met variant only), EPCAM (Deletion/duplication testing only), FH, FLCN, GATA2, GPC3, GREM1 (Promoter region deletion/duplication testing only), HOXB13 (c.251G>A, p.Gly84Glu), HRAS, KIT, MAX, MEN1,  MET, MITF (c.952G>A, p.Glu318Lys variant only), MLH1, MSH2, MSH3, MSH6, MUTYH, NBN, NF1, NF2, NTHL1, PALB2, PDGFRA, PHOX2B, PMS2, POLD1, POLE, POT1, PRKAR1A, PTCH1, PTEN, RAD50, RAD51C, RAD51D, RB1, RECQL4, RET, RUNX1, SDHAF2, SDHA (sequence changes only), SDHB, SDHC, SDHD, SMAD4, SMARCA4, SMARCB1, SMARCE1, STK11, SUFU, TERC, TERT, TMEM127, TP53, TSC1, TSC2, VHL, WRN and WT1.  03/30/2022 Imaging   1. Bandlike opacity in the medial left lung apex is progressive in the interval, now with nodular component measuring up to 10 mm. While likely related to evolving atelectasis or scar, consider follow-up CT chest in 3 months to reassess. 2. Interval hysterectomy. 3. No evidence for metastatic disease in the chest, abdomen, or pelvis. 4. Trace free fluid in the upper pelvis, nonspecific. Of note, inferior pelvis is essentially obscured by beam hardening artifact from bilateral hip replacement. 5. Left colonic diverticulosis without diverticulitis. 6. Moderate stool volume. Imaging features could be compatible with constipation in the appropriate clinical setting. 7. Aortic Atherosclerosis (ICD10-I70.0).   06/26/2022 Imaging   1. Increasing conspicuity and size of a previously bandlike and now nodular area of density in the LEFT upper lobe suspicious for metastatic process. PET scan and or biopsy could be helpful for further evaluation. 2. Stable small LEFT retropectoral lymph nodes. These warrant attention on follow-up imaging based on patient history. 3. Added density throughout the LEFT breast associated with surgical clips presumably postoperative and or post treatment related. 4. Aortic atherosclerosis.   Aortic Atherosclerosis (ICD10-I70.0).   Breast cancer of upper-outer quadrant of left female breast ([place])  11/19/2021 Imaging   IMPRESSION: 1. Highly suspicious mass in the left breast at 1 o'clock measuring 1.9 cm.   2.  No mammographic evidence of malignancy in the right breast   11/27/2021  Procedure   Appropriate positioning of the venous shaped biopsy marking clip at the site of biopsy in the 1 o'clock location of the LEFT breast.   12/02/2021 Initial Diagnosis   Breast cancer of upper-outer quadrant of left female breast ([place])   12/02/2021 Cancer Staging   Staging form: Breast, AJCC 8th Edition - Clinical stage from 12/02/2021: cT1c, cN0, cM0 - Signed by Heath Lark, MD on 12/02/2021 Stage prefix: Initial diagnosis    Genetic Testing   Negative genetic testing. No pathogenic variants identified on the [facility] Multi-Cancer+RNA panel. VUS in MET called c.3220A>G, VUS in MUTYh called c.1417G>A and VUS in POLE called c.596G>T identified. The report date is 01/20/2022.  The Multi-Cancer Panel + RNA offered by [facility] includes sequencing and/or deletion duplication testing of the following 84 genes: AIP, ALK, APC, ATM, AXIN2,BAP1,  BARD1, BLM, BMPR1A, BRCA1, BRCA2, BRIP1, CASR, CDC73, CDH1, CDK4, CDKN1B, CDKN1C, CDKN2A (p14ARF), CDKN2A (p16INK4a), CEBPA, CHEK2, CTNNA1, DICER1, DIS3L2, EGFR (c.2369C>T, p.Thr790Met variant only), EPCAM (Deletion/duplication testing only), FH, FLCN, GATA2, GPC3, GREM1 (Promoter region deletion/duplication testing only), HOXB13 (c.251G>A, p.Gly84Glu), HRAS, KIT, MAX, MEN1, MET, MITF (c.952G>A, p.Glu318Lys variant only), MLH1, MSH2, MSH3, MSH6, MUTYH, NBN, NF1, NF2, NTHL1, PALB2, PDGFRA, PHOX2B, PMS2, POLD1, POLE, POT1, PRKAR1A, PTCH1, PTEN, RAD50, RAD51C, RAD51D, RB1, RECQL4, RET, RUNX1, SDHAF2, SDHA (sequence changes only), SDHB, SDHC, SDHD, SMAD4, SMARCA4, SMARCB1, SMARCE1, STK11, SUFU, TERC, TERT, TMEM127, TP53, TSC1, TSC2, VHL, WRN and WT1.   03/30/2022 -  Anti-estrogen oral therapy   She is started on Arimidex   06/16/2022 Pathology Results   FINAL MICROSCOPIC DIAGNOSIS:   A. BREAST, LEFT, LUMPECTOMY:  -  Invasive mammary carcinoma (NST)/invasive ductal carcinoma (NOS) [retained membranous e-cadherin positivity], 1.7 cm in greatest dimension, [place]  histologic score 2 out of 3.  -  Ductal carcinoma in situ (DCIS), cribriform type, nuclear grade 2 of 3.  -  Biomarkers, performed on the biopsy specimen, ER positive (90% strong), PR positive (11 to 50% moderate), HER2/neu negative  -  Margins negative in conjunction with parts a B and C (closest margin anterior at 5  mm and superior at 9 mm all others greater than 10 mm).  pT1c pNX pM n/a   B. BREAST, LEFT POSTERIOR MARGIN, EXCISION:  -  Benign breast tissue, negative for malignancy (new margin 1.5 cm)   C. BREAST, LEFT MEDIAL MARGIN, EXCISION:  -  Benign breast tissue, negative for malignancy (new margin 1.5 cm)   ONCOLOGY TABLE:   INVASIVE CARCINOMA OF THE BREAST:  Resection   Procedure: Lumpectomy  Specimen Laterality: Left  Histologic Type: Invasive mammary carcinoma of no special type  (NST)/invasive ductal carcinoma (NOS)  Histologic Grade:       Glandular (Acinar)/Tubular Differentiation: 3       Nuclear Pleomorphism: 2       Mitotic Rate: 1       Overall Grade: 2  Tumor Size: 1.7 x 1.5 x 1.0 cm  Ductal Carcinoma In Situ: Cribriform type, nuclear grade 2 of 3  Tumor Extent: Limited to breast parenchyma  Treatment Effect in the Breast: It is noted the patient has received  chemotherapy for endometrial carcinoma as well as antrum aromatase  inhibitor without morphologic evidence of tumor response  Margins: All margins negative for invasive carcinoma       Distance from Closest Margin (mm): 5 mm and 9 mm       Specify Closest Margin (required only if <31mm): Anterior (5) and  Superior (9)  DCIS Margins: Uninvolved by DCIS       Distance from Closest Margin (mm): 5 mm       Specify Closest Margin (required only if <41mm): Posterior  Regional Lymph Nodes: Not applicable (no lymph nodes submitted or found)       Number of Lymph Nodes Examined: 0       Number of Sentinel Nodes Examined: 0       Number of Lymph Nodes with Macrometastases (>2 mm): N/A       Number of Lymph  Nodes with Micrometastases: N/A       Number of Lymph Nodes with Isolated Tumor Cells (=0.2 mm or =200  cells): N/A       Size of Largest Metastatic Deposit (mm): N/A       Extranodal Extension: N/A  Distant Metastasis:       Distant Site(s) Involved: N/A  Breast Biomarker Testing Performed on Previous Biopsy:       Testing Performed on Case Number: [id number]             Estrogen Receptor: Positive, >90% strong             Progesterone Receptor: Positive, 11 to 50% moderate             HER2: IHC 0/3; negative             Ki-67: Not performed  Pathologic Stage Classification (pTNM, AJCC 8th Edition): pT1c, pNx  Representative Tumor Block: A5  Comment(s): [None]    06/16/2022 Surgery   Preoperative diagnosis: Left breast cancer, clinical stage I  Procedure: Left breast radioactive seed guided lumpectomy Surgeon: Dr. Serita Grammes     PHYSICAL EXAMINATION: ECOG PERFORMANCE STATUS: 1 - Symptomatic but completely ambulatory  Vitals:   06/29/22 1140  BP: (!) 164/79  Pulse: 62  Resp: 18  SpO2: 100%   Filed Weights   06/29/22 1140  Weight: 182 lb 6.4 oz (82.7 kg)    GENERAL:alert, no distress and comfortable NEURO: alert & oriented x 3 with fluent speech, no focal motor/sensory deficits  LABORATORY DATA:  I have  reviewed the data as listed    Component Value Date/Time   NA 138 06/29/2022 1122   K 4.4 06/29/2022 1122   CL 105 06/29/2022 1122   CO2 28 06/29/2022 1122   GLUCOSE 99 06/29/2022 1122   BUN 16 06/29/2022 1122   CREATININE 0.93 06/29/2022 1122   CALCIUM 9.2 06/29/2022 1122   PROT 7.1 06/29/2022 1122   ALBUMIN 4.1 06/29/2022 1122   AST 21 06/29/2022 1122   ALT 16 06/29/2022 1122   ALKPHOS 75 06/29/2022 1122   BILITOT 0.5 06/29/2022 1122   GFRNONAA >60 06/29/2022 1122    No results found for: "SPEP", "UPEP"  Lab Results  Component Value Date   WBC 4.4 06/29/2022   NEUTROABS 2.4 06/29/2022   HGB 12.8 06/29/2022   HCT 37.8 06/29/2022   MCV 91.7  06/29/2022   PLT 181 06/29/2022      Chemistry      Component Value Date/Time   NA 138 06/29/2022 1122   K 4.4 06/29/2022 1122   CL 105 06/29/2022 1122   CO2 28 06/29/2022 1122   BUN 16 06/29/2022 1122   CREATININE 0.93 06/29/2022 1122      Component Value Date/Time   CALCIUM 9.2 06/29/2022 1122   ALKPHOS 75 06/29/2022 1122   AST 21 06/29/2022 1122   ALT 16 06/29/2022 1122   BILITOT 0.5 06/29/2022 1122       RADIOGRAPHIC STUDIES: I have reviewed multiple CT imaging with the patient and her husband I have personally reviewed the radiological images as listed and agreed with the findings in the report. CT Chest Wo Contrast  Result Date: 06/26/2022 CLINICAL DATA:  History of lung nodules and breast cancer and uterine cancer. * Tracking Code: BO * EXAM: CT CHEST WITHOUT CONTRAST TECHNIQUE: Multidetector CT imaging of the chest was performed following the standard protocol without IV contrast. RADIATION DOSE REDUCTION: This exam was performed according to the departmental dose-optimization program which includes automated exposure control, adjustment of the mA and/or kV according to patient size and/or use of iterative reconstruction technique. COMPARISON:  March 27, 2022 FINDINGS: Cardiovascular: Calcified aortic atherosclerotic changes. No aneurysmal dilation of the thoracic aorta. Normal heart size without pericardial effusion of substantial volume or evidence of nodularity. Central pulmonary vasculature is normal to the extent evaluated on noncontrast imaging. Mediastinum/Nodes: No internal mammary adenopathy. No thoracic inlet lymphadenopathy. No axillary adenopathy by size criteria. There is small LEFT retropectoral lymph nodes largest measuring 8 mm (image 24/2) this is little changed compared to previous imaging. No supraclavicular adenopathy on the LEFT. No mediastinal or hilar adenopathy.  Esophagus grossly normal. Lungs/Pleura: LEFT upper lobe nodule (image 20/3) 11 x 11 mm  previously 10 x 8 mm, overall increasing in size slightly in conspicuity since previous imaging. On coronal and sagittal imaging this appears more discretely nodular than purely bandlike. No consolidation.  No pleural effusion.  Airways are patent. Upper Abdomen: Incidental imaging of upper abdominal contents without acute findings. Imaged portions the liver, gallbladder, pancreas, spleen, adrenal glands and kidneys are unremarkable. Musculoskeletal: Surgical clips in the LEFT breast. Added density throughout the LEFT breast and Verus areas (image 56/2) area measuring 3.0 x 1.9 cm for example is contiguous with another area which measures 2.9 x 2.6 cm and a third area more cephalad and medial measuring 3.0 x 2.7 cm. Spinal degenerative changes. No destructive bony findings or acute bone process. IMPRESSION: 1. Increasing conspicuity and size of a previously bandlike and now nodular area of density in  the LEFT upper lobe suspicious for metastatic process. PET scan and or biopsy could be helpful for further evaluation. 2. Stable small LEFT retropectoral lymph nodes. These warrant attention on follow-up imaging based on patient history. 3. Added density throughout the LEFT breast associated with surgical clips presumably postoperative and or post treatment related. 4. Aortic atherosclerosis. Aortic Atherosclerosis (ICD10-I70.0). Electronically Signed   By: Zetta Bills M.D.   On: 06/26/2022 16:13   MM Breast Surgical Specimen  Result Date: 06/16/2022 CLINICAL DATA:  Evaluate surgical specimen following lumpectomy for LEFT breast cancer. EXAM: SPECIMEN RADIOGRAPH OF THE LEFT BREAST COMPARISON:  Previous exam(s). FINDINGS: Status post excision of the LEFT breast. The radioactive seed and VENUS biopsy marker clip are present and completely intact. IMPRESSION: Specimen radiograph of the LEFT breast. Electronically Signed   By: Margarette Canada M.D.   On: 06/16/2022 09:07  MM LT RADIOACTIVE SEED LOC MAMMO GUIDE  Result  Date: 06/15/2022 CLINICAL DATA:  81 year old female for seed localization of LEFT breast cancer prior to lumpectomy. EXAM: MAMMOGRAPHIC GUIDED RADIOACTIVE SEED LOCALIZATION OF THE LEFT BREAST COMPARISON:  Previous exam(s). FINDINGS: Patient presents for radioactive seed localization prior to LEFT lumpectomy. I met with the patient and we discussed the procedure of seed localization including benefits and alternatives. We discussed the high likelihood of a successful procedure. We discussed the risks of the procedure including infection, bleeding, tissue injury and further surgery. We discussed the low dose of radioactivity involved in the procedure. Informed, written consent was given. The usual time-out protocol was performed immediately prior to the procedure. Using mammographic guidance, sterile technique, 1% lidocaine and an I-125 radioactive seed, the venus clip was localized using a LATERAL approach. The follow-up mammogram images confirm the seed in the expected location and were marked for Dr. Donne Hazel. Follow-up survey of the patient confirms presence of the radioactive seed. Order number of I-125 seed:  [id number]. Total activity:  8.938 millicuries.  Reference Date: 05/14/2022. The patient tolerated the procedure well and [facility]. She was given instructions regarding seed removal. IMPRESSION: Radioactive seed localization LEFT breast. No apparent complications. Electronically Signed   By: Margarette Canada M.D.   On: 06/15/2022 15:07

## 2022-06-29 NOTE — Telephone Encounter (Signed)
-----   Message from Heath Lark, MD sent at 06/29/2022 12:14 PM EDT ----- Regarding: urgent pulm referral I completed my notes Can you call [facility] to set up urgent referral?

## 2022-06-30 ENCOUNTER — Encounter: Payer: Self-pay | Admitting: *Deleted

## 2022-07-01 ENCOUNTER — Encounter (HOSPITAL_COMMUNITY): Payer: Self-pay

## 2022-07-06 ENCOUNTER — Other Ambulatory Visit (HOSPITAL_COMMUNITY): Payer: TRICARE For Life (TFL)

## 2022-07-08 ENCOUNTER — Telehealth: Payer: Self-pay

## 2022-07-08 ENCOUNTER — Encounter (HOSPITAL_COMMUNITY)
Admission: RE | Admit: 2022-07-08 | Discharge: 2022-07-08 | Disposition: A | Discharge status: Home / Self Care | Payer: Medicare Other | Source: Ambulatory Visit | Attending: Hematology and Oncology | Admitting: Hematology and Oncology

## 2022-07-08 DIAGNOSIS — C541 Malignant neoplasm of endometrium: Secondary | ICD-10-CM | POA: Diagnosis present

## 2022-07-08 DIAGNOSIS — C50412 Malignant neoplasm of upper-outer quadrant of left female breast: Secondary | ICD-10-CM | POA: Diagnosis present

## 2022-07-08 LAB — GLUCOSE, CAPILLARY: Glucose-Capillary: 96 mg/dL (ref 70–99)

## 2022-07-08 MED ORDER — FLUDEOXYGLUCOSE F - 18 (FDG) INJECTION
8.9400 | Freq: Once | INTRAVENOUS | Status: AC | PRN
  Administered 2022-07-08: 8.94 via INTRAVENOUS

## 2022-07-08 NOTE — Telephone Encounter (Signed)
I can see her at 220 pm on Monday, please schedule 30 mins appt

## 2022-07-08 NOTE — Telephone Encounter (Signed)
Returned her call. She has PET scheduled today and see pulmonary tomorrow. She is asking if she needs a follow up appt for with Dr. Alvy Bimler.

## 2022-07-08 NOTE — Progress Notes (Unsigned)
Synopsis: Referred for pulmonary nodule by Artis Delay, MD  Subjective:   PATIENT ID: Kristin Carlson GENDER: female DOB: 12/16/1940, MRN: [id number]  No chief complaint on file.  81yF with history of left breast cancer followed by Dr. Bertis Ruddy s/p lumpectomy and on arimidex, enlarging LUL nodule, endometrial cancer s/p resectino and aduvant chemo 2022-2023  Otherwise pertinent review of systems is negative.  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 atypical basaloid cells   Family history of lung cancer    GERD (gastroesophageal reflux disease)    History of basal cell carcinoma 07/29/2021   left thigh, EDC at time of bx   History of radiation therapy    vaginal brachytherapy [facility] 12/18/2021-01/13/2022 Dr Antony Blackbird   HLD (hyperlipidemia)    Hypertension    Hypothyroidism    Pneumonia    HX OF YEARS AGO   PONV (postoperative nausea and vomiting)      Family History  Problem Relation Age of Onset   Cancer Father        Lung Cancer   Cancer Paternal Aunt        mouth   Skin cancer Maternal Grandmother    Skin cancer Paternal Grandmother    Colon cancer Neg Hx    Breast cancer Neg Hx    Ovarian cancer Neg Hx    Endometrial cancer Neg Hx    Pancreatic cancer Neg Hx    Prostate cancer Neg Hx      Past Surgical History:  Procedure Laterality Date   BREAST BIOPSY Left 11/27/2021   u/s bs, venus clip,  INVASIVE MAMMARY CARCINOMA   BREAST CYST ASPIRATION     BREAST LUMPECTOMY WITH RADIOACTIVE SEED LOCALIZATION Left 06/16/2022   Procedure: LEFT BREAST LUMPECTOMY WITH RADIOACTIVE SEED LOCALIZATION;  Surgeon: Emelia Loron, MD;  Location: [facility];  Service: General;  Laterality: Left;   cataract Bilateral    HIP SURGERY     IR IMAGING GUIDED PORT INSERTION   11/07/2021   IR REMOVAL TUN ACCESS W/ PORT W/O FL MOD SED  01/06/2022   KNEE ARTHROSCOPY Left 2003   NECK SURGERY  1997   Fusion C3, C4, C5   ROBOTIC ASSISTED TOTAL HYSTERECTOMY WITH BILATERAL SALPINGO OOPHERECTOMY Bilateral 10/14/2021   Procedure: XI ROBOTIC ASSISTED TOTAL HYSTERECTOMY WITH BILATERAL SALPINGO OOPHORECTOMY, LYMPH NODE DISSECTION,  LAPAROTOMY;  Surgeon: Carver Fila, MD;  Location: [facility];  Service: Gynecology;  Laterality: Bilateral;   SENTINEL NODE BIOPSY N/A 10/14/2021   Procedure: SENTINEL NODE DISSECTION;  Surgeon: Carver Fila, MD;  Location: [facility];  Service: Gynecology;  Laterality: N/A;   THYROIDECTOMY  2007   TOTAL HIP ARTHROPLASTY Bilateral    Left Hip -2015, Right Hip 208    Social History   Socioeconomic History   Marital status: Married    Spouse name: Kristin Carlson   Number of children: 2   Years of education: Not on file   Highest education level: Not on file  Occupational History   Not on file  Tobacco Use   Smoking status: Former    Types: Cigarettes   Smokeless tobacco: Never  Vaping Use   Vaping Use: Never used  Substance and Sexual Activity   Alcohol use: Yes    Comment: RARE   Drug use:  Never   Sexual activity: Not Currently  Other Topics Concern   Not on file  Social History Narrative   Retired Charity fundraiser   Social Determinants of Corporate investment banker Strain: Not on file  Food Insecurity: Not on file  Transportation Needs: Not on file  Physical Activity: Not on file  Stress: Not on file  Social Connections: Not on file  Intimate Partner Violence: Not on file     Allergies  Allergen Reactions   Lactose Other (See Comments)    Gi- Upset   Codeine Nausea And Vomiting   Penicillins Rash   Sulfa Antibiotics Rash   Vancomycin Itching and Rash     Outpatient Medications Prior to Visit  Medication Sig Dispense Refill   anastrozole (ARIMIDEX) 1 MG tablet Take 1 tablet (1 mg total) by mouth daily. 90 tablet 1    Cholecalciferol 50 MCG (2000 UT) CAPS Take 2,000 Units by mouth daily.     gabapentin (NEURONTIN) 300 MG capsule Take 1 capsule (300 mg total) by mouth 2 (two) times daily. 60 capsule 3   losartan (COZAAR) 50 MG tablet Take 1 tablet by mouth daily.     magnesium oxide (MAG-OX) 400 MG tablet Take 200 mg by mouth 2 (two) times daily.     Multiple Vitamins-Minerals (MULTIVITAMIN WOMEN 50+ PO) Take 1 tablet by mouth daily.     Omega-3 1000 MG CAPS Take 1,000 mg by mouth daily.     ondansetron (ZOFRAN) 8 MG tablet Take 1 tablet (8 mg total) by mouth every 8 (eight) hours as needed. (Patient taking differently: Take 8 mg by mouth every 8 (eight) hours as needed for nausea or vomiting.) 30 tablet 1   pantoprazole (PROTONIX) 20 MG tablet Take 20 mg by mouth daily as needed for heartburn or indigestion.     prochlorperazine (COMPAZINE) 10 MG tablet      propranolol (INDERAL) 40 MG tablet Take 40 mg by mouth 2 (two) times daily.     rosuvastatin (CRESTOR) 5 MG tablet Take 5 mg by mouth every evening.     senna-docusate (SENOKOT-S) 8.6-50 MG tablet Take 2 tablets by mouth 2 (two) times daily. 60 tablet 0   Simethicone 125 MG TABS Take 250 mg by mouth 2 (two) times daily as needed (gas).     SYNTHROID 137 MCG tablet Take 137 mcg by mouth daily.     temazepam (RESTORIL) 15 MG capsule Take 15 mg by mouth at bedtime as needed.     Facility-Administered Medications Prior to Visit  Medication Dose Route Frequency Provider Last Rate Last Admin   sodium chloride flush (NS) 0.9 % injection 10 mL  10 mL Intracatheter Once Artis Delay, MD           Objective:   Physical Exam:  General appearance: 81 y.o., female, NAD, conversant  Eyes: anicteric sclerae; PERRL, tracking appropriately HENT: NCAT; MMM Neck: Trachea midline; no lymphadenopathy, no JVD Lungs: CTAB, no crackles, no wheeze, with normal respiratory effort CV: RRR, no murmur  Abdomen: Soft, non-tender; non-distended, BS present  Extremities: No  peripheral edema, warm Skin: Normal turgor and texture; no rash Psych: Appropriate affect Neuro: Alert and oriented to person and place, no focal deficit     There were no vitals filed for this visit.   on *** LPM *** RA BMI Readings from Last 3 Encounters:  06/29/22 31.80 kg/m  06/16/22 31.56 kg/m  05/06/22 30.97 kg/m   Wt Readings from Last 3 Encounters:  06/29/22  182 lb 6.4 oz (82.7 kg)  06/16/22 181 lb (82.1 kg)  05/06/22 180 lb 6.4 oz (81.8 kg)     CBC    Component Value Date/Time   WBC 4.4 06/29/2022 1122   WBC 7.0 01/08/2022 0459   RBC 4.12 06/29/2022 1122   HGB 12.8 06/29/2022 1122   HCT 37.8 06/29/2022 1122   PLT 181 06/29/2022 1122   MCV 91.7 06/29/2022 1122   MCH 31.1 06/29/2022 1122   MCHC 33.9 06/29/2022 1122   RDW 12.4 06/29/2022 1122   LYMPHSABS 1.3 06/29/2022 1122   MONOABS 0.5 06/29/2022 1122   EOSABS 0.2 06/29/2022 1122   BASOSABS 0.0 06/29/2022 1122    ***  Chest Imaging: CT Chest 06/26/22 reviewed by me with enlarging LUL nodule  PET/CT 07/08/22 with PET avid LUL nodule  Pulmonary Functions Testing Results:     No data to display           Echocardiogram:    1. Left ventricular ejection fraction, by estimation, is 60 to 65%. The  left ventricle has normal function. The left ventricle has no regional  wall motion abnormalities. There is mild concentric left ventricular  hypertrophy. Left ventricular diastolic  parameters are consistent with Grade I diastolic dysfunction (impaired  relaxation).   2. Right ventricular systolic function is normal. The right ventricular  size is normal. There is mildly elevated pulmonary artery systolic  pressure.   3. Left atrial size was mildly dilated.   4. The mitral valve is normal in structure. Mild mitral valve  regurgitation. No evidence of mitral stenosis.   5. The aortic valve is tricuspid. There is mild calcification of the  aortic valve. There is mild thickening of the aortic valve.  Aortic valve  regurgitation is mild. Aortic valve sclerosis is present, with no evidence  of aortic valve stenosis.   6. The inferior vena cava is normal in size with greater than 50%  respiratory variability, suggesting right atrial pressure of 3 mmHg.   7. Agitated saline contrast bubble study was negative, with no evidence  of any interatrial shunt.       Assessment & Plan:   # Enlarging, PET avid LUL nodule  Plan: - robotic bronchoscopy under general anesthesia tentatively 8/21     Omar Person, MD [facility] 07/08/2022 12:52 PM

## 2022-07-08 NOTE — Telephone Encounter (Signed)
Appt scheduled and she is aware of appt. She appreciated the call.

## 2022-07-08 NOTE — H&P (View-Only) (Signed)
Synopsis: Referred for pulmonary nodule by Heath Lark, MD  Subjective:   PATIENT ID: Kristin Carlson GENDER: female DOB: [birth year], MRN: [id number]  Chief Complaint  Patient presents with   Pulmonary Consult    Referred by Dr Alvy Bimler for eval of pulmonary nodule. Pt denies any respiratory co's.    81yF with history of left breast cancer followed by Dr. Alvy Bimler s/p lumpectomy and on arimidex, enlarging LUL nodule, endometrial cancer s/p resectino and aduvant chemo 2022-2023  She has only occasional cough. No hemoptysis. No unintentional weight loss. No fever. No CP.   Otherwise pertinent review of systems is negative.  Father died of lung cancer, heavy smoker, PGM had squamous cell ?skin cancer  She was a Marine scientist in occupational health. Smoked 55ya, smoked for 3 years half ppd. Had some significant secondhand exposure growing up with her dad.   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 atypical basaloid cells   Family history of lung cancer    GERD (gastroesophageal reflux disease)    History of basal cell carcinoma 07/29/2021   left thigh, EDC at time of bx   History of radiation therapy    vaginal brachytherapy [facility] 12/18/2021-01/13/2022 Dr Gery Pray   HLD (hyperlipidemia)    Hypertension    Hypothyroidism    Pneumonia    HX OF YEARS AGO   PONV (postoperative nausea and vomiting)      Family History  Problem Relation Age of Onset   Cancer Father        Lung Cancer   Lung cancer Father        smoked   Skin cancer Maternal Grandmother    Skin cancer Paternal Grandmother    Cancer Paternal Aunt        mouth   Colon cancer Neg Hx    Breast cancer Neg Hx    Ovarian cancer Neg Hx    Endometrial cancer Neg Hx    Pancreatic cancer Neg Hx    Prostate cancer Neg Hx      Past  Surgical History:  Procedure Laterality Date   BREAST BIOPSY Left 11/27/2021   u/s bs, venus clip,  INVASIVE MAMMARY CARCINOMA   BREAST CYST ASPIRATION     BREAST LUMPECTOMY WITH RADIOACTIVE SEED LOCALIZATION Left 06/16/2022   Procedure: LEFT BREAST LUMPECTOMY WITH RADIOACTIVE SEED LOCALIZATION;  Surgeon: Rolm Bookbinder, MD;  Location: [facility];  Service: General;  Laterality: Left;   cataract Bilateral    HIP SURGERY     IR IMAGING GUIDED PORT INSERTION  11/07/2021   IR REMOVAL TUN ACCESS W/ PORT W/O FL MOD SED  01/06/2022   KNEE ARTHROSCOPY Left 2003   NECK SURGERY  1997   Fusion C3, C4, C5   ROBOTIC ASSISTED TOTAL HYSTERECTOMY WITH BILATERAL SALPINGO OOPHERECTOMY Bilateral 10/14/2021   Procedure: XI ROBOTIC ASSISTED TOTAL HYSTERECTOMY WITH BILATERAL SALPINGO OOPHORECTOMY, LYMPH NODE DISSECTION,  LAPAROTOMY;  Surgeon: Lafonda Mosses, MD;  Location: [facility];  Service: Gynecology;  Laterality: Bilateral;   SENTINEL NODE BIOPSY N/A 10/14/2021   Procedure: SENTINEL NODE DISSECTION;  Surgeon: Lafonda Mosses, MD;  Location: [facility];  Service: Gynecology;  Laterality: N/A;   THYROIDECTOMY  2007   TOTAL HIP ARTHROPLASTY Bilateral    Left Hip -2015, Right Hip 208  Social History   Socioeconomic History   Marital status: Married    Spouse name: Herbie Baltimore   Number of children: 2   Years of education: Not on file   Highest education level: Not on file  Occupational History   Not on file  Tobacco Use   Smoking status: Former    Packs/day: 0.50    Years: 3.00    Total pack years: 1.50    Types: Cigarettes    Quit date: 11/30/1966    Years since quitting: 55.6   Smokeless tobacco: Never  Vaping Use   Vaping Use: Never used  Substance and Sexual Activity   Alcohol use: Yes    Comment: RARE   Drug use: Never   Sexual activity: Not Currently  Other Topics Concern   Not on file  Social History Narrative   Retired Therapist, sports   Social Determinants of Adult nurse Strain: Not on file  Food Insecurity: Not on file  Transportation Needs: Not on file  Physical Activity: Not on file  Stress: Not on file  Social Connections: Not on file  Intimate Partner Violence: Not on file     Allergies  Allergen Reactions   Lactose Other (See Comments)    Gi- Upset   Codeine Nausea And Vomiting   Penicillins Rash   Sulfa Antibiotics Rash   Vancomycin Itching and Rash     Outpatient Medications Prior to Visit  Medication Sig Dispense Refill   anastrozole (ARIMIDEX) 1 MG tablet Take 1 tablet (1 mg total) by mouth daily. 90 tablet 1   Cholecalciferol 50 MCG (2000 UT) CAPS Take 2,000 Units by mouth daily.     gabapentin (NEURONTIN) 300 MG capsule Take 1 capsule (300 mg total) by mouth 2 (two) times daily. 60 capsule 3   losartan (COZAAR) 50 MG tablet Take 1 tablet by mouth daily.     magnesium oxide (MAG-OX) 400 MG tablet Take 200 mg by mouth 2 (two) times daily.     Multiple Vitamins-Minerals (MULTIVITAMIN WOMEN 50+ PO) Take 1 tablet by mouth daily.     Omega-3 1000 MG CAPS Take 1,000 mg by mouth daily.     ondansetron (ZOFRAN) 8 MG tablet Take 1 tablet (8 mg total) by mouth every 8 (eight) hours as needed. (Patient taking differently: Take 8 mg by mouth every 8 (eight) hours as needed for nausea or vomiting.) 30 tablet 1   pantoprazole (PROTONIX) 20 MG tablet Take 20 mg by mouth daily as needed for heartburn or indigestion.     prochlorperazine (COMPAZINE) 10 MG tablet      propranolol (INDERAL) 40 MG tablet Take 40 mg by mouth 2 (two) times daily.     rosuvastatin (CRESTOR) 5 MG tablet Take 5 mg by mouth every evening.     senna-docusate (SENOKOT-S) 8.6-50 MG tablet Take 2 tablets by mouth 2 (two) times daily. 60 tablet 0   Simethicone 125 MG TABS Take 250 mg by mouth 2 (two) times daily as needed (gas).     SYNTHROID 137 MCG tablet Take 137 mcg by mouth daily.     temazepam (RESTORIL) 15 MG capsule Take 15 mg by mouth at bedtime as  needed.     Facility-Administered Medications Prior to Visit  Medication Dose Route Frequency Provider Last Rate Last Admin   sodium chloride flush (NS) 0.9 % injection 10 mL  10 mL Intracatheter Once Heath Lark, MD           Objective:  Physical Exam:  General appearance: 81 y.o., female, NAD, conversant  Eyes: anicteric sclerae; PERRL, tracking appropriately HENT: NCAT; MMM Neck: Trachea midline; no lymphadenopathy, no JVD Lungs: CTAB, no crackles, no wheeze, with normal respiratory effort CV: RRR, no murmur  Abdomen: Soft, non-tender; non-distended, BS present  Extremities: No peripheral edema, warm Skin: Normal turgor and texture; no rash Psych: Appropriate affect Neuro: Alert and oriented to person and place, no focal deficit     Vitals:   07/09/22 1038  BP: 122/78  Pulse: 60  Temp: 98.3 F (36.8 C)  TempSrc: Oral  SpO2: 99%  Weight: 182 lb 12.8 oz (82.9 kg)  Height: '5\' 3"'$  (1.6 m)   99% on RA BMI Readings from Last 3 Encounters:  07/09/22 32.38 kg/m  06/29/22 31.80 kg/m  06/16/22 31.56 kg/m   Wt Readings from Last 3 Encounters:  07/09/22 182 lb 12.8 oz (82.9 kg)  06/29/22 182 lb 6.4 oz (82.7 kg)  06/16/22 181 lb (82.1 kg)     CBC    Component Value Date/Time   WBC 4.4 06/29/2022 1122   WBC 7.0 01/08/2022 0459   RBC 4.12 06/29/2022 1122   HGB 12.8 06/29/2022 1122   HCT 37.8 06/29/2022 1122   PLT 181 06/29/2022 1122   MCV 91.7 06/29/2022 1122   MCH 31.1 06/29/2022 1122   MCHC 33.9 06/29/2022 1122   RDW 12.4 06/29/2022 1122   LYMPHSABS 1.3 06/29/2022 1122   MONOABS 0.5 06/29/2022 1122   EOSABS 0.2 06/29/2022 1122   BASOSABS 0.0 06/29/2022 1122     Chest Imaging: CT Chest 06/26/22 reviewed by me with enlarging LUL nodule  PET/CT 07/08/22 with PET avid LUL nodule  Pulmonary Functions Testing Results:     No data to display           Echocardiogram:    1. Left ventricular ejection fraction, by estimation, is 60 to 65%. The   left ventricle has normal function. The left ventricle has no regional  wall motion abnormalities. There is mild concentric left ventricular  hypertrophy. Left ventricular diastolic  parameters are consistent with Grade I diastolic dysfunction (impaired  relaxation).   2. Right ventricular systolic function is normal. The right ventricular  size is normal. There is mildly elevated pulmonary artery systolic  pressure.   3. Left atrial size was mildly dilated.   4. The mitral valve is normal in structure. Mild mitral valve  regurgitation. No evidence of mitral stenosis.   5. The aortic valve is tricuspid. There is mild calcification of the  aortic valve. There is mild thickening of the aortic valve. Aortic valve  regurgitation is mild. Aortic valve sclerosis is present, with no evidence  of aortic valve stenosis.   6. The inferior vena cava is normal in size with greater than 50%  respiratory variability, suggesting right atrial pressure of 3 mmHg.   7. Agitated saline contrast bubble study was negative, with no evidence  of any interatrial shunt.       Assessment & Plan:   # Enlarging, PET avid LUL nodule Discussed risks of pneumothorax (1% at our institution), average to below average risk of respiratory failure (<5%), major bleeding (11/998).   Plan: - robotic bronchoscopy under general anesthesia tomorrow - may require super D CT Chest tomorrow morning before the procedure - will not take her fish oil tomorrow     Maryjane Hurter, MD [facility] 07/09/2022 1:05 PM

## 2022-07-09 ENCOUNTER — Ambulatory Visit (INDEPENDENT_AMBULATORY_CARE_PROVIDER_SITE_OTHER): Payer: Medicare Other | Admitting: Student

## 2022-07-09 ENCOUNTER — Encounter (HOSPITAL_COMMUNITY): Payer: Self-pay | Admitting: Student

## 2022-07-09 ENCOUNTER — Encounter: Payer: Self-pay | Admitting: *Deleted

## 2022-07-09 ENCOUNTER — Other Ambulatory Visit: Payer: Self-pay

## 2022-07-09 ENCOUNTER — Encounter: Payer: Self-pay | Admitting: Student

## 2022-07-09 VITALS — BP 122/78 | HR 60 | Temp 98.3°F | Ht 63.0 in | Wt 182.8 lb

## 2022-07-09 DIAGNOSIS — R911 Solitary pulmonary nodule: Secondary | ICD-10-CM | POA: Diagnosis not present

## 2022-07-09 NOTE — Anesthesia Preprocedure Evaluation (Addendum)
Anesthesia Evaluation  Patient identified by MRN, date of birth, ID band Patient awake    Reviewed: Allergy & Precautions, NPO status , Patient's Chart, lab work & pertinent test results, reviewed documented beta blocker date and time   History of Anesthesia Complications (+) PONV and history of anesthetic complications  Airway Mallampati: II  TM Distance: >3 FB Neck ROM: Full    Dental no notable dental hx. (+) Teeth Intact, Dental Advisory Given   Pulmonary neg pulmonary ROS, former smoker,    Pulmonary exam normal breath sounds clear to auscultation       Cardiovascular hypertension, Pt. on home beta blockers and Pt. on medications negative cardio ROS Normal cardiovascular exam Rhythm:Regular Rate:Normal  ECHO 7/23 EF-65%  Valves -nl   Neuro/Psych negative neurological ROS  negative psych ROS   GI/Hepatic negative GI ROS, Neg liver ROS, GERD  Medicated,  Endo/Other  negative endocrine ROSHypothyroidism   Renal/GU negative Renal ROS  negative genitourinary   Musculoskeletal negative musculoskeletal ROS (+) Arthritis ,   Abdominal   Peds negative pediatric ROS (+)  Hematology negative hematology ROS (+) Blood dyscrasia, anemia ,   Anesthesia Other Findings   Reproductive/Obstetrics negative OB ROS                          Anesthesia Physical  Anesthesia Plan  ASA: 3  Anesthesia Plan: General   Post-op Pain Management:    Induction: Intravenous  PONV Risk Score and Plan: 4 or greater and Ondansetron, Dexamethasone, Treatment may vary due to age or medical condition and TIVA  Airway Management Planned: Oral ETT  Additional Equipment: None  Intra-op Plan:   Post-operative Plan: Extubation in OR  Informed Consent: I have reviewed the patients History and Physical, chart, labs and discussed the procedure including the risks, benefits and alternatives for the proposed anesthesia  with the patient or authorized representative who has indicated his/her understanding and acceptance.     Dental advisory given  Plan Discussed with: CRNA and Anesthesiologist  Anesthesia Plan Comments:       Anesthesia Quick Evaluation

## 2022-07-09 NOTE — Patient Instructions (Addendum)
-   We will shoot for robotic bronchoscopy under general anesthesia either tomorrow or more likely 07/20/22. If you have any questions about the procedure or results afterward, call [phone] or send me a my chart message.  - Nothing to eat or drink after midnight the night before your procedure.  - See you soon!

## 2022-07-09 NOTE — Progress Notes (Signed)
Spoke with pt for pre-op call. Pt denies cardiac history but is treated for Hypertension. Pt states she is not diabetic.   Shower instructions given to pt and she voiced understanding. Pt states she has CHG soap to use.

## 2022-07-10 ENCOUNTER — Ambulatory Visit (HOSPITAL_COMMUNITY): Payer: Medicare Other

## 2022-07-10 ENCOUNTER — Encounter (HOSPITAL_COMMUNITY): Payer: Self-pay | Admitting: Student

## 2022-07-10 ENCOUNTER — Other Ambulatory Visit: Payer: Self-pay

## 2022-07-10 ENCOUNTER — Encounter (HOSPITAL_COMMUNITY)
Admission: RE | Disposition: A | Discharge status: Home / Self Care | Payer: Self-pay | Source: Home / Self Care | Attending: Student

## 2022-07-10 ENCOUNTER — Ambulatory Visit (HOSPITAL_COMMUNITY)
Admission: RE | Admit: 2022-07-10 | Discharge: 2022-07-10 | Disposition: A | Discharge status: Home / Self Care | Payer: Medicare Other | Attending: Student | Admitting: Student

## 2022-07-10 ENCOUNTER — Ambulatory Visit (HOSPITAL_COMMUNITY): Payer: Medicare Other | Admitting: Anesthesiology

## 2022-07-10 ENCOUNTER — Ambulatory Visit (HOSPITAL_BASED_OUTPATIENT_CLINIC_OR_DEPARTMENT_OTHER): Payer: Medicare Other | Admitting: Anesthesiology

## 2022-07-10 DIAGNOSIS — Z87891 Personal history of nicotine dependence: Secondary | ICD-10-CM

## 2022-07-10 DIAGNOSIS — R911 Solitary pulmonary nodule: Secondary | ICD-10-CM

## 2022-07-10 DIAGNOSIS — I10 Essential (primary) hypertension: Secondary | ICD-10-CM | POA: Diagnosis not present

## 2022-07-10 DIAGNOSIS — Z853 Personal history of malignant neoplasm of breast: Secondary | ICD-10-CM | POA: Insufficient documentation

## 2022-07-10 DIAGNOSIS — Z801 Family history of malignant neoplasm of trachea, bronchus and lung: Secondary | ICD-10-CM | POA: Diagnosis not present

## 2022-07-10 DIAGNOSIS — K219 Gastro-esophageal reflux disease without esophagitis: Secondary | ICD-10-CM | POA: Insufficient documentation

## 2022-07-10 DIAGNOSIS — E039 Hypothyroidism, unspecified: Secondary | ICD-10-CM | POA: Diagnosis not present

## 2022-07-10 DIAGNOSIS — Z7722 Contact with and (suspected) exposure to environmental tobacco smoke (acute) (chronic): Secondary | ICD-10-CM | POA: Diagnosis not present

## 2022-07-10 DIAGNOSIS — Z20822 Contact with and (suspected) exposure to covid-19: Secondary | ICD-10-CM | POA: Diagnosis not present

## 2022-07-10 DIAGNOSIS — D638 Anemia in other chronic diseases classified elsewhere: Secondary | ICD-10-CM

## 2022-07-10 HISTORY — PX: BRONCHIAL NEEDLE ASPIRATION BIOPSY: SHX5106

## 2022-07-10 HISTORY — PX: FIDUCIAL MARKER PLACEMENT: SHX6858

## 2022-07-10 HISTORY — PX: BRONCHIAL WASHINGS: SHX5105

## 2022-07-10 HISTORY — PX: BRONCHIAL BRUSHINGS: SHX5108

## 2022-07-10 HISTORY — DX: Anemia, unspecified: D64.9

## 2022-07-10 HISTORY — PX: BRONCHIAL BIOPSY: SHX5109

## 2022-07-10 HISTORY — PX: VIDEO BRONCHOSCOPY WITH RADIAL ENDOBRONCHIAL ULTRASOUND: SHX6849

## 2022-07-10 LAB — SARS CORONAVIRUS 2 BY RT PCR: SARS Coronavirus 2 by RT PCR: NEGATIVE

## 2022-07-10 SURGERY — BRONCHOSCOPY, WITH BIOPSY USING ELECTROMAGNETIC NAVIGATION
Anesthesia: General

## 2022-07-10 MED ORDER — MEPERIDINE HCL 25 MG/ML IJ SOLN: 6.2500 mg | INTRAMUSCULAR | Status: DC | PRN

## 2022-07-10 MED ORDER — FENTANYL CITRATE (PF) 250 MCG/5ML IJ SOLN
INTRAMUSCULAR | Status: DC | PRN
  Administered 2022-07-10 (×2): 50 ug via INTRAVENOUS

## 2022-07-10 MED ORDER — LACTATED RINGERS IV SOLN
INTRAVENOUS | Status: DC
Start: 2022-07-10 — End: 2022-07-10

## 2022-07-10 MED ORDER — LIDOCAINE 2% (20 MG/ML) 5 ML SYRINGE
INTRAMUSCULAR | Status: DC | PRN
  Administered 2022-07-10: 100 mg via INTRAVENOUS

## 2022-07-10 MED ORDER — SUGAMMADEX SODIUM 200 MG/2ML IV SOLN
INTRAVENOUS | Status: DC | PRN
  Administered 2022-07-10: 200 mg via INTRAVENOUS

## 2022-07-10 MED ORDER — PHENYLEPHRINE HCL-NACL 20-0.9 MG/250ML-% IV SOLN
INTRAVENOUS | Status: DC | PRN
  Administered 2022-07-10: 20 ug/min via INTRAVENOUS

## 2022-07-10 MED ORDER — ACETAMINOPHEN 325 MG PO TABS: 325.0000 mg | ORAL_TABLET | ORAL | Status: DC | PRN

## 2022-07-10 MED ORDER — FENTANYL CITRATE (PF) 100 MCG/2ML IJ SOLN
INTRAMUSCULAR | Status: AC
  Filled 2022-07-10: qty 2

## 2022-07-10 MED ORDER — FENTANYL CITRATE (PF) 100 MCG/2ML IJ SOLN: 25.0000 ug | INTRAMUSCULAR | Status: DC | PRN

## 2022-07-10 MED ORDER — OXYCODONE HCL 5 MG PO TABS: 5.0000 mg | ORAL_TABLET | Freq: Once | ORAL | Status: DC | PRN

## 2022-07-10 MED ORDER — ACETAMINOPHEN 500 MG PO TABS
1000.0000 mg | ORAL_TABLET | Freq: Once | ORAL | Status: AC
  Administered 2022-07-10: 1000 mg via ORAL
  Filled 2022-07-10: qty 2

## 2022-07-10 MED ORDER — PROPOFOL 500 MG/50ML IV EMUL
INTRAVENOUS | Status: DC | PRN
  Administered 2022-07-10: 100 ug/kg/min via INTRAVENOUS

## 2022-07-10 MED ORDER — ONDANSETRON HCL 4 MG/2ML IJ SOLN
INTRAMUSCULAR | Status: DC | PRN
  Administered 2022-07-10: 4 mg via INTRAVENOUS

## 2022-07-10 MED ORDER — ACETAMINOPHEN 160 MG/5ML PO SOLN: 325.0000 mg | ORAL | Status: DC | PRN

## 2022-07-10 MED ORDER — ONDANSETRON HCL 4 MG/2ML IJ SOLN: 4.0000 mg | Freq: Once | INTRAMUSCULAR | Status: DC | PRN

## 2022-07-10 MED ORDER — ROCURONIUM BROMIDE 10 MG/ML (PF) SYRINGE
PREFILLED_SYRINGE | INTRAVENOUS | Status: DC | PRN
  Administered 2022-07-10: 60 mg via INTRAVENOUS
  Administered 2022-07-10: 15 mg via INTRAVENOUS

## 2022-07-10 MED ORDER — DEXAMETHASONE SODIUM PHOSPHATE 10 MG/ML IJ SOLN
INTRAMUSCULAR | Status: DC | PRN
  Administered 2022-07-10: 10 mg via INTRAVENOUS

## 2022-07-10 MED ORDER — OXYCODONE HCL 5 MG/5ML PO SOLN: 5.0000 mg | Freq: Once | ORAL | Status: DC | PRN

## 2022-07-10 MED ORDER — PROPOFOL 10 MG/ML IV BOLUS
INTRAVENOUS | Status: DC | PRN
  Administered 2022-07-10: 150 mg via INTRAVENOUS

## 2022-07-10 MED ORDER — CHLORHEXIDINE GLUCONATE 0.12 % MT SOLN
15.0000 mL | Freq: Once | OROMUCOSAL | Status: AC
  Administered 2022-07-10: 15 mL via OROMUCOSAL
  Filled 2022-07-10 (×2): qty 15

## 2022-07-10 SURGICAL SUPPLY — 1 items: Superlock fiducial marker ×1 IMPLANT

## 2022-07-10 NOTE — Discharge Instructions (Signed)
-   Would wait till day after tomorrow to resume fish oil supplements - We will call with results of bronchoscopy, you can also call our clinic at [phone] or send my chart message to myself or Dr. Alvy Bimler with any questions about your results.

## 2022-07-10 NOTE — Anesthesia Postprocedure Evaluation (Signed)
Anesthesia Post Note  Patient: Kristin Carlson  Procedure(s) Performed: ROBOTIC ASSISTED NAVIGATIONAL BRONCHOSCOPY BRONCHIAL NEEDLE ASPIRATION BIOPSIES BRONCHIAL BRUSHINGS RADIAL ENDOBRONCHIAL ULTRASOUND BRONCHIAL BIOPSIES BRONCHIAL WASHINGS FIDUCIAL MARKER PLACEMENT     Patient location during evaluation: PACU Anesthesia Type: General Level of consciousness: awake and alert Pain management: pain level controlled Vital Signs Assessment: post-procedure vital signs reviewed and stable Respiratory status: spontaneous breathing, nonlabored ventilation, respiratory function stable and patient connected to nasal cannula oxygen Cardiovascular status: blood pressure returned to baseline and stable Postop Assessment: no apparent nausea or vomiting Anesthetic complications: no   No notable events documented.  Last Vitals:  Vitals:   07/10/22 0945 07/10/22 1000  BP: 112/83 (!) 144/71  Pulse: 64 60  Resp: 16 11  Temp:  36.7 C  SpO2: 92% 93%    Last Pain:  Vitals:   07/10/22 1000  TempSrc:   PainSc: 0-No pain                 Silver Parkey

## 2022-07-10 NOTE — Anesthesia Procedure Notes (Signed)
Procedure Name: Intubation Date/Time: 07/10/2022 7:41 AM  Performed by: Lowella Dell, CRNAPre-anesthesia Checklist: Patient identified, Emergency Drugs available, Suction available and Patient being monitored Patient Re-evaluated:Patient Re-evaluated prior to induction Oxygen Delivery Method: Circle System Utilized Preoxygenation: Pre-oxygenation with 100% oxygen Induction Type: IV induction Ventilation: Mask ventilation without difficulty Laryngoscope Size: Mac and 3 Grade View: Grade I Tube type: Oral Tube size: 8.5 mm Number of attempts: 1 Airway Equipment and Method: Stylet Placement Confirmation: ETT inserted through vocal cords under direct vision, positive ETCO2 and breath sounds checked- equal and bilateral Secured at: 21 cm Tube secured with: Tape Dental Injury: Teeth and Oropharynx as per pre-operative assessment

## 2022-07-10 NOTE — Interval H&P Note (Signed)
History and Physical Interval Note:  07/10/2022 7:05 AM  Kristin Carlson  has presented today for surgery, with the diagnosis of pulmonary nodule.  The various methods of treatment have been discussed with the patient and family. After consideration of risks, benefits and other options for treatment, the patient has consented to  Procedure(s): ROBOTIC ASSISTED NAVIGATIONAL BRONCHOSCOPY (N/A) as a surgical intervention.  The patient's history has been reviewed, patient examined, no change in status, stable for surgery.  I have reviewed the patient's chart and labs.  Questions were answered to the patient's satisfaction.     Maryjane Hurter

## 2022-07-10 NOTE — Op Note (Cosign Needed Addendum)
Video Bronchoscopy with Robotic Assisted Bronchoscopic Navigation   Date of Operation: 07/10/2022   Pre-op Diagnosis: Left upper lobe pulmonary nodule  Post-op Diagnosis: Left upper lobe pulmonary nodule  Surgeon: Walker Shadow  Assistants: Londell Moh  Anesthesia: General endotracheal anesthesia  Operation: Flexible video fiberoptic bronchoscopy with robotic assistance and biopsies.  Estimated Blood Loss: Minimal  Complications: None  Indications and History: Kristin Carlson is a 81 y.o. female with history of breast cancer, endometrial cancer who was referred for pet-avid left upper lobe nodule. The risks, benefits, complications, treatment options and expected outcomes were discussed with the patient.  The possibilities of pneumothorax, pneumonia, reaction to medication, pulmonary aspiration, perforation of a viscus, bleeding, failure to diagnose a condition and creating a complication requiring transfusion or operation were discussed with the patient who freely signed the consent.    Description of Procedure: The patient was seen in the Preoperative Area, was examined and was deemed appropriate to proceed.  The patient was taken to [facility] endoscopy room 1, identified as Kristin Carlson and the procedure verified as Flexible Video Fiberoptic Bronchoscopy.  A Time Out was held and the above information confirmed.   Prior to the date of the procedure a high-resolution CT scan of the chest was performed. Utilizing ION software program a virtual tracheobronchial tree was generated to allow the creation of distinct navigation pathways to the patient's parenchymal abnormalities. After being taken to the operating room general anesthesia was initiated and the patient  was orally intubated. The video fiberoptic bronchoscope was introduced via the endotracheal tube and a general inspection was performed which showed normal right and left lung anatomy, aspiration of the bilateral mainstems was completed to  remove any remaining secretions. Robotic catheter inserted into patient's endotracheal tube.   Target #1 PET-avid left upper lobe 1.1cm nodule: The distinct navigation pathways prepared prior to this procedure were then utilized to navigate to patient's lesion identified on CT scan. CIOS imaging was used to aid navigation and confirm ideal location for biopsy and confirmed needle tip in lesion. The robotic catheter was secured into place and the vision probe was withdrawn.  Lesion location was approximated using fluoroscopy and radial endobronchial ultrasound for peripheral targeting. Under fluoroscopic guidance transbronchial needle brushings, transbronchial needle biopsies, and transbronchial forceps biopsies were performed to be sent for cytology and pathology. A fiducial marker was then placed at the tip of the catheter. A bronchioalveolar lavage was performed in this location and sent for cytology.     At the end of the procedure a general airway inspection was performed and there was no evidence of active bleeding. The bronchoscope was removed.  The patient tolerated the procedure well. There was no significant blood loss and there were no obvious complications. A post-procedural chest x-ray is pending.  Samples Target #1: 1. Transbronchial needle brushings from left upper lobe nodule 2. Transbronchial Wang needle biopsies from left upper lobe nodule 3. Transbronchial forceps biopsies from left upper lobe nodule 4. Bronchoalveolar lavage from left upper lobe nodule 5. Endobronchial biopsies from left upper lobe nodule   Plans:  The patient will be discharged from the PACU to home when recovered from anesthesia and after chest x-ray is reviewed. We will review the cytology, pathology and microbiology results with the patient when they become available. Outpatient followup will be with Dr. Alvy Bimler on 07/13/22.

## 2022-07-10 NOTE — Transfer of Care (Signed)
Immediate Anesthesia Transfer of Care Note  Patient: Kristin Carlson  Procedure(s) Performed: ROBOTIC ASSISTED NAVIGATIONAL BRONCHOSCOPY BRONCHIAL NEEDLE ASPIRATION BIOPSIES BRONCHIAL BRUSHINGS RADIAL ENDOBRONCHIAL ULTRASOUND BRONCHIAL BIOPSIES BRONCHIAL WASHINGS FIDUCIAL MARKER PLACEMENT  Patient Location: PACU  Anesthesia Type:General  Level of Consciousness: awake, alert , oriented and patient cooperative  Airway & Oxygen Therapy: Patient Spontanous Breathing and Patient connected to nasal cannula oxygen  Post-op Assessment: Report given to RN and Post -op Vital signs reviewed and stable  Post vital signs: Reviewed and stable  Last Vitals:  Vitals Value Taken Time  BP    Temp    Pulse 70 07/10/22 0930  Resp 16 07/10/22 0930  SpO2 91 % 07/10/22 0930  Vitals shown include unvalidated device data.  Last Pain:  Vitals:   07/10/22 0630  TempSrc:   PainSc: 0-No pain         Complications: No notable events documented.

## 2022-07-11 ENCOUNTER — Encounter (HOSPITAL_COMMUNITY): Payer: Self-pay | Admitting: Student

## 2022-07-13 ENCOUNTER — Encounter: Payer: Self-pay | Admitting: *Deleted

## 2022-07-13 ENCOUNTER — Inpatient Hospital Stay: Payer: Medicare Other | Attending: Gynecologic Oncology | Admitting: Hematology and Oncology

## 2022-07-13 ENCOUNTER — Encounter: Payer: Self-pay | Admitting: Hematology and Oncology

## 2022-07-13 ENCOUNTER — Other Ambulatory Visit: Payer: Self-pay

## 2022-07-13 DIAGNOSIS — C50412 Malignant neoplasm of upper-outer quadrant of left female breast: Secondary | ICD-10-CM | POA: Diagnosis present

## 2022-07-13 DIAGNOSIS — C541 Malignant neoplasm of endometrium: Secondary | ICD-10-CM | POA: Insufficient documentation

## 2022-07-13 DIAGNOSIS — R911 Solitary pulmonary nodule: Secondary | ICD-10-CM | POA: Diagnosis not present

## 2022-07-13 LAB — CYTOLOGY - NON PAP

## 2022-07-13 NOTE — Progress Notes (Signed)
[place] OFFICE PROGRESS NOTE  Patient Care Team: [name], FNP as PCP - General (Nurse Practitioner)  ASSESSMENT & PLAN:  Pulmonary nodule seen on imaging study I have reviewed recent procedure note Pathology is pending She tolerated recent procedure without difficulties We discussed potential outcome and different potential pathology It is possible she either had metastatic breast cancer, metastatic uterine cancer or new primary lung cancer I think with all 3 types of disease, she would benefit from radiation oncology consultation We discussed the risk and benefits of referring her to see cardiothoracic surgery However, if the pathology review a completely different new malignancy, further work-up might be needed If she has metastatic breast cancer, she can benefit from addition of treatment If she has metastatic uterine cancer, she could benefit from immunotherapy It is too difficult to discuss right now in the absence of final pathology report She is interested to get radiation treatment closer to home I will call her on Friday when I return back to work to determine the next step  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   All questions were answered. The patient knows to call the clinic with any problems, questions or concerns. The total time spent in the appointment was 30 minutes encounter with patients including review of chart and various tests results, discussions about plan of care and coordination of care plan   Heath Lark, MD 07/13/2022 3:24 PM  INTERVAL HISTORY: Please see below for problem oriented charting. she returns for  treatment follow-up and review of test results and further discussion related to recent abnormal pet imaging She tolerated recent bronchoscopy and biopsy well She complained of excessive sedation due to recent addition of gabapentin for hot flashes It is helping her to cope with hot flashes Both the patient and her husband have numerous questions related to future plan of care  REVIEW OF SYSTEMS:   Constitutional: Denies fevers, chills or abnormal weight loss Eyes: Denies blurriness of vision Ears, nose, mouth, throat, and face: Denies mucositis or sore throat Respiratory: Denies cough, dyspnea or wheezes Cardiovascular: Denies palpitation, chest discomfort or lower extremity swelling Gastrointestinal:  Denies nausea, heartburn or change in bowel habits Skin: Denies abnormal skin rashes Lymphatics: Denies new lymphadenopathy or easy bruising Neurological:Denies numbness, tingling or new weaknesses Behavioral/Psych: Mood is stable, no new changes  All other systems were reviewed with the patient and are negative.  I have reviewed the past medical history, past surgical history, social history and family history with the patient and they are unchanged from previous note.  ALLERGIES:  is allergic to lactose, codeine, penicillins, sulfa antibiotics, and vancomycin.  MEDICATIONS:  Current Outpatient Medications  Medication Sig Dispense Refill   acetaminophen (TYLENOL) 500 MG tablet Take 1,000 mg by mouth every 6 (six) hours as needed (pain.).     anastrozole (ARIMIDEX) 1 MG tablet Take 1 tablet (1 mg total) by mouth daily. (Patient taking differently: Take 1 mg by mouth every evening.) 90 tablet 1   Cholecalciferol (VITAMIN D3) 50 MCG (2000 UT) TABS Take 2,000 Units by mouth in the morning.     furosemide (LASIX) 20 MG tablet Take 10 mg by mouth  in the morning. Hold for activities/errands     gabapentin (NEURONTIN) 300 MG capsule Take 1 capsule (300 mg total) by mouth 2 (two) times daily. 60  capsule 3   ibuprofen (ADVIL) 200 MG tablet Take 400 mg by mouth every 8 (eight) hours as needed (severe pain.).     levothyroxine (SYNTHROID) 125 MCG tablet Take 125 mcg by mouth daily before breakfast.     losartan (COZAAR) 50 MG tablet Take 50 mg by mouth in the morning.     magnesium oxide (MAG-OX) 400 MG tablet Take 400 mg by mouth at bedtime.     Multiple Vitamin (MULTIVITAMIN WITH MINERALS) TABS tablet Take 1 tablet by mouth in the morning.     Omega-3 1000 MG CAPS Take 1,000-2,000 mg by mouth See admin instructions. Take 2 capsules by mouth in the morning & take 1 capsule by mouth at night.     ondansetron (ZOFRAN) 8 MG tablet Take 1 tablet (8 mg total) by mouth every 8 (eight) hours as needed. (Patient taking differently: Take 8 mg by mouth every 8 (eight) hours as needed for nausea or vomiting.) 30 tablet 1   pantoprazole (PROTONIX) 20 MG tablet Take 20 mg by mouth daily as needed for heartburn or indigestion.     prochlorperazine (COMPAZINE) 10 MG tablet Take 10 mg by mouth every 6 (six) hours as needed for nausea or vomiting.     propranolol (INDERAL) 40 MG tablet Take 40 mg by mouth 2 (two) times daily.     rosuvastatin (CRESTOR) 5 MG tablet Take 5 mg by mouth at bedtime.     senna-docusate (SENOKOT-S) 8.6-50 MG tablet Take 2 tablets by mouth 2 (two) times daily. (Patient taking differently: Take 2 tablets by mouth 2 (two) times daily as needed (constipation.).) 60 tablet 0   Simethicone 125 MG TABS Take 250 mg by mouth 2 (two) times daily as needed (gas).     temazepam (RESTORIL) 15 MG capsule Take 15 mg by mouth at bedtime as needed for sleep.     No current facility-administered medications for this visit.   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 Evaluation of the pelvis is significantly limited by dense metallic streak artifact from adjacent hip arthroplasty. Within this limitation, there is expansile, masslike, heterogeneously enhancing appearance of the endometrium, measuring at least 5.2 cm in thickness. Findings are in keeping with reported diagnosis of endometrial malignancy. 2. No evidence of lymphadenopathy or metastatic disease in the chest, abdomen, or pelvis. 3. There is a 0.4 cm fissural nodule the superior segment left lower lobe, almost certainly a benign intrapulmonary lymph node. Attention on follow-up. 4. Coronary artery disease.   10/14/2021 Surgery   TRH/BSO, right SLN biopsy, left pelvic and PA LND, peritoneal nodule biopsy, mini-lap for specimen delivery  Findings: On EUA, 8cm bulbous uterus. ON intra-abdominal entry, normal upper abdominal survey. Normal omentum, small and large bowel. Uterus 8-10cm and bulbous. Normal appearing adnexa. Mapping successful on the  right, no mapping on the left. Some mildly prominent lymph nodes bilaterally in the pelvis. Small, <5 mm, nodule in the cul de sac. No obvious intra-abdominal or pelvic evidence of disease.   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 SENTINEL LYMPH NODE, RIGHT EXTERNAL ILIAC, BIOPSY:  - Lymph node, negative for carcinoma (0/1)   B. SENTINEL LYMPH NODE, RIGHT OBTURATOR AND SURROUNDING LYMPH NODES,  BIOPSY:  - Lymph nodes, negative for carcinoma (0/5)    C. UTERUS, CERVIX, BILATERAL FALLOPIAN TUBES AND OVARIES:  - Invasive high-grade serous carcinoma  - Carcinoma focally invades into the serosal surface  - Lymphovascular invasion is present  - Benign unremarkable cervix  - Benign unremarkable bilateral fallopian tubes and ovaries  - See oncology table   D. CUL DE SAC NODULE, POSTERIOR, BIOPSY:  - Endometriosis   E. LYMPH NODE, LEFT PELVIC, BIOPSY:  - Lymph nodes, negative for carcinoma (0/3)   F. LYMPH NODE, LEFT PARAORTIC, BIOPSY:  - Lymph nodes, negative for carcinoma (0/5)   ONCOLOGY TABLE:   UTERUS, CARCINOMA OR CARCINOSARCOMA: Resection   Procedure: Total hysterectomy and bilateral salpingo-oophorectomy  Histologic Type: Serous carcinoma  Histologic Grade: High-grade  Myometrial Invasion:       Depth of Myometrial Invasion (mm): 32 mm       Myometrial Thickness (mm): 32 mm       Percentage of Myometrial Invasion: 100%  Uterine Serosa Involvement: Present, focal  Cervical stromal Involvement: Not identified  Extent of involvement of other tissue/organs: Not identified  Peritoneal/Ascitic Fluid: Negative for carcinoma  Lymphovascular Invasion: Present  Regional Lymph Nodes:       Pelvic Lymph Nodes Examined:                                   6 Sentinel                                   3 Non-sentinel                                   9 Total       Pelvic Lymph Nodes with Metastasis: 0                           Macrometastasis: (>2.0 mm): 0                           Micrometastasis: (>0.2 mm and < 2.0 mm): 0                           Isolated Tumor Cells (<0.2 mm): 0                           Laterality of Lymph Node with Tumor: Not  applicable  Extracapsular Extension: Not applicable       Para-aortic Lymph Nodes Examined:                                    0 Sentinel                                    5 Non-sentinel                                    5 Total       Para-aortic Lymph  Nodes with Metastasis: 0                           Macrometastasis: (>2.0 mm): 0                           Micrometastasis:  (>0.2 mm and < 2.0 mm): 0                           Isolated Tumor Cells (<0.2 mm): 0                           Laterality of Lymph Node with Tumor: Not  applicable                           Extracapsular Extension: Not applicable  Distant Metastasis:       Distant Site(s) Involved: Not applicable  Pathologic Stage Classification (pTNM, AJCC 8th Edition): pT3a, pN0  Ancillary Studies: MMR / MSI testing will be ordered  Representative Tumor Block: C4  Comment(s): Pancytokeratin was performed on the lymph nodes and is negative.   By immunohistochemistry, HER-2 is EQUIVOCAL (2+).  HER-2 by FISH is pending and will be reported in an addendum.   FLOURESCENCE IN-SITU HYBRIDIZATION RESULTS:   GROUP 1:  HER2 **POSITIVE**   On the tissue sample received from this individual HER2 FISH was performed by a technologist and cell imaging and analysis on the [employer].   RATIO of HER2/CEN 17 SIGNALS: 2.50  AVERAGE HER2 COPY NUMBER PER CELL: 4.74   The ratio of HER2/CEN 17 result exceeds the cutoff value of >=2.0 and a copy number of HER2 signals exceeding the cutoff range of >=4.0 signals per cell.  Arch Pathol Lab Med 1:1, 2018.    10/28/2021 Cancer Staging   Staging form: Corpus Uteri - Carcinoma and Carcinosarcoma, AJCC 8th Edition - Pathologic stage from 10/28/2021: Stage III (pT3, pN0, cM0) - Signed by Heath Lark, MD on 10/28/2021 Stage prefix: Initial diagnosis   11/07/2021 Procedure   Successful placement of a right internal jugular approach power injectable Port-A-Cath. The catheter is ready for immediate use.   11/11/2021 - 02/26/2022 Chemotherapy   Patient is on Treatment Plan : UTERINE Carboplatin AUC 6 / Paclitaxel q21d      Genetic Testing   Negative genetic testing. No pathogenic variants identified on the [facility] Multi-Cancer+RNA panel. VUS in MET called  c.3220A>G, VUS in MUTYh called c.1417G>A and VUS in POLE called c.596G>T identified. The report date is 01/20/2022.  The Multi-Cancer Panel + RNA offered  by [facility] includes sequencing and/or deletion duplication testing of the following 84 genes: AIP, ALK, APC, ATM, AXIN2,BAP1,  BARD1, BLM, BMPR1A, BRCA1, BRCA2, BRIP1, CASR, CDC73, CDH1, CDK4, CDKN1B, CDKN1C, CDKN2A (p14ARF), CDKN2A (p16INK4a), CEBPA, CHEK2, CTNNA1, DICER1, DIS3L2, EGFR (c.2369C>T, p.Thr790Met variant only), EPCAM (Deletion/duplication testing only), FH, FLCN, GATA2, GPC3, GREM1 (Promoter region deletion/duplication testing only), HOXB13 (c.251G>A, p.Gly84Glu), HRAS, KIT, MAX, MEN1, MET, MITF (c.952G>A, p.Glu318Lys variant only), MLH1, MSH2, MSH3, MSH6, MUTYH, NBN, NF1, NF2, NTHL1, PALB2, PDGFRA, PHOX2B, PMS2, POLD1, POLE, POT1, PRKAR1A, PTCH1, PTEN, RAD50, RAD51C, RAD51D, RB1, RECQL4, RET, RUNX1, SDHAF2, SDHA (sequence changes only), SDHB, SDHC, SDHD, SMAD4, SMARCA4, SMARCB1, SMARCE1, STK11, SUFU, TERC, TERT, TMEM127, TP53, TSC1, TSC2, VHL, WRN and WT1.   03/30/2022 Imaging   1. Bandlike opacity in the medial left lung apex is progressive in the interval, now with nodular component measuring up to 10 mm. While likely related to evolving atelectasis or scar, consider follow-up CT chest in 3 months to reassess. 2. Interval hysterectomy. 3. No evidence for metastatic disease in the chest, abdomen, or pelvis. 4. Trace free fluid in the upper pelvis, nonspecific. Of note, inferior pelvis is essentially obscured by beam hardening artifact from bilateral hip replacement. 5. Left colonic diverticulosis without diverticulitis. 6. Moderate stool volume. Imaging features could be compatible with constipation in the appropriate clinical setting. 7. Aortic Atherosclerosis (ICD10-I70.0).   06/26/2022 Imaging   1. Increasing conspicuity and size of a previously bandlike and now nodular area of density in the LEFT upper lobe suspicious for metastatic  process. PET scan and or biopsy could be helpful for further evaluation. 2. Stable small LEFT retropectoral lymph nodes. These warrant attention on follow-up imaging based on patient history. 3. Added density throughout the LEFT breast associated with surgical clips presumably postoperative and or post treatment related. 4. Aortic atherosclerosis.   Aortic Atherosclerosis (ICD10-I70.0).   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 Highly suspicious mass in the left breast at 1 o'clock measuring 1.9 cm.   2.  No mammographic evidence of malignancy in the right breast   11/27/2021 Procedure   Appropriate positioning of the venous shaped biopsy marking clip at the site of biopsy in the 1 o'clock location of the LEFT breast.   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 No pathogenic variants identified on the [facility] Multi-Cancer+RNA panel. VUS in MET called c.3220A>G, VUS in MUTYh called c.1417G>A and VUS in POLE called c.596G>T identified. The report date is 01/20/2022.  The Multi-Cancer Panel + RNA offered by [facility] includes sequencing and/or deletion duplication testing of the following 84 genes: AIP, ALK, APC, ATM, AXIN2,BAP1,  BARD1, BLM, BMPR1A, BRCA1, BRCA2, BRIP1, CASR, CDC73, CDH1, CDK4, CDKN1B, CDKN1C, CDKN2A (p14ARF), CDKN2A (p16INK4a), CEBPA, CHEK2, CTNNA1, DICER1, DIS3L2, EGFR (c.2369C>T, p.Thr790Met variant only), EPCAM (Deletion/duplication testing only), FH, FLCN, GATA2, GPC3, GREM1 (Promoter region deletion/duplication testing only), HOXB13 (c.251G>A, p.Gly84Glu), HRAS, KIT, MAX, MEN1, MET, MITF (c.952G>A, p.Glu318Lys variant only),  MLH1, MSH2, MSH3, MSH6, MUTYH, NBN, NF1, NF2, NTHL1, PALB2, PDGFRA, PHOX2B, PMS2, POLD1, POLE, POT1, PRKAR1A, PTCH1, PTEN, RAD50, RAD51C, RAD51D, RB1, RECQL4, RET, RUNX1, SDHAF2, SDHA (sequence changes only), SDHB, SDHC, SDHD, SMAD4, SMARCA4, SMARCB1,  SMARCE1, STK11, SUFU, TERC, TERT, TMEM127, TP53, TSC1, TSC2, VHL, WRN and WT1.   03/30/2022 -  Anti-estrogen oral therapy   She is started on Arimidex   06/16/2022 Pathology Results   FINAL MICROSCOPIC DIAGNOSIS:   A. BREAST, LEFT, LUMPECTOMY:  -  Invasive mammary carcinoma (NST)/invasive ductal carcinoma (NOS) [retained membranous e-cadherin positivity], 1.7 cm in greatest dimension, Nottingham histologic score 2 out of 3.  -  Ductal carcinoma in situ (DCIS), cribriform type, nuclear grade 2 of 3.  -  Biomarkers, performed on the biopsy specimen, ER positive (90% strong), PR positive (11 to 50% moderate), HER2/neu negative  -  Margins negative in conjunction with parts a B and C (closest margin anterior at 5 mm and superior at 9 mm all others greater than 10 mm).  pT1c pNX pM n/a   B. BREAST, LEFT POSTERIOR MARGIN, EXCISION:  -  Benign breast tissue, negative for malignancy (new margin 1.5 cm)   C. BREAST, LEFT MEDIAL MARGIN, EXCISION:  -  Benign breast tissue, negative for malignancy (new margin 1.5 cm)   ONCOLOGY TABLE:   INVASIVE CARCINOMA OF THE BREAST:  Resection   Procedure: Lumpectomy  Specimen Laterality: Left  Histologic Type: Invasive mammary carcinoma of no special type  (NST)/invasive ductal carcinoma (NOS)  Histologic Grade:       Glandular (Acinar)/Tubular Differentiation: 3       Nuclear Pleomorphism: 2       Mitotic Rate: 1       Overall Grade: 2  Tumor Size: 1.7 x 1.5 x 1.0 cm  Ductal Carcinoma In Situ: Cribriform type, nuclear grade 2 of 3  Tumor Extent: Limited to breast parenchyma  Treatment Effect in the Breast: It is noted the patient has received  chemotherapy for endometrial carcinoma as well as antrum  aromatase  inhibitor without morphologic evidence of tumor response  Margins: All margins negative for invasive carcinoma       Distance from Closest Margin (mm): 5 mm and 9 mm       Specify Closest Margin (required only if <38mm): Anterior (5) and  Superior (9)  DCIS Margins: Uninvolved by DCIS       Distance from Closest Margin (mm): 5 mm       Specify Closest Margin (required only if <22mm): Posterior  Regional Lymph Nodes: Not applicable (no lymph nodes submitted or found)       Number of Lymph Nodes Examined: 0       Number of Sentinel Nodes Examined: 0       Number of Lymph Nodes with Macrometastases (>2 mm): N/A       Number of Lymph Nodes with Micrometastases: N/A       Number of Lymph Nodes with Isolated Tumor Cells (=0.2 mm or =200  cells): N/A       Size of Largest Metastatic Deposit (mm): N/A       Extranodal Extension: N/A  Distant Metastasis:       Distant Site(s) Involved: N/A  Breast Biomarker Testing Performed on Previous Biopsy:       Testing Performed on Case Number: [id number]             Estrogen Receptor: Positive, >90% strong             Progesterone Receptor: Positive, 11 to 50% moderate             HER2: IHC 0/3; negative             Ki-67:  Not performed  Pathologic Stage Classification (pTNM, AJCC 8th Edition): pT1c, pNx  Representative Tumor Block: A5  Comment(s): [None]    06/16/2022 Surgery   Preoperative diagnosis: Left breast cancer, clinical stage I  Procedure: Left breast radioactive seed guided lumpectomy Surgeon: Dr. Serita Grammes   07/09/2022 PET scan   1. Hypermetabolic apical left upper lobe nodule, worrisome for primary bronchogenic carcinoma. Metastatic disease is also possibility. 2. Mild residual hypermetabolism associated with the left breast, likely postoperative/post treatment in etiology. Difficult to exclude residual disease. 3. Punctate left renal stone.  4. Aortic atherosclerosis (ICD10-I70.0). coronary artery calcification.      PHYSICAL EXAMINATION: ECOG PERFORMANCE STATUS: 1 - Symptomatic but completely ambulatory  Vitals:   07/13/22 1430  BP: (!) 141/71  Pulse: 68  Resp: 18  Temp: 97.6 F (36.4 C)  SpO2: 100%   Filed Weights   07/13/22 1430  Weight: 187 lb 3.2 oz (84.9 kg)    GENERAL:alert, no distress and comfortable NEURO: alert & oriented x 3 with fluent speech, no focal motor/sensory deficits  LABORATORY DATA:  I have reviewed the data as listed    Component Value Date/Time   NA 138 06/29/2022 1122   K 4.4 06/29/2022 1122   CL 105 06/29/2022 1122   CO2 28 06/29/2022 1122   GLUCOSE 99 06/29/2022 1122   BUN 16 06/29/2022 1122   CREATININE 0.93 06/29/2022 1122   CALCIUM 9.2 06/29/2022 1122   PROT 7.1 06/29/2022 1122   ALBUMIN 4.1 06/29/2022 1122   AST 21 06/29/2022 1122   ALT 16 06/29/2022 1122   ALKPHOS 75 06/29/2022 1122   BILITOT 0.5 06/29/2022 1122   GFRNONAA >60 06/29/2022 1122    No results found for: "SPEP", "UPEP"  Lab Results  Component Value Date   WBC 4.4 06/29/2022   NEUTROABS 2.4 06/29/2022   HGB 12.8 06/29/2022   HCT 37.8 06/29/2022   MCV 91.7 06/29/2022   PLT 181 06/29/2022      Chemistry      Component Value Date/Time   NA 138 06/29/2022 1122   K 4.4 06/29/2022 1122   CL 105 06/29/2022 1122   CO2 28 06/29/2022 1122   BUN 16 06/29/2022 1122   CREATININE 0.93 06/29/2022 1122      Component Value Date/Time   CALCIUM 9.2 06/29/2022 1122   ALKPHOS 75 06/29/2022 1122   AST 21 06/29/2022 1122   ALT 16 06/29/2022 1122   BILITOT 0.5 06/29/2022 1122       RADIOGRAPHIC STUDIES: I have personally reviewed the radiological images as listed and agreed with the findings in the report. DG Chest Port 1 View  Result Date: 07/10/2022 CLINICAL DATA:  CHF, status post bronchoscopy EXAM: PORTABLE CHEST 1 VIEW COMPARISON:  None Available. FINDINGS: Unchanged cardiomediastinal silhouette. Left medial apical nodular opacity with new biopsy marker. No pleural  effusion. No pneumothorax. No acute osseous abnormality. Cervical spine fusion hardware noted. IMPRESSION: Post biopsy changes in the left medial apex. No evidence of pneumothorax. Electronically Signed   By: Maurine Simmering M.D.   On: 07/10/2022 10:08   DG C-ARM BRONCHOSCOPY  Result Date: 07/10/2022 C-ARM BRONCHOSCOPY: Fluoroscopy was utilized by the requesting physician.  No radiographic interpretation.   DG C-Arm 1-60 Min-No Report  Result Date: 07/10/2022 Fluoroscopy was utilized by the requesting physician.  No radiographic interpretation.   NM PET Image Initial (PI) Skull Base To Thigh  Result Date: 07/09/2022 CLINICAL DATA:  Initial treatment strategy for invasive breast cancer, endometrial cancer. EXAM: NUCLEAR  MEDICINE PET SKULL BASE TO THIGH TECHNIQUE: 8.9 mCi F-18 FDG was injected intravenously. Full-ring PET imaging was performed from the skull base to thigh after the radiotracer. CT data was obtained and used for attenuation correction and anatomic localization. Fasting blood glucose: 96 mg/dl COMPARISON:  CT chest 06/26/2022, CT chest abdomen pelvis 03/27/2022. FINDINGS: Mediastinal blood pool activity: SUV max 2.9 Liver activity: SUV max NA NECK: No abnormal hypermetabolism. Incidental CT findings: None. CHEST: No hypermetabolic mediastinal hilar or. 11 mm nodule in the apical left upper lobe SUV max of 7.0. No additional hypermetabolic nodules. Very mild hypermetabolism in the distal esophagus, physiologic. Hypermetabolism associated with postoperative/post treatment changes in the left breast, SUV max 3.5. Incidental CT findings: Atherosclerotic calcification of the aorta, aortic valve and coronary arteries. Heart is enlarged. No pericardial or pleural effusion. ABDOMEN/PELVIS: No abnormal hypermetabolism in the liver, adrenal glands, spleen or pancreas. No hypermetabolic lymph nodes Incidental CT findings: Liver, gallbladder, adrenal glands and right kidney are unremarkable. There may be a  punctate stone in the lower pole left kidney. Spleen, pancreas, stomach and bowel are grossly unremarkable. Atherosclerotic calcification of the aorta. SKELETON: No abnormal hypermetabolism. Incidental CT findings: Bilateral hip arthroplasties.  Degenerative changes in the spine. IMPRESSION: 1. Hypermetabolic apical left upper lobe nodule, worrisome for primary bronchogenic carcinoma. Metastatic disease is also possibility. 2. Mild residual hypermetabolism associated with the left breast, likely postoperative/post treatment in etiology. Difficult to exclude residual disease. 3. Punctate left renal stone. 4. Aortic atherosclerosis (ICD10-I70.0). coronary artery calcification. Electronically Signed   By: Lorin Picket M.D.   On: 07/09/2022 11:31   CT Chest Wo Contrast  Result Date: 06/26/2022 CLINICAL DATA:  History of lung nodules and breast cancer and uterine cancer. * Tracking Code: BO * EXAM: CT CHEST WITHOUT CONTRAST TECHNIQUE: Multidetector CT imaging of the chest was performed following the standard protocol without IV contrast. RADIATION DOSE REDUCTION: This exam was performed according to the departmental dose-optimization program which includes automated exposure control, adjustment of the mA and/or kV according to patient size and/or use of iterative reconstruction technique. COMPARISON:  March 27, 2022 FINDINGS: Cardiovascular: Calcified aortic atherosclerotic changes. No aneurysmal dilation of the thoracic aorta. Normal heart size without pericardial effusion of substantial volume or evidence of nodularity. Central pulmonary vasculature is normal to the extent evaluated on noncontrast imaging. Mediastinum/Nodes: No internal mammary adenopathy. No thoracic inlet lymphadenopathy. No axillary adenopathy by size criteria. There is small LEFT retropectoral lymph nodes largest measuring 8 mm (image 24/2) this is little changed compared to previous imaging. No supraclavicular adenopathy on the LEFT. No  mediastinal or hilar adenopathy.  Esophagus grossly normal. Lungs/Pleura: LEFT upper lobe nodule (image 20/3) 11 x 11 mm previously 10 x 8 mm, overall increasing in size slightly in conspicuity since previous imaging. On coronal and sagittal imaging this appears more discretely nodular than purely bandlike. No consolidation.  No pleural effusion.  Airways are patent. Upper Abdomen: Incidental imaging of upper abdominal contents without acute findings. Imaged portions the liver, gallbladder, pancreas, spleen, adrenal glands and kidneys are unremarkable. Musculoskeletal: Surgical clips in the LEFT breast. Added density throughout the LEFT breast and Verus areas (image 56/2) area measuring 3.0 x 1.9 cm for example is contiguous with another area which measures 2.9 x 2.6 cm and a third area more cephalad and medial measuring 3.0 x 2.7 cm. Spinal degenerative changes. No destructive bony findings or acute bone process. IMPRESSION: 1. Increasing conspicuity and size of a previously bandlike and now nodular area  of density in the LEFT upper lobe suspicious for metastatic process. PET scan and or biopsy could be helpful for further evaluation. 2. Stable small LEFT retropectoral lymph nodes. These warrant attention on follow-up imaging based on patient history. 3. Added density throughout the LEFT breast associated with surgical clips presumably postoperative and or post treatment related. 4. Aortic atherosclerosis. Aortic Atherosclerosis (ICD10-I70.0). Electronically Signed   By: Zetta Bills M.D.   On: 06/26/2022 16:13   MM Breast Surgical Specimen  Result Date: 06/16/2022 CLINICAL DATA:  Evaluate surgical specimen following lumpectomy for LEFT breast cancer. EXAM: SPECIMEN RADIOGRAPH OF THE LEFT BREAST COMPARISON:  Previous exam(s). FINDINGS: Status post excision of the LEFT breast. The radioactive seed and VENUS biopsy marker clip are present and completely intact. IMPRESSION: Specimen radiograph of the LEFT  breast. Electronically Signed   By: Margarette Canada M.D.   On: 06/16/2022 09:07  MM LT RADIOACTIVE SEED LOC MAMMO GUIDE  Result Date: 06/15/2022 CLINICAL DATA:  81 year old female for seed localization of LEFT breast cancer prior to lumpectomy. EXAM: MAMMOGRAPHIC GUIDED RADIOACTIVE SEED LOCALIZATION OF THE LEFT BREAST COMPARISON:  Previous exam(s). FINDINGS: Patient presents for radioactive seed localization prior to LEFT lumpectomy. I met with the patient and we discussed the procedure of seed localization including benefits and alternatives. We discussed the high likelihood of a successful procedure. We discussed the risks of the procedure including infection, bleeding, tissue injury and further surgery. We discussed the low dose of radioactivity involved in the procedure. Informed, written consent was given. The usual time-out protocol was performed immediately prior to the procedure. Using mammographic guidance, sterile technique, 1% lidocaine and an I-125 radioactive seed, the venus clip was localized using a LATERAL approach. The follow-up mammogram images confirm the seed in the expected location and were marked for Dr. Donne Hazel. Follow-up survey of the patient confirms presence of the radioactive seed. Order number of I-125 seed:  [id number]. Total activity:  9.150 millicuries.  Reference Date: 05/14/2022. The patient tolerated the procedure well and [facility] She was given instructions regarding seed removal. IMPRESSION: Radioactive seed localization LEFT breast. No apparent complications. Electronically Signed   By: Margarette Canada M.D.   On: 06/15/2022 15:07

## 2022-07-13 NOTE — Assessment & Plan Note (Signed)
I have reviewed recent procedure note Pathology is pending She tolerated recent procedure without difficulties We discussed potential outcome and different potential pathology It is possible she either had metastatic breast cancer, metastatic uterine cancer or new primary lung cancer I think with all 3 types of disease, she would benefit from radiation oncology consultation We discussed the risk and benefits of referring her to see cardiothoracic surgery However, if the pathology review a completely different new malignancy, further work-up might be needed If she has metastatic breast cancer, she can benefit from addition of treatment If she has metastatic uterine cancer, she could benefit from immunotherapy It is too difficult to discuss right now in the absence of final pathology report She is interested to get radiation treatment closer to home I will call her on Friday when I return back to work to determine the next step

## 2022-07-13 NOTE — Assessment & Plan Note (Signed)
She is tolerating Arimidex well She is taking gabapentin for hot flashes but it is too difficult to tolerate at twice daily dose due to excessive sedation I recommend the patient to reduce gabapentin to once daily at nighttime

## 2022-07-14 ENCOUNTER — Telehealth: Payer: Self-pay | Admitting: Student

## 2022-07-14 DIAGNOSIS — R911 Solitary pulmonary nodule: Secondary | ICD-10-CM

## 2022-07-14 NOTE — Telephone Encounter (Signed)
Called and discussed biopsy results revealing lymphocytic inflammation only. There's a small chance this could be inflammatory lesion however indolent pattern of growth remains concerning for malignancy. Order placed for IR CT-guided biopsy of the nodule.   [place]

## 2022-07-15 NOTE — Telephone Encounter (Signed)
Verified with Tonya C in scheduling that order has been put in review.

## 2022-07-16 ENCOUNTER — Other Ambulatory Visit: Payer: Self-pay | Admitting: Radiology

## 2022-07-16 ENCOUNTER — Telehealth: Payer: Self-pay | Admitting: *Deleted

## 2022-07-16 ENCOUNTER — Encounter: Payer: Self-pay | Admitting: Student

## 2022-07-16 DIAGNOSIS — R918 Other nonspecific abnormal finding of lung field: Secondary | ICD-10-CM

## 2022-07-16 NOTE — Progress Notes (Unsigned)
Mir, Paula Libra, MD  Donita Brooks D Approved for CT guided left apical lung nodule.  Bronchoscopy biopsy is inconclusive.   Mir

## 2022-07-16 NOTE — Telephone Encounter (Signed)
CALLED PATIENT, PER PATIENT REQUEST, LVM FOR A RETURN CALL

## 2022-07-16 NOTE — Telephone Encounter (Signed)
RETURNED PATIENT'S PHONE CALL, SPOKE WITH PATIENT. ?

## 2022-07-17 ENCOUNTER — Telehealth: Payer: Self-pay

## 2022-07-17 ENCOUNTER — Other Ambulatory Visit (HOSPITAL_COMMUNITY): Payer: Self-pay | Admitting: Radiology

## 2022-07-17 NOTE — Telephone Encounter (Signed)
-----   Message from Heath Lark, MD sent at 07/17/2022  8:16 AM EDT ----- Please call her and let her know I will be checking on biopsy result end of next week and will call her as soon as bx result is available

## 2022-07-17 NOTE — Telephone Encounter (Signed)
Called and given below message. She verbalized understanding and appreciated the call . °

## 2022-07-20 ENCOUNTER — Encounter (HOSPITAL_COMMUNITY): Payer: Self-pay

## 2022-07-20 ENCOUNTER — Other Ambulatory Visit: Payer: Self-pay

## 2022-07-20 ENCOUNTER — Encounter: Payer: Self-pay | Admitting: *Deleted

## 2022-07-20 ENCOUNTER — Ambulatory Visit (HOSPITAL_COMMUNITY)
Admission: RE | Admit: 2022-07-20 | Discharge: 2022-07-20 | Disposition: A | Discharge status: Home / Self Care | Payer: Medicare Other | Source: Ambulatory Visit | Attending: Student | Admitting: Student

## 2022-07-20 ENCOUNTER — Ambulatory Visit: Payer: TRICARE For Life (TFL) | Admitting: Radiation Oncology

## 2022-07-20 DIAGNOSIS — Z87891 Personal history of nicotine dependence: Secondary | ICD-10-CM | POA: Diagnosis not present

## 2022-07-20 DIAGNOSIS — Z853 Personal history of malignant neoplasm of breast: Secondary | ICD-10-CM | POA: Diagnosis not present

## 2022-07-20 DIAGNOSIS — R918 Other nonspecific abnormal finding of lung field: Secondary | ICD-10-CM

## 2022-07-20 DIAGNOSIS — R911 Solitary pulmonary nodule: Secondary | ICD-10-CM | POA: Diagnosis present

## 2022-07-20 LAB — CBC
HCT: 39.8 % (ref 36.0–46.0)
Hemoglobin: 13.2 g/dL (ref 12.0–15.0)
MCH: 30.9 pg (ref 26.0–34.0)
MCHC: 33.2 g/dL (ref 30.0–36.0)
MCV: 93.2 fL (ref 80.0–100.0)
Platelets: 170 10*3/uL (ref 150–400)
RBC: 4.27 MIL/uL (ref 3.87–5.11)
RDW: 12.6 % (ref 11.5–15.5)
WBC: 4.6 10*3/uL (ref 4.0–10.5)
nRBC: 0 % (ref 0.0–0.2)

## 2022-07-20 LAB — PROTIME-INR
INR: 0.9 (ref 0.8–1.2)
Prothrombin Time: 12.3 seconds (ref 11.4–15.2)

## 2022-07-20 MED ORDER — MIDAZOLAM HCL 2 MG/2ML IJ SOLN
INTRAMUSCULAR | Status: AC | PRN
  Administered 2022-07-20: 1 mg via INTRAVENOUS

## 2022-07-20 MED ORDER — FENTANYL CITRATE (PF) 100 MCG/2ML IJ SOLN
INTRAMUSCULAR | Status: AC | PRN
  Administered 2022-07-20: 50 ug via INTRAVENOUS

## 2022-07-20 MED ORDER — LIDOCAINE HCL 1 % IJ SOLN
10.0000 mL | Freq: Once | INTRAMUSCULAR | Status: AC
  Administered 2022-07-20: 10 mL via INTRADERMAL

## 2022-07-20 MED ORDER — SODIUM CHLORIDE 0.9 % IV SOLN: INTRAVENOUS | Status: DC

## 2022-07-20 MED ORDER — MIDAZOLAM HCL 2 MG/2ML IJ SOLN
INTRAMUSCULAR | Status: AC
  Filled 2022-07-20: qty 4

## 2022-07-20 MED ORDER — FENTANYL CITRATE (PF) 100 MCG/2ML IJ SOLN
INTRAMUSCULAR | Status: AC
  Filled 2022-07-20: qty 4

## 2022-07-20 NOTE — Procedures (Signed)
Interventional Radiology Procedure Note  Procedure: CT LUL NODULE CORE BX     Complications: None  Estimated Blood Loss:  MIN  Findings: 18 G CORE OF THE LUL NODULE    Tamera Punt, MD

## 2022-07-20 NOTE — Sedation Documentation (Signed)
Dr Annamaria Boots ordered patient to be on 2L [employer] until chest x-ray results.

## 2022-07-20 NOTE — H&P (Signed)
Chief Complaint: Patient was seen in consultation today for left pulmonary nodule  at the request of [place] M  Referring Physician(s): [place] M  Supervising Physician: Daryll Brod  Patient Status: [facility] - Out-pt  History of Present Illness: Kristin Carlson is a 81 y.o. female with past medical history significant for left breast cancer.  Patient followed by Dr. Alvy Bimler s/p lumpectomy and currently on maintenance therapy. Patient found to have enlargement of known left upper lobe nodule on recent scan.  Patient underwent robotic assisted bronchoscopy 07/10/2022 to biopsy left upper lobe nodule.  Biopsy resulted lymphatic inflammation only.  Due to concern for malignancy, Dr. Verlee Monte has referred patient to IR for CT-guided biopsy of lung nodule.  Dr. Dwaine Gale, IR, approved CT-guided left apical lung nodule.  PET 07/09/22:  IMPRESSION: 1. Hypermetabolic apical left upper lobe nodule, worrisome for primary bronchogenic carcinoma. Metastatic disease is also possibility. 2. Mild residual hypermetabolism associated with the left breast, likely postoperative/post treatment in etiology. Difficult to exclude residual disease. 3. Punctate left renal stone. 4. Aortic atherosclerosis (ICD10-I70.0). coronary artery calcification.    Past Medical History:  Diagnosis Date   Actinic keratosis 06/04/2021   right anteromedial thigh   Anemia    Arthritis    Basal cell carcinoma 06/04/2021   right suprapubic, EDC 07/29/2021   Basal cell carcinoma 07/29/2021   left nasal ala. atypical basaloid cells   Family history of lung cancer    GERD (gastroesophageal reflux disease)    History of basal cell carcinoma 07/29/2021   left thigh, EDC at time of bx   History of radiation therapy    vaginal brachytherapy [place] 12/18/2021-01/13/2022 Dr Gery Pray   HLD (hyperlipidemia)    Hypertension    Hypothyroidism    Pneumonia    HX OF YEARS AGO   PONV (postoperative nausea and vomiting)      Past Surgical History:  Procedure Laterality Date   BREAST BIOPSY Left 11/27/2021   u/s bs, venus clip,  INVASIVE MAMMARY CARCINOMA   BREAST CYST ASPIRATION     BREAST LUMPECTOMY WITH RADIOACTIVE SEED LOCALIZATION Left 06/16/2022   Procedure: LEFT BREAST LUMPECTOMY WITH RADIOACTIVE SEED LOCALIZATION;  Surgeon: Rolm Bookbinder, MD;  Location: [place];  Service: General;  Laterality: Left;   BRONCHIAL BIOPSY  07/10/2022   Procedure: BRONCHIAL BIOPSIES;  Surgeon: Maryjane Hurter, MD;  Location: [facility] ENDOSCOPY;  Service: Pulmonary;;   BRONCHIAL BRUSHINGS  07/10/2022   Procedure: BRONCHIAL BRUSHINGS;  Surgeon: Maryjane Hurter, MD;  Location: [facility] ENDOSCOPY;  Service: Pulmonary;;   BRONCHIAL NEEDLE ASPIRATION BIOPSY  07/10/2022   Procedure: BRONCHIAL NEEDLE ASPIRATION BIOPSIES;  Surgeon: Maryjane Hurter, MD;  Location: [facility] ENDOSCOPY;  Service: Pulmonary;;   BRONCHIAL WASHINGS  07/10/2022   Procedure: BRONCHIAL WASHINGS;  Surgeon: Maryjane Hurter, MD;  Location: [facility] ENDOSCOPY;  Service: Pulmonary;;   cataract Bilateral    FIDUCIAL MARKER PLACEMENT  07/10/2022   Procedure: FIDUCIAL MARKER PLACEMENT;  Surgeon: Maryjane Hurter, MD;  Location: [place] ENDOSCOPY;  Service: Pulmonary;;   HIP SURGERY     IR IMAGING GUIDED PORT INSERTION  11/07/2021   IR REMOVAL TUN ACCESS W/ PORT W/O FL MOD SED  01/06/2022   KNEE ARTHROSCOPY Left 2003   NECK SURGERY  1997   Fusion C3, C4, C5   ROBOTIC ASSISTED TOTAL HYSTERECTOMY WITH BILATERAL SALPINGO OOPHERECTOMY Bilateral 10/14/2021   Procedure: XI ROBOTIC ASSISTED TOTAL HYSTERECTOMY WITH BILATERAL SALPINGO OOPHORECTOMY, LYMPH NODE DISSECTION,  LAPAROTOMY;  Surgeon: Lafonda Mosses,  MD;  Location: [facility] ORS;  Service: Gynecology;  Laterality: Bilateral;   SENTINEL NODE BIOPSY N/A 10/14/2021   Procedure: SENTINEL NODE DISSECTION;  Surgeon: Lafonda Mosses, MD;  Location: [facility] ORS;  Service: Gynecology;  Laterality: N/A;   THYROIDECTOMY   2007   TOTAL HIP ARTHROPLASTY Bilateral    Left Hip -2015, Right Hip 208   VIDEO BRONCHOSCOPY WITH RADIAL ENDOBRONCHIAL ULTRASOUND  07/10/2022   Procedure: RADIAL ENDOBRONCHIAL ULTRASOUND;  Surgeon: Maryjane Hurter, MD;  Location: [facility] ENDOSCOPY;  Service: Pulmonary;;    Allergies: Lactose, Codeine, Penicillins, Sulfa antibiotics, and Vancomycin  Medications: Prior to Admission medications   Medication Sig Start Date End Date Taking? Authorizing Provider  acetaminophen (TYLENOL) 500 MG tablet Take 1,000 mg by mouth every 6 (six) hours as needed (pain.).    [provider]  anastrozole (ARIMIDEX) 1 MG tablet Take 1 tablet (1 mg total) by mouth daily. Patient taking differently: Take 1 mg by mouth every evening. 03/30/22   Heath Lark, MD  Cholecalciferol (VITAMIN D3) 50 MCG (2000 UT) TABS Take 2,000 Units by mouth in the morning.    [provider]  furosemide (LASIX) 20 MG tablet Take 10 mg by mouth in the morning. Hold for activities/errands    [provider]  gabapentin (NEURONTIN) 300 MG capsule Take 1 capsule (300 mg total) by mouth 2 (two) times daily. 06/29/22   Heath Lark, MD  ibuprofen (ADVIL) 200 MG tablet Take 400 mg by mouth every 8 (eight) hours as needed (severe pain.).    [provider]  levothyroxine (SYNTHROID) 125 MCG tablet Take 125 mcg by mouth daily before breakfast. 03/12/22   [provider]  losartan (COZAAR) 50 MG tablet Take 50 mg by mouth in the morning. 01/09/22 01/09/23  [provider]  magnesium oxide (MAG-OX) 400 MG tablet Take 400 mg by mouth at bedtime.    [provider]  Multiple Vitamin (MULTIVITAMIN WITH MINERALS) TABS tablet Take 1 tablet by mouth in the morning.    [provider]  Omega-3 1000 MG CAPS Take 1,000-2,000 mg by mouth See admin instructions. Take 2 capsules by mouth in the morning & take 1 capsule by mouth at night.    [provider]  ondansetron (ZOFRAN) 8 MG  tablet Take 1 tablet (8 mg total) by mouth every 8 (eight) hours as needed. Patient taking differently: Take 8 mg by mouth every 8 (eight) hours as needed for nausea or vomiting. 10/28/21   Heath Lark, MD  pantoprazole (PROTONIX) 20 MG tablet Take 20 mg by mouth daily as needed for heartburn or indigestion. 03/06/21   [provider]  prochlorperazine (COMPAZINE) 10 MG tablet Take 10 mg by mouth every 6 (six) hours as needed for nausea or vomiting. 10/28/21   [provider]  propranolol (INDERAL) 40 MG tablet Take 40 mg by mouth 2 (two) times daily.    [provider]  rosuvastatin (CRESTOR) 5 MG tablet Take 5 mg by mouth at bedtime. 03/06/21 07/10/22  [provider]  senna-docusate (SENOKOT-S) 8.6-50 MG tablet Take 2 tablets by mouth 2 (two) times daily. Patient taking differently: Take 2 tablets by mouth 2 (two) times daily as needed (constipation.). 02/05/22   Heath Lark, MD  Simethicone 125 MG TABS Take 250 mg by mouth 2 (two) times daily as needed (gas).    [provider]  temazepam (RESTORIL) 15 MG capsule Take 15 mg by mouth at bedtime as needed for sleep. 02/03/22  [provider]     Family History  Problem Relation Age of Onset   Cancer Father        Lung Cancer   Lung cancer Father        smoked   Skin cancer Maternal Grandmother    Skin cancer Paternal Grandmother    Cancer Paternal Aunt        mouth   Colon cancer Neg Hx    Breast cancer Neg Hx    Ovarian cancer Neg Hx    Endometrial cancer Neg Hx    Pancreatic cancer Neg Hx    Prostate cancer Neg Hx     Social History   Socioeconomic History   Marital status: Married    Spouse name: Herbie [place]   Number of children: 2   Years of education: Not on file   Highest education level: Not on file  Occupational History   Not on file  Tobacco Use   Smoking status: Former    Packs/day: 0.50    Years: 3.00    Total pack years: 1.50    Types: Cigarettes    Quit date:  11/30/1966    Years since quitting: 55.6   Smokeless tobacco: Never  Vaping Use   Vaping Use: Never used  Substance and Sexual Activity   Alcohol use: Yes    Comment: RARE   Drug use: Never   Sexual activity: Not Currently  Other Topics Concern   Not on file  Social History Narrative   Retired Therapist, sports   Social Determinants of Radio broadcast assistant Strain: Not on file  Food Insecurity: Not on file  Transportation Needs: Not on file  Physical Activity: Not on file  Stress: Not on file  Social Connections: Not on file    Review of Systems: A 12 point ROS discussed and pertinent positives are indicated in the HPI above.  All other systems are negative.  Review of Systems  All other systems reviewed and are negative.   Vital Signs: BP (!) 166/72   Pulse (!) 56   Temp (!) 97.4 F (36.3 C) (Temporal)   Resp 17   Ht '5\' 3"'$  (1.6 m)   Wt 182 lb (82.6 kg)   SpO2 97%   BMI 32.24 kg/m    Physical Exam Vitals reviewed.  Constitutional:      General: She is not in acute distress.    Appearance: Normal appearance. She is not ill-appearing.  HENT:     Head: Normocephalic and atraumatic.     Mouth/Throat:     Mouth: Mucous membranes are dry.     Pharynx: Oropharynx is clear.  Eyes:     Extraocular Movements: Extraocular movements intact.     Pupils: Pupils are equal, round, and reactive to light.  Cardiovascular:     Pulses: Normal pulses.     Heart sounds: Normal heart sounds.  Pulmonary:     Effort: Pulmonary effort is normal. No respiratory distress.     Breath sounds: Normal breath sounds.  Skin:    General: Skin is warm and dry.  Neurological:     Mental Status: She is alert and oriented to person, place, and time.  Psychiatric:        Mood and Affect: Mood normal.        Behavior: Behavior normal.        Thought Content: Thought content normal.        Judgment: Judgment normal.     Imaging: DG Chest  Port 1 View  Result Date: 07/10/2022 CLINICAL DATA:   CHF, status post bronchoscopy EXAM: PORTABLE CHEST 1 VIEW COMPARISON:  None Available. FINDINGS: Unchanged cardiomediastinal silhouette. Left medial apical nodular opacity with new biopsy marker. No pleural effusion. No pneumothorax. No acute osseous abnormality. Cervical spine fusion hardware noted. IMPRESSION: Post biopsy changes in the left medial apex. No evidence of pneumothorax. Electronically Signed   By: Maurine Simmering M.D.   On: 07/10/2022 10:08   DG C-ARM BRONCHOSCOPY  Result Date: 07/10/2022 C-ARM BRONCHOSCOPY: Fluoroscopy was utilized by the requesting physician.  No radiographic interpretation.   DG C-Arm 1-60 Min-No Report  Result Date: 07/10/2022 Fluoroscopy was utilized by the requesting physician.  No radiographic interpretation.   NM PET Image Initial (PI) Skull Base To Thigh  Result Date: 07/09/2022 CLINICAL DATA:  Initial treatment strategy for invasive breast cancer, endometrial cancer. EXAM: NUCLEAR MEDICINE PET SKULL BASE TO THIGH TECHNIQUE: 8.9 mCi F-18 FDG was injected intravenously. Full-ring PET imaging was performed from the skull base to thigh after the radiotracer. CT data was obtained and used for attenuation correction and anatomic localization. Fasting blood glucose: 96 mg/dl COMPARISON:  CT chest 06/26/2022, CT chest abdomen pelvis 03/27/2022. FINDINGS: Mediastinal blood pool activity: SUV max 2.9 Liver activity: SUV max NA NECK: No abnormal hypermetabolism. Incidental CT findings: None. CHEST: No hypermetabolic mediastinal hilar or. 11 mm nodule in the apical left upper lobe SUV max of 7.0. No additional hypermetabolic nodules. Very mild hypermetabolism in the distal esophagus, physiologic. Hypermetabolism associated with postoperative/post treatment changes in the left breast, SUV max 3.5. Incidental CT findings: Atherosclerotic calcification of the aorta, aortic valve and coronary arteries. Heart is enlarged. No pericardial or pleural effusion. ABDOMEN/PELVIS: No  abnormal hypermetabolism in the liver, adrenal glands, spleen or pancreas. No hypermetabolic lymph nodes Incidental CT findings: Liver, gallbladder, adrenal glands and right kidney are unremarkable. There may be a punctate stone in the lower pole left kidney. Spleen, pancreas, stomach and bowel are grossly unremarkable. Atherosclerotic calcification of the aorta. SKELETON: No abnormal hypermetabolism. Incidental CT findings: Bilateral hip arthroplasties.  Degenerative changes in the spine. IMPRESSION: 1. Hypermetabolic apical left upper lobe nodule, worrisome for primary bronchogenic carcinoma. Metastatic disease is also possibility. 2. Mild residual hypermetabolism associated with the left breast, likely postoperative/post treatment in etiology. Difficult to exclude residual disease. 3. Punctate left renal stone. 4. Aortic atherosclerosis (ICD10-I70.0). coronary artery calcification. Electronically Signed   By: Lorin Picket M.D.   On: 07/09/2022 11:31   CT Chest Wo Contrast  Result Date: 06/26/2022 CLINICAL DATA:  History of lung nodules and breast cancer and uterine cancer. * Tracking Code: BO * EXAM: CT CHEST WITHOUT CONTRAST TECHNIQUE: Multidetector CT imaging of the chest was performed following the standard protocol without IV contrast. RADIATION DOSE REDUCTION: This exam was performed according to the departmental dose-optimization program which includes automated exposure control, adjustment of the mA and/or kV according to patient size and/or use of iterative reconstruction technique. COMPARISON:  March 27, 2022 FINDINGS: Cardiovascular: Calcified aortic atherosclerotic changes. No aneurysmal dilation of the thoracic aorta. Normal heart size without pericardial effusion of substantial volume or evidence of nodularity. Central pulmonary vasculature is normal to the extent evaluated on noncontrast imaging. Mediastinum/Nodes: No internal mammary adenopathy. No thoracic inlet lymphadenopathy. No axillary  adenopathy by size criteria. There is small LEFT retropectoral lymph nodes largest measuring 8 mm (image 24/2) this is little changed compared to previous imaging. No supraclavicular adenopathy on the LEFT. No mediastinal or hilar adenopathy.  Esophagus grossly normal. Lungs/Pleura: LEFT upper lobe nodule (image 20/3) 11 x 11 mm previously 10 x 8 mm, overall increasing in size slightly in conspicuity since previous imaging. On coronal and sagittal imaging this appears more discretely nodular than purely bandlike. No consolidation.  No pleural effusion.  Airways are patent. Upper Abdomen: Incidental imaging of upper abdominal contents without acute findings. Imaged portions the liver, gallbladder, pancreas, spleen, adrenal glands and kidneys are unremarkable. Musculoskeletal: Surgical clips in the LEFT breast. Added density throughout the LEFT breast and Verus areas (image 56/2) area measuring 3.0 x 1.9 cm for example is contiguous with another area which measures 2.9 x 2.6 cm and a third area more cephalad and medial measuring 3.0 x 2.7 cm. Spinal degenerative changes. No destructive bony findings or acute bone process. IMPRESSION: 1. Increasing conspicuity and size of a previously bandlike and now nodular area of density in the LEFT upper lobe suspicious for metastatic process. PET scan and or biopsy could be helpful for further evaluation. 2. Stable small LEFT retropectoral lymph nodes. These warrant attention on follow-up imaging based on patient history. 3. Added density throughout the LEFT breast associated with surgical clips presumably postoperative and or post treatment related. 4. Aortic atherosclerosis. Aortic Atherosclerosis (ICD10-I70.0). Electronically Signed   By: Zetta Bills M.D.   On: 06/26/2022 16:13    Labs:  CBC: Recent Labs    02/26/22 0958 03/30/22 1117 05/04/22 1044 06/29/22 1122  WBC 5.5 4.5 3.7* 4.4  HGB 11.3* 11.1* 11.7* 12.8  HCT 33.5* 33.8* 35.4* 37.8  PLT 172 174 166 181     COAGS: No results for input(s): "INR", "APTT" in the last 8760 hours.  BMP: Recent Labs    02/26/22 0958 03/30/22 1117 05/04/22 1044 06/29/22 1122  NA 136 137 139 138  K 4.1 4.2 4.2 4.4  CL 104 106 105 105  CO2 21* '27 30 28  '$ GLUCOSE 214* 112* 112* 99  BUN '22 20 14 16  '$ CALCIUM 8.9 8.9 9.3 9.2  CREATININE 0.82 0.88 0.94 0.93  GFRNONAA >60 >60 >60 >60    LIVER FUNCTION TESTS: Recent Labs    02/26/22 0958 03/30/22 1117 05/04/22 1044 06/29/22 1122  BILITOT 0.4 0.3 0.4 0.5  AST '21 23 22 21  '$ ALT '14 16 15 16  '$ ALKPHOS 78 74 75 75  PROT 7.3 6.6 6.3* 7.1  ALBUMIN 3.9 3.7 3.8 4.1    TUMOR MARKERS: No results for input(s): "AFPTM", "CEA", "CA199", "CHROMGRNA" in the last 8760 hours.  Assessment and Plan: History of left breast cancer. Patient followed by Dr. Alvy Bimler s/p lumpectomy and currently on maintenance therapy. Patient found to have enlargement of known left upper lobe nodule on recent scan.  Patient underwent robotic assisted bronchoscopy 07/10/2022 to biopsy left upper lobe nodule.  Biopsy resulted lymphatic inflammation only.  Due to concern for malignancy, Dr. Verlee Monte has referred patient to IR for CT-guided biopsy of lung nodule.  Dr. Dwaine Gale, IR, approved CT-guided left apical lung nodule.  PET 07/09/22:  IMPRESSION: 1. Hypermetabolic apical left upper lobe nodule, worrisome for primary bronchogenic carcinoma. Metastatic disease is also possibility. 2. Mild residual hypermetabolism associated with the left breast, likely postoperative/post treatment in etiology. Difficult to exclude residual disease. 3. Punctate left renal stone. 4. Aortic atherosclerosis (ICD10-I70.0). coronary artery calcification.  Risks and benefits of CT guided lung nodule biopsy was discussed with the patient including, but not limited to bleeding, hemoptysis, respiratory failure requiring intubation, infection, pneumothorax requiring chest tube placement, stroke from air  embolism or even  death.  All of the patient's questions were answered and the patient is agreeable to proceed.  Consent signed and in chart.   Thank you for this interesting consult.  I greatly enjoyed meeting Starleen Trussell and look forward to participating in their care.  A copy of this report was sent to the requesting provider on this date.  Electronically Signed: Tyson Alias, NP 07/20/2022, 9:25 AM   I spent a total of 20 minutes in face to face in clinical consultation, greater than 50% of which was counseling/coordinating care for left pulmonary nodule.

## 2022-07-20 NOTE — Progress Notes (Signed)
Spoke to Dr Reesa Chew and notified of client with c/o gas bubble feeling left chest and states had that feeling move to upper abd, Dr Reesa Chew looked at cxr and per Dr Reesa Chew ok to d/c home and give client food and drink

## 2022-07-20 NOTE — Progress Notes (Addendum)
Client c/o discomfort left chest and states "feels like a gas bubble". States not pain

## 2022-07-23 ENCOUNTER — Encounter: Payer: Self-pay | Admitting: *Deleted

## 2022-07-23 LAB — SURGICAL PATHOLOGY

## 2022-07-24 ENCOUNTER — Telehealth: Payer: Self-pay | Admitting: Hematology and Oncology

## 2022-07-24 ENCOUNTER — Other Ambulatory Visit: Payer: Self-pay | Admitting: Hematology and Oncology

## 2022-07-24 DIAGNOSIS — R911 Solitary pulmonary nodule: Secondary | ICD-10-CM

## 2022-07-24 DIAGNOSIS — C50412 Malignant neoplasm of upper-outer quadrant of left female breast: Secondary | ICD-10-CM

## 2022-07-24 DIAGNOSIS — C541 Malignant neoplasm of endometrium: Secondary | ICD-10-CM

## 2022-07-24 NOTE — Telephone Encounter (Signed)
I have reviewed results of her recent biopsy that came back nonmalignant I plan to repeat CT imaging end of November to follow

## 2022-07-24 NOTE — Telephone Encounter (Signed)
Contacted patient to scheduled appointments. Patient is aware of appointments that are scheduled.   

## 2022-07-27 ENCOUNTER — Encounter: Payer: Self-pay | Admitting: *Deleted

## 2022-07-29 NOTE — Progress Notes (Signed)
Radiation Oncology         ([phone]) [phone] ________________________________  Name: Kristin Carlson MRN: [id number]  Date: 07/30/2022  DOB: 08-29-1941  Follow-Up Visit Note  CC: [name], FNP  Lafonda Mosses, MD  No diagnosis found.  Diagnosis: The encounter diagnosis was Endometrial cancer ([place]).   Stage III (pT3, pN0, cM0) endometrial cancer, high-grade serous   New diagnosis of Left Breast UOQ, Invasive Mammary Carcinoma with DCIS, ER+ / PR+ / Her2-, Grade 2 : s/p lumpectomy   Interval Since Last Radiation: 6 months and 17 days   Intent: Curative  Radiation Treatment Dates: 12/18/2021 through 01/13/2022 Site Technique Total Dose (Gy) Dose per Fx (Gy) Completed Fx Beam Energies  Vagina: Pelvis HDR-brachy 30/30 6 5/5 Ir-192    Narrative:  The patient returns today for routine follow-up, she was last seen here for follow up on 02/16/22. Since her last visit, the patient completed her final cycle of chemotherapy consisting of  taxol and carboplatin on 02/26/22 under the care of Dr. Alvy Bimler. Following completion of systemic treatment, the patient began antiestrogen therapy consisting of armidex which she continues to tolerate well.       The patient also followed up with Dr. Berline Lopes on 04/06/22.  During which time, the patient reported ongoing significant fatigue from adjuvant treatment. She also reported intermittent constipation, loose stools, both urge and stress urinary incontinence, and recent lower extremity edema x 1 month (intermittently worse on the left). Otherwise, the patient denied any concerning symptoms and was noted as NED on examination.       In regards to her left breast cancer, follow up left breast diagnostic mammogram and and left breast ultrasound performed on 03/20/22 showed a slight interval reduction in the size of the left breast malignancy; s/p chemotherapy treatment for endometrial carcinoma. (Measuring 1.9 x 1.0 x 1.1 cm, previously 1.9 x 1.2 x 1.7  cm).  The patient also underwent a CT of the chest abdomen and pelvis on 03/27/22 which showed the previously seen bandlike opacity in the medial left lung apex as progressive in the interval, and with a new nodular component measuring up to 10 mm. Otherwise, no evidence of metastatic disease was appreciated within the chest, abdomen, or pelvis.  The patient opted to proceed with left breast lumpectomy without nodal biopsies on 06/16/22 under the care of Dr. Donne Hazel. Pathology from the procedure revealed: tumor the size of 1.7 cm in the greatest dimension; histology of grade 2 invasive mammary carcinoma with intermediate grade DCIS. Prognostic indicators significant for: estrogen receptor >90% positive with strong staining intensity; progesterone receptor 11-50% positive with moderate staining intensity; Proliferation marker Ki67 not performed; Her2 status negative by IHC; Grade 2.   Chest CT without contrast on 06/26/22 showed an increase in conspicuity and size of a previously bandlike but  nodular area of density in the left upper lobe suspicious for metastatic process. CT also showed stable small left retropectoral lymph nodes, and post-op findings associated with recent left breast surgery.  PET scan on 07/08/22 showed a hypermetabolic apical left upper lobe nodule, concerning for bronchogenic carcinoma vs metastatic disease. PET also showed a mild residual hypermetabolism associated with the left breast, likely postoperative/post treatment in etiology. However residual disease could not be entirely excluded.   Accordingly, the patient was referred to Dr. Verlee Monte for bronchoscopy on 07/10/22. FNA, washing, and brushings of the LUL collected showed no evidence of malignancy.   Given features concerning for malignancy, the patient underwent needle core  biopsy of the LUL on 07/20/22 showed necrotizing granulomatous inflammation.   Given negative findings, Dr. Alvy Bimler would like to see the patient  back in November for repeat imaging.                    Allergies:  is allergic to lactose, codeine, penicillins, sulfa antibiotics, and vancomycin.  Meds: Current Outpatient Medications  Medication Sig Dispense Refill   acetaminophen (TYLENOL) 500 MG tablet Take 1,000 mg by mouth every 6 (six) hours as needed (pain.).     anastrozole (ARIMIDEX) 1 MG tablet Take 1 tablet (1 mg total) by mouth daily. (Patient taking differently: Take 1 mg by mouth every evening.) 90 tablet 1   Cholecalciferol (VITAMIN D3) 50 MCG (2000 UT) TABS Take 2,000 Units by mouth in the morning.     furosemide (LASIX) 20 MG tablet Take 10 mg by mouth in the morning. Hold for activities/errands     gabapentin (NEURONTIN) 300 MG capsule Take 1 capsule (300 mg total) by mouth 2 (two) times daily. 60 capsule 3   ibuprofen (ADVIL) 200 MG tablet Take 400 mg by mouth every 8 (eight) hours as needed (severe pain.).     levothyroxine (SYNTHROID) 125 MCG tablet Take 125 mcg by mouth daily before breakfast.     losartan (COZAAR) 50 MG tablet Take 50 mg by mouth in the morning.     magnesium oxide (MAG-OX) 400 MG tablet Take 400 mg by mouth at bedtime.     Multiple Vitamin (MULTIVITAMIN WITH MINERALS) TABS tablet Take 1 tablet by mouth in the morning.     Omega-3 1000 MG CAPS Take 1,000-2,000 mg by mouth See admin instructions. Take 2 capsules by mouth in the morning & take 1 capsule by mouth at night.     ondansetron (ZOFRAN) 8 MG tablet Take 1 tablet (8 mg total) by mouth every 8 (eight) hours as needed. (Patient taking differently: Take 8 mg by mouth every 8 (eight) hours as needed for nausea or vomiting.) 30 tablet 1   pantoprazole (PROTONIX) 20 MG tablet Take 20 mg by mouth daily as needed for heartburn or indigestion.     prochlorperazine (COMPAZINE) 10 MG tablet Take 10 mg by mouth every 6 (six) hours as needed for nausea or vomiting.     propranolol (INDERAL) 40 MG tablet Take 40 mg by mouth 2 (two) times daily.      rosuvastatin (CRESTOR) 5 MG tablet Take 5 mg by mouth at bedtime.     senna-docusate (SENOKOT-S) 8.6-50 MG tablet Take 2 tablets by mouth 2 (two) times daily. (Patient taking differently: Take 2 tablets by mouth 2 (two) times daily as needed (constipation.).) 60 tablet 0   Simethicone 125 MG TABS Take 250 mg by mouth 2 (two) times daily as needed (gas).     temazepam (RESTORIL) 15 MG capsule Take 15 mg by mouth at bedtime as needed for sleep.     No current facility-administered medications for this encounter.    Physical Findings: The patient is in no acute distress. Patient is alert and oriented.  vitals were not taken for this visit. .  No significant changes. Lungs are clear to auscultation bilaterally. Heart has regular rate and rhythm. No palpable cervical, supraclavicular, or axillary adenopathy. Abdomen soft, non-tender, normal bowel sounds.  Right Breast: no palpable mass, nipple discharge or bleeding. Left Breast: ***    Lab Findings: Lab Results  Component Value Date   WBC 4.6 07/20/2022   HGB 13.2  07/20/2022   HCT 39.8 07/20/2022   MCV 93.2 07/20/2022   PLT 170 07/20/2022    Radiographic Findings: DG Chest Port 1 View  Result Date: 07/20/2022 CLINICAL DATA:  Left-sided chest discomfort. Left upper lobe pulmonary nodule. Status post biopsy earlier today. EXAM: PORTABLE CHEST 1 VIEW COMPARISON:  CT scan from earlier today.  Chest x-ray 07/10/2022. FINDINGS: The lungs are clear without focal pneumonia, edema, pneumothorax or pleural effusion. The tiny left apical pneumothorax seen on the CT scan earlier today is not definitely visible by x-ray although there may be a barely discernible pleural line just below the posterior left third rib. The cardiopericardial silhouette is within normal limits for size. IMPRESSION: Possible tiny left apical pneumothorax. Otherwise no acute findings. Left upper lobe pulmonary lesion not well demonstrated by x-ray. Electronically Signed   By:  Misty Stanley M.D.   On: 07/20/2022 14:54   CT LUNG MASS BIOPSY  Result Date: 07/20/2022 INDICATION: PET positive enlarging left upper lobe nodule EXAM: CT-GUIDED BIOPSY LEFT UPPER LOBE NODULE MEDICATIONS: 1% LIDOCAINE LOCAL ANESTHESIA/SEDATION: 1.0 mg IV Versed; 50 mcg IV Fentanyl Moderate Sedation Time:  18 MINUTE The patient was continuously monitored during the procedure by the interventional radiology nurse under my direct supervision. PROCEDURE: The procedure, risks, benefits, and alternatives were explained to the patient. Questions regarding the procedure were encouraged and answered. The patient understands and consents to the procedure. Previous imaging reviewed. Patient position prone. Noncontrast localization CT performed. The posteromedial left upper lobe nodule was localized and marked biopsy. Under sterile conditions and local anesthesia, a 17 gauge 6.8 cm access needle was advanced from a posterior oblique approach to the nodule. Needle position confirmed with CT. 1 cm 18 gauge core biopsies obtained. Samples were fragmented and placed in formalin. Needle tract occluded with the bio sentry device. Postprocedure imaging demonstrates a trace amount of surrounding Peri lesion hemorrhage an 8 miniscule pneumothorax. Patient tolerated the procedure well without complication. Vital sign monitoring by nursing staff during the procedure will continue as patient is in the special procedures unit for post procedure observation. FINDINGS: The images document guide needle placement within the posteromedial left upper lobe nodule. Post biopsy images demonstrate trace surrounding lesion hemorrhage and meniscal pneumothorax. COMPLICATIONS: SIR Level A - No therapy, no consequence. IMPRESSION: Successful CT-guided core biopsy of the posteromedial left upper lobe nodule RADIATION DOSE REDUCTION: This exam was performed according to the departmental dose-optimization program which includes automated exposure  control, adjustment of the mA and/or kV according to patient size and/or use of iterative reconstruction technique. Electronically Signed   By: Jerilynn Mages.  Shick M.D.   On: 07/20/2022 13:44   DG Chest Port 1 View  Result Date: 07/10/2022 CLINICAL DATA:  CHF, status post bronchoscopy EXAM: PORTABLE CHEST 1 VIEW COMPARISON:  None Available. FINDINGS: Unchanged cardiomediastinal silhouette. Left medial apical nodular opacity with new biopsy marker. No pleural effusion. No pneumothorax. No acute osseous abnormality. Cervical spine fusion hardware noted. IMPRESSION: Post biopsy changes in the left medial apex. No evidence of pneumothorax. Electronically Signed   By: Maurine Simmering M.D.   On: 07/10/2022 10:08   DG C-ARM BRONCHOSCOPY  Result Date: 07/10/2022 C-ARM BRONCHOSCOPY: Fluoroscopy was utilized by the requesting physician.  No radiographic interpretation.   DG C-Arm 1-60 Min-No Report  Result Date: 07/10/2022 Fluoroscopy was utilized by the requesting physician.  No radiographic interpretation.   NM PET Image Initial (PI) Skull Base To Thigh  Result Date: 07/09/2022 CLINICAL DATA:  Initial treatment strategy for  invasive breast cancer, endometrial cancer. EXAM: NUCLEAR MEDICINE PET SKULL BASE TO THIGH TECHNIQUE: 8.9 mCi F-18 FDG was injected intravenously. Full-ring PET imaging was performed from the skull base to thigh after the radiotracer. CT data was obtained and used for attenuation correction and anatomic localization. Fasting blood glucose: 96 mg/dl COMPARISON:  CT chest 06/26/2022, CT chest abdomen pelvis 03/27/2022. FINDINGS: Mediastinal blood pool activity: SUV max 2.9 Liver activity: SUV max NA NECK: No abnormal hypermetabolism. Incidental CT findings: None. CHEST: No hypermetabolic mediastinal hilar or. 11 mm nodule in the apical left upper lobe SUV max of 7.0. No additional hypermetabolic nodules. Very mild hypermetabolism in the distal esophagus, physiologic. Hypermetabolism associated with  postoperative/post treatment changes in the left breast, SUV max 3.5. Incidental CT findings: Atherosclerotic calcification of the aorta, aortic valve and coronary arteries. Heart is enlarged. No pericardial or pleural effusion. ABDOMEN/PELVIS: No abnormal hypermetabolism in the liver, adrenal glands, spleen or pancreas. No hypermetabolic lymph nodes Incidental CT findings: Liver, gallbladder, adrenal glands and right kidney are unremarkable. There may be a punctate stone in the lower pole left kidney. Spleen, pancreas, stomach and bowel are grossly unremarkable. Atherosclerotic calcification of the aorta. SKELETON: No abnormal hypermetabolism. Incidental CT findings: Bilateral hip arthroplasties.  Degenerative changes in the spine. IMPRESSION: 1. Hypermetabolic apical left upper lobe nodule, worrisome for primary bronchogenic carcinoma. Metastatic disease is also possibility. 2. Mild residual hypermetabolism associated with the left breast, likely postoperative/post treatment in etiology. Difficult to exclude residual disease. 3. Punctate left renal stone. 4. Aortic atherosclerosis (ICD10-I70.0). coronary artery calcification. Electronically Signed   By: Lorin Picket M.D.   On: 07/09/2022 11:31    Impression:  The encounter diagnosis was Endometrial cancer ([place]).   Stage III (pT3, pN0, cM0) endometrial cancer, high-grade serous   New diagnosis of Left Breast UOQ, Invasive Mammary Carcinoma with DCIS, ER+ / PR+ / Her2-, Grade 2: s/p lumpectomy   The patient is recovering from the effects of radiation.  ***  Plan:  ***   *** minutes of total time was spent for this patient encounter, including preparation, face-to-face counseling with the patient and coordination of care, physical exam, and documentation of the encounter. ____________________________________  Blair Promise, PhD, MD  This document serves as a record of services personally performed by Gery Pray, MD. It was created on his  behalf by Roney Mans, a trained medical scribe. The creation of this record is based on the scribe's personal observations and the provider's statements to them. This document has been checked and approved by the attending provider.

## 2022-07-30 ENCOUNTER — Ambulatory Visit
Admission: RE | Admit: 2022-07-30 | Discharge: 2022-07-30 | Disposition: A | Discharge status: Home / Self Care | Payer: Medicare Other | Source: Ambulatory Visit | Attending: Radiation Oncology | Admitting: Radiation Oncology

## 2022-07-30 ENCOUNTER — Encounter: Payer: Self-pay | Admitting: Radiation Oncology

## 2022-07-30 DIAGNOSIS — Z96643 Presence of artificial hip joint, bilateral: Secondary | ICD-10-CM | POA: Insufficient documentation

## 2022-07-30 DIAGNOSIS — R140 Abdominal distension (gaseous): Secondary | ICD-10-CM | POA: Diagnosis not present

## 2022-07-30 DIAGNOSIS — R197 Diarrhea, unspecified: Secondary | ICD-10-CM | POA: Diagnosis not present

## 2022-07-30 DIAGNOSIS — Z8542 Personal history of malignant neoplasm of other parts of uterus: Secondary | ICD-10-CM | POA: Insufficient documentation

## 2022-07-30 DIAGNOSIS — Z923 Personal history of irradiation: Secondary | ICD-10-CM | POA: Diagnosis not present

## 2022-07-30 DIAGNOSIS — I517 Cardiomegaly: Secondary | ICD-10-CM | POA: Diagnosis not present

## 2022-07-30 DIAGNOSIS — N200 Calculus of kidney: Secondary | ICD-10-CM | POA: Insufficient documentation

## 2022-07-30 DIAGNOSIS — C541 Malignant neoplasm of endometrium: Secondary | ICD-10-CM

## 2022-07-30 DIAGNOSIS — I700 Atherosclerosis of aorta: Secondary | ICD-10-CM | POA: Diagnosis not present

## 2022-07-30 NOTE — Progress Notes (Signed)
Emerson Barretto is here today for follow up post radiation to the pelvic.  They completed their radiation on: 01/13/22  Does the patient complain of any of the following:  Pain:No Abdominal bloating: Yes Diarrhea/Constipation: Yes, both diarrhea and constipation.  Nausea/Vomiting: No Vaginal Discharge: No Blood in Urine or Stool: No Urinary Issues (dysuria/incomplete emptying/ incontinence/ increased frequency/urgency): Urgency and stress incontinence.  Does patient report using vaginal dilator 2-3 times a week and/or sexually active 2-3 weeks: No Post radiation skin changes: Not to treatment area.. Patient reports having new area to left buttocks that she thinks is a mole.    Additional comments if applicable:  BP (!) 330/07 (BP Location: Left Arm, Patient Position: Sitting, Cuff Size: Large)   Pulse 61   Temp 98 F (36.7 C) (Oral)   Resp 17   Ht '5\' 3"'$  (1.6 m)   Wt 182 lb (82.6 kg)   SpO2 99%   BMI 32.24 kg/m

## 2022-08-11 ENCOUNTER — Telehealth: Payer: Self-pay

## 2022-08-11 NOTE — Telephone Encounter (Signed)
Enid Derry from ([facility]) called office.  Pt is scheduled for a follow up on 10/20/22  at 2:15 with Dr.Tucker.  Enid Derry to notify pt of appointment date and time.

## 2022-08-12 ENCOUNTER — Telehealth: Payer: Self-pay | Admitting: *Deleted

## 2022-08-12 NOTE — Telephone Encounter (Signed)
CALLED PATIENT TO INFORM OF FU WITH DR. Berline Lopes ON 10-20-22- ARRIVAL TIME- 2 PM, SPOKE WITH PATIENT AND SHE IS AWARE OF THIS APPT.

## 2022-09-03 ENCOUNTER — Ambulatory Visit: Payer: TRICARE For Life (TFL) | Admitting: Family

## 2022-09-07 ENCOUNTER — Telehealth: Payer: Self-pay

## 2022-09-07 ENCOUNTER — Other Ambulatory Visit: Payer: Self-pay | Admitting: Hematology and Oncology

## 2022-09-07 MED ORDER — VENLAFAXINE HCL ER 37.5 MG PO CP24: 37.5000 mg | ORAL_CAPSULE | Freq: Every day | ORAL | 1 refills | Status: DC

## 2022-09-07 NOTE — Telephone Encounter (Signed)
Returned her call. She is taking Arimidex and complaining of hot flashes. She was taking Gabapentin but has stopped due to it making her feel like a zombie. She stopped over 1 week ago. She started taking the Gabapentin 300 mg BID and it was reduced to 300 mg daily. This did not help with the zombie feeling. She is asking if you can send a reduced dose of the gabapentin that could be adjusted if needed [facility]?

## 2022-09-07 NOTE — Telephone Encounter (Signed)
Called and given below message. She verbalize understanding and will call the office back if needed.

## 2022-09-07 NOTE — Telephone Encounter (Signed)
If she has sedation with gabapentin I would not recommend her to continue There are other options

## 2022-09-07 NOTE — Telephone Encounter (Signed)
I sent prescription for low dose effexor, will call her in 1-2 weeks for benefit Tell her it is low dose

## 2022-09-07 NOTE — Telephone Encounter (Signed)
Called and left below message. Ask her to call the office back. 

## 2022-09-07 NOTE — Telephone Encounter (Signed)
She called back. She is willing to try any medication that Dr. Alvy Bimler recommends for the hot flashes. She ask that you send Rx to [facility].

## 2022-09-08 ENCOUNTER — Other Ambulatory Visit: Payer: Self-pay | Admitting: Hematology and Oncology

## 2022-10-20 ENCOUNTER — Encounter: Payer: Self-pay | Admitting: Gynecologic Oncology

## 2022-10-20 ENCOUNTER — Inpatient Hospital Stay: Payer: Medicare Other | Attending: Gynecologic Oncology | Admitting: Gynecologic Oncology

## 2022-10-20 VITALS — BP 171/83 | HR 61 | Temp 98.6°F | Resp 18 | Ht 63.0 in | Wt 185.0 lb

## 2022-10-20 DIAGNOSIS — Z90722 Acquired absence of ovaries, bilateral: Secondary | ICD-10-CM | POA: Insufficient documentation

## 2022-10-20 DIAGNOSIS — Z8542 Personal history of malignant neoplasm of other parts of uterus: Secondary | ICD-10-CM | POA: Diagnosis present

## 2022-10-20 DIAGNOSIS — Z923 Personal history of irradiation: Secondary | ICD-10-CM | POA: Diagnosis not present

## 2022-10-20 DIAGNOSIS — Z90710 Acquired absence of both cervix and uterus: Secondary | ICD-10-CM | POA: Diagnosis not present

## 2022-10-20 DIAGNOSIS — Z79899 Other long term (current) drug therapy: Secondary | ICD-10-CM | POA: Diagnosis not present

## 2022-10-20 DIAGNOSIS — Z9221 Personal history of antineoplastic chemotherapy: Secondary | ICD-10-CM | POA: Insufficient documentation

## 2022-10-20 DIAGNOSIS — N951 Menopausal and female climacteric states: Secondary | ICD-10-CM | POA: Diagnosis not present

## 2022-10-20 DIAGNOSIS — C541 Malignant neoplasm of endometrium: Secondary | ICD-10-CM

## 2022-10-20 DIAGNOSIS — R232 Flushing: Secondary | ICD-10-CM

## 2022-10-20 MED ORDER — VENLAFAXINE HCL ER 37.5 MG PO CP24: 37.5000 mg | ORAL_CAPSULE | Freq: Every day | ORAL | 6 refills | Status: DC

## 2022-10-20 NOTE — Progress Notes (Signed)
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 Evaluation of the pelvis is significantly limited by dense metallic streak artifact from adjacent hip arthroplasty. Within this limitation, there is expansile, masslike, heterogeneously enhancing appearance of the endometrium, measuring at least 5.2 cm in thickness. Findings are in keeping with reported diagnosis of endometrial malignancy. 2. No evidence of lymphadenopathy or metastatic disease in the chest, abdomen, or pelvis. 3. There is a 0.4 cm fissural nodule the superior segment left lower lobe, almost certainly a benign intrapulmonary lymph node. Attention on follow-up. 4. Coronary artery disease.   10/14/2021 Surgery   TRH/BSO, right SLN biopsy, left pelvic and PA LND, peritoneal nodule biopsy, mini-lap for specimen delivery  Findings: On EUA, 8cm bulbous uterus. ON intra-abdominal entry, normal upper abdominal survey. Normal omentum, small and large bowel. Uterus 8-10cm and bulbous. Normal appearing adnexa. Mapping successful on the right, no mapping on the left. Some mildly prominent lymph nodes bilaterally in the pelvis. Small, <5 mm, nodule in the cul de sac. No obvious intra-abdominal or pelvic evidence of disease.   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 SENTINEL LYMPH NODE, RIGHT EXTERNAL ILIAC,  BIOPSY:  - Lymph node, negative for carcinoma (0/1)   B. SENTINEL LYMPH NODE, RIGHT OBTURATOR AND SURROUNDING LYMPH NODES,  BIOPSY:  - Lymph nodes, negative for carcinoma (0/5)   C. UTERUS, CERVIX, BILATERAL FALLOPIAN TUBES AND OVARIES:  - Invasive high-grade serous carcinoma  - Carcinoma focally invades into the serosal surface  - Lymphovascular invasion is present  - Benign unremarkable cervix  - Benign unremarkable bilateral fallopian tubes and ovaries  - See oncology table   D. CUL DE SAC NODULE, POSTERIOR, BIOPSY:  - Endometriosis   E. LYMPH NODE, LEFT PELVIC, BIOPSY:  - Lymph nodes, negative for carcinoma (0/3)   F. LYMPH NODE, LEFT PARAORTIC, BIOPSY:  - Lymph nodes, negative for carcinoma (0/5)   ONCOLOGY TABLE:   UTERUS, CARCINOMA OR CARCINOSARCOMA: Resection   Procedure: Total hysterectomy and bilateral salpingo-oophorectomy  Histologic Type: Serous carcinoma  Histologic Grade: High-grade  Myometrial Invasion:       Depth of Myometrial Invasion (mm): 32 mm       Myometrial Thickness (mm): 32 mm       Percentage of Myometrial Invasion: 100%  Uterine Serosa Involvement: Present, focal  Cervical stromal Involvement: Not identified  Extent of involvement of other tissue/organs: Not identified  Peritoneal/Ascitic Fluid: Negative for carcinoma  Lymphovascular Invasion: Present  Regional Lymph Nodes:       Pelvic Lymph Nodes Examined:                                   6 Sentinel  3 Non-sentinel                                   9 Total       Pelvic Lymph Nodes with Metastasis: 0                           Macrometastasis: (>2.0 mm): 0                           Micrometastasis: (>0.2 mm and < 2.0 mm): 0                           Isolated Tumor Cells (<0.2 mm): 0                           Laterality of Lymph Node with Tumor: Not  applicable                           Extracapsular Extension: Not applicable       Para-aortic Lymph  Nodes Examined:                                    0 Sentinel                                    5 Non-sentinel                                    5 Total       Para-aortic Lymph Nodes with Metastasis: 0                           Macrometastasis: (>2.0 mm): 0                           Micrometastasis:  (>0.2 mm and < 2.0 mm): 0                           Isolated Tumor Cells (<0.2 mm): 0                           Laterality of Lymph Node with Tumor: Not  applicable                           Extracapsular Extension: Not applicable  Distant Metastasis:       Distant Site(s) Involved: Not applicable  Pathologic Stage Classification (pTNM, AJCC 8th Edition): pT3a, pN0  Ancillary Studies: MMR / MSI testing will be ordered  Representative Tumor Block: C4  Comment(s): Pancytokeratin was performed on the lymph nodes and is negative.   By immunohistochemistry, HER-2 is EQUIVOCAL (2+).  HER-2 by FISH is pending and will be reported in an addendum.   FLOURESCENCE IN-SITU HYBRIDIZATION RESULTS:   GROUP 1:  HER2 **POSITIVE**   On the tissue sample received from  this individual HER2 FISH was performed by a technologist and cell imaging and analysis on the [employer].   RATIO of HER2/CEN 17 SIGNALS: 2.50  AVERAGE HER2 COPY NUMBER PER CELL: 4.74   The ratio of HER2/CEN 17 result exceeds the cutoff value of >=2.0 and a copy number of HER2 signals exceeding the cutoff range of >=4.0 signals per cell.  Arch Pathol Lab Med 1:1, 2018.    10/28/2021 Cancer Staging   Staging form: Corpus Uteri - Carcinoma and Carcinosarcoma, AJCC 8th Edition - Pathologic stage from 10/28/2021: Stage III (pT3, pN0, cM0) - Signed by Heath Lark, MD on 10/28/2021 Stage prefix: Initial diagnosis   11/07/2021 Procedure   Successful placement of a right internal jugular approach power injectable Port-A-Cath. The catheter is ready for immediate use.   11/11/2021 - 02/26/2022 Chemotherapy   Patient is on Treatment Plan : UTERINE  Carboplatin AUC 6 / Paclitaxel q21d      Genetic Testing   Negative genetic testing. No pathogenic variants identified on the [facility] Multi-Cancer+RNA panel. VUS in MET called c.3220A>G, VUS in MUTYh called c.1417G>A and VUS in POLE called c.596G>T identified. The report date is 01/20/2022.  The Multi-Cancer Panel + RNA offered by [facility] includes sequencing and/or deletion duplication testing of the following 84 genes: AIP, ALK, APC, ATM, AXIN2,BAP1,  BARD1, BLM, BMPR1A, BRCA1, BRCA2, BRIP1, CASR, CDC73, CDH1, CDK4, CDKN1B, CDKN1C, CDKN2A (p14ARF), CDKN2A (p16INK4a), CEBPA, CHEK2, CTNNA1, DICER1, DIS3L2, EGFR (c.2369C>T, p.Thr790Met variant only), EPCAM (Deletion/duplication testing only), FH, FLCN, GATA2, GPC3, GREM1 (Promoter region deletion/duplication testing only), HOXB13 (c.251G>A, p.Gly84Glu), HRAS, KIT, MAX, MEN1, MET, MITF (c.952G>A, p.Glu318Lys variant only), MLH1, MSH2, MSH3, MSH6, MUTYH, NBN, NF1, NF2, NTHL1, PALB2, PDGFRA, PHOX2B, PMS2, POLD1, POLE, POT1, PRKAR1A, PTCH1, PTEN, RAD50, RAD51C, RAD51D, RB1, RECQL4, RET, RUNX1, SDHAF2, SDHA (sequence changes only), SDHB, SDHC, SDHD, SMAD4, SMARCA4, SMARCB1, SMARCE1, STK11, SUFU, TERC, TERT, TMEM127, TP53, TSC1, TSC2, VHL, WRN and WT1.   03/30/2022 Imaging   1. Bandlike opacity in the medial left lung apex is progressive in the interval, now with nodular component measuring up to 10 mm. While likely related to evolving atelectasis or scar, consider follow-up CT chest in 3 months to reassess. 2. Interval hysterectomy. 3. No evidence for metastatic disease in the chest, abdomen, or pelvis. 4. Trace free fluid in the upper pelvis, nonspecific. Of note, inferior pelvis is essentially obscured by beam hardening artifact from bilateral hip replacement. 5. Left colonic diverticulosis without diverticulitis. 6. Moderate stool volume. Imaging features could be compatible with constipation in the appropriate clinical setting. 7. Aortic Atherosclerosis  (ICD10-I70.0).   06/26/2022 Imaging   1. Increasing conspicuity and size of a previously bandlike and now nodular area of density in the LEFT upper lobe suspicious for metastatic process. PET scan and or biopsy could be helpful for further evaluation. 2. Stable small LEFT retropectoral lymph nodes. These warrant attention on follow-up imaging based on patient history. 3. Added density throughout the LEFT breast associated with surgical clips presumably postoperative and or post treatment related. 4. Aortic atherosclerosis.   Aortic Atherosclerosis (ICD10-I70.0).   Breast cancer of upper-outer quadrant of left female breast ([place])  11/19/2021 Imaging   IMPRESSION: 1. Highly suspicious mass in the left breast at 1 o'clock measuring 1.9 cm.   2.  No mammographic evidence of malignancy in the right breast   11/27/2021 Procedure   Appropriate positioning of the venous shaped biopsy marking clip at the site of biopsy in the 1 o'clock location of the LEFT breast.  12/02/2021 Initial Diagnosis   Breast cancer of upper-outer quadrant of left female breast ([place])   12/02/2021 Cancer Staging   Staging form: Breast, AJCC 8th Edition - Clinical stage from 12/02/2021: cT1c, cN0, cM0 - Signed by Heath Lark, MD on 12/02/2021 Stage prefix: Initial diagnosis    Genetic Testing   Negative genetic testing. No pathogenic variants identified on the [facility] Multi-Cancer+RNA panel. VUS in MET called c.3220A>G, VUS in MUTYh called c.1417G>A and VUS in POLE called c.596G>T identified. The report date is 01/20/2022.  The Multi-Cancer Panel + RNA offered by [facility] includes sequencing and/or deletion duplication testing of the following 84 genes: AIP, ALK, APC, ATM, AXIN2,BAP1,  BARD1, BLM, BMPR1A, BRCA1, BRCA2, BRIP1, CASR, CDC73, CDH1, CDK4, CDKN1B, CDKN1C, CDKN2A (p14ARF), CDKN2A (p16INK4a), CEBPA, CHEK2, CTNNA1, DICER1, DIS3L2, EGFR (c.2369C>T, p.Thr790Met variant only), EPCAM (Deletion/duplication testing only), FH,  FLCN, GATA2, GPC3, GREM1 (Promoter region deletion/duplication testing only), HOXB13 (c.251G>A, p.Gly84Glu), HRAS, KIT, MAX, MEN1, MET, MITF (c.952G>A, p.Glu318Lys variant only), MLH1, MSH2, MSH3, MSH6, MUTYH, NBN, NF1, NF2, NTHL1, PALB2, PDGFRA, PHOX2B, PMS2, POLD1, POLE, POT1, PRKAR1A, PTCH1, PTEN, RAD50, RAD51C, RAD51D, RB1, RECQL4, RET, RUNX1, SDHAF2, SDHA (sequence changes only), SDHB, SDHC, SDHD, SMAD4, SMARCA4, SMARCB1, SMARCE1, STK11, SUFU, TERC, TERT, TMEM127, TP53, TSC1, TSC2, VHL, WRN and WT1.   03/30/2022 -  Anti-estrogen oral therapy   She is started on Arimidex   06/16/2022 Pathology Results   FINAL MICROSCOPIC DIAGNOSIS:   A. BREAST, LEFT, LUMPECTOMY:  -  Invasive mammary carcinoma (NST)/invasive ductal carcinoma (NOS) [retained membranous e-cadherin positivity], 1.7 cm in greatest dimension, Nottingham histologic score 2 out of 3.  -  Ductal carcinoma in situ (DCIS), cribriform type, nuclear grade 2 of 3.  -  Biomarkers, performed on the biopsy specimen, ER positive (90% strong), PR positive (11 to 50% moderate), HER2/neu negative  -  Margins negative in conjunction with parts a B and C (closest margin anterior at 5 mm and superior at 9 mm all others greater than 10 mm).  pT1c pNX pM n/a   B. BREAST, LEFT POSTERIOR MARGIN, EXCISION:  -  Benign breast tissue, negative for malignancy (new margin 1.5 cm)   C. BREAST, LEFT MEDIAL MARGIN, EXCISION:  -  Benign breast tissue, negative for malignancy (new margin 1.5 cm)   ONCOLOGY TABLE:   INVASIVE CARCINOMA OF THE BREAST:  Resection   Procedure: Lumpectomy  Specimen Laterality: Left  Histologic Type: Invasive mammary carcinoma of no special type  (NST)/invasive ductal carcinoma (NOS)  Histologic Grade:       Glandular (Acinar)/Tubular Differentiation: 3       Nuclear Pleomorphism: 2       Mitotic Rate: 1       Overall Grade: 2  Tumor Size: 1.7 x 1.5 x 1.0 cm  Ductal Carcinoma In Situ: Cribriform type, nuclear grade 2 of 3   Tumor Extent: Limited to breast parenchyma  Treatment Effect in the Breast: It is noted the patient has received  chemotherapy for endometrial carcinoma as well as antrum aromatase  inhibitor without morphologic evidence of tumor response  Margins: All margins negative for invasive carcinoma       Distance from Closest Margin (mm): 5 mm and 9 mm       Specify Closest Margin (required only if <48m): Anterior (5) and  Superior (9)  DCIS Margins: Uninvolved by DCIS       Distance from Closest Margin (mm): 5 mm       Specify Closest Margin (required only if <129m:  Posterior  Regional Lymph Nodes: Not applicable (no lymph nodes submitted or found)       Number of Lymph Nodes Examined: 0       Number of Sentinel Nodes Examined: 0       Number of Lymph Nodes with Macrometastases (>2 mm): N/A       Number of Lymph Nodes with Micrometastases: N/A       Number of Lymph Nodes with Isolated Tumor Cells (=0.2 mm or =200  cells): N/A       Size of Largest Metastatic Deposit (mm): N/A       Extranodal Extension: N/A  Distant Metastasis:       Distant Site(s) Involved: N/A  Breast Biomarker Testing Performed on Previous Biopsy:       Testing Performed on Case Number: [id number]             Estrogen Receptor: Positive, >90% strong             Progesterone Receptor: Positive, 11 to 50% moderate             HER2: IHC 0/3; negative             Ki-67: Not performed  Pathologic Stage Classification (pTNM, AJCC 8th Edition): pT1c, pNx  Representative Tumor Block: A5  Comment(s): [None]    06/16/2022 Surgery   Preoperative diagnosis: Left breast cancer, clinical stage I  Procedure: Left breast radioactive seed guided lumpectomy Surgeon: Dr. Serita Grammes   07/09/2022 PET scan   1. Hypermetabolic apical left upper lobe nodule, worrisome for primary bronchogenic carcinoma. Metastatic disease is also possibility. 2. Mild residual hypermetabolism associated with the left breast, likely  postoperative/post treatment in etiology. Difficult to exclude residual disease. 3. Punctate left renal stone.  4. Aortic atherosclerosis (ICD10-I70.0). coronary artery calcification.   07/20/2022 Pathology Results   A. LUNG, LEFT UPPER LOBE, NEEDLE CORE BIOPSY:  - Necrotizing granulomatous inflammation.  See comment.   COMMENT:   AFB, PAS-F, and GMS special stains are negative for organisms.      Interval History: Patient reports overall doing well.  Denies any recent change to symptoms.  She continues to struggle some with constipation as well as diarrhea.  She has some urinary frequency and incontinence, at baseline.  She denies any vaginal bleeding or discharge.  She denies any significant abdominal or pelvic pain.  She has intermittent bloating, at baseline.  Continues to struggle with hot flashes.  These were better controlled on gabapentin.  She is now on Effexor but has run out of this.  Scheduled for repeat chest imaging in late Nov.  Past Medical/Surgical History: Past Medical History:  Diagnosis Date   Actinic keratosis 06/04/2021   right anteromedial thigh   Anemia    Arthritis    Basal cell carcinoma 06/04/2021   right suprapubic, EDC 07/29/2021   Basal cell carcinoma 07/29/2021   left nasal ala. atypical basaloid cells   Family history of lung cancer    GERD (gastroesophageal reflux disease)    History of basal cell carcinoma 07/29/2021   left thigh, EDC at time of bx   History of radiation therapy    vaginal brachytherapy [place] 12/18/2021-01/13/2022 Dr Gery Pray   HLD (hyperlipidemia)    Hypertension    Hypothyroidism    Pneumonia    HX OF YEARS AGO   PONV (postoperative nausea and vomiting)     Past Surgical History:  Procedure Laterality Date   BREAST BIOPSY Left 11/27/2021   u/s  bs, venus clip,  INVASIVE MAMMARY CARCINOMA   BREAST CYST ASPIRATION     BREAST LUMPECTOMY WITH RADIOACTIVE SEED LOCALIZATION Left 06/16/2022   Procedure: LEFT BREAST LUMPECTOMY  WITH RADIOACTIVE SEED LOCALIZATION;  Surgeon: Rolm Bookbinder, MD;  Location: [facility];  Service: General;  Laterality: Left;   BRONCHIAL BIOPSY  07/10/2022   Procedure: BRONCHIAL BIOPSIES;  Surgeon: Maryjane Hurter, MD;  Location: [facility] ENDOSCOPY;  Service: Pulmonary;;   BRONCHIAL BRUSHINGS  07/10/2022   Procedure: BRONCHIAL BRUSHINGS;  Surgeon: Maryjane Hurter, MD;  Location: [facility] ENDOSCOPY;  Service: Pulmonary;;   BRONCHIAL NEEDLE ASPIRATION BIOPSY  07/10/2022   Procedure: BRONCHIAL NEEDLE ASPIRATION BIOPSIES;  Surgeon: Maryjane Hurter, MD;  Location: [facility] ENDOSCOPY;  Service: Pulmonary;;   BRONCHIAL WASHINGS  07/10/2022   Procedure: BRONCHIAL WASHINGS;  Surgeon: Maryjane Hurter, MD;  Location: [facility] ENDOSCOPY;  Service: Pulmonary;;   cataract Bilateral    FIDUCIAL MARKER PLACEMENT  07/10/2022   Procedure: FIDUCIAL MARKER PLACEMENT;  Surgeon: Maryjane Hurter, MD;  Location: [place];  Service: Pulmonary;;   HIP SURGERY     IR IMAGING GUIDED PORT INSERTION  11/07/2021   IR REMOVAL TUN ACCESS W/ PORT W/O FL MOD SED  01/06/2022   KNEE ARTHROSCOPY Left 2003   NECK SURGERY  1997   Fusion C3, C4, C5   ROBOTIC ASSISTED TOTAL HYSTERECTOMY WITH BILATERAL SALPINGO OOPHERECTOMY Bilateral 10/14/2021   Procedure: XI ROBOTIC ASSISTED TOTAL HYSTERECTOMY WITH BILATERAL SALPINGO OOPHORECTOMY, LYMPH NODE DISSECTION,  LAPAROTOMY;  Surgeon: Lafonda Mosses, MD;  Location: [facility];  Service: Gynecology;  Laterality: Bilateral;   SENTINEL NODE BIOPSY N/A 10/14/2021   Procedure: SENTINEL NODE DISSECTION;  Surgeon: Lafonda Mosses, MD;  Location: [facility];  Service: Gynecology;  Laterality: N/A;   THYROIDECTOMY  2007   TOTAL HIP ARTHROPLASTY Bilateral    Left Hip -2015, Right Hip 208   VIDEO BRONCHOSCOPY WITH RADIAL ENDOBRONCHIAL ULTRASOUND  07/10/2022   Procedure: RADIAL ENDOBRONCHIAL ULTRASOUND;  Surgeon: Maryjane Hurter, MD;  Location: [facility] ENDOSCOPY;  Service: Pulmonary;;     Family History  Problem Relation Age of Onset   Cancer Father        Lung Cancer   Lung cancer Father        smoked   Skin cancer Maternal Grandmother    Skin cancer Paternal Grandmother    Cancer Paternal Aunt        mouth   Colon cancer Neg Hx    Breast cancer Neg Hx    Ovarian cancer Neg Hx    Endometrial cancer Neg Hx    Pancreatic cancer Neg Hx    Prostate cancer Neg Hx     Social History   Socioeconomic History   Marital status: Married    Spouse name: Herbie [place]   Number of children: 2   Years of education: Not on file   Highest education level: Not on file  Occupational History   Not on file  Tobacco Use   Smoking status: Former    Packs/day: 0.50    Years: 3.00    Total pack years: 1.50    Types: Cigarettes    Quit date: 11/30/1966    Years since quitting: 55.9   Smokeless tobacco: Never  Vaping Use   Vaping Use: Never used  Substance and Sexual Activity   Alcohol use: Yes    Comment: RARE   Drug use: Never   Sexual activity: Not Currently  Other Topics Concern   Not  on file  Social History Narrative   Retired Therapist, sports   Social Determinants of Radio broadcast assistant Strain: Not on [employer] Insecurity: Not on file  Transportation Needs: Not on file  Physical Activity: Not on file  Stress: Not on file  Social Connections: Not on file    Current Medications:  Current Outpatient Medications:    acetaminophen (TYLENOL) 500 MG tablet, Take 1,000 mg by mouth every 6 (six) hours as needed (pain.)., Disp: , Rfl:    ARIMIDEX 1 MG tablet, TAKE 1 TABLET DAILY, Disp: 90 tablet, Rfl: 3   Cholecalciferol (VITAMIN D3) 50 MCG (2000 UT) TABS, Take 2,000 Units by mouth in the morning., Disp: , Rfl:    ibuprofen (ADVIL) 200 MG tablet, Take 400 mg by mouth every 8 (eight) hours as needed (severe pain.)., Disp: , Rfl:    levothyroxine (SYNTHROID) 125 MCG tablet, Take 125 mcg by mouth daily before breakfast., Disp: , Rfl:    losartan (COZAAR) 50 MG tablet, Take  50 mg by mouth in the morning., Disp: , Rfl:    magnesium oxide (MAG-OX) 400 MG tablet, Take 400 mg by mouth at bedtime., Disp: , Rfl:    Multiple Vitamin (MULTIVITAMIN WITH MINERALS) TABS tablet, Take 1 tablet by mouth in the morning., Disp: , Rfl:    Omega-3 1000 MG CAPS, Take 1,000-2,000 mg by mouth See admin instructions. Take 2 capsules by mouth in the morning & take 1 capsule by mouth at night., Disp: , Rfl:    ondansetron (ZOFRAN) 8 MG tablet, Take 1 tablet (8 mg total) by mouth every 8 (eight) hours as needed. (Patient taking differently: Take 8 mg by mouth every 8 (eight) hours as needed for nausea or vomiting.), Disp: 30 tablet, Rfl: 1   pantoprazole (PROTONIX) 20 MG tablet, Take 20 mg by mouth daily as needed for heartburn or indigestion., Disp: , Rfl:    prochlorperazine (COMPAZINE) 10 MG tablet, Take 10 mg by mouth every 6 (six) hours as needed for nausea or vomiting., Disp: , Rfl:    propranolol (INDERAL) 40 MG tablet, Take 40 mg by mouth 2 (two) times daily., Disp: , Rfl:    rosuvastatin (CRESTOR) 5 MG tablet, Take 5 mg by mouth at bedtime., Disp: , Rfl:    Simethicone 125 MG TABS, Take 250 mg by mouth 2 (two) times daily as needed (gas)., Disp: , Rfl:    temazepam (RESTORIL) 15 MG capsule, Take 15 mg by mouth at bedtime as needed for sleep., Disp: , Rfl:    venlafaxine XR (EFFEXOR-XR) 37.5 MG 24 hr capsule, Take 1 capsule (37.5 mg total) by mouth daily with breakfast., Disp: 60 capsule, Rfl: 6  Review of Systems: + Fatigue, ringing in ears, vision problems, cough, swelling of legs, bloating, abdominal pain, constipation, diarrhea, urinary frequency, incontinence, hot flashes, joint pain, back pain, gait problems, migraines, numbness, anxiety. Denies appetite changes, fevers, chills, unexplained weight changes. Denies hearing loss, neck lumps or masses, mouth sores or voice changes. Denies wheezing.  Denies shortness of breath. Denies chest pain or palpitations. Denies blood in  stools, nausea, vomiting, or early satiety. Denies pain with intercourse, dysuria, hematuria. Denies pelvic pain, vaginal bleeding or vaginal discharge.   Denies muscle pain/cramps. Denies itching, rash, or wounds. Denies dizziness or seizures. Denies swollen lymph nodes or glands, denies easy bruising or bleeding. Denies depression, confusion, or decreased concentration.  Physical Exam: BP (!) 171/83 Comment: Pt saw PCP last week/Checks at home [facility] notified  Pulse  61   Temp 98.6 F (37 C) (Oral)   Resp 18   Ht 5' 3" (1.6 m)   Wt 185 lb (83.9 kg)   SpO2 100%   BMI 32.77 kg/m  General: Alert, oriented, no acute distress. HEENT: Normocephalic, atraumatic, sclera anicteric. Chest: Clear to auscultation bilaterally.  No wheezes or rhonchi. Cardiovascular: Regular rate and rhythm, no murmurs. Abdomen: Obese, soft, nontender.  Normoactive bowel sounds.  No masses or hepatosplenomegaly appreciated.  Well-healed incisions. Extremities: Grossly normal range of motion.  Warm, well perfused.  1+ edema bilaterally. Skin: No rashes or lesions noted. Lymphatics: No cervical, supraclavicular, or inguinal adenopathy. GU: Normal appearing external genitalia without erythema, excoriation, or lesions.  Speculum exam reveals mildly atrophic vaginal mucosa, radiation changes present.  No bleeding or discharge.  Small urethral carbuncle noted.  Bimanual exam reveals no masses or nodularity.  Rectovaginal exam confirms findings.  Laboratory & Radiologic Studies: None new  Assessment & Plan: Kristin Carlson is a 81 y.o. 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 Completed adjuvant chemotherapy end 01/2022, VBT completed 01/13/22. MMR intact, HER2+. Genetic testing with no pathogenic mutations, VUS in MET, MUTYH, and POLE.   Patient is overall doing well and is NED on exam today.   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  The patient ran out of her Effexor, which is helping with her hot flashes some although not as much as the gabapentin.  I offered to send a new prescription to her mail service pharmacy.  This was done today.   Reviewed signs and symptoms that would be concerning for cancer recurrence and stressed the importance of calling if she develops any of these.  Otherwise, we will continue with visits alternating every 3 months.  Patient sees Dr. Alvy Bimler later this month and Dr. Sondra Come in February.  I will see her back for follow-up in 6 months.  20 minutes of total time was spent for this patient encounter, including preparation, face-to-face counseling with the patient and coordination of care, and documentation of the encounter.  Jeral Pinch, MD  Division of Gynecologic Oncology  Department of Obstetrics and Gynecology  [facility] [facility]

## 2022-10-20 NOTE — Patient Instructions (Signed)
It was good to see you today.  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  I will see you for follow-up in 6 months.  As always, if you develop any new and concerning symptoms before your next visit, please call to see me sooner.   

## 2022-10-27 ENCOUNTER — Ambulatory Visit (HOSPITAL_COMMUNITY)
Admission: RE | Admit: 2022-10-27 | Discharge: 2022-10-27 | Disposition: A | Discharge status: Home / Self Care | Payer: Medicare Other | Source: Ambulatory Visit | Attending: Hematology and Oncology | Admitting: Hematology and Oncology

## 2022-10-27 ENCOUNTER — Inpatient Hospital Stay: Payer: Medicare Other

## 2022-10-27 DIAGNOSIS — C50412 Malignant neoplasm of upper-outer quadrant of left female breast: Secondary | ICD-10-CM | POA: Diagnosis present

## 2022-10-27 DIAGNOSIS — R911 Solitary pulmonary nodule: Secondary | ICD-10-CM | POA: Insufficient documentation

## 2022-10-27 DIAGNOSIS — C541 Malignant neoplasm of endometrium: Secondary | ICD-10-CM

## 2022-10-27 LAB — CMP (CANCER CENTER ONLY)
ALT: 16 U/L (ref 0–44)
AST: 22 U/L (ref 15–41)
Albumin: 4.1 g/dL (ref 3.5–5.0)
Alkaline Phosphatase: 80 U/L (ref 38–126)
Anion gap: 4 — ABNORMAL LOW (ref 5–15)
BUN: 17 mg/dL (ref 8–23)
CO2: 30 mmol/L (ref 22–32)
Calcium: 9.7 mg/dL (ref 8.9–10.3)
Chloride: 106 mmol/L (ref 98–111)
Creatinine: 1.05 mg/dL — ABNORMAL HIGH (ref 0.44–1.00)
GFR, Estimated: 54 mL/min — ABNORMAL LOW (ref >60)
Glucose, Bld: 108 mg/dL — ABNORMAL HIGH (ref 70–99)
Potassium: 4.5 mmol/L (ref 3.5–5.1)
Sodium: 140 mmol/L (ref 135–145)
Total Bilirubin: 0.5 mg/dL (ref 0.3–1.2)
Total Protein: 7 g/dL (ref 6.5–8.1)

## 2022-10-27 LAB — CBC WITH DIFFERENTIAL (CANCER CENTER ONLY)
Abs Immature Granulocytes: 0.01 10*3/uL (ref 0.00–0.07)
Basophils Absolute: 0 10*3/uL (ref 0.0–0.1)
Basophils Relative: 1 %
Eosinophils Absolute: 0.1 10*3/uL (ref 0.0–0.5)
Eosinophils Relative: 3 %
HCT: 39.4 % (ref 36.0–46.0)
Hemoglobin: 13.1 g/dL (ref 12.0–15.0)
Immature Granulocytes: 0 %
Lymphocytes Relative: 33 %
Lymphs Abs: 1.4 10*3/uL (ref 0.7–4.0)
MCH: 30.3 pg (ref 26.0–34.0)
MCHC: 33.2 g/dL (ref 30.0–36.0)
MCV: 91.2 fL (ref 80.0–100.0)
Monocytes Absolute: 0.4 10*3/uL (ref 0.1–1.0)
Monocytes Relative: 10 %
Neutro Abs: 2.3 10*3/uL (ref 1.7–7.7)
Neutrophils Relative %: 53 %
Platelet Count: 177 10*3/uL (ref 150–400)
RBC: 4.32 MIL/uL (ref 3.87–5.11)
RDW: 13.2 % (ref 11.5–15.5)
WBC Count: 4.3 10*3/uL (ref 4.0–10.5)
nRBC: 0 % (ref 0.0–0.2)

## 2022-10-27 MED ORDER — IOHEXOL 300 MG/ML  SOLN
75.0000 mL | Freq: Once | INTRAMUSCULAR | Status: AC | PRN
  Administered 2022-10-27: 75 mL via INTRAVENOUS

## 2022-10-27 MED ORDER — SODIUM CHLORIDE (PF) 0.9 % IJ SOLN
INTRAMUSCULAR | Status: AC
  Filled 2022-10-27: qty 50

## 2022-10-29 ENCOUNTER — Encounter: Payer: Self-pay | Admitting: Hematology and Oncology

## 2022-10-29 ENCOUNTER — Inpatient Hospital Stay (HOSPITAL_BASED_OUTPATIENT_CLINIC_OR_DEPARTMENT_OTHER): Payer: Medicare Other | Admitting: Hematology and Oncology

## 2022-10-29 VITALS — BP 168/87 | HR 64 | Resp 18 | Ht 63.0 in | Wt 189.2 lb

## 2022-10-29 DIAGNOSIS — C50412 Malignant neoplasm of upper-outer quadrant of left female breast: Secondary | ICD-10-CM | POA: Diagnosis not present

## 2022-10-29 DIAGNOSIS — T451X5A Adverse effect of antineoplastic and immunosuppressive drugs, initial encounter: Secondary | ICD-10-CM | POA: Diagnosis not present

## 2022-10-29 DIAGNOSIS — Z8542 Personal history of malignant neoplasm of other parts of uterus: Secondary | ICD-10-CM | POA: Diagnosis not present

## 2022-10-29 DIAGNOSIS — R232 Flushing: Secondary | ICD-10-CM | POA: Diagnosis not present

## 2022-10-29 DIAGNOSIS — C541 Malignant neoplasm of endometrium: Secondary | ICD-10-CM

## 2022-10-29 MED ORDER — GABAPENTIN 100 MG PO CAPS: 100.0000 mg | ORAL_CAPSULE | Freq: Three times a day (TID) | ORAL | 0 refills | Status: DC

## 2022-10-29 NOTE — Progress Notes (Signed)
[place] OFFICE PROGRESS NOTE  Patient Care Team: [name], FNP as PCP - General (Nurse Practitioner)  ASSESSMENT & PLAN:  Breast cancer of upper-outer quadrant of left female breast [facility]) She is tolerating Arimidex well CT imaging of the chest is stable She has intermittent hot flashes She would like to try a lower dose of gabapentin for this and I think is reasonable We reviewed her recent bone density scan and the timing of her next surveillance mammogram  Endometrial cancer [facility]) She has completed adjuvant treatment for this She had serial imaging study done due to concurrent diagnosis of breast cancer I plan to see her again next summer with repeat CT imaging of the chest, abdomen and pelvis for further evaluation She is educated to watch out for signs and symptoms of cancer recurrence  Hot flashes related to aromatase inhibitor therapy We have extensive discussions about management of hot flashes She can take higher dose of venlafaxine but the patient would like to try to go back to lower dose gabapentin which is reasonable I gave her refill for gabapentin 100 mg capsule doses to try  Orders Placed This Encounter  Procedures   CT CHEST ABDOMEN PELVIS W CONTRAST    Standing Status:   Future    Standing Expiration Date:   10/30/2023    Order Specific Question:   Preferred imaging location?    Answer:   [facility]    Order Specific Question:   Radiology Contrast Protocol - do NOT remove file path    Answer:   [email or web address]    All questions were answered. The patient knows to call the clinic with any problems, questions or concerns. The total time spent in the appointment was 30 minutes encounter with patients including review of chart and various tests results, discussions about plan of care and coordination of care plan   Heath Lark, MD 10/29/2022 12:40 PM  INTERVAL HISTORY: Please see below  for problem oriented charting. she returns for surveillance follow-up The patient have history of endometrial cancer and breast cancer We have been following on abnormal imaging study of the chest, results were reviewed, suggest likely benign etiology Since last time I saw her, she have several concerns including hot flashes and trigger fingers as well as back pain as well as knee pain She would like to try lower dose of gabapentin We discussed management of musculoskeletal pain and possible referral to hand surgeon for her trigger fingers  REVIEW OF SYSTEMS:   Constitutional: Denies fevers, chills or abnormal weight loss Eyes: Denies blurriness of vision Ears, nose, mouth, throat, and face: Denies mucositis or sore throat Respiratory: Denies cough, dyspnea or wheezes Cardiovascular: Denies palpitation, chest discomfort or lower extremity swelling Gastrointestinal:  Denies nausea, heartburn or change in bowel habits Skin: Denies abnormal skin rashes Lymphatics: Denies new lymphadenopathy or easy bruising Neurological:Denies numbness, tingling or new weaknesses Behavioral/Psych: Mood is stable, no new changes  All other systems were reviewed with the patient and are negative.  I have reviewed the past medical history, past surgical history, social history and family history with the patient and they are unchanged from previous note.  ALLERGIES:  is allergic to lactose, codeine, penicillins, sulfa antibiotics, and vancomycin.  MEDICATIONS:  Current Outpatient Medications  Medication Sig Dispense Refill   gabapentin (NEURONTIN) 100 MG capsule Take 1 capsule (100 mg total) by mouth 3 (three) times daily. 90 capsule 0   acetaminophen (TYLENOL) 500 MG tablet Take 1,000 mg  by mouth every 6 (six) hours as needed (pain.).     ARIMIDEX 1 MG tablet TAKE 1 TABLET DAILY 90 tablet 3   Cholecalciferol (VITAMIN D3) 50 MCG (2000 UT) TABS Take 2,000 Units by mouth in the morning.     levothyroxine  (SYNTHROID) 125 MCG tablet Take 125 mcg by mouth daily before breakfast.     losartan (COZAAR) 50 MG tablet Take 50 mg by mouth in the morning.     magnesium oxide (MAG-OX) 400 MG tablet Take 400 mg by mouth at bedtime.     Multiple Vitamin (MULTIVITAMIN WITH MINERALS) TABS tablet Take 1 tablet by mouth in the morning.     Omega-3 1000 MG CAPS Take 1,000-2,000 mg by mouth See admin instructions. Take 2 capsules by mouth in the morning & take 1 capsule by mouth at night.     ondansetron (ZOFRAN) 8 MG tablet Take 1 tablet (8 mg total) by mouth every 8 (eight) hours as needed. (Patient taking differently: Take 8 mg by mouth every 8 (eight) hours as needed for nausea or vomiting.) 30 tablet 1   pantoprazole (PROTONIX) 20 MG tablet Take 20 mg by mouth daily as needed for heartburn or indigestion.     prochlorperazine (COMPAZINE) 10 MG tablet Take 10 mg by mouth every 6 (six) hours as needed for nausea or vomiting.     propranolol (INDERAL) 40 MG tablet Take 40 mg by mouth 2 (two) times daily.     rosuvastatin (CRESTOR) 5 MG tablet Take 5 mg by mouth at bedtime.     Simethicone 125 MG TABS Take 250 mg by mouth 2 (two) times daily as needed (gas).     temazepam (RESTORIL) 15 MG capsule Take 15 mg by mouth at bedtime as needed for sleep.     venlafaxine XR (EFFEXOR-XR) 37.5 MG 24 hr capsule Take 1 capsule (37.5 mg total) by mouth daily with breakfast. 60 capsule 6   No current facility-administered medications for this visit.    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 Evaluation of the pelvis is significantly limited by dense metallic streak artifact from adjacent hip arthroplasty. Within this limitation, there is expansile,  masslike, heterogeneously enhancing appearance of the endometrium, measuring at least 5.2 cm in thickness. Findings are in keeping with reported diagnosis of endometrial malignancy. 2. No evidence of lymphadenopathy or metastatic disease in the chest, abdomen, or pelvis. 3. There is a 0.4 cm fissural nodule the superior segment left lower lobe, almost certainly a benign intrapulmonary lymph node. Attention on follow-up. 4. Coronary artery disease.   10/14/2021 Surgery   TRH/BSO, right SLN biopsy, left pelvic and PA LND, peritoneal nodule biopsy, mini-lap for specimen delivery  Findings: On EUA, 8cm bulbous uterus. ON intra-abdominal entry, normal upper abdominal survey. Normal omentum, small and large bowel. Uterus 8-10cm and bulbous. Normal appearing adnexa. Mapping successful on the right, no mapping on the left. Some mildly prominent lymph nodes bilaterally in the pelvis. Small, <5 mm, nodule in the cul de sac. No obvious intra-abdominal or pelvic evidence of disease.   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 SENTINEL LYMPH NODE, RIGHT EXTERNAL ILIAC, BIOPSY:  - Lymph node, negative for carcinoma (0/1)   B. SENTINEL LYMPH NODE, RIGHT OBTURATOR AND SURROUNDING LYMPH NODES,  BIOPSY:  - Lymph nodes, negative for carcinoma (0/5)   C. UTERUS, CERVIX, BILATERAL FALLOPIAN TUBES AND OVARIES:  - Invasive high-grade serous carcinoma  - Carcinoma focally invades into the serosal surface  - Lymphovascular invasion is present  - Benign unremarkable cervix  - Benign unremarkable bilateral fallopian tubes and ovaries  - See oncology table   D. CUL DE SAC NODULE, POSTERIOR, BIOPSY:  - Endometriosis   E. LYMPH NODE, LEFT PELVIC, BIOPSY:  - Lymph nodes,  negative for carcinoma (0/3)   F. LYMPH NODE, LEFT PARAORTIC, BIOPSY:  - Lymph nodes, negative for carcinoma (0/5)   ONCOLOGY TABLE:   UTERUS, CARCINOMA OR CARCINOSARCOMA: Resection   Procedure: Total hysterectomy and bilateral salpingo-oophorectomy  Histologic Type: Serous carcinoma  Histologic Grade: High-grade  Myometrial Invasion:       Depth of Myometrial Invasion (mm): 32 mm       Myometrial Thickness (mm): 32 mm       Percentage of Myometrial Invasion: 100%  Uterine Serosa Involvement: Present, focal  Cervical stromal Involvement: Not identified  Extent of involvement of other tissue/organs: Not identified  Peritoneal/Ascitic Fluid: Negative for carcinoma  Lymphovascular Invasion: Present  Regional Lymph Nodes:       Pelvic Lymph Nodes Examined:                                   6 Sentinel                                   3 Non-sentinel                                   9 Total       Pelvic Lymph Nodes with Metastasis: 0                           Macrometastasis: (>2.0 mm): 0                           Micrometastasis: (>0.2 mm and < 2.0 mm): 0                           Isolated Tumor Cells (<0.2 mm): 0                           Laterality of Lymph Node with Tumor: Not  applicable                           Extracapsular Extension: Not applicable       Para-aortic Lymph Nodes Examined:                                    0 Sentinel  5 Non-sentinel                                    5 Total       Para-aortic Lymph Nodes with Metastasis: 0                           Macrometastasis: (>2.0 mm): 0                           Micrometastasis:  (>0.2 mm and < 2.0 mm): 0                           Isolated Tumor Cells (<0.2 mm): 0                           Laterality of Lymph Node with Tumor: Not  applicable                           Extracapsular Extension: Not applicable  Distant Metastasis:       Distant Site(s) Involved: Not applicable   Pathologic Stage Classification (pTNM, AJCC 8th Edition): pT3a, pN0  Ancillary Studies: MMR / MSI testing will be ordered  Representative Tumor Block: C4  Comment(s): Pancytokeratin was performed on the lymph nodes and is negative.   By immunohistochemistry, HER-2 is EQUIVOCAL (2+).  HER-2 by FISH is pending and will be reported in an addendum.   FLOURESCENCE IN-SITU HYBRIDIZATION RESULTS:   GROUP 1:  HER2 **POSITIVE**   On the tissue sample received from this individual HER2 FISH was performed by a technologist and cell imaging and analysis on the [employer].   RATIO of HER2/CEN 17 SIGNALS: 2.50  AVERAGE HER2 COPY NUMBER PER CELL: 4.74   The ratio of HER2/CEN 17 result exceeds the cutoff value of >=2.0 and a copy number of HER2 signals exceeding the cutoff range of >=4.0 signals per cell.  Arch Pathol Lab Med 1:1, 2018.    10/28/2021 Cancer Staging   Staging form: Corpus Uteri - Carcinoma and Carcinosarcoma, AJCC 8th Edition - Pathologic stage from 10/28/2021: Stage III (pT3, pN0, cM0) - Signed by Heath Lark, MD on 10/28/2021 Stage prefix: Initial diagnosis   11/07/2021 Procedure   Successful placement of a right internal jugular approach power injectable Port-A-Cath. The catheter is ready for immediate use.   11/11/2021 - 02/26/2022 Chemotherapy   Patient is on Treatment Plan : UTERINE Carboplatin AUC 6 / Paclitaxel q21d      Genetic Testing   Negative genetic testing. No pathogenic variants identified on the [facility] Multi-Cancer+RNA panel. VUS in MET called c.3220A>G, VUS in MUTYh called c.1417G>A and VUS in POLE called c.596G>T identified. The report date is 01/20/2022.  The Multi-Cancer Panel + RNA offered by [facility] includes sequencing and/or deletion duplication testing of the following 84 genes: AIP, ALK, APC, ATM, AXIN2,BAP1,  BARD1, BLM, BMPR1A, BRCA1, BRCA2, BRIP1, CASR, CDC73, CDH1, CDK4, CDKN1B, CDKN1C, CDKN2A (p14ARF), CDKN2A (p16INK4a), CEBPA, CHEK2, CTNNA1, DICER1,  DIS3L2, EGFR (c.2369C>T, p.Thr790Met variant only), EPCAM (Deletion/duplication testing only), FH, FLCN, GATA2, GPC3, GREM1 (Promoter region deletion/duplication testing only), HOXB13 (c.251G>A, p.Gly84Glu), HRAS, KIT, MAX, MEN1, MET, MITF (c.952G>A, p.Glu318Lys variant only), MLH1, MSH2, MSH3, MSH6, MUTYH, NBN, NF1, NF2, NTHL1, PALB2, PDGFRA, PHOX2B, PMS2, POLD1,  POLE, POT1, PRKAR1A, PTCH1, PTEN, RAD50, RAD51C, RAD51D, RB1, RECQL4, RET, RUNX1, SDHAF2, SDHA (sequence changes only), SDHB, SDHC, SDHD, SMAD4, SMARCA4, SMARCB1, SMARCE1, STK11, SUFU, TERC, TERT, TMEM127, TP53, TSC1, TSC2, VHL, WRN and WT1.   03/30/2022 Imaging   1. Bandlike opacity in the medial left lung apex is progressive in the interval, now with nodular component measuring up to 10 mm. While likely related to evolving atelectasis or scar, consider follow-up CT chest in 3 months to reassess. 2. Interval hysterectomy. 3. No evidence for metastatic disease in the chest, abdomen, or pelvis. 4. Trace free fluid in the upper pelvis, nonspecific. Of note, inferior pelvis is essentially obscured by beam hardening artifact from bilateral hip replacement. 5. Left colonic diverticulosis without diverticulitis. 6. Moderate stool volume. Imaging features could be compatible with constipation in the appropriate clinical setting. 7. Aortic Atherosclerosis (ICD10-I70.0).   06/26/2022 Imaging   1. Increasing conspicuity and size of a previously bandlike and now nodular area of density in the LEFT upper lobe suspicious for metastatic process. PET scan and or biopsy could be helpful for further evaluation. 2. Stable small LEFT retropectoral lymph nodes. These warrant attention on follow-up imaging based on patient history. 3. Added density throughout the LEFT breast associated with surgical clips presumably postoperative and or post treatment related. 4. Aortic atherosclerosis.   Aortic Atherosclerosis (ICD10-I70.0).   10/29/2022 Imaging   1. The  previously biopsied left upper lobe nodule is not significantly changed in size. There is a new fiducial marker along the inferior aspect of this nodule and a linear tail projecting posteriorly which may reflect a mucous impacted bronchus. Consider continued CT follow-up as clinically warranted. 2. No new or enlarging pulmonary nodules. No definite evidence of metastatic disease. 3. Interval resolution of previously demonstrated asymmetric increased density in the left breast, likely postsurgical. 4. Aortic Atherosclerosis (ICD10-I70.0) and Emphysema (ICD10-J43.9).     Breast cancer of upper-outer quadrant of left female breast ([place])  11/19/2021 Imaging   IMPRESSION: 1. Highly suspicious mass in the left breast at 1 o'clock measuring 1.9 cm.   2.  No mammographic evidence of malignancy in the right breast   11/27/2021 Procedure   Appropriate positioning of the venous shaped biopsy marking clip at the site of biopsy in the 1 o'clock location of the LEFT breast.   12/02/2021 Initial Diagnosis   Breast cancer of upper-outer quadrant of left female breast ([place])   12/02/2021 Cancer Staging   Staging form: Breast, AJCC 8th Edition - Clinical stage from 12/02/2021: cT1c, cN0, cM0 - Signed by Heath Lark, MD on 12/02/2021 Stage prefix: Initial diagnosis    Genetic Testing   Negative genetic testing. No pathogenic variants identified on the [facility] Multi-Cancer+RNA panel. VUS in MET called c.3220A>G, VUS in MUTYh called c.1417G>A and VUS in POLE called c.596G>T identified. The report date is 01/20/2022.  The Multi-Cancer Panel + RNA offered by [facility] includes sequencing and/or deletion duplication testing of the following 84 genes: AIP, ALK, APC, ATM, AXIN2,BAP1,  BARD1, BLM, BMPR1A, BRCA1, BRCA2, BRIP1, CASR, CDC73, CDH1, CDK4, CDKN1B, CDKN1C, CDKN2A (p14ARF), CDKN2A (p16INK4a), CEBPA, CHEK2, CTNNA1, DICER1, DIS3L2, EGFR (c.2369C>T, p.Thr790Met variant only), EPCAM (Deletion/duplication testing only), FH,  FLCN, GATA2, GPC3, GREM1 (Promoter region deletion/duplication testing only), HOXB13 (c.251G>A, p.Gly84Glu), HRAS, KIT, MAX, MEN1, MET, MITF (c.952G>A, p.Glu318Lys variant only), MLH1, MSH2, MSH3, MSH6, MUTYH, NBN, NF1, NF2, NTHL1, PALB2, PDGFRA, PHOX2B, PMS2, POLD1, POLE, POT1, PRKAR1A, PTCH1, PTEN, RAD50, RAD51C, RAD51D, RB1, RECQL4, RET, RUNX1, SDHAF2, SDHA (sequence changes only), SDHB, SDHC,  SDHD, SMAD4, SMARCA4, SMARCB1, SMARCE1, STK11, SUFU, TERC, TERT, TMEM127, TP53, TSC1, TSC2, VHL, WRN and WT1.   02/23/2022 Imaging   .WHO classification is NORMAL BONE MINERAL DENSITY.    03/30/2022 -  Anti-estrogen oral therapy   She is started on Arimidex   06/16/2022 Pathology Results   FINAL MICROSCOPIC DIAGNOSIS:   A. BREAST, LEFT, LUMPECTOMY:  -  Invasive mammary carcinoma (NST)/invasive ductal carcinoma (NOS) [retained membranous e-cadherin positivity], 1.7 cm in greatest dimension, Nottingham histologic score 2 out of 3.  -  Ductal carcinoma in situ (DCIS), cribriform type, nuclear grade 2 of 3.  -  Biomarkers, performed on the biopsy specimen, ER positive (90% strong), PR positive (11 to 50% moderate), HER2/neu negative  -  Margins negative in conjunction with parts a B and C (closest margin anterior at 5 mm and superior at 9 mm all others greater than 10 mm).  pT1c pNX pM n/a   B. BREAST, LEFT POSTERIOR MARGIN, EXCISION:  -  Benign breast tissue, negative for malignancy (new margin 1.5 cm)   C. BREAST, LEFT MEDIAL MARGIN, EXCISION:  -  Benign breast tissue, negative for malignancy (new margin 1.5 cm)   ONCOLOGY TABLE:   INVASIVE CARCINOMA OF THE BREAST:  Resection   Procedure: Lumpectomy  Specimen Laterality: Left  Histologic Type: Invasive mammary carcinoma of no special type  (NST)/invasive ductal carcinoma (NOS)  Histologic Grade:       Glandular (Acinar)/Tubular Differentiation: 3       Nuclear Pleomorphism: 2       Mitotic Rate: 1       Overall Grade: 2  Tumor Size: 1.7 x 1.5  x 1.0 cm  Ductal Carcinoma In Situ: Cribriform type, nuclear grade 2 of 3  Tumor Extent: Limited to breast parenchyma  Treatment Effect in the Breast: It is noted the patient has received  chemotherapy for endometrial carcinoma as well as antrum aromatase  inhibitor without morphologic evidence of tumor response  Margins: All margins negative for invasive carcinoma       Distance from Closest Margin (mm): 5 mm and 9 mm       Specify Closest Margin (required only if <16m): Anterior (5) and  Superior (9)  DCIS Margins: Uninvolved by DCIS       Distance from Closest Margin (mm): 5 mm       Specify Closest Margin (required only if <133m: Posterior  Regional Lymph Nodes: Not applicable (no lymph nodes submitted or found)       Number of Lymph Nodes Examined: 0       Number of Sentinel Nodes Examined: 0       Number of Lymph Nodes with Macrometastases (>2 mm): N/A       Number of Lymph Nodes with Micrometastases: N/A       Number of Lymph Nodes with Isolated Tumor Cells (=0.2 mm or =200  cells): N/A       Size of Largest Metastatic Deposit (mm): N/A       Extranodal Extension: N/A  Distant Metastasis:       Distant Site(s) Involved: N/A  Breast Biomarker Testing Performed on Previous Biopsy:       Testing Performed on Case Number: [id number]             Estrogen Receptor: Positive, >90% strong             Progesterone Receptor: Positive, 11 to 50% moderate  HER2: IHC 0/3; negative             Ki-67: Not performed  Pathologic Stage Classification (pTNM, AJCC 8th Edition): pT1c, pNx  Representative Tumor Block: A5  Comment(s): [None]    06/16/2022 Surgery   Preoperative diagnosis: Left breast cancer, clinical stage I  Procedure: Left breast radioactive seed guided lumpectomy Surgeon: Dr. Serita Grammes   07/09/2022 PET scan   1. Hypermetabolic apical left upper lobe nodule, worrisome for primary bronchogenic carcinoma. Metastatic disease is also possibility. 2. Mild  residual hypermetabolism associated with the left breast, likely postoperative/post treatment in etiology. Difficult to exclude residual disease. 3. Punctate left renal stone.  4. Aortic atherosclerosis (ICD10-I70.0). coronary artery calcification.   07/20/2022 Pathology Results   A. LUNG, LEFT UPPER LOBE, NEEDLE CORE BIOPSY:  - Necrotizing granulomatous inflammation.  See comment.   COMMENT:   AFB, PAS-F, and GMS special stains are negative for organisms.      PHYSICAL EXAMINATION: ECOG PERFORMANCE STATUS: 1 - Symptomatic but completely ambulatory  Vitals:   10/29/22 1026  BP: (!) 168/87  Pulse: 64  Resp: 18  SpO2: 99%   Filed Weights   10/29/22 1026  Weight: 189 lb 3.2 oz (85.8 kg)    GENERAL:alert, no distress and comfortable  NEURO: alert & oriented x 3 with fluent speech, no focal motor/sensory deficits  LABORATORY DATA:  I have reviewed the data as listed    Component Value Date/Time   NA 140 10/27/2022 1051   K 4.5 10/27/2022 1051   CL 106 10/27/2022 1051   CO2 30 10/27/2022 1051   GLUCOSE 108 (H) 10/27/2022 1051   BUN 17 10/27/2022 1051   CREATININE 1.05 (H) 10/27/2022 1051   CALCIUM 9.7 10/27/2022 1051   PROT 7.0 10/27/2022 1051   ALBUMIN 4.1 10/27/2022 1051   AST 22 10/27/2022 1051   ALT 16 10/27/2022 1051   ALKPHOS 80 10/27/2022 1051   BILITOT 0.5 10/27/2022 1051   GFRNONAA 54 (L) 10/27/2022 1051    No results found for: "SPEP", "UPEP"  Lab Results  Component Value Date   WBC 4.3 10/27/2022   NEUTROABS 2.3 10/27/2022   HGB 13.1 10/27/2022   HCT 39.4 10/27/2022   MCV 91.2 10/27/2022   PLT 177 10/27/2022      Chemistry      Component Value Date/Time   NA 140 10/27/2022 1051   K 4.5 10/27/2022 1051   CL 106 10/27/2022 1051   CO2 30 10/27/2022 1051   BUN 17 10/27/2022 1051   CREATININE 1.05 (H) 10/27/2022 1051      Component Value Date/Time   CALCIUM 9.7 10/27/2022 1051   ALKPHOS 80 10/27/2022 1051   AST 22 10/27/2022 1051   ALT 16  10/27/2022 1051   BILITOT 0.5 10/27/2022 1051       RADIOGRAPHIC STUDIES: I have personally reviewed the radiological images as listed and agreed with the findings in the report. CT CHEST W CONTRAST  Result Date: 10/28/2022 CLINICAL DATA:  Follow up pulmonary nodule. History of breast and uterine cancer. 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 * Tracking Code: BO * EXAM: CT CHEST WITH CONTRAST TECHNIQUE: Multidetector CT imaging of the chest was performed during intravenous contrast administration. RADIATION DOSE REDUCTION: This exam was performed according to the departmental dose-optimization program which includes automated exposure control, adjustment of the mA and/or kV according to patient size and/or use of iterative reconstruction technique. CONTRAST:  59m OMNIPAQUE IOHEXOL 300 MG/ML  SOLN COMPARISON:  PET-CT 07/08/2022.  Chest CT 06/26/2022 and 03/27/2022. FINDINGS: Cardiovascular: Atherosclerosis of the aorta, great vessels and coronary arteries again noted. No acute vascular findings. The heart size is normal. There is no pericardial effusion. Mediastinum/Nodes: There are no enlarged mediastinal, hilar, axillary or internal mammary lymph nodes. Suspected previous thyroidectomy. Stable small hiatal hernia. Lungs/Pleura: No pleural effusion or pneumothorax. There is mild centrilobular emphysema with stable biapical scarring. There is a new fiducial marker along the inferior aspect of the biopsied nodule at the left lung apex. This nodule currently measures 1.2 x 1.1 cm on image 26/7 (previously 1.1 x 1.1 cm). There is a new linear tail projecting posteriorly from this nodule which may reflect a mucous impacted bronchus. No new or enlarging pulmonary nodules. Stable mild linear scarring at both lung bases. Upper abdomen: The visualized upper abdomen appears stable, without significant findings. Musculoskeletal/Chest wall: There is  no chest wall mass or suspicious osseous finding. The previously demonstrated asymmetric increased density within the left breast has resolved and may have been postsurgical in this patient who underwent lumpectomy 06/16/2022. Surgical clips are present in the left breast. IMPRESSION: 1. The previously biopsied left upper lobe nodule is not significantly changed in size. There is a new fiducial marker along the inferior aspect of this nodule and a linear tail projecting posteriorly which may reflect a mucous impacted bronchus. Consider continued CT follow-up as clinically warranted. 2. No new or enlarging pulmonary nodules. No definite evidence of metastatic disease. 3. Interval resolution of previously demonstrated asymmetric increased density in the left breast, likely postsurgical. 4. Aortic Atherosclerosis (ICD10-I70.0) and Emphysema (ICD10-J43.9). Electronically Signed   By: Richardean Sale M.D.   On: 10/28/2022 11:27

## 2022-10-29 NOTE — Assessment & Plan Note (Signed)
We have extensive discussions about management of hot flashes She can take higher dose of venlafaxine but the patient would like to try to go back to lower dose gabapentin which is reasonable I gave her refill for gabapentin 100 mg capsule doses to try

## 2022-10-29 NOTE — Assessment & Plan Note (Signed)
She is tolerating Arimidex well CT imaging of the chest is stable She has intermittent hot flashes She would like to try a lower dose of gabapentin for this and I think is reasonable We reviewed her recent bone density scan and the timing of her next surveillance mammogram

## 2022-10-29 NOTE — Assessment & Plan Note (Signed)
She has completed adjuvant treatment for this She had serial imaging study done due to concurrent diagnosis of breast cancer I plan to see her again next summer with repeat CT imaging of the chest, abdomen and pelvis for further evaluation She is educated to watch out for signs and symptoms of cancer recurrence

## 2022-11-02 ENCOUNTER — Telehealth: Payer: Self-pay | Admitting: Hematology and Oncology

## 2022-11-02 NOTE — Telephone Encounter (Signed)
Scheduled appointment per 11/30 los. Patient is aware.

## 2022-12-03 ENCOUNTER — Encounter: Payer: Self-pay | Admitting: Dermatology

## 2022-12-03 ENCOUNTER — Ambulatory Visit: Payer: TRICARE For Life (TFL) | Admitting: Dermatology

## 2022-12-03 VITALS — BP 168/87 | HR 59

## 2022-12-03 DIAGNOSIS — L814 Other melanin hyperpigmentation: Secondary | ICD-10-CM

## 2022-12-03 DIAGNOSIS — D489 Neoplasm of uncertain behavior, unspecified: Secondary | ICD-10-CM

## 2022-12-03 DIAGNOSIS — D492 Neoplasm of unspecified behavior of bone, soft tissue, and skin: Secondary | ICD-10-CM

## 2022-12-03 DIAGNOSIS — Z85828 Personal history of other malignant neoplasm of skin: Secondary | ICD-10-CM | POA: Diagnosis not present

## 2022-12-03 DIAGNOSIS — Z1283 Encounter for screening for malignant neoplasm of skin: Secondary | ICD-10-CM

## 2022-12-03 DIAGNOSIS — D485 Neoplasm of uncertain behavior of skin: Secondary | ICD-10-CM

## 2022-12-03 DIAGNOSIS — C44311 Basal cell carcinoma of skin of nose: Secondary | ICD-10-CM | POA: Diagnosis not present

## 2022-12-03 DIAGNOSIS — C4491 Basal cell carcinoma of skin, unspecified: Secondary | ICD-10-CM

## 2022-12-03 DIAGNOSIS — L821 Other seborrheic keratosis: Secondary | ICD-10-CM

## 2022-12-03 DIAGNOSIS — C44612 Basal cell carcinoma of skin of right upper limb, including shoulder: Secondary | ICD-10-CM

## 2022-12-03 HISTORY — DX: Basal cell carcinoma of skin, unspecified: C44.91

## 2022-12-03 NOTE — Progress Notes (Addendum)
Follow-Up Visit   Subjective  Kristin Carlson is a 82 y.o. female who presents for the following: Annual Exam (Hx of BCC's, Hx of AKs) and Skin Cancer (Hx of Atypical Basaloid Cells at left nasal ala, Bx. 07/29/2021. Was not treated with Mohs surgery. 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  The patient presents for Total-Body Skin Exam (TBSE) for skin cancer screening and mole check.  The patient has spots, moles and lesions to be evaluated, some may be new or changing and the patient has concerns that these could be cancer.  The following portions of the chart were reviewed this encounter and updated as appropriate:  Tobacco  Allergies  Meds  Problems  Med Hx  Surg Hx  Fam Hx      Review of Systems: No other skin or systemic complaints except as noted in HPI or Assessment and Plan.   Objective  Well appearing patient in no apparent distress; mood and affect are within normal limits.  A full examination was performed including scalp, head, eyes, ears, nose, lips, neck, chest, axillae, abdomen, back, buttocks, bilateral upper extremities, bilateral lower extremities, hands, feet, fingers, toes, fingernails, and toenails. All findings within normal limits unless otherwise noted below.  left nasal ala Pink papule  Right Forearm Ventral 1.0 cm irregular brown macule        Right Superior Shoulder 0.9 cm think pink papule     Right lateral Cheek Pink papule      Assessment & Plan   History of Basal Cell Carcinoma of the Skin - No evidence of recurrence today - Recommend regular full body skin exams - Recommend daily broad spectrum sunscreen SPF 30+ to sun-exposed areas, reapply every 2 hours as needed.  - Call if any new or changing lesions are noted between office visits  Lentigines - Scattered tan macules - Due to sun exposure - Benign-appearing, observe - Recommend daily broad spectrum sunscreen  SPF 30+ to sun-exposed areas, reapply every 2 hours as needed. - Call for any changes  Seborrheic Keratoses - Stuck-on, waxy, tan-brown papules and/or plaques  - Benign-appearing - Discussed benign etiology and prognosis. - Observe - Call for any changes  Melanocytic Nevi - Tan-brown and/or pink-flesh-colored symmetric macules and papules - Benign appearing on exam today - Observation - Call clinic for new or changing moles - Recommend daily use of broad spectrum spf 30+ sunscreen to sun-exposed areas.   Hemangiomas - Red papules - Discussed benign nature - Observe - Call for any changes  Actinic Damage - Chronic condition, secondary to cumulative UV/sun exposure - diffuse scaly erythematous macules with underlying dyspigmentation - Recommend daily broad spectrum sunscreen SPF 30+ to sun-exposed areas, reapply every 2 hours as needed.  - Staying in the shade or wearing long sleeves, sun glasses (UVA+UVB protection) and wide brim hats (4-inch brim around the entire circumference of the hat) are also recommended for sun protection.  - Call for new or changing lesions.  Skin cancer screening performed today.  Basal cell carcinoma (BCC) of skin of nose left nasal ala  Patient defers treatment today. Husband is terminally ill with cirrhosis and she is unsure how long he has. She will touch base in a few months.  Discussed treatment options of EDC, Mohs, and radiation and that cancer can continue to grow when not treated and eat into the surrounding tissues including cartilage and bone, sometimes growing into the skull.   ED&C has about  an 85% cure rate and leaves a round wound the size of the skin cancer which is healed with ointment and a bandage over a few weeks time. It leaves a round white scar. No additional pathology is done. If the skin cancer were to come back, we would need to do a surgery to remove it.   Mohs involves cutting out right around the spot and then checking  under the microscope to be sure the whole skin cancer is out. The cure rate is about 98-99%. It is done at another office outside of [place] ([place], [place], or [place]). Once the Mohs surgeon confirms the skin cancer is out, they will discuss the options to repair or heal the area. You must take it easy for about two weeks after surgery (no lifting over 10-15 lbs, avoid activity to get your heart rate and blood pressure up).    Neoplasm of skin (2) Right Forearm Ventral  Epidermal / dermal shaving  Lesion diameter (cm):  1 Informed consent: discussed and consent obtained   Patient was prepped and draped in usual sterile fashion: Area prepped with alcohol. Anesthesia: the lesion was anesthetized in a standard fashion   Anesthetic:  1% lidocaine w/ epinephrine 1-100,000 buffered w/ 8.4% NaHCO3 Instrument used: flexible razor blade   Hemostasis achieved with: pressure, aluminum chloride and electrodesiccation   Outcome: patient tolerated procedure well   Post-procedure details: wound care instructions given   Post-procedure details comment:  Ointment and small bandage applied  Specimen 1 - Surgical pathology Differential Diagnosis: R/O atypia vs macular SK  Check Margins: No  Right Superior Shoulder  Skin / nail biopsy Type of biopsy: tangential   Informed consent: discussed and consent obtained   Anesthesia: the lesion was anesthetized in a standard fashion   Anesthesia comment:  Area prepped with alcohol Anesthetic:  1% lidocaine w/ epinephrine 1-100,000 buffered w/ 8.4% NaHCO3 Instrument used: flexible razor blade   Hemostasis achieved with: pressure, aluminum chloride and electrodesiccation   Outcome: patient tolerated procedure well   Post-procedure details: wound care instructions given   Post-procedure details comment:  Ointment and small bandage applied  Specimen 2 - Surgical pathology Differential Diagnosis: R/O BCC >> amelanotic melanoma  Check Margins:  No  Neoplasm of uncertain behavior Right lateral Cheek  Nevus vs Cyst vs BCC. Discussed this could be a BCC and could grow.  Patient defers biopsy today. Will recheck and evaluate at follow up appointment.    Return in about 3 months (around 03/04/2023) for face recheck, 6 months TBSE.  I, Lawson Radar, CMA, am acting as scribe for Darden Dates, MD.  Documentation: I have reviewed the above documentation for accuracy and completeness, and I agree with the above.  Darden Dates, MD

## 2022-12-03 NOTE — Patient Instructions (Addendum)
Wound Care Instructions  Cleanse wound gently with soap and water once a day then pat dry with clean gauze. Apply a thin coat of Petrolatum (petroleum jelly, "Vaseline") over the wound (unless you have an allergy to this). We recommend that you use a new, sterile tube of Vaseline. Do not pick or remove scabs. Do not remove the yellow or white "healing tissue" from the base of the wound.  Cover the wound with fresh, clean, nonstick gauze and secure with paper tape. You may use Band-Aids in place of gauze and tape if the wound is small enough, but would recommend trimming much of the tape off as there is often too much. Sometimes Band-Aids can irritate the skin.  You should call the office for your biopsy report after 1 week if you have not already been contacted.  If you experience any problems, such as abnormal amounts of bleeding, swelling, significant bruising, significant pain, or evidence of infection, please call the office immediately.  FOR ADULT SURGERY PATIENTS: If you need something for pain relief you may take 1 extra strength Tylenol (acetaminophen) AND 2 Ibuprofen ('200mg'$  each) together every 4 hours as needed for pain. (do not take these if you are allergic to them or if you have a reason you should not take them.) Typically, you may only need pain medication for 1 to 3 days.   Recommend Lachydrin/AmLactin, Vaseline Jelly or Aquaphor for very dry skin.   Recommend daily broad spectrum sunscreen SPF 30+ to sun-exposed areas, reapply every 2 hours as needed. Call for new or changing lesions.  Staying in the shade or wearing long sleeves, sun glasses (UVA+UVB protection) and wide brim hats (4-inch brim around the entire circumference of the hat) are also recommended for sun protection.    Recommend taking Heliocare sun protection supplement daily in sunny weather for additional sun protection. For maximum protection on the sunniest days, you can take up to 2 capsules of regular Heliocare  OR take 1 capsule of Heliocare Ultra. For prolonged exposure (such as a full day in the sun), you can repeat your dose of the supplement 4 hours after your first dose. Heliocare can be purchased at [employer], at some [facility] or at [email or web address].    Melanoma ABCDEs  Melanoma is the most dangerous type of skin cancer, and is the leading cause of death from skin disease.  You are more likely to develop melanoma if you: Have light-colored skin, light-colored eyes, or red or blond hair Spend a lot of time in the sun Tan regularly, either outdoors or in a tanning bed Have had blistering sunburns, especially during childhood Have a close family member who has had a melanoma Have atypical moles or large birthmarks  Early detection of melanoma is key since treatment is typically straightforward and cure rates are extremely high if we catch it early.   The first sign of melanoma is often a change in a mole or a new dark spot.  The ABCDE system is a way of remembering the signs of melanoma.  A for asymmetry:  The two halves do not match. B for border:  The edges of the growth are irregular. C for color:  A mixture of colors are present instead of an even brown color. D for diameter:  Melanomas are usually (but not always) greater than 88m - the size of a pencil eraser. E for evolution:  The spot keeps changing in size, shape, and color.  Please check your skin once  per month between visits. You can use a small mirror in front and a large mirror behind you to keep an eye on the back side or your body.   If you see any new or changing lesions before your next follow-up, please call to schedule a visit.  Please continue daily skin protection including broad spectrum sunscreen SPF 30+ to sun-exposed areas, reapplying every 2 hours as needed when you're outdoors.   Staying in the shade or wearing long sleeves, sun glasses (UVA+UVB protection) and wide brim hats (4-inch brim around the  entire circumference of the hat) are also recommended for sun protection.    Due to recent changes in healthcare laws, you may see results of your pathology and/or laboratory studies on MyChart before the doctors have had a chance to review them. We understand that in some cases there may be results that are confusing or concerning to you. Please understand that not all results are received at the same time and often the doctors may need to interpret multiple results in order to provide you with the best plan of care or course of treatment. Therefore, we ask that you please give [place] 2 business days to thoroughly review all your results before contacting the office for clarification. Should we see a critical lab result, you will be contacted sooner.   If You Need Anything After Your Visit  If you have any questions or concerns for your doctor, please call our main line at [phone] and press option 4 to reach your doctor's medical assistant. If no one answers, please leave a voicemail as directed and we will return your call as soon as possible. Messages left after 4 pm will be answered the following business day.   You may also send [place] a message via [place]. We typically respond to MyChart messages within 1-2 business days.  For prescription refills, please ask your pharmacy to contact our office. Our fax number is [phone].  If you have an urgent issue when the clinic is closed that cannot wait until the next business day, you can page your doctor at the number below.    Please note that while we do our best to be available for urgent issues outside of office hours, we are not available 24/7.   If you have an urgent issue and are unable to reach [place], you may choose to seek medical care at your doctor's office, retail clinic, urgent care center, or emergency room.  If you have a medical emergency, please immediately call 911 or go to the emergency department.  Pager Numbers  - Dr.  Nehemiah Massed: [phone]  - Dr. Laurence Ferrari: [phone]  - Dr. Nicole Kindred: [phone]  In the event of inclement weather, please call our main line at [phone] for an update on the status of any delays or closures.  Dermatology Medication Tips: Please keep the boxes that topical medications come in in order to help keep track of the instructions about where and how to use these. Pharmacies typically print the medication instructions only on the boxes and not directly on the medication tubes.   If your medication is too expensive, please contact our office at [phone] option 4 or send [place] a message through [place].   We are unable to tell what your co-pay for medications will be in advance as this is different depending on your insurance coverage. However, we may be able to find a substitute medication at lower cost or fill out paperwork to get insurance to cover  a needed medication.   If a prior authorization is required to get your medication covered by your insurance company, please allow [place] 1-2 business days to complete this process.  Drug prices often vary depending on where the prescription is filled and some pharmacies may offer cheaper prices.  The website www.goodrx.com contains coupons for medications through different pharmacies. The prices here do not account for what the cost may be with help from insurance (it may be cheaper with your insurance), but the website can give you the price if you did not use any insurance.  - You can print the associated coupon and take it with your prescription to the pharmacy.  - You may also stop by our office during regular business hours and pick up a GoodRx coupon card.  - If you need your prescription sent electronically to a different pharmacy, notify our office through [facility] or by phone at [phone] option 4.     Si Usted Necesita Algo Despus de Su Visita  Tambin puede enviarnos un mensaje a travs de Pharmacist, community. Por lo  general respondemos a los mensajes de MyChart en el transcurso de 1 a 2 das hbiles.  Para renovar recetas, por favor pida a su farmacia que se ponga en contacto con nuestra oficina. Harland Dingwall de fax es [place] [phone].  Si tiene un asunto urgente cuando la clnica est cerrada y que no puede esperar hasta el siguiente da hbil, puede llamar/localizar a su doctor(a) al nmero que aparece a continuacin.   Por favor, tenga en cuenta que aunque hacemos todo lo posible para estar disponibles para asuntos urgentes fuera del horario de [place], no estamos disponibles las 24 horas del da, los 7 das de la [place].   Si tiene un problema urgente y no puede comunicarse con nosotros, puede optar por buscar atencin mdica  en el consultorio de su doctor(a), en una clnica privada, en un centro de atencin urgente o en una sala de emergencias.  Si tiene Engineering geologist, por favor llame inmediatamente al 911 o vaya a la sala de emergencias.  Nmeros de bper  - Dr. Nehemiah Massed: [phone]  - Dra. Moye: [phone]  - Dra. Nicole Kindred: [phone]  En caso de inclemencias del [place], por favor llame a Johnsie Kindred principal al [phone] para una actualizacin sobre el [place] de cualquier retraso o cierre.  Consejos para la medicacin en dermatologa: Por favor, guarde las cajas en las que vienen los medicamentos de uso tpico para ayudarle a seguir las instrucciones sobre dnde y cmo usarlos. Las farmacias generalmente imprimen las instrucciones del medicamento slo en las cajas y no directamente en los tubos del [place].   Si su medicamento es muy caro, por favor, pngase en contacto con Zigmund Daniel llamando al [phone] y presione la opcin 4 o envenos un mensaje a travs de Pharmacist, community.   No podemos decirle cul ser su copago por los medicamentos por adelantado ya que esto es diferente dependiendo de la cobertura de su seguro. Sin embargo, es posible que podamos encontrar un  medicamento sustituto a Electrical engineer un formulario para que el seguro cubra el medicamento que se considera necesario.   Si se requiere una autorizacin previa para que su compaa de seguros [place] su medicamento, por favor permtanos de 1 a 2 das hbiles para completar este proceso.  Los precios de los medicamentos varan con frecuencia dependiendo del Environmental consultant de dnde se surte la receta y alguna farmacias pueden ofrecer precios ms baratos.  El sitio web www.goodrx.com tiene cupones para medicamentos de diferentes farmacias. Los precios aqu no tienen en cuenta lo que podra costar con la ayuda del seguro (puede ser ms barato con su seguro), pero el sitio web puede darle el precio si no utiliz ningn seguro.  - Puede imprimir el cupn correspondiente y llevarlo con su receta a la farmacia.  - Tambin puede pasar por nuestra oficina durante el horario de atencin regular y recoger una tarjeta de cupones de GoodRx.  - Si necesita que su receta se enve electrnicamente a una farmacia diferente, informe a nuestra oficina a travs de MyChart de [place] o por telfono llamando al [phone] y presione la opcin 4.  

## 2022-12-15 ENCOUNTER — Encounter: Payer: Self-pay | Admitting: Dermatology

## 2022-12-15 ENCOUNTER — Telehealth: Payer: Self-pay

## 2022-12-15 NOTE — Telephone Encounter (Addendum)
Tried calling patient. No answer. LMOM for patient to call office.  ----- Message from Alfonso Patten, MD sent at 12/09/2022 10:12 AM EST ----- 1. Skin , right forearm ventral PIGMENTED SEBORRHEIC KERATOSIS This is a benign growth or "wisdom spot". No additional treatment is needed.   2. Skin , right superior shoulder SUPERFICIAL BASAL CELL CARCINOMA --> ED&C. Usually we do this within 2-3 months but OK to wait until April appointment if needed given she is dealing with her husbands illness.   MAs please call. Thank you!

## 2022-12-22 ENCOUNTER — Telehealth: Payer: Self-pay

## 2022-12-22 NOTE — Telephone Encounter (Addendum)
Called and discussed bx results and need for treatment with patient. Patient verbalized understanding and denied further questions. Patient prefers to have treated at follow up appointment at 03/04/23.   ----- Message from Alfonso Patten, MD sent at 12/09/2022 10:12 AM EST ----- 1. Skin , right forearm ventral PIGMENTED SEBORRHEIC KERATOSIS This is a benign growth or "wisdom spot". No additional treatment is needed.   2. Skin , right superior shoulder SUPERFICIAL BASAL CELL CARCINOMA --> ED&C. Usually we do this within 2-3 months but OK to wait until April appointment if needed given she is dealing with her husbands illness.   MAs please call. Thank you!

## 2022-12-24 ENCOUNTER — Telehealth: Payer: Self-pay

## 2022-12-24 NOTE — Telephone Encounter (Signed)
Called and left below message. Ask her to call the office back to scheduled appt.

## 2022-12-24 NOTE — Telephone Encounter (Signed)
I do not recommend she goes off treatment for too long without an earlier appt Can you get her to call us next month or so and make an appointemnt to discuss?

## 2022-12-24 NOTE — Telephone Encounter (Signed)
Returned her call. She has been having a lot of side effects from the Arimidex. She stopped taking it yesterday. She is complaining of joint issues all over and having a hard time walking. Finger joint popping and not feeling well while taking the Arimidex. Hot flashes are better.  She is not requesting appts. Just FYI. Her husband has been diagnosed with liver cancer and getting treatment at [facility]. She is having a lot of stress from her husband being sick.

## 2023-01-27 NOTE — Progress Notes (Signed)
Kristin Carlson presents to clinic today for follow up for radiation treatment for endometrial cancer. She completed treatment on 01-13-22.   {Pain rating:20411}  Patient complains of {CHL RAD ONC CONSTITUTIONAL:11522883}. Reports {Symptoms; urinary:12437} {CHL RAD ONC URINE ROS:11522893}.    Patient states they urinate {nocturnal frequency:311567}.   Patient reports {CHL RAD ONC STOMACH/BOWEL ROS:11522892}, {bm pattern:17885}.   Patient reports {Symptoms; bleeding vaginal:16160}.    Patient {ACTION; IS/IS GI:087931 using their vaginal dilator.  There were no vitals taken for this visit.

## 2023-01-27 NOTE — Progress Notes (Signed)
Radiation Oncology         [phone] ________________________________  Name: Kristin Carlson MRN: [id number]  Date: 01/28/2023  DOB: 05/23/1941  Follow-Up Visit Note  CC: [name], FNP  Lafonda Mosses, MD  No diagnosis found.  Diagnosis: The encounter diagnosis was [place]ometrial cancer ([place]   Stage III (pT3, pN0, cM0) endometrial cancer, high-grade serous   New diagnosis of Left Breast UOQ, Invasive Mammary Carcinoma with DCIS, ER+ / PR+ / Her2-, Grade 2 : s/p lumpectomy   Interval Since Last Radiation: 1 year and 15 days   Intent: Curative  Radiation Treatment Dates: 12/18/2021 through 01/13/2022 Site Technique Total Dose (Gy) Dose per Fx (Gy) Completed Fx Beam Energies  Vagina: Pelvis HDR-brachy 30/30 6 5/5 Ir-192   Narrative:  The patient returns today for routine 6 month follow-up. She was last seen here for follow up on 07/30/22. Since her last visit, the patient followed up with Dr. Berline Lopes on 10/20/22. During which time, the patient endorsed ongoing struggles with constipation, diarrhea and hot flashes (secondary to AI and somewhat managed with effexor). She also reported baseline urinary frequency, urinary incontinence, and intermittent bloating. The patient otherwise denied any symptoms concerning for disease recurrence and she was noted to be NED on examination.  For her hot flashes, Dr. Berline Lopes prescribed her gabapentin given that this seemed to manage her hot flashes better in the past.   The patient also followed up with Dr. Alvy Bimler on 10/29/22. During this visit, the patient reported tolerating Arimidex well. In regards to her hot flashes, the patient requested a lower dose of gabapentin which Dr. Alvy Bimler sent in for her.     Pertinent imaging performed in the interval includes a CT of the chest with contrast on 10/27/22 which showed no significant change in size of the previously biopsied LUL nodule. A new fiducial marker was noted along the inferior aspect of  the nodule and a linear tail projecting posteriorly, which was noted to possibly reflect a mucous impacted bronchus. CT otherwise showed resolution of the previously demonstrated asymmetric increased density in the left breast (s/p breast conserving surgery), no new or enlarging pulmonary nodules, and no evidence of metastatic disease.          ***            Allergies:  is allergic to lactose, codeine, penicillins, sulfa antibiotics, and vancomycin.  Meds: Current Outpatient Medications  Medication Sig Dispense Refill   acetaminophen (TYLENOL) 500 MG tablet Take 1,000 mg by mouth every 6 (six) hours as needed (pain.).     ARIMIDEX 1 MG tablet TAKE 1 TABLET DAILY 90 tablet 3   Cholecalciferol (VITAMIN D3) 50 MCG (2000 UT) TABS Take 2,000 Units by mouth in the morning.     gabapentin (NEURONTIN) 100 MG capsule Take 1 capsule (100 mg total) by mouth 3 (three) times daily. 90 capsule 0   levothyroxine (SYNTHROID) 125 MCG tablet Take 125 mcg by mouth daily before breakfast.     magnesium oxide (MAG-OX) 400 MG tablet Take 400 mg by mouth at bedtime.     Multiple Vitamin (MULTIVITAMIN WITH MINERALS) TABS tablet Take 1 tablet by mouth in the morning.     Omega-3 1000 MG CAPS Take 1,000-2,000 mg by mouth See admin instructions. Take 2 capsules by mouth in the morning & take 1 capsule by mouth at night.     ondansetron (ZOFRAN) 8 MG tablet Take 1 tablet (8 mg total) by mouth every 8 (eight) hours  as needed. (Patient taking differently: Take 8 mg by mouth every 8 (eight) hours as needed for nausea or vomiting.) 30 tablet 1   pantoprazole (PROTONIX) 20 MG tablet Take 20 mg by mouth daily as needed for heartburn or indigestion.     prochlorperazine (COMPAZINE) 10 MG tablet Take 10 mg by mouth every 6 (six) hours as needed for nausea or vomiting.     propranolol (INDERAL) 40 MG tablet Take 40 mg by mouth 2 (two) times daily.     rosuvastatin (CRESTOR) 5 MG tablet Take 5 mg by mouth at bedtime.      Simethicone 125 MG TABS Take 250 mg by mouth 2 (two) times daily as needed (gas).     temazepam (RESTORIL) 15 MG capsule Take 15 mg by mouth at bedtime as needed for sleep.     venlafaxine XR (EFFEXOR-XR) 37.5 MG 24 hr capsule Take 1 capsule (37.5 mg total) by mouth daily with breakfast. 60 capsule 6   No current facility-administered medications for this encounter.    Physical Findings: The patient is in no acute distress. Patient is alert and oriented.  vitals were not taken for this visit. .  No significant changes. Lungs are clear to auscultation bilaterally. Heart has regular rate and rhythm. No palpable cervical, supraclavicular, or axillary adenopathy. Abdomen soft, non-tender, normal bowel sounds.  On pelvic examination the external genitalia were unremarkable. A speculum exam was performed. There are no mucosal lesions noted in the vaginal vault. A Pap smear was obtained of the proximal vagina. On bimanual and rectovaginal examination there were no pelvic masses appreciated. ***   Lab Findings: Lab Results  Component Value Date   WBC 4.3 10/27/2022   HGB 13.1 10/27/2022   HCT 39.4 10/27/2022   MCV 91.2 10/27/2022   PLT 177 10/27/2022    Radiographic Findings: No results found.  Impression: The encounter diagnosis was Endometrial cancer ([place]).   Stage III (pT3, pN0, cM0) endometrial cancer, high-grade serous   New diagnosis of Left Breast UOQ, Invasive Mammary Carcinoma with DCIS, ER+ / PR+ / Her2-, Grade 2 : s/p lumpectomy   The patient is recovering from the effects of radiation.  ***  Plan:  ***   *** minutes of total time was spent for this patient encounter, including preparation, face-to-face counseling with the patient and coordination of care, physical exam, and documentation of the encounter. ____________________________________  Blair Promise, PhD, MD  This document serves as a record of services personally performed by Gery Pray, MD. It was created  on his behalf by Roney Mans, a trained medical scribe. The creation of this record is based on the scribe's personal observations and the provider's statements to them. This document has been checked and approved by the attending provider.

## 2023-01-28 ENCOUNTER — Ambulatory Visit
Admission: RE | Admit: 2023-01-28 | Discharge: 2023-01-28 | Disposition: A | Discharge status: Home / Self Care | Payer: Medicare Other | Source: Ambulatory Visit | Attending: Radiation Oncology | Admitting: Radiation Oncology

## 2023-01-28 ENCOUNTER — Encounter: Payer: Self-pay | Admitting: Radiation Oncology

## 2023-01-28 DIAGNOSIS — Z170 Estrogen receptor positive status [ER+]: Secondary | ICD-10-CM | POA: Insufficient documentation

## 2023-01-28 DIAGNOSIS — Z79899 Other long term (current) drug therapy: Secondary | ICD-10-CM | POA: Insufficient documentation

## 2023-01-28 DIAGNOSIS — C50412 Malignant neoplasm of upper-outer quadrant of left female breast: Secondary | ICD-10-CM | POA: Diagnosis present

## 2023-01-28 DIAGNOSIS — Z8542 Personal history of malignant neoplasm of other parts of uterus: Secondary | ICD-10-CM | POA: Insufficient documentation

## 2023-01-28 DIAGNOSIS — C541 Malignant neoplasm of endometrium: Secondary | ICD-10-CM

## 2023-01-29 ENCOUNTER — Telehealth: Payer: Self-pay

## 2023-01-29 NOTE — Telephone Encounter (Signed)
Returned her call. Her husband is still getting treatment for liver cancer and not doing well. Scheduled a earlier appt for lab and see Dr. Alvy Bimler on 3/12. She is aware of appt times. She may have to call back to reschedule if her husband health worsens. She stopped the Arimidex back in January 2024 due to side effects.  FYI

## 2023-02-09 ENCOUNTER — Inpatient Hospital Stay: Payer: Medicare Other | Attending: Nurse Practitioner

## 2023-02-09 ENCOUNTER — Encounter: Payer: Self-pay | Admitting: Hematology and Oncology

## 2023-02-09 ENCOUNTER — Inpatient Hospital Stay (HOSPITAL_BASED_OUTPATIENT_CLINIC_OR_DEPARTMENT_OTHER): Payer: Medicare Other | Admitting: Hematology and Oncology

## 2023-02-09 VITALS — BP 172/65 | HR 64 | Temp 98.3°F | Resp 18 | Ht 63.0 in | Wt 188.0 lb

## 2023-02-09 DIAGNOSIS — I10 Essential (primary) hypertension: Secondary | ICD-10-CM

## 2023-02-09 DIAGNOSIS — C541 Malignant neoplasm of endometrium: Secondary | ICD-10-CM

## 2023-02-09 DIAGNOSIS — Z1231 Encounter for screening mammogram for malignant neoplasm of breast: Secondary | ICD-10-CM

## 2023-02-09 DIAGNOSIS — C50412 Malignant neoplasm of upper-outer quadrant of left female breast: Secondary | ICD-10-CM | POA: Insufficient documentation

## 2023-02-09 DIAGNOSIS — Z79899 Other long term (current) drug therapy: Secondary | ICD-10-CM | POA: Insufficient documentation

## 2023-02-09 LAB — CMP (CANCER CENTER ONLY)
ALT: 16 U/L (ref 0–44)
AST: 24 U/L (ref 15–41)
Albumin: 4 g/dL (ref 3.5–5.0)
Alkaline Phosphatase: 82 U/L (ref 38–126)
Anion gap: 4 — ABNORMAL LOW (ref 5–15)
BUN: 22 mg/dL (ref 8–23)
CO2: 30 mmol/L (ref 22–32)
Calcium: 9.5 mg/dL (ref 8.9–10.3)
Chloride: 105 mmol/L (ref 98–111)
Creatinine: 0.93 mg/dL (ref 0.44–1.00)
GFR, Estimated: >60 mL/min (ref >60)
Glucose, Bld: 97 mg/dL (ref 70–99)
Potassium: 4.6 mmol/L (ref 3.5–5.1)
Sodium: 139 mmol/L (ref 135–145)
Total Bilirubin: 0.4 mg/dL (ref 0.3–1.2)
Total Protein: 7 g/dL (ref 6.5–8.1)

## 2023-02-09 LAB — CBC WITH DIFFERENTIAL (CANCER CENTER ONLY)
Abs Immature Granulocytes: 0.01 10*3/uL (ref 0.00–0.07)
Basophils Absolute: 0 10*3/uL (ref 0.0–0.1)
Basophils Relative: 1 %
Eosinophils Absolute: 0.2 10*3/uL (ref 0.0–0.5)
Eosinophils Relative: 3 %
HCT: 40.1 % (ref 36.0–46.0)
Hemoglobin: 13.3 g/dL (ref 12.0–15.0)
Immature Granulocytes: 0 %
Lymphocytes Relative: 31 %
Lymphs Abs: 1.4 10*3/uL (ref 0.7–4.0)
MCH: 30.4 pg (ref 26.0–34.0)
MCHC: 33.2 g/dL (ref 30.0–36.0)
MCV: 91.6 fL (ref 80.0–100.0)
Monocytes Absolute: 0.4 10*3/uL (ref 0.1–1.0)
Monocytes Relative: 8 %
Neutro Abs: 2.6 10*3/uL (ref 1.7–7.7)
Neutrophils Relative %: 57 %
Platelet Count: 189 10*3/uL (ref 150–400)
RBC: 4.38 MIL/uL (ref 3.87–5.11)
RDW: 13 % (ref 11.5–15.5)
WBC Count: 4.6 10*3/uL (ref 4.0–10.5)
nRBC: 0 % (ref 0.0–0.2)

## 2023-02-09 NOTE — Assessment & Plan Note (Signed)
She has intolerable side effects from Arimidex and felt better since discontinuation She is not interested to switch over to another brand or tamoxifen We will continue close monitoring She is due for mammogram next month and I have ordered that to be done at [facility]

## 2023-02-09 NOTE — Assessment & Plan Note (Signed)
Blood pressure is intermittently elevated We discussed importance of close monitoring of blood pressure at home

## 2023-02-09 NOTE — Progress Notes (Signed)
[place] OFFICE PROGRESS NOTE  Patient Care Team: [name], FNP as PCP - General (Nurse Practitioner)  ASSESSMENT & PLAN:  Breast cancer of upper-outer quadrant of left female breast [facility]) She has intolerable side effects from Arimidex and felt better since discontinuation She is not interested to switch over to another brand or tamoxifen We will continue close monitoring She is due for mammogram next month and I have ordered that to be done at [facility]  Endometrial cancer [facility]) She has completed adjuvant treatment for this I plan to order CT imaging to be done in July  Essential hypertension Blood pressure is intermittently elevated We discussed importance of close monitoring of blood pressure at home  Orders Placed This Encounter  Procedures   MM 3D DIAGNOSTIC MAMMOGRAM UNILATERAL LEFT BREAST    Standing Status:   Future    Standing Expiration Date:   02/09/2024    Order Specific Question:   Reason for Exam (SYMPTOM  OR DIAGNOSIS REQUIRED)    Answer:   hx left breast ca s/p lumpectomy    Order Specific Question:   Preferred imaging location?    Answer:   [facility]   MM Digital Screening Unilat R    Standing Status:   Future    Standing Expiration Date:   02/09/2024    Order Specific Question:   Reason for Exam (SYMPTOM  OR DIAGNOSIS REQUIRED)    Answer:   screening for breast ca    Order Specific Question:   Preferred imaging location?    Answer:   [facility]   CBC with Differential/Platelet    Standing Status:   Standing    Number of Occurrences:   22    Standing Expiration Date:   02/09/2024   Comprehensive metabolic panel    Standing Status:   Standing    Number of Occurrences:   33    Standing Expiration Date:   02/09/2024    All questions were answered. The patient knows to call the clinic with any problems, questions or concerns. The total time spent in the appointment was 20 minutes encounter with patients including review  of chart and various tests results, discussions about plan of care and coordination of care plan   Heath Lark, MD 02/09/2023 2:35 PM  INTERVAL HISTORY: Please see below for problem oriented charting. she returns for treatment follow-up and discussion She stopped Arimidex recently due to intolerable side effects Since then, her joint pain has disappeared She continues to have mild intermittent hot flashes She denies abdominal pain or changes in bowel habits  REVIEW OF SYSTEMS:   Constitutional: Denies fevers, chills or abnormal weight loss Eyes: Denies blurriness of vision Ears, nose, mouth, throat, and face: Denies mucositis or sore throat Respiratory: Denies cough, dyspnea or wheezes Cardiovascular: Denies palpitation, chest discomfort or lower extremity swelling Gastrointestinal:  Denies nausea, heartburn or change in bowel habits Skin: Denies abnormal skin rashes Lymphatics: Denies new lymphadenopathy or easy bruising Neurological:Denies numbness, tingling or new weaknesses Behavioral/Psych: Mood is stable, no new changes  All other systems were reviewed with the patient and are negative.  I have reviewed the past medical history, past surgical history, social history and family history with the patient and they are unchanged from previous note.  ALLERGIES:  is allergic to lactose, codeine, penicillins, sulfa antibiotics, and vancomycin.  MEDICATIONS:  Current Outpatient Medications  Medication Sig Dispense Refill   acetaminophen (TYLENOL) 500 MG tablet Take 1,000 mg by mouth every 6 (  six) hours as needed (pain.).     Cholecalciferol (VITAMIN D3) 50 MCG (2000 UT) TABS Take 2,000 Units by mouth in the morning.     gabapentin (NEURONTIN) 100 MG capsule Take 1 capsule (100 mg total) by mouth 3 (three) times daily. 90 capsule 0   levothyroxine (SYNTHROID) 125 MCG tablet Take 125 mcg by mouth daily before breakfast.     magnesium oxide (MAG-OX) 400 MG tablet Take 400 mg by mouth at  bedtime.     Multiple Vitamin (MULTIVITAMIN WITH MINERALS) TABS tablet Take 1 tablet by mouth in the morning.     Omega-3 1000 MG CAPS Take 1,000-2,000 mg by mouth See admin instructions. Take 2 capsules by mouth in the morning & take 1 capsule by mouth at night.     ondansetron (ZOFRAN) 8 MG tablet Take 1 tablet (8 mg total) by mouth every 8 (eight) hours as needed. (Patient taking differently: Take 8 mg by mouth every 8 (eight) hours as needed for nausea or vomiting.) 30 tablet 1   pantoprazole (PROTONIX) 20 MG tablet Take 20 mg by mouth daily as needed for heartburn or indigestion.     prochlorperazine (COMPAZINE) 10 MG tablet Take 10 mg by mouth every 6 (six) hours as needed for nausea or vomiting.     propranolol (INDERAL) 40 MG tablet Take 40 mg by mouth 2 (two) times daily.     rosuvastatin (CRESTOR) 5 MG tablet Take 5 mg by mouth at bedtime.     Simethicone 125 MG TABS Take 250 mg by mouth 2 (two) times daily as needed (gas).     temazepam (RESTORIL) 15 MG capsule Take 15 mg by mouth at bedtime as needed for sleep.     venlafaxine XR (EFFEXOR-XR) 37.5 MG 24 hr capsule Take 1 capsule (37.5 mg total) by mouth daily with breakfast. (Patient not taking: Reported on 01/28/2023) 60 capsule 6   No current facility-administered medications for this visit.    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 Evaluation of the pelvis is significantly limited by dense metallic streak artifact from adjacent hip arthroplasty. Within this limitation, there is expansile, masslike, heterogeneously enhancing appearance of the endometrium, measuring at least 5.2 cm in thickness. Findings are in keeping with reported diagnosis of endometrial  malignancy. 2. No evidence of lymphadenopathy or metastatic disease in the chest, abdomen, or pelvis. 3. There is a 0.4 cm fissural nodule the superior segment left lower lobe, almost certainly a benign intrapulmonary lymph node. Attention on follow-up. 4. Coronary artery disease.   10/14/2021 Surgery   TRH/BSO, right SLN biopsy, left pelvic and PA LND, peritoneal nodule biopsy, mini-lap for specimen delivery  Findings: On EUA, 8cm bulbous uterus. ON intra-abdominal entry, normal upper abdominal survey. Normal omentum, small and large bowel. Uterus 8-10cm and bulbous. Normal appearing adnexa. Mapping successful on the right, no mapping on the left. Some mildly prominent lymph nodes bilaterally in the pelvis. Small, <5 mm, nodule in the cul de sac. No obvious intra-abdominal or pelvic evidence of disease.   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 SENTINEL LYMPH NODE, RIGHT EXTERNAL ILIAC, BIOPSY:  - Lymph node, negative for carcinoma (0/1)   B. SENTINEL LYMPH NODE, RIGHT OBTURATOR AND SURROUNDING LYMPH NODES,  BIOPSY:  - Lymph nodes, negative for carcinoma (0/5)   C. UTERUS, CERVIX, BILATERAL FALLOPIAN TUBES AND OVARIES:  - Invasive high-grade serous carcinoma  - Carcinoma focally invades into the serosal surface  - Lymphovascular invasion is present  - Benign unremarkable cervix  - Benign unremarkable bilateral fallopian tubes and ovaries  - See oncology table   D. CUL DE SAC NODULE, POSTERIOR, BIOPSY:  - Endometriosis   E. LYMPH NODE, LEFT PELVIC, BIOPSY:  - Lymph nodes, negative for carcinoma (0/3)   F. LYMPH NODE, LEFT PARAORTIC, BIOPSY:  - Lymph nodes, negative for carcinoma (0/5)   ONCOLOGY TABLE:   UTERUS, CARCINOMA OR  CARCINOSARCOMA: Resection   Procedure: Total hysterectomy and bilateral salpingo-oophorectomy  Histologic Type: Serous carcinoma  Histologic Grade: High-grade  Myometrial Invasion:       Depth of Myometrial Invasion (mm): 32 mm       Myometrial Thickness (mm): 32 mm       Percentage of Myometrial Invasion: 100%  Uterine Serosa Involvement: Present, focal  Cervical stromal Involvement: Not identified  Extent of involvement of other tissue/organs: Not identified  Peritoneal/Ascitic Fluid: Negative for carcinoma  Lymphovascular Invasion: Present  Regional Lymph Nodes:       Pelvic Lymph Nodes Examined:                                   6 Sentinel                                   3 Non-sentinel                                   9 Total       Pelvic Lymph Nodes with Metastasis: 0                           Macrometastasis: (>2.0 mm): 0                           Micrometastasis: (>0.2 mm and < 2.0 mm): 0                           Isolated Tumor Cells (<0.2 mm): 0                           Laterality of Lymph Node with Tumor: Not  applicable                           Extracapsular Extension: Not applicable       Para-aortic Lymph Nodes Examined:                                    0 Sentinel  5 Non-sentinel                                    5 Total       Para-aortic Lymph Nodes with Metastasis: 0                           Macrometastasis: (>2.0 mm): 0                           Micrometastasis:  (>0.2 mm and < 2.0 mm): 0                           Isolated Tumor Cells (<0.2 mm): 0                           Laterality of Lymph Node with Tumor: Not  applicable                           Extracapsular Extension: Not applicable  Distant Metastasis:       Distant Site(s) Involved: Not applicable  Pathologic Stage Classification (pTNM, AJCC 8th Edition): pT3a, pN0  Ancillary Studies: MMR / MSI testing will be ordered  Representative Tumor Block: C4   Comment(s): Pancytokeratin was performed on the lymph nodes and is negative.   By immunohistochemistry, HER-2 is EQUIVOCAL (2+).  HER-2 by FISH is pending and will be reported in an addendum.   FLOURESCENCE IN-SITU HYBRIDIZATION RESULTS:   GROUP 1:  HER2 **POSITIVE**   On the tissue sample received from this individual HER2 FISH was performed by a technologist and cell imaging and analysis on the [employer].   RATIO of HER2/CEN 17 SIGNALS: 2.50  AVERAGE HER2 COPY NUMBER PER CELL: 4.74   The ratio of HER2/CEN 17 result exceeds the cutoff value of >=2.0 and a copy number of HER2 signals exceeding the cutoff range of >=4.0 signals per cell.  Arch Pathol Lab Med 1:1, 2018.    10/28/2021 Cancer Staging   Staging form: Corpus Uteri - Carcinoma and Carcinosarcoma, AJCC 8th Edition - Pathologic stage from 10/28/2021: Stage III (pT3, pN0, cM0) - Signed by Heath Lark, MD on 10/28/2021 Stage prefix: Initial diagnosis   11/07/2021 Procedure   Successful placement of a right internal jugular approach power injectable Port-A-Cath. The catheter is ready for immediate use.   11/11/2021 - 02/26/2022 Chemotherapy   Patient is on Treatment Plan : UTERINE Carboplatin AUC 6 / Paclitaxel q21d      Genetic Testing   Negative genetic testing. No pathogenic variants identified on the [facility] Multi-Cancer+RNA panel. VUS in MET called c.3220A>G, VUS in MUTYh called c.1417G>A and VUS in POLE called c.596G>T identified. The report date is 01/20/2022.  The Multi-Cancer Panel + RNA offered by [facility] includes sequencing and/or deletion duplication testing of the following 84 genes: AIP, ALK, APC, ATM, AXIN2,BAP1,  BARD1, BLM, BMPR1A, BRCA1, BRCA2, BRIP1, CASR, CDC73, CDH1, CDK4, CDKN1B, CDKN1C, CDKN2A (p14ARF), CDKN2A (p16INK4a), CEBPA, CHEK2, CTNNA1, DICER1, DIS3L2, EGFR (c.2369C>T, p.Thr790Met variant only), EPCAM (Deletion/duplication testing only), FH, FLCN, GATA2, GPC3, GREM1 (Promoter region deletion/duplication  testing only), HOXB13 (c.251G>A, p.Gly84Glu), HRAS, KIT, MAX, MEN1, MET, MITF (c.952G>A, p.Glu318Lys variant only), MLH1, MSH2, MSH3, MSH6, MUTYH, NBN, NF1, NF2, NTHL1, PALB2, PDGFRA, PHOX2B, PMS2, POLD1,  POLE, POT1, PRKAR1A, PTCH1, PTEN, RAD50, RAD51C, RAD51D, RB1, RECQL4, RET, RUNX1, SDHAF2, SDHA (sequence changes only), SDHB, SDHC, SDHD, SMAD4, SMARCA4, SMARCB1, SMARCE1, STK11, SUFU, TERC, TERT, TMEM127, TP53, TSC1, TSC2, VHL, WRN and WT1.   03/30/2022 Imaging   1. Bandlike opacity in the medial left lung apex is progressive in the interval, now with nodular component measuring up to 10 mm. While likely related to evolving atelectasis or scar, consider follow-up CT chest in 3 months to reassess. 2. Interval hysterectomy. 3. No evidence for metastatic disease in the chest, abdomen, or pelvis. 4. Trace free fluid in the upper pelvis, nonspecific. Of note, inferior pelvis is essentially obscured by beam hardening artifact from bilateral hip replacement. 5. Left colonic diverticulosis without diverticulitis. 6. Moderate stool volume. Imaging features could be compatible with constipation in the appropriate clinical setting. 7. Aortic Atherosclerosis (ICD10-I70.0).   06/26/2022 Imaging   1. Increasing conspicuity and size of a previously bandlike and now nodular area of density in the LEFT upper lobe suspicious for metastatic process. PET scan and or biopsy could be helpful for further evaluation. 2. Stable small LEFT retropectoral lymph nodes. These warrant attention on follow-up imaging based on patient history. 3. Added density throughout the LEFT breast associated with surgical clips presumably postoperative and or post treatment related. 4. Aortic atherosclerosis.   Aortic Atherosclerosis (ICD10-I70.0).   10/29/2022 Imaging   1. The previously biopsied left upper lobe nodule is not significantly changed in size. There is a new fiducial marker along the inferior aspect of this nodule and a linear  tail projecting posteriorly which may reflect a mucous impacted bronchus. Consider continued CT follow-up as clinically warranted. 2. No new or enlarging pulmonary nodules. No definite evidence of metastatic disease. 3. Interval resolution of previously demonstrated asymmetric increased density in the left breast, likely postsurgical. 4. Aortic Atherosclerosis (ICD10-I70.0) and Emphysema (ICD10-J43.9).     Breast cancer of upper-outer quadrant of left female breast ([place])  11/19/2021 Imaging   IMPRESSION: 1. Highly suspicious mass in the left breast at 1 o'clock measuring 1.9 cm.   2.  No mammographic evidence of malignancy in the right breast   11/27/2021 Procedure   Appropriate positioning of the venous shaped biopsy marking clip at the site of biopsy in the 1 o'clock location of the LEFT breast.   12/02/2021 Initial Diagnosis   Breast cancer of upper-outer quadrant of left female breast ([place])   12/02/2021 Cancer Staging   Staging form: Breast, AJCC 8th Edition - Clinical stage from 12/02/2021: cT1c, cN0, cM0 - Signed by Heath Lark, MD on 12/02/2021 Stage prefix: Initial diagnosis    Genetic Testing   Negative genetic testing. No pathogenic variants identified on the [facility] Multi-Cancer+RNA panel. VUS in MET called c.3220A>G, VUS in MUTYh called c.1417G>A and VUS in POLE called c.596G>T identified. The report date is 01/20/2022.  The Multi-Cancer Panel + RNA offered by [facility] includes sequencing and/or deletion duplication testing of the following 84 genes: AIP, ALK, APC, ATM, AXIN2,BAP1,  BARD1, BLM, BMPR1A, BRCA1, BRCA2, BRIP1, CASR, CDC73, CDH1, CDK4, CDKN1B, CDKN1C, CDKN2A (p14ARF), CDKN2A (p16INK4a), CEBPA, CHEK2, CTNNA1, DICER1, DIS3L2, EGFR (c.2369C>T, p.Thr790Met variant only), EPCAM (Deletion/duplication testing only), FH, FLCN, GATA2, GPC3, GREM1 (Promoter region deletion/duplication testing only), HOXB13 (c.251G>A, p.Gly84Glu), HRAS, KIT, MAX, MEN1, MET, MITF (c.952G>A, p.Glu318Lys  variant only), MLH1, MSH2, MSH3, MSH6, MUTYH, NBN, NF1, NF2, NTHL1, PALB2, PDGFRA, PHOX2B, PMS2, POLD1, POLE, POT1, PRKAR1A, PTCH1, PTEN, RAD50, RAD51C, RAD51D, RB1, RECQL4, RET, RUNX1, SDHAF2, SDHA (sequence changes only), SDHB, SDHC,  SDHD, SMAD4, SMARCA4, SMARCB1, SMARCE1, STK11, SUFU, TERC, TERT, TMEM127, TP53, TSC1, TSC2, VHL, WRN and WT1.   02/23/2022 Imaging   .WHO classification is NORMAL BONE MINERAL DENSITY.    03/30/2022 -  Anti-estrogen oral therapy   She is started on Arimidex   06/16/2022 Pathology Results   FINAL MICROSCOPIC DIAGNOSIS:   A. BREAST, LEFT, LUMPECTOMY:  -  Invasive mammary carcinoma (NST)/invasive ductal carcinoma (NOS) [retained membranous e-cadherin positivity], 1.7 cm in greatest dimension, Nottingham histologic score 2 out of 3.  -  Ductal carcinoma in situ (DCIS), cribriform type, nuclear grade 2 of 3.  -  Biomarkers, performed on the biopsy specimen, ER positive (90% strong), PR positive (11 to 50% moderate), HER2/neu negative  -  Margins negative in conjunction with parts a B and C (closest margin anterior at 5 mm and superior at 9 mm all others greater than 10 mm).  pT1c pNX pM n/a   B. BREAST, LEFT POSTERIOR MARGIN, EXCISION:  -  Benign breast tissue, negative for malignancy (new margin 1.5 cm)   C. BREAST, LEFT MEDIAL MARGIN, EXCISION:  -  Benign breast tissue, negative for malignancy (new margin 1.5 cm)   ONCOLOGY TABLE:   INVASIVE CARCINOMA OF THE BREAST:  Resection   Procedure: Lumpectomy  Specimen Laterality: Left  Histologic Type: Invasive mammary carcinoma of no special type  (NST)/invasive ductal carcinoma (NOS)  Histologic Grade:       Glandular (Acinar)/Tubular Differentiation: 3       Nuclear Pleomorphism: 2       Mitotic Rate: 1       Overall Grade: 2  Tumor Size: 1.7 x 1.5 x 1.0 cm  Ductal Carcinoma In Situ: Cribriform type, nuclear grade 2 of 3  Tumor Extent: Limited to breast parenchyma  Treatment Effect in the Breast: It is  noted the patient has received  chemotherapy for endometrial carcinoma as well as antrum aromatase  inhibitor without morphologic evidence of tumor response  Margins: All margins negative for invasive carcinoma       Distance from Closest Margin (mm): 5 mm and 9 mm       Specify Closest Margin (required only if <46m): Anterior (5) and  Superior (9)  DCIS Margins: Uninvolved by DCIS       Distance from Closest Margin (mm): 5 mm       Specify Closest Margin (required only if <176m: Posterior  Regional Lymph Nodes: Not applicable (no lymph nodes submitted or found)       Number of Lymph Nodes Examined: 0       Number of Sentinel Nodes Examined: 0       Number of Lymph Nodes with Macrometastases (>2 mm): N/A       Number of Lymph Nodes with Micrometastases: N/A       Number of Lymph Nodes with Isolated Tumor Cells (=0.2 mm or =200  cells): N/A       Size of Largest Metastatic Deposit (mm): N/A       Extranodal Extension: N/A  Distant Metastasis:       Distant Site(s) Involved: N/A  Breast Biomarker Testing Performed on Previous Biopsy:       Testing Performed on Case Number: [id number]             Estrogen Receptor: Positive, >90% strong             Progesterone Receptor: Positive, 11 to 50% moderate  HER2: IHC 0/3; negative             Ki-67: Not performed  Pathologic Stage Classification (pTNM, AJCC 8th Edition): pT1c, pNx  Representative Tumor Block: A5  Comment(s): [None]    06/16/2022 Surgery   Preoperative diagnosis: Left breast cancer, clinical stage I  Procedure: Left breast radioactive seed guided lumpectomy Surgeon: Dr. Serita Grammes   07/09/2022 PET scan   1. Hypermetabolic apical left upper lobe nodule, worrisome for primary bronchogenic carcinoma. Metastatic disease is also possibility. 2. Mild residual hypermetabolism associated with the left breast, likely postoperative/post treatment in etiology. Difficult to exclude residual disease. 3. Punctate left  renal stone.  4. Aortic atherosclerosis (ICD10-I70.0). coronary artery calcification.   07/20/2022 Pathology Results   A. LUNG, LEFT UPPER LOBE, NEEDLE CORE BIOPSY:  - Necrotizing granulomatous inflammation.  See comment.   COMMENT:   AFB, PAS-F, and GMS special stains are negative for organisms.      PHYSICAL EXAMINATION: ECOG PERFORMANCE STATUS: 0 - Asymptomatic  Vitals:   02/09/23 1408  BP: (!) 172/65  Pulse: 64  Resp: 18  Temp: 98.3 F (36.8 C)  SpO2: 100%   Filed Weights   02/09/23 1408  Weight: 188 lb (85.3 kg)    GENERAL:alert, no distress and comfortable  NEURO: alert & oriented x 3 with fluent speech, no focal motor/sensory deficits  LABORATORY DATA:  I have reviewed the data as listed    Component Value Date/Time   NA 139 02/09/2023 1353   K 4.6 02/09/2023 1353   CL 105 02/09/2023 1353   CO2 30 02/09/2023 1353   GLUCOSE 97 02/09/2023 1353   BUN 22 02/09/2023 1353   CREATININE 0.93 02/09/2023 1353   CALCIUM 9.5 02/09/2023 1353   PROT 7.0 02/09/2023 1353   ALBUMIN 4.0 02/09/2023 1353   AST 24 02/09/2023 1353   ALT 16 02/09/2023 1353   ALKPHOS 82 02/09/2023 1353   BILITOT 0.4 02/09/2023 1353   GFRNONAA >60 02/09/2023 1353    No results found for: "SPEP", "UPEP"  Lab Results  Component Value Date   WBC 4.6 02/09/2023   NEUTROABS 2.6 02/09/2023   HGB 13.3 02/09/2023   HCT 40.1 02/09/2023   MCV 91.6 02/09/2023   PLT 189 02/09/2023      Chemistry      Component Value Date/Time   NA 139 02/09/2023 1353   K 4.6 02/09/2023 1353   CL 105 02/09/2023 1353   CO2 30 02/09/2023 1353   BUN 22 02/09/2023 1353   CREATININE 0.93 02/09/2023 1353      Component Value Date/Time   CALCIUM 9.5 02/09/2023 1353   ALKPHOS 82 02/09/2023 1353   AST 24 02/09/2023 1353   ALT 16 02/09/2023 1353   BILITOT 0.4 02/09/2023 1353

## 2023-02-09 NOTE — Assessment & Plan Note (Signed)
She has completed adjuvant treatment for this I plan to order CT imaging to be done in July

## 2023-02-16 ENCOUNTER — Telehealth: Payer: Self-pay

## 2023-02-16 NOTE — Telephone Encounter (Signed)
-----   Message from Heath Lark, MD sent at 02/16/2023  8:33 AM EDT ----- Did she get her mammo scheduled at [facility]?

## 2023-02-16 NOTE — Telephone Encounter (Signed)
Called and left a message asking her to call the office back. Left phone # got Breast center at [facility] for her to schedule.

## 2023-02-17 ENCOUNTER — Telehealth: Payer: Self-pay

## 2023-02-17 NOTE — Telephone Encounter (Signed)
She called back trying to schedule her MM but [facility] advised they would need and order from MD. Dr Alvy Bimler has not historically placed orders for MM. Message routed to her for verbal order.  Tried to call pt back to make her aware of request from MD but her VM box is full.

## 2023-02-17 NOTE — Telephone Encounter (Signed)
Mrs Reh called back today. She wanted to make sure the number Hassan Rowan, RN left for her is the number to [employer]. Verified # with pt. She will call to schedule.

## 2023-02-18 ENCOUNTER — Other Ambulatory Visit: Payer: Self-pay

## 2023-02-18 DIAGNOSIS — Z1231 Encounter for screening mammogram for malignant neoplasm of breast: Secondary | ICD-10-CM

## 2023-02-18 DIAGNOSIS — C50412 Malignant neoplasm of upper-outer quadrant of left female breast: Secondary | ICD-10-CM

## 2023-02-18 DIAGNOSIS — C541 Malignant neoplasm of endometrium: Secondary | ICD-10-CM

## 2023-02-18 NOTE — Telephone Encounter (Signed)
Kristin Carlson,  Can you send verbal order?

## 2023-02-18 NOTE — Telephone Encounter (Signed)
Orders entered and called Marlowe Kays. Left a message with [facility] breast center that she can call to schedule. Ask her to call the office back for questions.

## 2023-02-25 ENCOUNTER — Ambulatory Visit
Admission: RE | Admit: 2023-02-25 | Discharge: 2023-02-25 | Disposition: A | Discharge status: Home / Self Care | Payer: Medicare Other | Source: Ambulatory Visit | Attending: Hematology and Oncology | Admitting: Hematology and Oncology

## 2023-02-25 DIAGNOSIS — C50412 Malignant neoplasm of upper-outer quadrant of left female breast: Secondary | ICD-10-CM

## 2023-02-25 DIAGNOSIS — Z1231 Encounter for screening mammogram for malignant neoplasm of breast: Secondary | ICD-10-CM

## 2023-02-25 DIAGNOSIS — C541 Malignant neoplasm of endometrium: Secondary | ICD-10-CM | POA: Insufficient documentation

## 2023-03-04 ENCOUNTER — Encounter: Payer: Self-pay | Admitting: Dermatology

## 2023-03-04 ENCOUNTER — Ambulatory Visit (INDEPENDENT_AMBULATORY_CARE_PROVIDER_SITE_OTHER): Payer: Medicare Other | Admitting: Dermatology

## 2023-03-04 VITALS — BP 175/98 | HR 62

## 2023-03-04 DIAGNOSIS — Z9889 Other specified postprocedural states: Secondary | ICD-10-CM | POA: Diagnosis not present

## 2023-03-04 DIAGNOSIS — C44311 Basal cell carcinoma of skin of nose: Secondary | ICD-10-CM | POA: Diagnosis not present

## 2023-03-04 DIAGNOSIS — L905 Scar conditions and fibrosis of skin: Secondary | ICD-10-CM

## 2023-03-04 DIAGNOSIS — C44612 Basal cell carcinoma of skin of right upper limb, including shoulder: Secondary | ICD-10-CM | POA: Diagnosis not present

## 2023-03-04 DIAGNOSIS — L570 Actinic keratosis: Secondary | ICD-10-CM | POA: Diagnosis not present

## 2023-03-04 DIAGNOSIS — D492 Neoplasm of unspecified behavior of bone, soft tissue, and skin: Secondary | ICD-10-CM

## 2023-03-04 NOTE — Patient Instructions (Addendum)
Strata Triz gel: apply twice daily to scar on right forearm.    Cryotherapy Aftercare (LIP)  Wash gently with soap and water everyday.   Apply Vaseline daily until healed.   Wound Care Instructions  Cleanse wound gently with soap and water once a day then pat dry with clean gauze. Apply a thin coat of Petrolatum (petroleum jelly, "Vaseline") over the wound (unless you have an allergy to this). We recommend that you use a new, sterile tube of Vaseline. Do not pick or remove scabs. Do not remove the yellow or white "healing tissue" from the base of the wound.  Recommend using hydrocolloid bandages. Can change them every 3 days.   You should call the office for your biopsy report after 1 week if you have not already been contacted.  If you experience any problems, such as abnormal amounts of bleeding, swelling, significant bruising, significant pain, or evidence of infection, please call the office immediately.  FOR ADULT SURGERY PATIENTS: If you need something for pain relief you may take 1 extra strength Tylenol (acetaminophen) AND 2 Ibuprofen (200mg  each) together every 4 hours as needed for pain. (do not take these if you are allergic to them or if you have a reason you should not take them.) Typically, you may only need pain medication for 1 to 3 days.         Due to recent changes in healthcare laws, you may see results of your pathology and/or laboratory studies on MyChart before the doctors have had a chance to review them. We understand that in some cases there may be results that are confusing or concerning to you. Please understand that not all results are received at the same time and often the doctors may need to interpret multiple results in order to provide you with the best plan of care or course of treatment. Therefore, we ask that you please give [place] 2 business days to thoroughly review all your results before contacting the office for clarification. Should we see a critical  lab result, you will be contacted sooner.   If You Need Anything After Your Visit  If you have any questions or concerns for your doctor, please call our main line at [phone] and press option 4 to reach your doctor's medical assistant. If no one answers, please leave a voicemail as directed and we will return your call as soon as possible. Messages left after 4 pm will be answered the following business day.   You may also send [place] a message via [place]. We typically respond to MyChart messages within 1-2 business days.  For prescription refills, please ask your pharmacy to contact our office. Our fax number is [phone].  If you have an urgent issue when the clinic is closed that cannot wait until the next business day, you can page your doctor at the number below.    Please note that while we do our best to be available for urgent issues outside of office hours, we are not available 24/7.   If you have an urgent issue and are unable to reach [place], you may choose to seek medical care at your doctor's office, retail clinic, urgent care center, or emergency room.  If you have a medical emergency, please immediately call 911 or go to the emergency department.  Pager Numbers  - Dr. Nehemiah Massed: [phone]  - Dr. Laurence Ferrari: [phone]  - Dr. Nicole Kindred: [phone]  In the event of inclement weather, please call our main line at [phone]  for an update on the status of any delays or closures.  Dermatology Medication Tips: Please keep the boxes that topical medications come in in order to help keep track of the instructions about where and how to use these. Pharmacies typically print the medication instructions only on the boxes and not directly on the medication tubes.   If your medication is too expensive, please contact our office at [phone] option 4 or send [place] a message through [place].   We are unable to tell what your co-pay for medications will be in advance as this is  different depending on your insurance coverage. However, we may be able to find a substitute medication at lower cost or fill out paperwork to get insurance to cover a needed medication.   If a prior authorization is required to get your medication covered by your insurance company, please allow [place] 1-2 business days to complete this process.  Drug prices often vary depending on where the prescription is filled and some pharmacies may offer cheaper prices.  The website www.goodrx.com contains coupons for medications through different pharmacies. The prices here do not account for what the cost may be with help from insurance (it may be cheaper with your insurance), but the website can give you the price if you did not use any insurance.  - You can print the associated coupon and take it with your prescription to the pharmacy.  - You may also stop by our office during regular business hours and pick up a GoodRx coupon card.  - If you need your prescription sent electronically to a different pharmacy, notify our office through [facility] or by phone at [phone] option 4.     Si Usted Necesita Algo Despus de Su Visita  Tambin puede enviarnos un mensaje a travs de Pharmacist, community. Por lo general respondemos a los mensajes de MyChart en el transcurso de 1 a 2 das hbiles.  Para renovar recetas, por favor pida a su farmacia que se ponga en contacto con nuestra oficina. Harland Dingwall de fax es [place] [phone].  Si tiene un asunto urgente cuando la clnica est cerrada y que no puede esperar hasta el siguiente da hbil, puede llamar/localizar a su doctor(a) al nmero que aparece a continuacin.   Por favor, tenga en cuenta que aunque hacemos todo lo posible para estar disponibles para asuntos urgentes fuera del horario de [place], no estamos disponibles las 24 horas del da, los 7 das de la [place].   Si tiene un problema urgente y no puede comunicarse con nosotros, puede optar por buscar  atencin mdica  en el consultorio de su doctor(a), en una clnica privada, en un centro de atencin urgente o en una sala de emergencias.  Si tiene Engineering geologist, por favor llame inmediatamente al 911 o vaya a la sala de emergencias.  Nmeros de bper  - Dr. Nehemiah Massed: [phone]  - Dra. Moye: [phone]  - Dra. Nicole Kindred: [phone]  En caso de inclemencias del [place], por favor llame a Johnsie Kindred principal al [phone] para una actualizacin sobre el [place] de cualquier retraso o cierre.  Consejos para la medicacin en dermatologa: Por favor, guarde las cajas en las que vienen los medicamentos de uso tpico para ayudarle a seguir las instrucciones sobre dnde y cmo usarlos. Las farmacias generalmente imprimen las instrucciones del medicamento slo en las cajas y no directamente en los tubos del [place].   Si su medicamento es [employer], por favor, pngase en contacto con Cleotis Nipper  oficina llamando al [phone] y presione la opcin 4 o envenos un mensaje a travs de Pharmacist, community.   No podemos decirle cul ser su copago por los medicamentos por adelantado ya que esto es diferente dependiendo de la cobertura de su seguro. Sin embargo, es posible que podamos encontrar un medicamento sustituto a Electrical engineer un formulario para que el seguro cubra el medicamento que se considera necesario.   Si se requiere una autorizacin previa para que su compaa de seguros [place] su medicamento, por favor permtanos de 1 a 2 das hbiles para completar este proceso.  Los precios de los medicamentos varan con frecuencia dependiendo del Environmental consultant de dnde se surte la receta y alguna farmacias pueden ofrecer precios ms baratos.  El sitio web www.goodrx.com tiene cupones para medicamentos de Airline pilot. Los precios aqu no tienen en cuenta lo que podra costar con la ayuda del seguro (puede ser ms barato con su seguro), pero el sitio web puede darle el precio si no utiliz  Research scientist (physical sciences).  - Puede imprimir el cupn correspondiente y llevarlo con su receta a la farmacia.  - Tambin puede pasar por nuestra oficina durante el horario de atencin regular y Charity fundraiser una tarjeta de cupones de GoodRx.  - Si necesita que su receta se enve electrnicamente a una farmacia diferente, informe a nuestra oficina a travs de MyChart de [place] o por telfono llamando al [phone] y presione la opcin 4.

## 2023-03-04 NOTE — Progress Notes (Signed)
Follow-Up Visit   Subjective  Kristin Carlson is a 82 y.o. female who presents for the following: BCC. Right superior shoulder. Here for [facility]. Recheck lesion at right lateral cheek.   The following portions of the chart were reviewed this encounter and updated as appropriate: medications, allergies, medical history  Review of Systems:  No other skin or systemic complaints except as noted in HPI or Assessment and Plan.  Objective  Well appearing patient in no apparent distress; mood and affect are within normal limits.   A focused examination was performed of the following areas: Right superior shoulder, face  Relevant exam findings are noted in the Assessment and Plan.  left nasal ala Pink macule with arborizing telangiectasias at edge of scar     Right superior shoulder Pink healing biopsy site  Right lateral Cheek 0.7 cm pink papule       Assessment & Plan    SCAR Secondary to SK biopsy Exam: Violaceous firm lesion Benign-appearing.  Observation.  Call clinic for new or changing lesions. Recommend daily broad spectrum sunscreen SPF 30+, reapply every 2 hours as needed. Treatment: Recommend Serica moisturizing scar formula cream every night or [facility] brand or Mederma silicone scar sheet every night for the first year after a scar appears to help with scar remodeling if desired. Scars remodel on their own for a full year and will gradually improve in appearance over time.  Recommend Serica moisturizing scar formula cream every night or [facility] brand or Mederma silicone scar sheet every night for the first year after a scar appears to help with scar remodeling if desired. Scars remodel on their own for a full year and will gradually improve in appearance over time.  StrataTriz sample given to be applied twice daily.    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 The cure rate is about 98-99%. It is done at another office outside of [place] ([place], [place], or [place]). Once the Mohs surgeon confirms the skin cancer is out, they will discuss the options to repair or heal the area. You must take it easy for about two weeks after surgery (no lifting over 10-15 lbs, avoid activity to get your heart rate and blood pressure up).    Pt defers Mohs. Discussed radiation, ED&C and imiquimod.   ED&C has about an 85% cure rate and leaves a round wound the size of the skin cancer which is healed with ointment and a bandage over a few weeks time. It leaves a round white scar. No additional pathology is done. If the skin cancer were to come back, we would need to do a surgery to remove it.   Patient prefers consultation with radiation.  Will send referral to [facility]  Basal cell carcinoma (BCC) of skin of right upper extremity including shoulder Right superior shoulder  Destruction of lesion  Destruction method: electrodesiccation and curettage   Informed consent: discussed and consent obtained   Timeout:  patient name, date of birth, surgical site, and procedure verified Anesthesia: the lesion was anesthetized in a standard fashion   Anesthetic:  1% lidocaine w/ epinephrine 1-100,000 buffered w/ 8.4% NaHCO3 Curettage performed in three different directions: Yes   Electrodesiccation performed over the curetted area: Yes   Curettage cycles:  3 Final  wound size (cm):  1.6 Hemostasis achieved with:  electrodesiccation Outcome: patient tolerated procedure well with no complications   Post-procedure details: sterile dressing applied and wound care instructions given   Dressing: Duoderm dressing applied today.    Recommend using hydrocolloid bandages. Can change them every 3 days.   Neoplasm of skin Right lateral Cheek  Skin / nail  biopsy Type of biopsy: tangential   Informed consent: discussed and consent obtained   Anesthesia: the lesion was anesthetized in a standard fashion   Anesthesia comment:  Area prepped with alcohol Anesthetic:  1% lidocaine w/ epinephrine 1-100,000 buffered w/ 8.4% NaHCO3 Instrument used: flexible razor blade   Hemostasis achieved with: pressure, aluminum chloride and electrodesiccation   Outcome: patient tolerated procedure well   Post-procedure details: wound care instructions given   Post-procedure details comment:  Ointment and small bandage applied  Specimen 1 - Surgical pathology Differential Diagnosis: R/O BCC  Check Margins: No   ACTINIC KERATOSIS Exam: Erythematous thin papules/macules with gritty scale  Actinic keratoses are precancerous spots that appear secondary to cumulative UV radiation exposure/sun exposure over time. They are chronic with expected duration over 1 year. A portion of actinic keratoses will progress to squamous cell carcinoma of the skin. It is not possible to reliably predict which spots will progress to skin cancer and so treatment is recommended to prevent development of skin cancer.  Recommend daily broad spectrum sunscreen SPF 30+ to sun-exposed areas, reapply every 2 hours as needed.  Recommend staying in the shade or wearing long sleeves, sun glasses (UVA+UVB protection) and wide brim hats (4-inch brim around the entire circumference of the hat). Call for new or changing lesions.  Treatment Plan:  Prior to procedure, discussed risks of blister formation, small wound, skin dyspigmentation, or rare scar following cryotherapy. Recommend Vaseline ointment to treated areas while healing.  Destruction Procedure Note Destruction method: cryotherapy   Informed consent: discussed and consent obtained   Lesion destroyed using liquid nitrogen: Yes   Outcome: patient tolerated procedure well with no complications   Post-procedure details: wound care  instructions given   Locations: left upper cutaneous lip x1 # of Lesions Treated: 1   Return for TBSE reschedule to end of May with Dr. Neale BurlyMoye.  I, Lawson RadarJill Parcell, CMA, am acting as scribe for Darden DatesVIRGINA Gianlucas Evenson, MD.   Documentation: I have reviewed the above documentation for accuracy and completeness, and I agree with the above.  Darden DatesVIRGINA Marsa Matteo, MD

## 2023-03-05 ENCOUNTER — Telehealth: Payer: Self-pay

## 2023-03-05 NOTE — Telephone Encounter (Signed)
-----   Message from Artis Delay, MD sent at 03/05/2023 11:56 AM EDT ----- Pls give her info to call and schedule CT in July

## 2023-03-05 NOTE — Telephone Encounter (Signed)
Called and left below message. Left radiology scheduling # and ask her to call the office back for questions.

## 2023-03-11 ENCOUNTER — Telehealth: Payer: Self-pay

## 2023-03-11 DIAGNOSIS — C44319 Basal cell carcinoma of skin of other parts of face: Secondary | ICD-10-CM

## 2023-03-11 NOTE — Telephone Encounter (Signed)
-----   Message from Sandi Mealy, MD sent at 03/11/2023  1:34 PM EDT ----- Skin , right lateral cheek BASAL CELL CARCINOMA, NODULAR PATTERN --> please let Ms. Kristin Carlson know and place referral for radiation for this area as well. We previously discussed and she does not want a surgical treatment at this time. We already sent referral for [facility] at the nose.   MAs please call. Thank you!

## 2023-03-11 NOTE — Telephone Encounter (Signed)
Called patient. N/A. VM full. Referral sent to radiation oncology at [facility].

## 2023-03-16 ENCOUNTER — Encounter: Payer: Self-pay | Admitting: Radiation Oncology

## 2023-03-16 ENCOUNTER — Ambulatory Visit
Admission: RE | Admit: 2023-03-16 | Discharge: 2023-03-16 | Disposition: A | Discharge status: Home / Self Care | Payer: Medicare Other | Source: Ambulatory Visit | Attending: Radiation Oncology | Admitting: Radiation Oncology

## 2023-03-16 VITALS — BP 170/98 | HR 69 | Temp 98.0°F | Ht 63.0 in | Wt 191.0 lb

## 2023-03-16 DIAGNOSIS — Z79890 Hormone replacement therapy (postmenopausal): Secondary | ICD-10-CM | POA: Insufficient documentation

## 2023-03-16 DIAGNOSIS — E785 Hyperlipidemia, unspecified: Secondary | ICD-10-CM | POA: Diagnosis not present

## 2023-03-16 DIAGNOSIS — L570 Actinic keratosis: Secondary | ICD-10-CM | POA: Diagnosis not present

## 2023-03-16 DIAGNOSIS — I10 Essential (primary) hypertension: Secondary | ICD-10-CM | POA: Diagnosis not present

## 2023-03-16 DIAGNOSIS — Z801 Family history of malignant neoplasm of trachea, bronchus and lung: Secondary | ICD-10-CM | POA: Insufficient documentation

## 2023-03-16 DIAGNOSIS — E039 Hypothyroidism, unspecified: Secondary | ICD-10-CM | POA: Diagnosis not present

## 2023-03-16 DIAGNOSIS — Z87891 Personal history of nicotine dependence: Secondary | ICD-10-CM | POA: Insufficient documentation

## 2023-03-16 DIAGNOSIS — Z79899 Other long term (current) drug therapy: Secondary | ICD-10-CM | POA: Diagnosis not present

## 2023-03-16 DIAGNOSIS — C44311 Basal cell carcinoma of skin of nose: Secondary | ICD-10-CM | POA: Diagnosis not present

## 2023-03-16 DIAGNOSIS — C4491 Basal cell carcinoma of skin, unspecified: Secondary | ICD-10-CM

## 2023-03-16 DIAGNOSIS — K219 Gastro-esophageal reflux disease without esophagitis: Secondary | ICD-10-CM | POA: Insufficient documentation

## 2023-03-16 NOTE — Consult Note (Signed)
NEW PATIENT EVALUATION  Name: Kristin Carlson  MRN: [id number]  Date:   03/16/2023     DOB: January 28, 1941   This 82 y.o. female patient presents to the clinic for initial evaluation of basal cell carcinoma of the right cheek as well as nose both nodular pattern.  REFERRING PHYSICIAN: Olena Leatherwood, FNP  CHIEF COMPLAINT:  Chief Complaint  Patient presents with   [facility] nasal    DIAGNOSIS: The encounter diagnosis was Basal cell carcinoma (BCC), unspecified site.   PREVIOUS INVESTIGATIONS:  Pathology report reviewed Clinical notes reviewed  HPI: Patient is a 82 year old female presenting with 2 lesions one of the left nasal alla and one of the right cheek.  Both were biopsy positive for basal cell carcinoma nodular pattern.  She was offered Mohs chemosurgery although has declined.  She is seen today for evaluation of electron-beam therapy.  PLANNED TREATMENT REGIMEN: Electron-beam therapy  PAST MEDICAL HISTORY:  has a past medical history of Actinic keratosis (06/04/2021), Anemia, Arthritis, Basal cell carcinoma (06/04/2021), Basal cell carcinoma (07/29/2021), Basal cell carcinoma (03/04/2023), BCC (basal cell carcinoma of skin) (12/03/2022), Family history of lung cancer, GERD (gastroesophageal reflux disease), History of basal cell carcinoma (07/29/2021), History of radiation therapy, HLD (hyperlipidemia), Hypertension, Hypothyroidism, Pneumonia, and PONV (postoperative nausea and vomiting).    PAST SURGICAL HISTORY:  Past Surgical History:  Procedure Laterality Date   BREAST BIOPSY Left 11/27/2021   u/s bs, venus clip,  INVASIVE MAMMARY CARCINOMA   BREAST CYST ASPIRATION     BREAST LUMPECTOMY Left 06/16/2022   BREAST LUMPECTOMY WITH RADIOACTIVE SEED LOCALIZATION Left 06/16/2022   Procedure: LEFT BREAST LUMPECTOMY WITH RADIOACTIVE SEED LOCALIZATION;  Surgeon: Emelia Loron, MD;  Location:  SURGERY CENTER;  Service: General;  Laterality: Left;   BRONCHIAL BIOPSY   07/10/2022   Procedure: BRONCHIAL BIOPSIES;  Surgeon: Omar Person, MD;  Location: [facility] ENDOSCOPY;  Service: Pulmonary;;   BRONCHIAL BRUSHINGS  07/10/2022   Procedure: BRONCHIAL BRUSHINGS;  Surgeon: Omar Person, MD;  Location: [facility] ENDOSCOPY;  Service: Pulmonary;;   BRONCHIAL NEEDLE ASPIRATION BIOPSY  07/10/2022   Procedure: BRONCHIAL NEEDLE ASPIRATION BIOPSIES;  Surgeon: Omar Person, MD;  Location: [facility] ENDOSCOPY;  Service: Pulmonary;;   BRONCHIAL WASHINGS  07/10/2022   Procedure: BRONCHIAL WASHINGS;  Surgeon: Omar Person, MD;  Location: [facility] ENDOSCOPY;  Service: Pulmonary;;   cataract Bilateral    FIDUCIAL MARKER PLACEMENT  07/10/2022   Procedure: FIDUCIAL MARKER PLACEMENT;  Surgeon: Omar Person, MD;  Location: [facility] ENDOSCOPY;  Service: Pulmonary;;   HIP SURGERY     IR IMAGING GUIDED PORT INSERTION  11/07/2021   IR REMOVAL TUN ACCESS W/ PORT W/O FL MOD SED  01/06/2022   KNEE ARTHROSCOPY Left 2003   NECK SURGERY  1997   Fusion C3, C4, C5   ROBOTIC ASSISTED TOTAL HYSTERECTOMY WITH BILATERAL SALPINGO OOPHERECTOMY Bilateral 10/14/2021   Procedure: XI ROBOTIC ASSISTED TOTAL HYSTERECTOMY WITH BILATERAL SALPINGO OOPHORECTOMY, LYMPH NODE DISSECTION,  LAPAROTOMY;  Surgeon: Carver Fila, MD;  Location: [facility] ORS;  Service: Gynecology;  Laterality: Bilateral;   SENTINEL NODE BIOPSY N/A 10/14/2021   Procedure: SENTINEL NODE DISSECTION;  Surgeon: Carver Fila, MD;  Location: [facility] ORS;  Service: Gynecology;  Laterality: N/A;   THYROIDECTOMY  2007   TOTAL HIP ARTHROPLASTY Bilateral    Left Hip -2015, Right Hip 208   VIDEO BRONCHOSCOPY WITH RADIAL ENDOBRONCHIAL ULTRASOUND  07/10/2022   Procedure: RADIAL ENDOBRONCHIAL ULTRASOUND;  Surgeon: Omar Person, MD;  Location: [facility] ENDOSCOPY;  Service: Pulmonary;;    FAMILY HISTORY: family history includes Cancer in her father and paternal aunt; Lung cancer in her father; Skin cancer in her maternal grandmother and paternal  grandmother.  SOCIAL HISTORY:  reports that she quit smoking about 56 years ago. Her smoking use included cigarettes. She has a 1.50 pack-year smoking history. She has never used smokeless tobacco. She reports current alcohol use. She reports that she does not use drugs.  ALLERGIES: Lactose, Codeine, Penicillins, Sulfa antibiotics, and Vancomycin  MEDICATIONS:  Current Outpatient Medications  Medication Sig Dispense Refill   acetaminophen (TYLENOL) 500 MG tablet Take 1,000 mg by mouth every 6 (six) hours as needed (pain.).     Cholecalciferol (VITAMIN D3) 50 MCG (2000 UT) TABS Take 2,000 Units by mouth in the morning.     gabapentin (NEURONTIN) 100 MG capsule Take 1 capsule (100 mg total) by mouth 3 (three) times daily. 90 capsule 0   levothyroxine (SYNTHROID) 125 MCG tablet Take 125 mcg by mouth daily before breakfast.     magnesium oxide (MAG-OX) 400 MG tablet Take 400 mg by mouth at bedtime.     Multiple Vitamin (MULTIVITAMIN WITH MINERALS) TABS tablet Take 1 tablet by mouth in the morning.     Omega-3 1000 MG CAPS Take 1,000-2,000 mg by mouth See admin instructions. Take 2 capsules by mouth in the morning & take 1 capsule by mouth at night.     ondansetron (ZOFRAN) 8 MG tablet Take 1 tablet (8 mg total) by mouth every 8 (eight) hours as needed. (Patient taking differently: Take 8 mg by mouth every 8 (eight) hours as needed for nausea or vomiting.) 30 tablet 1   pantoprazole (PROTONIX) 20 MG tablet Take 20 mg by mouth daily as needed for heartburn or indigestion.     prochlorperazine (COMPAZINE) 10 MG tablet Take 10 mg by mouth every 6 (six) hours as needed for nausea or vomiting.     propranolol (INDERAL) 40 MG tablet Take 40 mg by mouth 2 (two) times daily.     rosuvastatin (CRESTOR) 5 MG tablet Take 5 mg by mouth at bedtime.     Simethicone 125 MG TABS Take 250 mg by mouth 2 (two) times daily as needed (gas).     temazepam (RESTORIL) 15 MG capsule Take 15 mg by mouth at bedtime as needed  for sleep.     venlafaxine XR (EFFEXOR-XR) 37.5 MG 24 hr capsule Take 1 capsule (37.5 mg total) by mouth daily with breakfast. (Patient not taking: Reported on 01/28/2023) 60 capsule 6   No current facility-administered medications for this encounter.    ECOG PERFORMANCE STATUS:  0 - Asymptomatic  REVIEW OF SYSTEMS: Patient denies any weight loss, fatigue, weakness, fever, chills or night sweats. Patient denies any loss of vision, blurred vision. Patient denies any ringing  of the ears or hearing loss. No irregular heartbeat. Patient denies heart murmur or history of fainting. Patient denies any chest pain or pain radiating to her upper extremities. Patient denies any shortness of breath, difficulty breathing at night, cough or hemoptysis. Patient denies any swelling in the lower legs. Patient denies any nausea vomiting, vomiting of blood, or coffee ground material in the vomitus. Patient denies any stomach pain. Patient states has had normal bowel movements no significant constipation or diarrhea. Patient denies any dysuria, hematuria or significant nocturia. Patient denies any problems walking, swelling in the joints or loss of balance. Patient denies any skin changes, loss of hair or loss of weight. Patient  denies any excessive worrying or anxiety or significant depression. Patient denies any problems with insomnia. Patient denies excessive thirst, polyuria, polydipsia. Patient denies any swollen glands, patient denies easy bruising or easy bleeding. Patient denies any recent infections, allergies or URI. Patient "s visual fields have not changed significantly in recent time.   PHYSICAL EXAM: BP (!) 170/98 Comment: Patient states she has an irregular  Pulse 69   Temp 98 F (36.7 C) (Tympanic)   Ht  (1.6 m)   Wt 191 lb (86.6 kg)   BMI 33.83 kg/m  Small slightly erythematous lesion of the left nasal alla.  Also small lesion of the right cheek.  No evidence of submental or cervical  adenopathy.  LABORATORY DATA: Pathology reports reviewed    RADIOLOGY RESULTS: No current films for review   IMPRESSION: Basal cell carcinoma of the left nasal alla as well as the right cheek in 82 year old female  PLAN: At this time I will treat both areas concurrently with electron-beam therapy.  Will plan on delivering 40 Gray in 20 fractions.  Risks and benefits of treatment including skin reaction fatigue were discussed in detail with the patient.  I personally set up and ordered CT simulation for the following week.  Patient comprehends my recommendations well.  I would like to take this opportunity to thank you for allowing me to participate in the care of your patient.Carmina Miller, MD

## 2023-03-25 ENCOUNTER — Telehealth: Payer: Self-pay

## 2023-03-25 DIAGNOSIS — C44310 Basal cell carcinoma of skin of unspecified parts of face: Secondary | ICD-10-CM

## 2023-03-25 NOTE — Telephone Encounter (Signed)
-----   Message from Virginia Moye, MD sent at 03/11/2023  1:34 PM EDT ----- Skin , right lateral cheek BASAL CELL CARCINOMA, NODULAR PATTERN --> please let Ms. Kristin Carlson know and place referral for radiation for this area as well. We previously discussed and she does not want a surgical treatment at this time. We already sent referral for BCC at the nose.   MAs please call. Thank you! 

## 2023-03-25 NOTE — Telephone Encounter (Signed)
I spoke with patient and discussed pathology results. She did have an appointment with radiology, but she cancelled it due to having to go five days a week for six weeks. Patient wants to take time to think about what she should do next as she doesn't want to have the Mohs procedure either. Patient states that she will eventually treat it, and not ignore it completely. I advised her to contact our office if she needs anything in the mean time, and to contact our office to let us know her decision.

## 2023-03-29 ENCOUNTER — Ambulatory Visit: Payer: Medicare Other

## 2023-03-30 NOTE — Telephone Encounter (Signed)
I discussed with patient topical treatment option today, and she has decided to go through with Mohs, but at a future date. We discussed different Mohs offices and locations and she prefers [facility] in [place], [place]. Referral e-mailed today. Advised patient they will contact her to schedule and she will let them know when is good for her.

## 2023-03-30 NOTE — Telephone Encounter (Signed)
Please advise her if she declines radiation and Mohs or a scrape and burn treatment at this time, I would recommend treating with imiquimod 5% cream 5 times per week for 6 weeks. This may make the area inflamed or irritated. It brings in her immune system to fight off the skin cancer. The cure rate this this is lower than with radiation or Mohs surgery - around 75% -- and I would recommend we recheck the areas at her follow-up and monitor for anything coming back.

## 2023-03-31 NOTE — Telephone Encounter (Signed)
Thank you :)

## 2023-04-12 ENCOUNTER — Telehealth: Payer: Self-pay

## 2023-04-12 NOTE — Telephone Encounter (Signed)
Appointment cancellation notification from [facility] scanned in under the media tab for your review.

## 2023-04-14 NOTE — Telephone Encounter (Signed)
Thank you. She advises she needs to cancel due to her husband's illness and will reschedule in a few months.

## 2023-04-20 ENCOUNTER — Telehealth: Payer: Self-pay | Admitting: *Deleted

## 2023-04-20 NOTE — Telephone Encounter (Signed)
Patient called and moved appt from 5/24 to 6/14

## 2023-04-23 ENCOUNTER — Inpatient Hospital Stay: Payer: Medicare Other | Admitting: Gynecologic Oncology

## 2023-04-29 ENCOUNTER — Ambulatory Visit (INDEPENDENT_AMBULATORY_CARE_PROVIDER_SITE_OTHER): Payer: Medicare Other | Admitting: Dermatology

## 2023-04-29 ENCOUNTER — Encounter: Payer: Self-pay | Admitting: Dermatology

## 2023-04-29 DIAGNOSIS — L578 Other skin changes due to chronic exposure to nonionizing radiation: Secondary | ICD-10-CM

## 2023-04-29 DIAGNOSIS — X32XXXA Exposure to sunlight, initial encounter: Secondary | ICD-10-CM

## 2023-04-29 DIAGNOSIS — D1801 Hemangioma of skin and subcutaneous tissue: Secondary | ICD-10-CM

## 2023-04-29 DIAGNOSIS — Z1283 Encounter for screening for malignant neoplasm of skin: Secondary | ICD-10-CM

## 2023-04-29 DIAGNOSIS — L570 Actinic keratosis: Secondary | ICD-10-CM | POA: Diagnosis not present

## 2023-04-29 DIAGNOSIS — C44311 Basal cell carcinoma of skin of nose: Secondary | ICD-10-CM

## 2023-04-29 DIAGNOSIS — W908XXA Exposure to other nonionizing radiation, initial encounter: Secondary | ICD-10-CM | POA: Diagnosis not present

## 2023-04-29 DIAGNOSIS — C44319 Basal cell carcinoma of skin of other parts of face: Secondary | ICD-10-CM

## 2023-04-29 DIAGNOSIS — L821 Other seborrheic keratosis: Secondary | ICD-10-CM

## 2023-04-29 DIAGNOSIS — D229 Melanocytic nevi, unspecified: Secondary | ICD-10-CM

## 2023-04-29 DIAGNOSIS — L814 Other melanin hyperpigmentation: Secondary | ICD-10-CM

## 2023-04-29 DIAGNOSIS — Z85828 Personal history of other malignant neoplasm of skin: Secondary | ICD-10-CM

## 2023-04-29 NOTE — Patient Instructions (Addendum)
Cryotherapy Aftercare  Wash gently with soap and water everyday.   Apply Vaseline Jelly daily until healed.    Recommend Serica moisturizing scar formula cream every night or [facility] brand or Mederma silicone scar sheet every night for the first year after a scar appears to help with scar remodeling if desired. Scars remodel on their own for a full year and will gradually improve in appearance over time.   Recommend daily broad spectrum sunscreen SPF 30+ to sun-exposed areas, reapply every 2 hours as needed. Call for new or changing lesions.  Staying in the shade or wearing long sleeves, sun glasses (UVA+UVB protection) and wide brim hats (4-inch brim around the entire circumference of the hat) are also recommended for sun protection.    Recommend taking Heliocare sun protection supplement daily in sunny weather for additional sun protection. For maximum protection on the sunniest days, you can take up to 2 capsules of regular Heliocare OR take 1 capsule of Heliocare Ultra. For prolonged exposure (such as a full day in the sun), you can repeat your dose of the supplement 4 hours after your first dose. Heliocare can be purchased at [employer], at some [facility] or at [email or web address].    Melanoma ABCDEs  Melanoma is the most dangerous type of skin cancer, and is the leading cause of death from skin disease.  You are more likely to develop melanoma if you: Have light-colored skin, light-colored eyes, or red or blond hair Spend a lot of time in the sun Tan regularly, either outdoors or in a tanning bed Have had blistering sunburns, especially during childhood Have a close family member who has had a melanoma Have atypical moles or large birthmarks  Early detection of melanoma is key since treatment is typically straightforward and cure rates are extremely high if we catch it early.   The first sign of melanoma is often a change in a mole or a new dark spot.  The ABCDE system is a  way of remembering the signs of melanoma.  A for asymmetry:  The two halves do not match. B for border:  The edges of the growth are irregular. C for color:  A mixture of colors are present instead of an even brown color. D for diameter:  Melanomas are usually (but not always) greater than 6mm - the size of a pencil eraser. E for evolution:  The spot keeps changing in size, shape, and color.  Please check your skin once per month between visits. You can use a small mirror in front and a large mirror behind you to keep an eye on the back side or your body.   If you see any new or changing lesions before your next follow-up, please call to schedule a visit.  Please continue daily skin protection including broad spectrum sunscreen SPF 30+ to sun-exposed areas, reapplying every 2 hours as needed when you're outdoors.   Staying in the shade or wearing long sleeves, sun glasses (UVA+UVB protection) and wide brim hats (4-inch brim around the entire circumference of the hat) are also recommended for sun protection.    Due to recent changes in healthcare laws, you may see results of your pathology and/or laboratory studies on MyChart before the doctors have had a chance to review them. We understand that in some cases there may be results that are confusing or concerning to you. Please understand that not all results are received at the same time and often the doctors may need to interpret  multiple results in order to provide you with the best plan of care or course of treatment. Therefore, we ask that you please give [place] 2 business days to thoroughly review all your results before contacting the office for clarification. Should we see a critical lab result, you will be contacted sooner.   If You Need Anything After Your Visit  If you have any questions or concerns for your doctor, please call our main line at [phone] and press option 4 to reach your doctor's medical assistant. If no one answers,  please leave a voicemail as directed and we will return your call as soon as possible. Messages left after 4 pm will be answered the following business day.   You may also send [place] a message via MyChart. We typically respond to MyChart messages within 1-2 business days.  For prescription refills, please ask your pharmacy to contact our office. Our fax number is [phone].  If you have an urgent issue when the clinic is closed that cannot wait until the next business day, you can page your doctor at the number below.    Please note that while we do our best to be available for urgent issues outside of office hours, we are not available 24/7.   If you have an urgent issue and are unable to reach [place], you may choose to seek medical care at your doctor's office, retail clinic, urgent care center, or emergency room.  If you have a medical emergency, please immediately call 911 or go to the emergency department.  Pager Numbers  - Dr. Gwen Pounds: [phone]  - Dr. Neale Burly: [phone]  - Dr. Roseanne Reno: [phone]  In the event of inclement weather, please call our main line at [phone] for an update on the status of any delays or closures.  Dermatology Medication Tips: Please keep the boxes that topical medications come in in order to help keep track of the instructions about where and how to use these. Pharmacies typically print the medication instructions only on the boxes and not directly on the medication tubes.   If your medication is too expensive, please contact our office at [phone] option 4 or send [place] a message through MyChart.   We are unable to tell what your co-pay for medications will be in advance as this is different depending on your insurance coverage. However, we may be able to find a substitute medication at lower cost or fill out paperwork to get insurance to cover a needed medication.   If a prior authorization is required to get your medication covered by your  insurance company, please allow [place] 1-2 business days to complete this process.  Drug prices often vary depending on where the prescription is filled and some pharmacies may offer cheaper prices.  The website www.goodrx.com contains coupons for medications through different pharmacies. The prices here do not account for what the cost may be with help from insurance (it may be cheaper with your insurance), but the website can give you the price if you did not use any insurance.  - You can print the associated coupon and take it with your prescription to the pharmacy.  - You may also stop by our office during regular business hours and pick up a GoodRx coupon card.  - If you need your prescription sent electronically to a different pharmacy, notify our office through [facility] or by phone at [phone] option 4.     Si Usted Necesita Algo Despus de Su Visita  Tambin puede enviarnos un mensaje a travs de MyChart. Por lo general respondemos a los mensajes de MyChart en el transcurso de 1 a 2 das hbiles.  Para renovar recetas, por favor pida a su farmacia que se ponga en contacto con nuestra oficina. Annie Sable de fax es [place] [phone].  Si tiene un asunto urgente cuando la clnica est cerrada y que no puede esperar hasta el siguiente da hbil, puede llamar/localizar a su doctor(a) al nmero que aparece a continuacin.   Por favor, tenga en cuenta que aunque hacemos todo lo posible para estar disponibles para asuntos urgentes fuera del horario de [place], no estamos disponibles las 24 horas del da, los 7 [address]  [address] de la [place].   Si tiene un problema urgente y no puede comunicarse con nosotros, puede optar por buscar atencin mdica  en el consultorio de su doctor(a), en una clnica privada, en un centro de atencin urgente o en una sala de emergencias.  Si tiene Engineer, drilling, por favor llame inmediatamente al 911 o vaya a la sala de emergencias.  Nmeros de  bper  - Dr. Gwen Pounds: [phone]  - Dra. Moye: [phone]  - Dra. Roseanne Reno: [phone]  En caso de inclemencias del [place], por favor llame a Lacy Duverney principal al [phone] para una actualizacin sobre el [place] de cualquier retraso o cierre.  Consejos para la medicacin en dermatologa: Por favor, guarde las cajas en las que vienen los medicamentos de uso tpico para ayudarle a seguir las instrucciones sobre dnde y cmo usarlos. Las farmacias generalmente imprimen las instrucciones del medicamento slo en las cajas y no directamente en los tubos del [place].   Si su medicamento es muy caro, por favor, pngase en contacto con Rolm Gala llamando al [phone] y presione la opcin 4 o envenos un mensaje a travs de Clinical cytogeneticist.   No podemos decirle cul ser su copago por los medicamentos por adelantado ya que esto es diferente dependiendo de la cobertura de su seguro. Sin embargo, es posible que podamos encontrar un medicamento sustituto a Audiological scientist un formulario para que el seguro cubra el medicamento que se considera necesario.   Si se requiere una autorizacin previa para que su compaa de seguros [place] su medicamento, por favor permtanos de 1 a 2 das hbiles para completar [address]  Los precios de los medicamentos varan con frecuencia dependiendo del Environmental consultant de dnde se surte la receta y alguna farmacias pueden ofrecer precios ms baratos.  El sitio web www.goodrx.com tiene cupones para medicamentos de Health and safety inspector. Los precios aqu no tienen en cuenta lo que podra costar con la ayuda del seguro (puede ser ms barato con su seguro), pero el sitio web puede darle el precio si no utiliz Tourist information centre manager.  - Puede imprimir el cupn correspondiente y llevarlo con su receta a la farmacia.  - Tambin puede pasar por nuestra oficina durante el horario de atencin regular y Education officer, museum una tarjeta de cupones de GoodRx.  - Si necesita que su receta se  enve electrnicamente a una farmacia diferente, informe a nuestra oficina a travs de MyChart de  o por telfono llamando al [phone] y presione la opcin 4.

## 2023-04-29 NOTE — Progress Notes (Signed)
Follow-Up Visit   Subjective  Kristin Carlson is a 82 y.o. female who presents for the following: Skin Cancer Screening and Full Body Skin Exam  The patient presents for Total-Body Skin Exam (TBSE) for skin cancer screening and mole check. The patient has spots, moles and lesions to be evaluated, some may be new or changing and the patient has concerns that these could be cancer.   The following portions of the chart were reviewed this encounter and updated as appropriate: medications, allergies, medical history  Review of Systems:  No other skin or systemic complaints except as noted in HPI or Assessment and Plan.  Objective  Well appearing patient in no apparent distress; mood and affect are within normal limits.  A full examination was performed including scalp, head, eyes, ears, nose, lips, neck, chest, axillae, abdomen, back, buttocks, bilateral upper extremities, bilateral lower extremities, hands, feet, fingers, toes, fingernails, and toenails. All findings within normal limits unless otherwise noted below.   Relevant physical exam findings are noted in the Assessment and Plan.  Right Antecubital Fossa x1, right forearm (within SK) x1 (2) Erythematous thin papules/macules with gritty scale.     Assessment & Plan   Basal cell carcinoma  left nasal ala, right lateral cheek   She is planning Mohs once her husband is better or has passed. She scheduled but her husband went back in the hospital with poor prognosis. He is out again and doing better now, but the hospitalizations have been frequent. We have discussed other options including ED&C, radiation, topical therapy in depth. She prefers to wait and have Mohs done when she is able.     Mohs involves cutting out right around the spot and then checking under the microscope to be sure the whole skin cancer is out. The cure rate is about 98-99%. It is done at another office outside of [place] ([place], [place], or  [place]). Once the Mohs surgeon confirms the skin cancer is out, they will discuss the options to repair or heal the area. You must take it easy for about two weeks after surgery (no lifting over 10-15 lbs, avoid activity to get your heart rate and blood pressure up).     Pt defers Mohs. Discussed radiation, ED&C and imiquimod.  Patient's husband has been in the hospital and not doing well since last visit here. Patient deferred treatment at this time. Patient states she has discussed with Mohs surgeon and they are aware that she will call when available to do surgery.   ED&C has about an 85% cure rate and leaves a round wound the size of the skin cancer which is healed with ointment and a bandage over a few weeks time. It leaves a round white scar. No additional pathology is done. If the skin cancer were to come back, we would need to do a surgery to remove it.    HISTORY OF BASAL CELL CARCINOMA OF THE SKIN - No evidence of recurrence today - Recommend regular full body skin exams - Recommend daily broad spectrum sunscreen SPF 30+ to sun-exposed areas, reapply every 2 hours as needed.  - Call if any new or changing lesions are noted between office visits  Recommend Serica moisturizing scar formula cream every night or [employer] brand or Mederma silicone scar sheet every night for the first year after a scar appears to help with scar remodeling if desired. Scars remodel on their own for a full year and will gradually improve in appearance over time.  LENTIGINES, SEBORRHEIC KERATOSES, HEMANGIOMAS - Benign normal skin lesions - Benign-appearing - Call for any changes  MELANOCYTIC NEVI - Tan-brown and/or pink-flesh-colored symmetric macules and papules - Benign appearing on exam today - Observation - Call clinic for new or changing moles - Recommend daily use of broad spectrum spf 30+ sunscreen to sun-exposed areas.   ACTINIC DAMAGE - Chronic condition, secondary to cumulative UV/sun  exposure - diffuse scaly erythematous macules with underlying dyspigmentation - Recommend daily broad spectrum sunscreen SPF 30+ to sun-exposed areas, reapply every 2 hours as needed.  - Staying in the shade or wearing long sleeves, sun glasses (UVA+UVB protection) and wide brim hats (4-inch brim around the entire circumference of the hat) are also recommended for sun protection.  - Call for new or changing lesions.  SKIN CANCER SCREENING PERFORMED TODAY.    AK (actinic keratosis) (2) Right Antecubital Fossa x1, right forearm (within SK) x1  Actinic keratoses are precancerous spots that appear secondary to cumulative UV radiation exposure/sun exposure over time. They are chronic with expected duration over 1 year. A portion of actinic keratoses will progress to squamous cell carcinoma of the skin. It is not possible to reliably predict which spots will progress to skin cancer and so treatment is recommended to prevent development of skin cancer.  Recommend daily broad spectrum sunscreen SPF 30+ to sun-exposed areas, reapply every 2 hours as needed.  Recommend staying in the shade or wearing long sleeves, sun glasses (UVA+UVB protection) and wide brim hats (4-inch brim around the entire circumference of the hat). Call for new or changing lesions.  Prior to procedure, discussed risks of blister formation, small wound, skin dyspigmentation, or rare scar following cryotherapy. Recommend Vaseline ointment to treated areas while healing.   Destruction of lesion - Right Antecubital Fossa x1, right forearm (within SK) x1  Destruction method: cryotherapy   Informed consent: discussed and consent obtained   Lesion destroyed using liquid nitrogen: Yes   Region frozen until ice ball extended beyond lesion: Yes   Outcome: patient tolerated procedure well with no complications   Post-procedure details: wound care instructions given   Additional details:  Prior to procedure, discussed risks of blister  formation, small wound, skin dyspigmentation, or rare scar following cryotherapy. Recommend Vaseline ointment to treated areas while healing.    Return in about 6 months (around 10/30/2023) for TBSE, HxDN.  I, Lawson Radar, CMA, am acting as scribe for Darden Dates, MD.   Documentation: I have reviewed the above documentation for accuracy and completeness, and I agree with the above.  Darden Dates, MD

## 2023-05-06 ENCOUNTER — Encounter: Payer: TRICARE For Life (TFL) | Admitting: Dermatology

## 2023-05-14 ENCOUNTER — Inpatient Hospital Stay: Payer: Medicare Other | Attending: Nurse Practitioner | Admitting: Gynecologic Oncology

## 2023-05-14 VITALS — BP 169/77 | HR 61 | Temp 97.9°F | Resp 16 | Ht 62.99 in | Wt 190.6 lb

## 2023-05-14 DIAGNOSIS — Z08 Encounter for follow-up examination after completed treatment for malignant neoplasm: Secondary | ICD-10-CM | POA: Insufficient documentation

## 2023-05-14 DIAGNOSIS — Z9221 Personal history of antineoplastic chemotherapy: Secondary | ICD-10-CM | POA: Diagnosis not present

## 2023-05-14 DIAGNOSIS — Z8542 Personal history of malignant neoplasm of other parts of uterus: Secondary | ICD-10-CM | POA: Insufficient documentation

## 2023-05-14 DIAGNOSIS — Z79811 Long term (current) use of aromatase inhibitors: Secondary | ICD-10-CM | POA: Insufficient documentation

## 2023-05-14 DIAGNOSIS — Z90722 Acquired absence of ovaries, bilateral: Secondary | ICD-10-CM | POA: Diagnosis not present

## 2023-05-14 DIAGNOSIS — Z90710 Acquired absence of both cervix and uterus: Secondary | ICD-10-CM | POA: Diagnosis not present

## 2023-05-14 DIAGNOSIS — Z85828 Personal history of other malignant neoplasm of skin: Secondary | ICD-10-CM | POA: Diagnosis not present

## 2023-05-14 DIAGNOSIS — Z923 Personal history of irradiation: Secondary | ICD-10-CM | POA: Diagnosis not present

## 2023-05-14 DIAGNOSIS — K5790 Diverticulosis of intestine, part unspecified, without perforation or abscess without bleeding: Secondary | ICD-10-CM

## 2023-05-14 DIAGNOSIS — Z170 Estrogen receptor positive status [ER+]: Secondary | ICD-10-CM | POA: Insufficient documentation

## 2023-05-14 DIAGNOSIS — R197 Diarrhea, unspecified: Secondary | ICD-10-CM

## 2023-05-14 DIAGNOSIS — C541 Malignant neoplasm of endometrium: Secondary | ICD-10-CM

## 2023-05-14 DIAGNOSIS — C50412 Malignant neoplasm of upper-outer quadrant of left female breast: Secondary | ICD-10-CM | POA: Insufficient documentation

## 2023-05-14 NOTE — Patient Instructions (Addendum)
It was good to see you today.  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   Plan to see Dr. Bertis Ruddy as scheduled in July and Dr. Roselind Messier in September 2024. Dr. Pricilla Holm will see you for follow-up in 6 months or sooner if needed.  We will reach out to [facility] about an appointment and ask about a provider for your husband as well. We will let you know what we find out.   As always, if you develop any new and concerning symptoms before your next visit, please call to see me sooner.  Symptoms to report to your health care team include vaginal bleeding, rectal bleeding, bloating, weight loss without effort, new and persistent pain, new and  persistent fatigue, new leg swelling, new masses (i.e., bumps in your neck or groin), new and persistent cough, new and persistent nausea and vomiting, change in bowel or bladder habits, and any other concerns.

## 2023-05-14 NOTE — Progress Notes (Unsigned)
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 Evaluation of the pelvis is significantly limited by dense metallic streak artifact from adjacent hip arthroplasty. Within this limitation, there is expansile, masslike, heterogeneously enhancing appearance of the endometrium, measuring at least 5.2 cm in thickness. Findings are in keeping with reported diagnosis of endometrial malignancy. 2. No evidence of lymphadenopathy or metastatic disease in the chest, abdomen, or pelvis. 3. There is a 0.4 cm fissural nodule the superior segment left lower lobe, almost certainly a benign intrapulmonary lymph node. Attention on follow-up. 4. Coronary artery disease.   10/14/2021 Surgery   TRH/BSO, right SLN biopsy, left pelvic and PA LND, peritoneal nodule biopsy, mini-lap for specimen delivery  Findings: On EUA, 8cm bulbous uterus. ON intra-abdominal entry, normal upper abdominal survey. Normal omentum, small and large bowel. Uterus 8-10cm and bulbous. Normal appearing adnexa. Mapping successful on the right, no mapping on the left. Some mildly prominent lymph nodes bilaterally in the pelvis. Small, <5 mm, nodule in the cul de sac. No obvious intra-abdominal or pelvic evidence of disease.   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 SENTINEL LYMPH NODE, RIGHT EXTERNAL ILIAC,  BIOPSY:  - Lymph node, negative for carcinoma (0/1)   B. SENTINEL LYMPH NODE, RIGHT OBTURATOR AND SURROUNDING LYMPH NODES,  BIOPSY:  - Lymph nodes, negative for carcinoma (0/5)   C. UTERUS, CERVIX, BILATERAL FALLOPIAN TUBES AND OVARIES:  - Invasive high-grade serous carcinoma  - Carcinoma focally invades into the serosal surface  - Lymphovascular invasion is present  - Benign unremarkable cervix  - Benign unremarkable bilateral fallopian tubes and ovaries  - See oncology table   D. CUL DE SAC NODULE, POSTERIOR, BIOPSY:  - Endometriosis   E. LYMPH NODE, LEFT PELVIC, BIOPSY:  - Lymph nodes, negative for carcinoma (0/3)   F. LYMPH NODE, LEFT PARAORTIC, BIOPSY:  - Lymph nodes, negative for carcinoma (0/5)   ONCOLOGY TABLE:   UTERUS, CARCINOMA OR CARCINOSARCOMA: Resection   Procedure: Total hysterectomy and bilateral salpingo-oophorectomy  Histologic Type: Serous carcinoma  Histologic Grade: High-grade  Myometrial Invasion:       Depth of Myometrial Invasion (mm): 32 mm       Myometrial Thickness (mm): 32 mm       Percentage of Myometrial Invasion: 100%  Uterine Serosa Involvement: Present, focal  Cervical stromal Involvement: Not identified  Extent of involvement of other tissue/organs: Not identified  Peritoneal/Ascitic Fluid: Negative for carcinoma  Lymphovascular Invasion: Present  Regional Lymph Nodes:       Pelvic Lymph Nodes Examined:                                   6 Sentinel  3 Non-sentinel                                   9 Total       Pelvic Lymph Nodes with Metastasis: 0                           Macrometastasis: (>2.0 mm): 0                           Micrometastasis: (>0.2 mm and < 2.0 mm): 0                           Isolated Tumor Cells (<0.2 mm): 0                           Laterality of Lymph Node with Tumor: Not  applicable                           Extracapsular Extension: Not applicable       Para-aortic Lymph  Nodes Examined:                                    0 Sentinel                                    5 Non-sentinel                                    5 Total       Para-aortic Lymph Nodes with Metastasis: 0                           Macrometastasis: (>2.0 mm): 0                           Micrometastasis:  (>0.2 mm and < 2.0 mm): 0                           Isolated Tumor Cells (<0.2 mm): 0                           Laterality of Lymph Node with Tumor: Not  applicable                           Extracapsular Extension: Not applicable  Distant Metastasis:       Distant Site(s) Involved: Not applicable  Pathologic Stage Classification (pTNM, AJCC 8th Edition): pT3a, pN0  Ancillary Studies: MMR / MSI testing will be ordered  Representative Tumor Block: C4  Comment(s): Pancytokeratin was performed on the lymph nodes and is negative.   By immunohistochemistry, HER-2 is EQUIVOCAL (2+).  HER-2 by FISH is pending and will be reported in an addendum.   FLOURESCENCE IN-SITU HYBRIDIZATION RESULTS:   GROUP 1:  HER2 **POSITIVE**   On the tissue sample received from  this individual HER2 FISH was performed by a technologist and cell imaging and analysis on the [employer].   RATIO of HER2/CEN 17 SIGNALS: 2.50  AVERAGE HER2 COPY NUMBER PER CELL: 4.74   The ratio of HER2/CEN 17 result exceeds the cutoff value of >=2.0 and a copy number of HER2 signals exceeding the cutoff range of >=4.0 signals per cell.  Arch Pathol Lab Med 1:1, 2018.    10/28/2021 Cancer Staging   Staging form: Corpus Uteri - Carcinoma and Carcinosarcoma, AJCC 8th Edition - Pathologic stage from 10/28/2021: Stage III (pT3, pN0, cM0) - Signed by Artis Delay, MD on 10/28/2021 Stage prefix: Initial diagnosis   11/07/2021 Procedure   Successful placement of a right internal jugular approach power injectable Port-A-Cath. The catheter is ready for immediate use.   11/11/2021 - 02/26/2022 Chemotherapy   Patient is on Treatment Plan : UTERINE  Carboplatin AUC 6 / Paclitaxel q21d      Genetic Testing   Negative genetic testing. No pathogenic variants identified on the [facility] Multi-Cancer+RNA panel. VUS in MET called c.3220A>G, VUS in MUTYh called c.1417G>A and VUS in POLE called c.596G>T identified. The report date is 01/20/2022.  The Multi-Cancer Panel + RNA offered by [facility] includes sequencing and/or deletion duplication testing of the following 84 genes: AIP, ALK, APC, ATM, AXIN2,BAP1,  BARD1, BLM, BMPR1A, BRCA1, BRCA2, BRIP1, CASR, CDC73, CDH1, CDK4, CDKN1B, CDKN1C, CDKN2A (p14ARF), CDKN2A (p16INK4a), CEBPA, CHEK2, CTNNA1, DICER1, DIS3L2, EGFR (c.2369C>T, p.Thr790Met variant only), EPCAM (Deletion/duplication testing only), FH, FLCN, GATA2, GPC3, GREM1 (Promoter region deletion/duplication testing only), HOXB13 (c.251G>A, p.Gly84Glu), HRAS, KIT, MAX, MEN1, MET, MITF (c.952G>A, p.Glu318Lys variant only), MLH1, MSH2, MSH3, MSH6, MUTYH, NBN, NF1, NF2, NTHL1, PALB2, PDGFRA, PHOX2B, PMS2, POLD1, POLE, POT1, PRKAR1A, PTCH1, PTEN, RAD50, RAD51C, RAD51D, RB1, RECQL4, RET, RUNX1, SDHAF2, SDHA (sequence changes only), SDHB, SDHC, SDHD, SMAD4, SMARCA4, SMARCB1, SMARCE1, STK11, SUFU, TERC, TERT, TMEM127, TP53, TSC1, TSC2, VHL, WRN and WT1.   03/30/2022 Imaging   1. Bandlike opacity in the medial left lung apex is progressive in the interval, now with nodular component measuring up to 10 mm. While likely related to evolving atelectasis or scar, consider follow-up CT chest in 3 months to reassess. 2. Interval hysterectomy. 3. No evidence for metastatic disease in the chest, abdomen, or pelvis. 4. Trace free fluid in the upper pelvis, nonspecific. Of note, inferior pelvis is essentially obscured by beam hardening artifact from bilateral hip replacement. 5. Left colonic diverticulosis without diverticulitis. 6. Moderate stool volume. Imaging features could be compatible with constipation in the appropriate clinical setting. 7. Aortic Atherosclerosis  (ICD10-I70.0).   06/26/2022 Imaging   1. Increasing conspicuity and size of a previously bandlike and now nodular area of density in the LEFT upper lobe suspicious for metastatic process. PET scan and or biopsy could be helpful for further evaluation. 2. Stable small LEFT retropectoral lymph nodes. These warrant attention on follow-up imaging based on patient history. 3. Added density throughout the LEFT breast associated with surgical clips presumably postoperative and or post treatment related. 4. Aortic atherosclerosis.   Aortic Atherosclerosis (ICD10-I70.0).   10/29/2022 Imaging   1. The previously biopsied left upper lobe nodule is not significantly changed in size. There is a new fiducial marker along the inferior aspect of this nodule and a linear tail projecting posteriorly which may reflect a mucous impacted bronchus. Consider continued CT follow-up as clinically warranted. 2. No new or enlarging pulmonary nodules. No definite evidence of metastatic disease. 3. Interval resolution of previously demonstrated asymmetric increased density  in the left breast, likely postsurgical. 4. Aortic Atherosclerosis (ICD10-I70.0) and Emphysema (ICD10-J43.9).     Breast cancer of upper-outer quadrant of left female breast (HCC)  11/19/2021 Imaging   IMPRESSION: 1. Highly suspicious mass in the left breast at 1 o'clock measuring 1.9 cm.   2.  No mammographic evidence of malignancy in the right breast   11/27/2021 Procedure   Appropriate positioning of the venous shaped biopsy marking clip at the site of biopsy in the 1 o'clock location of the LEFT breast.   12/02/2021 Initial Diagnosis   Breast cancer of upper-outer quadrant of left female breast (HCC)   12/02/2021 Cancer Staging   Staging form: Breast, AJCC 8th Edition - Clinical stage from 12/02/2021: cT1c, cN0, cM0 - Signed by Artis Delay, MD on 12/02/2021 Stage prefix: Initial diagnosis    Genetic Testing   Negative genetic testing. No  pathogenic variants identified on the [facility] Multi-Cancer+RNA panel. VUS in MET called c.3220A>G, VUS in MUTYh called c.1417G>A and VUS in POLE called c.596G>T identified. The report date is 01/20/2022.  The Multi-Cancer Panel + RNA offered by [facility] includes sequencing and/or deletion duplication testing of the following 84 genes: AIP, ALK, APC, ATM, AXIN2,BAP1,  BARD1, BLM, BMPR1A, BRCA1, BRCA2, BRIP1, CASR, CDC73, CDH1, CDK4, CDKN1B, CDKN1C, CDKN2A (p14ARF), CDKN2A (p16INK4a), CEBPA, CHEK2, CTNNA1, DICER1, DIS3L2, EGFR (c.2369C>T, p.Thr790Met variant only), EPCAM (Deletion/duplication testing only), FH, FLCN, GATA2, GPC3, GREM1 (Promoter region deletion/duplication testing only), HOXB13 (c.251G>A, p.Gly84Glu), HRAS, KIT, MAX, MEN1, MET, MITF (c.952G>A, p.Glu318Lys variant only), MLH1, MSH2, MSH3, MSH6, MUTYH, NBN, NF1, NF2, NTHL1, PALB2, PDGFRA, PHOX2B, PMS2, POLD1, POLE, POT1, PRKAR1A, PTCH1, PTEN, RAD50, RAD51C, RAD51D, RB1, RECQL4, RET, RUNX1, SDHAF2, SDHA (sequence changes only), SDHB, SDHC, SDHD, SMAD4, SMARCA4, SMARCB1, SMARCE1, STK11, SUFU, TERC, TERT, TMEM127, TP53, TSC1, TSC2, VHL, WRN and WT1.   02/23/2022 Imaging   .WHO classification is NORMAL BONE MINERAL DENSITY.    03/30/2022 -  Anti-estrogen oral therapy   She is started on Arimidex   06/16/2022 Pathology Results   FINAL MICROSCOPIC DIAGNOSIS:   A. BREAST, LEFT, LUMPECTOMY:  -  Invasive mammary carcinoma (NST)/invasive ductal carcinoma (NOS) [retained membranous e-cadherin positivity], 1.7 cm in greatest dimension, Nottingham histologic score 2 out of 3.  -  Ductal carcinoma in situ (DCIS), cribriform type, nuclear grade 2 of 3.  -  Biomarkers, performed on the biopsy specimen, ER positive (90% strong), PR positive (11 to 50% moderate), HER2/neu negative  -  Margins negative in conjunction with parts a B and C (closest margin anterior at 5 mm and superior at 9 mm all others greater than 10 mm).  pT1c pNX pM n/a   B. BREAST, LEFT  POSTERIOR MARGIN, EXCISION:  -  Benign breast tissue, negative for malignancy (new margin 1.5 cm)   C. BREAST, LEFT MEDIAL MARGIN, EXCISION:  -  Benign breast tissue, negative for malignancy (new margin 1.5 cm)   ONCOLOGY TABLE:   INVASIVE CARCINOMA OF THE BREAST:  Resection   Procedure: Lumpectomy  Specimen Laterality: Left  Histologic Type: Invasive mammary carcinoma of no special type  (NST)/invasive ductal carcinoma (NOS)  Histologic Grade:       Glandular (Acinar)/Tubular Differentiation: 3       Nuclear Pleomorphism: 2       Mitotic Rate: 1       Overall Grade: 2  Tumor Size: 1.7 x 1.5 x 1.0 cm  Ductal Carcinoma In Situ: Cribriform type, nuclear grade 2 of 3  Tumor Extent: Limited to breast parenchyma  Treatment Effect in the Breast: It is noted the patient has received  chemotherapy for endometrial carcinoma as well as antrum aromatase  inhibitor without morphologic evidence of tumor response  Margins: All margins negative for invasive carcinoma       Distance from Closest Margin (mm): 5 mm and 9 mm       Specify Closest Margin (required only if <26mm): Anterior (5) and  Superior (9)  DCIS Margins: Uninvolved by DCIS       Distance from Closest Margin (mm): 5 mm       Specify Closest Margin (required only if <58mm): Posterior  Regional Lymph Nodes: Not applicable (no lymph nodes submitted or found)       Number of Lymph Nodes Examined: 0       Number of Sentinel Nodes Examined: 0       Number of Lymph Nodes with Macrometastases (>2 mm): N/A       Number of Lymph Nodes with Micrometastases: N/A       Number of Lymph Nodes with Isolated Tumor Cells (=0.2 mm or =200  cells): N/A       Size of Largest Metastatic Deposit (mm): N/A       Extranodal Extension: N/A  Distant Metastasis:       Distant Site(s) Involved: N/A  Breast Biomarker Testing Performed on Previous Biopsy:       Testing Performed on Case Number: [id number]             Estrogen Receptor: Positive, >90%  strong             Progesterone Receptor: Positive, 11 to 50% moderate             HER2: IHC 0/3; negative             Ki-67: Not performed  Pathologic Stage Classification (pTNM, AJCC 8th Edition): pT1c, pNx  Representative Tumor Block: A5  Comment(s): [None]    06/16/2022 Surgery   Preoperative diagnosis: Left breast cancer, clinical stage I  Procedure: Left breast radioactive seed guided lumpectomy Surgeon: Dr. Harden Mo   07/09/2022 PET scan   1. Hypermetabolic apical left upper lobe nodule, worrisome for primary bronchogenic carcinoma. Metastatic disease is also possibility. 2. Mild residual hypermetabolism associated with the left breast, likely postoperative/post treatment in etiology. Difficult to exclude residual disease. 3. Punctate left renal stone.  4. Aortic atherosclerosis (ICD10-I70.0). coronary artery calcification.   07/20/2022 Pathology Results   A. LUNG, LEFT UPPER LOBE, NEEDLE CORE BIOPSY:  - Necrotizing granulomatous inflammation.  See comment.   COMMENT:   AFB, PAS-F, and GMS special stains are negative for organisms.    02/25/2023 Mammogram   MM DIAG BREAST TOMO BILATERAL  Result Date: 02/25/2023 CLINICAL DATA:  Patient is status post lumpectomy of the LEFT breast in July 2023 for [facility]. Patient could not tolerate Arimidex. Patient deferred sentinel node sampling as well as radiation. Patient with a history endometrial cancer in 2022 as well; she underwent chemotherapy for this with slight decrease size of the breast cancer prior to surgical excision. EXAM: DIGITAL DIAGNOSTIC BILATERAL MAMMOGRAM WITH TOMOSYNTHESIS TECHNIQUE: Bilateral digital diagnostic mammography and breast tomosynthesis was performed. COMPARISON:  Previous exam(s). ACR Breast Density Category c: The breasts are heterogeneously dense, which may obscure small masses. FINDINGS: There is density and architectural distortion within the LEFT breast, consistent with postsurgical changes. These are new  in comparison to prior. A questioned asymmetry in the RIGHT breast resolved with additional views, consistent with overlapping  tissue. No suspicious mass, distortion, or microcalcifications are identified to suggest presence of malignancy. IMPRESSION: No mammographic evidence of malignancy bilaterally. RECOMMENDATION: Recommend bilateral diagnostic mammogram (with RIGHT and LEFT breast ultrasound if deemed necessary) in 1 year. I have discussed the findings and recommendations with the patient. If applicable, a reminder letter will be sent to the patient regarding the next appointment. BI-RADS CATEGORY  2: Benign. Electronically Signed   By: Meda Klinefelter M.D.   On: 02/25/2023 14:01     Received vaginal brachytherapy from 12/18/2021 through 01/13/2022  Interval History: Patient reports overall doing well since her last visit. She reports having "80 year old" problems. She reports having joint issues, worsened with arimidex. Her hand joints started clicking and she stopped the arimidex. Her symptoms are slightly better but she still has stiffness and discomfort. She has an upcoming ortho appt to hopefully address this. She has neuropathy in her feet with symptoms more noticeable when walking on cold floors barefoot. No abdominal/pelvic pain reported. She reports symptoms related to her hx of diverticulosis. She is having frequent diarrhea and nausea in the am when having an empty stomach. She has thin ribbon sized stools intermittently. No rectal bleeding. Reports having a good appetite. No emesis reported. She has bloating related to what she eats. Moderate stress and urge bladder incontinence reported. She states she can wake up in the am with saturated pads. States she has taken meds in the past for this with little relief. No dysuria or hematuria. No vaginal bleeding or discharge.   She has lymphedema in lower extremities, which she wears compression stockings for. She does not get large amounts of  physical activity. She works out in the garden intermittently. She would like if she could lose some weight. She has a chronic cough that has not changed. Denies chest pain, dyspnea.   Her husband has end stage crohns disease and liver disease and is seen at [facility]. He is in and out of the hospital. She is waiting to hear from [facility] about a new patient appt for herself and would like for her husband to see someone there as well. Overall, no symptoms voiced concerning for recurrence.   Past Medical/Surgical History: Past Medical History:  Diagnosis Date   Actinic keratosis 06/04/2021   right anteromedial thigh   Anemia    Arthritis    Basal cell carcinoma 06/04/2021   right suprapubic, EDC 07/29/2021   Basal cell carcinoma 07/29/2021   left nasal ala. atypical basaloid cells   Basal cell carcinoma 03/04/2023   Right lateral cheek. Nodular. Referral to radiation oncology.   BCC (basal cell carcinoma of skin) 12/03/2022   superficial right superior shoulder  scheduled 03/04/23  ED&C   Family history of lung cancer    GERD (gastroesophageal reflux disease)    History of basal cell carcinoma 07/29/2021   left thigh, EDC at time of bx   History of radiation therapy    vaginal brachytherapy [facility] 12/18/2021-01/13/2022 Dr Antony Blackbird   HLD (hyperlipidemia)    Hypertension    Hypothyroidism    Pneumonia    HX OF YEARS AGO   PONV (postoperative nausea and vomiting)     Past Surgical History:  Procedure Laterality Date   BREAST BIOPSY Left 11/27/2021   u/s bs, venus clip,  INVASIVE MAMMARY CARCINOMA   BREAST CYST ASPIRATION     BREAST LUMPECTOMY Left 06/16/2022   BREAST LUMPECTOMY WITH RADIOACTIVE SEED LOCALIZATION Left 06/16/2022   Procedure: LEFT BREAST LUMPECTOMY  WITH RADIOACTIVE SEED LOCALIZATION;  Surgeon: Emelia Loron, MD;  Location: [facility];  Service: General;  Laterality: Left;   BRONCHIAL BIOPSY  07/10/2022   Procedure: BRONCHIAL BIOPSIES;  Surgeon: Omar Person, MD;  Location: [facility] ENDOSCOPY;  Service: Pulmonary;;   BRONCHIAL BRUSHINGS  07/10/2022   Procedure: BRONCHIAL BRUSHINGS;  Surgeon: Omar Person, MD;  Location: [facility] ENDOSCOPY;  Service: Pulmonary;;   BRONCHIAL NEEDLE ASPIRATION BIOPSY  07/10/2022   Procedure: BRONCHIAL NEEDLE ASPIRATION BIOPSIES;  Surgeon: Omar Person, MD;  Location: [facility] ENDOSCOPY;  Service: Pulmonary;;   BRONCHIAL WASHINGS  07/10/2022   Procedure: BRONCHIAL WASHINGS;  Surgeon: Omar Person, MD;  Location[facility] ENDOSCOPY;  Service: Pulmonary;;   cataract Bilateral    FIDUCIAL MARKER PLACEMENT  07/10/2022   Procedure: FIDUCIAL MARKER PLACEMENT;  Surgeon: Omar Person, MD;  Location: [facility] ENDOSCOPY;  Service: Pulmonary;;   HIP SURGERY     IR IMAGING GUIDED PORT INSERTION  11/07/2021   IR REMOVAL TUN ACCESS W/ PORT W/O FL MOD SED  01/06/2022   KNEE ARTHROSCOPY Left 2003   NECK SURGERY  1997   Fusion C3, C4, C5   ROBOTIC ASSISTED TOTAL HYSTERECTOMY WITH BILATERAL SALPINGO OOPHERECTOMY Bilateral 10/14/2021   Procedure: XI ROBOTIC ASSISTED TOTAL HYSTERECTOMY WITH BILATERAL SALPINGO OOPHORECTOMY, LYMPH NODE DISSECTION,  LAPAROTOMY;  Surgeon: Carver Fila, MD;  Location: [facility];  Service: Gynecology;  Laterality: Bilateral;   SENTINEL NODE BIOPSY N/A 10/14/2021   Procedure: SENTINEL NODE DISSECTION;  Surgeon: Carver Fila, MD;  Location: [facility];  Service: Gynecology;  Laterality: N/A;   THYROIDECTOMY  2007   TOTAL HIP ARTHROPLASTY Bilateral    Left Hip -2015, Right Hip 208   VIDEO BRONCHOSCOPY WITH RADIAL ENDOBRONCHIAL ULTRASOUND  07/10/2022   Procedure: RADIAL ENDOBRONCHIAL ULTRASOUND;  Surgeon: Omar Person, MD;  Location: [facility] ENDOSCOPY;  Service: Pulmonary;;    Family History  Problem Relation Age of Onset   Cancer Father        Lung Cancer   Lung cancer Father        smoked   Skin cancer Maternal Grandmother    Skin cancer Paternal Grandmother    Cancer Paternal Aunt         mouth   Colon cancer Neg Hx    Breast cancer Neg Hx    Ovarian cancer Neg Hx    Endometrial cancer Neg Hx    Pancreatic cancer Neg Hx    Prostate cancer Neg Hx     Social History   Socioeconomic History   Marital status: Married    Spouse name: Molly Maduro   Number of children: 2   Years of education: Not on file   Highest education level: Not on file  Occupational History   Not on file  Tobacco Use   Smoking status: Former    Packs/day: 0.50    Years: 3.00    Additional pack years: 0.00    Total pack years: 1.50    Types: Cigarettes    Quit date: 11/30/1966    Years since quitting: 56.4   Smokeless tobacco: Never  Vaping Use   Vaping Use: Never used  Substance and Sexual Activity   Alcohol use: Yes    Comment: RARE   Drug use: Never   Sexual activity: Not Currently  Other Topics Concern   Not on file  Social History Narrative   Retired Charity fundraiser   Social Determinants of Corporate investment banker Strain: Not on  file  Food Insecurity: Not on file  Transportation Needs: Not on file  Physical Activity: Not on file  Stress: Not on file  Social Connections: Not on file    Current Medications:  Current Outpatient Medications:    acetaminophen (TYLENOL) 500 MG tablet, Take 1,000 mg by mouth every 6 (six) hours as needed (pain.)., Disp: , Rfl:    Cholecalciferol (VITAMIN D3) 50 MCG (2000 UT) TABS, Take 2,000 Units by mouth in the morning., Disp: , Rfl:    levothyroxine (SYNTHROID) 125 MCG tablet, Take 125 mcg by mouth daily before breakfast., Disp: , Rfl:    magnesium oxide (MAG-OX) 400 MG tablet, Take 400 mg by mouth at bedtime., Disp: , Rfl:    Multiple Vitamin (MULTIVITAMIN WITH MINERALS) TABS tablet, Take 1 tablet by mouth in the morning., Disp: , Rfl:    Omega-3 1000 MG CAPS, Take 1,000-2,000 mg by mouth See admin instructions. Take 2 capsules by mouth in the morning & take 1 capsule by mouth at night., Disp: , Rfl:    ondansetron (ZOFRAN) 8 MG tablet, Take 1 tablet (8  mg total) by mouth every 8 (eight) hours as needed. (Patient taking differently: Take 8 mg by mouth every 8 (eight) hours as needed for nausea or vomiting.), Disp: 30 tablet, Rfl: 1   pantoprazole (PROTONIX) 20 MG tablet, Take 20 mg by mouth daily as needed for heartburn or indigestion., Disp: , Rfl:    prochlorperazine (COMPAZINE) 10 MG tablet, Take 10 mg by mouth every 6 (six) hours as needed for nausea or vomiting., Disp: , Rfl:    propranolol (INDERAL) 40 MG tablet, Take 40 mg by mouth 3 (three) times daily., Disp: , Rfl:    Simethicone 125 MG TABS, Take 250 mg by mouth 2 (two) times daily as needed (gas)., Disp: , Rfl:    temazepam (RESTORIL) 15 MG capsule, Take 15 mg by mouth at bedtime as needed for sleep., Disp: , Rfl:    gabapentin (NEURONTIN) 100 MG capsule, Take 1 capsule (100 mg total) by mouth 3 (three) times daily. (Patient not taking: Reported on 05/13/2023), Disp: 90 capsule, Rfl: 0   rosuvastatin (CRESTOR) 5 MG tablet, Take 5 mg by mouth at bedtime., Disp: , Rfl:   Review of Systems: See interval hx. ROS intake form + Fatigue, joint pain, gait problem Denies appetite changes, fevers, chills, unexplained weight changes. Denies hearing loss, neck lumps or masses, mouth sores or voice changes. +chronic cough. Denies wheezing.  Denies shortness of breath. Denies chest pain or palpitations. + nausea in the am, +diarrhea. Denies blood in stools, vomiting, or early satiety. Denies dysuria, hematuria. Denies pelvic pain, vaginal bleeding or vaginal discharge.   +joint pain +stiffness. Denies muscle pain/cramps. Denies itching, rash, or wounds. Denies dizziness or seizures. Denies swollen lymph nodes or glands, denies easy bruising or bleeding. Denies depression, confusion, or decreased concentration.  Physical Exam: BP (!) 169/77 (BP Location: Left Arm, Patient Position: Sitting)   Pulse 61   Temp 97.9 F (36.6 C)   Resp 16   Ht 5' 2.99" (1.6 m)   Wt 190 lb 9.6 oz (86.5 kg)    SpO2 99%   BMI 33.77 kg/m  General: Alert, oriented, no acute distress. HEENT: Normocephalic, atraumatic, sclera anicteric. Chest: Clear to auscultation bilaterally.  No wheezes or rhonchi. Cardiovascular: Regular rate and rhythm, no murmurs. Abdomen: Obese, soft, nontender.  Normoactive bowel sounds.  No masses or hepatosplenomegaly appreciated.  Well-healed incisions. Extremities: Grossly normal range of motion.  Warm, well perfused.  1+ edema bilaterally. Skin: No rashes or lesions noted. Lymphatics: No cervical, supraclavicular, or inguinal adenopathy. GU: Normal appearing external genitalia without erythema, excoriation, or lesions.  Speculum exam reveals mildly atrophic vaginal mucosa, radiation changes present.  No bleeding or discharge. Bimanual exam reveals no masses or nodularity.  Rectovaginal exam confirms findings.  Laboratory & Radiologic Studies: Mammogram 02/25/2023  Assessment & Plan: Kristin Carlson is a 82 y.o. 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 Completed adjuvant chemotherapy end 01/2022, VBT completed 01/13/22. MMR intact, HER2+. Genetic testing with no pathogenic mutations, VUS in MET, MUTYH, and POLE.   Patient is overall doing well and is NED on exam today. She is advised to follow up as planned in December 2024 with Dr. Pricilla Holm. She has a CT scan scheduled for July 2024 with follow up with Dr. Bertis Ruddy shortly after. We will adjust Dr. Trina Ao appointment to September.    Our office will reach out to [facility] about a referral for further evaluation. Reviewed signs and symptoms that would be concerning for cancer recurrence stressed and given in AVS. We will continue with visits alternating every 3 months.    20 minutes of total time was spent for this patient encounter, including preparation, face-to-face counseling with the patient and coordination of care, and documentation of the encounter.  Warner Mccreedy NP [facility]  Oncology

## 2023-05-18 ENCOUNTER — Telehealth: Payer: Self-pay | Admitting: *Deleted

## 2023-05-18 ENCOUNTER — Telehealth: Payer: Self-pay | Admitting: Oncology

## 2023-05-18 ENCOUNTER — Telehealth: Payer: Self-pay | Admitting: Internal Medicine

## 2023-05-18 NOTE — Telephone Encounter (Signed)
Spoke with Ms. Wojtkowski in regards to her referral from Warner Mccreedy, NP to [facility]. Patient states they just called her for an appointment. Patient also states she will ask them about her husband as well since all the physicians at the office are taking new patients with first available appts. In August. Pt has no further concerns or questions at this time.

## 2023-05-18 NOTE — Telephone Encounter (Signed)
Pt was contacted in regard to Urgent referral. Pt stated that she did not think it was urgent. Pt stated that has been having some irregular BM, taking imodium which is helping, some bloating. No abdominal pain nor nausea. Pt was scheduled to see Hyacinth Meeker PA on 06/28/2023 at 9:00 AM, Pt made aware.  Pt verbalized understanding with all questions answered.

## 2023-05-18 NOTE — Telephone Encounter (Signed)
Urgent referral in [place] for changes in bowel habits-having diarrhea, thin ribbon stools, nausea, bloating. Hx diverticulosis per pt. Hx high risk uterine cancer.  Last saw GI 6+yrs ago @ [facility].  Please review and advise urgency and scheduling.  Thanks  Dr. Leone Payor DOD 6/18/2024AM

## 2023-05-18 NOTE — Telephone Encounter (Signed)
She can see me 6/27 if she wants

## 2023-05-18 NOTE — Telephone Encounter (Signed)
Left a message regarding rescheduling follow up with Dr. Roselind Messier to 08/26/23 at 10:00.

## 2023-05-18 NOTE — Telephone Encounter (Signed)
-----   Message from Doylene Bode, NP sent at 05/18/2023  9:50 AM EDT ----- This patient states there has been a referral sent to [facility] but she has not heard about an appt. She states she has a hx of diverticulosis and she feels she is having issues related to this. Having freq diarrhea, nausea in the am  Please call [facility] and see about getting her an appt. She also wants her husband to come there as well and he has a complex hx with crohns, ostomy etc. Could you see who is seeing new complex patients and their availability as well. Thank you!

## 2023-05-19 NOTE — Telephone Encounter (Signed)
Offered pt an appt with Dr. Leone Payor on 05/27/23 but she states she cannot come due to her husband having an appt. She will keep her appt with Hyacinth Meeker PA as scheduled.

## 2023-05-24 ENCOUNTER — Encounter: Payer: Self-pay | Admitting: Orthopaedic Surgery

## 2023-05-24 ENCOUNTER — Ambulatory Visit (INDEPENDENT_AMBULATORY_CARE_PROVIDER_SITE_OTHER): Payer: Medicare Other | Admitting: Orthopaedic Surgery

## 2023-05-24 ENCOUNTER — Other Ambulatory Visit (INDEPENDENT_AMBULATORY_CARE_PROVIDER_SITE_OTHER): Payer: Medicare Other

## 2023-05-24 VITALS — Wt 194.0 lb

## 2023-05-24 DIAGNOSIS — M545 Low back pain, unspecified: Secondary | ICD-10-CM

## 2023-05-24 DIAGNOSIS — M25561 Pain in right knee: Secondary | ICD-10-CM

## 2023-05-24 DIAGNOSIS — M1712 Unilateral primary osteoarthritis, left knee: Secondary | ICD-10-CM

## 2023-05-24 DIAGNOSIS — G8929 Other chronic pain: Secondary | ICD-10-CM | POA: Diagnosis not present

## 2023-05-24 DIAGNOSIS — M25562 Pain in left knee: Secondary | ICD-10-CM

## 2023-05-24 DIAGNOSIS — M1711 Unilateral primary osteoarthritis, right knee: Secondary | ICD-10-CM

## 2023-05-24 MED ORDER — METHYLPREDNISOLONE ACETATE 40 MG/ML IJ SUSP
40.0000 mg | INTRAMUSCULAR | Status: AC | PRN
Start: 2023-05-24 — End: 2023-05-24
  Administered 2023-05-24: 40 mg via INTRA_ARTICULAR

## 2023-05-24 MED ORDER — LIDOCAINE HCL 1 % IJ SOLN
3.0000 mL | INTRAMUSCULAR | Status: AC | PRN
Start: 2023-05-24 — End: 2023-05-24
  Administered 2023-05-24: 3 mL

## 2023-05-24 MED ORDER — CELECOXIB 200 MG PO CAPS: 200.0000 mg | ORAL_CAPSULE | Freq: Two times a day (BID) | ORAL | 3 refills | Status: DC | PRN

## 2023-05-24 NOTE — Progress Notes (Addendum)
The patient is an 82 year old female that I am seeing for the first time.  She comes in with multiple complaints with mainly her knees being an issue with her left knee worse than the right for many years now.  She has had chronic low back pain.  She has a history of a cervical fusion at some point.  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  I told her that we would focus on her knees mainly today but also her back.  She is with her husband.  She does not walk with an assistive device.  She is not a diabetic.  I was able to review with all of her notes and medications within epic.  She is on chronic Neurontin but at a low dose of 100 mg.  She does not take anti-inflammatories regularly.  She does report a remote history of knee arthroscopy on the left knee with a significant medial meniscectomy.  Both knees click with flexion and extension with patellofemoral grinding and crepitation.  Both knees have varus malalignment that is correctable.  Both knees have significant medial joint line tenderness worse on the left than the right.  X-rays of both knees show significant tricompartment arthritis worse on the left and the right knee.  The left knee has bone-on-bone wear the medial compartment and patellofemoral joint with large osteophytes.  Her lumbar spine x-rays show degenerative changes at multiple levels of the lumbar spine.  Her back exam shows pain with flexion and extension both medial and lateral as well.  I did offer steroid injection in both knees today since he has never had an injection at all.  She may be a good candidate later for hyaluronic acid.  She would like to stay as conservative as possible.  I gave her handouts for knee conditioning and back conditioning exercises for her lumbar spine.  I would like to see her back in 6 weeks to see how she is doing overall and can see her for her hand also at that visit.  I recommended that she at  least get an appoint with Dr. Christell Constant in the office to take a look at her cervical spine.  We did not x-ray her neck today.  She did tolerate steroid injections in both knees today as well.  I offered her physical therapy but she would like to try some of these things on her own so I gave her the handouts.  All questions and concerns were addressed and answered.     Procedure Note  Patient: Kristin Carlson             Date of Birth: 1941/01/23           MRN: [id number]             Visit Date: 05/24/2023  Procedures: Visit Diagnoses:  1. Chronic pain of left knee   2. Chronic pain of right knee   3. Chronic bilateral low back pain, unspecified whether sciatica present   4. Unilateral primary osteoarthritis, left knee   5. Unilateral primary osteoarthritis, right knee     Large Joint Inj: R knee on 05/24/2023 10:41 AM Indications: diagnostic evaluation and pain Details: 22 G 1.5 in needle, superolateral approach  Arthrogram: No  Medications: 3 mL lidocaine 1 %; 40 mg methylPREDNISolone acetate 40 MG/ML Outcome: tolerated well, no immediate complications Procedure, treatment alternatives, risks and benefits explained, specific risks discussed. Consent was given by the patient.  Immediately prior to procedure a time out was called to verify the correct patient, procedure, equipment, support staff and site/side marked as required. Patient was prepped and draped in the usual sterile fashion.    Large Joint Inj: L knee on 05/24/2023 10:41 AM Indications: diagnostic evaluation and pain Details: 22 G 1.5 in needle, superolateral approach  Arthrogram: No  Medications: 3 mL lidocaine 1 %; 40 mg methylPREDNISolone acetate 40 MG/ML Outcome: tolerated well, no immediate complications Procedure, treatment alternatives, risks and benefits explained, specific risks discussed. Consent was given by the patient. Immediately prior to procedure a time out was called to verify the correct patient, procedure,  equipment, support staff and site/side marked as required. Patient was prepped and draped in the usual sterile fashion.

## 2023-06-08 ENCOUNTER — Other Ambulatory Visit: Payer: Self-pay

## 2023-06-08 ENCOUNTER — Inpatient Hospital Stay: Payer: Medicare Other | Attending: Nurse Practitioner

## 2023-06-08 ENCOUNTER — Encounter (HOSPITAL_COMMUNITY): Payer: Self-pay

## 2023-06-08 ENCOUNTER — Ambulatory Visit (HOSPITAL_COMMUNITY)
Admission: RE | Admit: 2023-06-08 | Discharge: 2023-06-08 | Disposition: A | Discharge status: Home / Self Care | Payer: Medicare Other | Source: Ambulatory Visit | Attending: Hematology and Oncology | Admitting: Hematology and Oncology

## 2023-06-08 DIAGNOSIS — C541 Malignant neoplasm of endometrium: Secondary | ICD-10-CM | POA: Diagnosis present

## 2023-06-08 DIAGNOSIS — C50412 Malignant neoplasm of upper-outer quadrant of left female breast: Secondary | ICD-10-CM | POA: Diagnosis not present

## 2023-06-08 DIAGNOSIS — Z08 Encounter for follow-up examination after completed treatment for malignant neoplasm: Secondary | ICD-10-CM | POA: Insufficient documentation

## 2023-06-08 DIAGNOSIS — Z853 Personal history of malignant neoplasm of breast: Secondary | ICD-10-CM | POA: Insufficient documentation

## 2023-06-08 DIAGNOSIS — Z8542 Personal history of malignant neoplasm of other parts of uterus: Secondary | ICD-10-CM | POA: Insufficient documentation

## 2023-06-08 LAB — COMPREHENSIVE METABOLIC PANEL
ALT: 15 U/L (ref 0–44)
AST: 19 U/L (ref 15–41)
Albumin: 3.7 g/dL (ref 3.5–5.0)
Alkaline Phosphatase: 80 U/L (ref 38–126)
Anion gap: 6 (ref 5–15)
BUN: 23 mg/dL (ref 8–23)
CO2: 28 mmol/L (ref 22–32)
Calcium: 9.3 mg/dL (ref 8.9–10.3)
Chloride: 106 mmol/L (ref 98–111)
Creatinine, Ser: 0.98 mg/dL (ref 0.44–1.00)
GFR, Estimated: 58 mL/min — ABNORMAL LOW (ref >60)
Glucose, Bld: 79 mg/dL (ref 70–99)
Potassium: 4.4 mmol/L (ref 3.5–5.1)
Sodium: 140 mmol/L (ref 135–145)
Total Bilirubin: 0.5 mg/dL (ref 0.3–1.2)
Total Protein: 6.8 g/dL (ref 6.5–8.1)

## 2023-06-08 LAB — CBC WITH DIFFERENTIAL/PLATELET
Abs Immature Granulocytes: 0.01 10*3/uL (ref 0.00–0.07)
Basophils Absolute: 0 10*3/uL (ref 0.0–0.1)
Basophils Relative: 1 %
Eosinophils Absolute: 0.1 10*3/uL (ref 0.0–0.5)
Eosinophils Relative: 3 %
HCT: 41.3 % (ref 36.0–46.0)
Hemoglobin: 13.7 g/dL (ref 12.0–15.0)
Immature Granulocytes: 0 %
Lymphocytes Relative: 26 %
Lymphs Abs: 1.3 10*3/uL (ref 0.7–4.0)
MCH: 30.5 pg (ref 26.0–34.0)
MCHC: 33.2 g/dL (ref 30.0–36.0)
MCV: 92 fL (ref 80.0–100.0)
Monocytes Absolute: 0.5 10*3/uL (ref 0.1–1.0)
Monocytes Relative: 9 %
Neutro Abs: 3 10*3/uL (ref 1.7–7.7)
Neutrophils Relative %: 61 %
Platelets: 173 10*3/uL (ref 150–400)
RBC: 4.49 MIL/uL (ref 3.87–5.11)
RDW: 13.3 % (ref 11.5–15.5)
WBC: 5 10*3/uL (ref 4.0–10.5)
nRBC: 0 % (ref 0.0–0.2)

## 2023-06-08 MED ORDER — SODIUM CHLORIDE (PF) 0.9 % IJ SOLN
INTRAMUSCULAR | Status: AC
  Filled 2023-06-08: qty 50

## 2023-06-08 MED ORDER — IOHEXOL 300 MG/ML  SOLN
100.0000 mL | Freq: Once | INTRAMUSCULAR | Status: AC | PRN
  Administered 2023-06-08: 100 mL via INTRAVENOUS

## 2023-06-10 ENCOUNTER — Inpatient Hospital Stay: Payer: Medicare Other | Admitting: Hematology and Oncology

## 2023-06-10 ENCOUNTER — Other Ambulatory Visit: Payer: Self-pay

## 2023-06-10 ENCOUNTER — Encounter: Payer: Self-pay | Admitting: Hematology and Oncology

## 2023-06-10 VITALS — BP 141/58 | HR 56 | Resp 18 | Ht 62.99 in | Wt 191.2 lb

## 2023-06-10 DIAGNOSIS — Z853 Personal history of malignant neoplasm of breast: Secondary | ICD-10-CM | POA: Diagnosis not present

## 2023-06-10 DIAGNOSIS — Z08 Encounter for follow-up examination after completed treatment for malignant neoplasm: Secondary | ICD-10-CM | POA: Diagnosis not present

## 2023-06-10 DIAGNOSIS — C50412 Malignant neoplasm of upper-outer quadrant of left female breast: Secondary | ICD-10-CM | POA: Diagnosis not present

## 2023-06-10 DIAGNOSIS — C541 Malignant neoplasm of endometrium: Secondary | ICD-10-CM

## 2023-06-10 DIAGNOSIS — Z8542 Personal history of malignant neoplasm of other parts of uterus: Secondary | ICD-10-CM | POA: Diagnosis present

## 2023-06-10 DIAGNOSIS — R911 Solitary pulmonary nodule: Secondary | ICD-10-CM

## 2023-06-10 DIAGNOSIS — R5383 Other fatigue: Secondary | ICD-10-CM | POA: Insufficient documentation

## 2023-06-10 NOTE — Assessment & Plan Note (Signed)
CT imaging show no evidence of disease I plan to repeat imaging study 1 more time in 6 months and if that CT imaging was negative for recurrence, we will space out her future appointment and discontinue surveillance imaging

## 2023-06-10 NOTE — Assessment & Plan Note (Signed)
She has discontinued aromatase inhibitor due to side effects and is feeling better She will continue surveillance imaging mammogram, due in March of next year

## 2023-06-10 NOTE — Assessment & Plan Note (Signed)
She had history of pulmonary nodules This is not malignant but could be related to her pet bird Observe only

## 2023-06-10 NOTE — Progress Notes (Signed)
[facility] OFFICE PROGRESS NOTE  Patient Care Team: Olena Leatherwood, FNP as PCP - General (Nurse Practitioner)  ASSESSMENT & PLAN:  Endometrial cancer [facility]) CT imaging show no evidence of disease I plan to repeat imaging study 1 more time in 6 months and if that CT imaging was negative for recurrence, we will space out her future appointment and discontinue surveillance imaging  Breast cancer of upper-outer quadrant of left female breast [facility]) She has discontinued aromatase inhibitor due to side effects and is feeling better She will continue surveillance imaging mammogram, due in March of next year  Pulmonary nodule seen on imaging study She had history of pulmonary nodules This is not malignant but could be related to her pet bird Observe only  Other fatigue She has chronic fatigue She has very poor sleep, sleep disturbance due to waking up and unrestful sleep She have hypothyroidism but is currently taking Synthroid We discussed importance of staying active and avoiding afternoon naps if possible  No orders of the defined types were placed in this encounter.   All questions were answered. The patient knows to call the clinic with any problems, questions or concerns. The total time spent in the appointment was 30 minutes encounter with patients including review of chart and various tests results, discussions about plan of care and coordination of care plan   Artis Delay, MD 06/10/2023 11:28 AM  INTERVAL HISTORY: Please see below for problem oriented charting. she returns for surveillance follow-up She is doing well except for excessive fatigue She continues to have intermittent hot flashes despite stopping aromatase inhibitor  REVIEW OF SYSTEMS:   Constitutional: Denies fevers, chills or abnormal weight loss Eyes: Denies blurriness of vision Ears, nose, mouth, throat, and face: Denies mucositis or sore throat Respiratory: Denies cough, dyspnea or  wheezes Cardiovascular: Denies palpitation, chest discomfort or lower extremity swelling Gastrointestinal:  Denies nausea, heartburn or change in bowel habits Skin: Denies abnormal skin rashes Lymphatics: Denies new lymphadenopathy or easy bruising Neurological:Denies numbness, tingling or new weaknesses Behavioral/Psych: Mood is stable, no new changes  All other systems were reviewed with the patient and are negative.  I have reviewed the past medical history, past surgical history, social history and family history with the patient and they are unchanged from previous note.  ALLERGIES:  is allergic to lactose, codeine, penicillins, sulfa antibiotics, and vancomycin.  MEDICATIONS:  Current Outpatient Medications  Medication Sig Dispense Refill   acetaminophen (TYLENOL) 500 MG tablet Take 1,000 mg by mouth every 6 (six) hours as needed (pain.).     celecoxib (CELEBREX) 200 MG capsule Take 1 capsule (200 mg total) by mouth 2 (two) times daily between meals as needed. 60 capsule 3   Cholecalciferol (VITAMIN D3) 50 MCG (2000 UT) TABS Take 2,000 Units by mouth in the morning.     levothyroxine (SYNTHROID) 125 MCG tablet Take 125 mcg by mouth daily before breakfast.     magnesium oxide (MAG-OX) 400 MG tablet Take 400 mg by mouth at bedtime.     Multiple Vitamin (MULTIVITAMIN WITH MINERALS) TABS tablet Take 1 tablet by mouth in the morning.     Omega-3 1000 MG CAPS Take 1,000-2,000 mg by mouth See admin instructions. Take 2 capsules by mouth in the morning & take 1 capsule by mouth at night.     pantoprazole (PROTONIX) 20 MG tablet Take 20 mg by mouth daily as needed for heartburn or indigestion.     propranolol (INDERAL) 40 MG tablet Take  40 mg by mouth 3 (three) times daily.     rosuvastatin (CRESTOR) 5 MG tablet Take 5 mg by mouth at bedtime.     Simethicone 125 MG TABS Take 250 mg by mouth 2 (two) times daily as needed (gas).     temazepam (RESTORIL) 15 MG capsule Take 15 mg by mouth at  bedtime as needed for sleep.     No current facility-administered medications for this visit.    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 Evaluation of the pelvis is significantly limited by dense metallic streak artifact from adjacent hip arthroplasty. Within this limitation, there is expansile, masslike, heterogeneously enhancing appearance of the endometrium, measuring at least 5.2 cm in thickness. Findings are in keeping with reported diagnosis of endometrial malignancy. 2. No evidence of lymphadenopathy or metastatic disease in the chest, abdomen, or pelvis. 3. There is a 0.4 cm fissural nodule the superior segment left lower lobe, almost certainly a benign intrapulmonary lymph node. Attention on follow-up. 4. Coronary artery disease.   10/14/2021 Surgery   TRH/BSO, right SLN biopsy, left pelvic and PA LND, peritoneal nodule biopsy, mini-lap for specimen delivery  Findings: On EUA, 8cm bulbous uterus. ON intra-abdominal entry, normal upper abdominal survey. Normal omentum, small and large bowel. Uterus 8-10cm and bulbous. Normal appearing adnexa. Mapping successful on the right, no mapping on the left. Some mildly prominent lymph nodes bilaterally in the pelvis. Small, <5 mm, nodule in the cul de sac. No obvious intra-abdominal or pelvic evidence of disease.   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 SENTINEL LYMPH NODE, RIGHT EXTERNAL ILIAC, BIOPSY:  - Lymph node, negative  for carcinoma (0/1)   B. SENTINEL LYMPH NODE, RIGHT OBTURATOR AND SURROUNDING LYMPH NODES,  BIOPSY:  - Lymph nodes, negative for carcinoma (0/5)   C. UTERUS, CERVIX, BILATERAL FALLOPIAN TUBES AND OVARIES:  - Invasive high-grade serous carcinoma  - Carcinoma focally invades into the serosal surface  - Lymphovascular invasion is present  - Benign unremarkable cervix  - Benign unremarkable bilateral fallopian tubes and ovaries  - See oncology table   D. CUL DE SAC NODULE, POSTERIOR, BIOPSY:  - Endometriosis   E. LYMPH NODE, LEFT PELVIC, BIOPSY:  - Lymph nodes, negative for carcinoma (0/3)   F. LYMPH NODE, LEFT PARAORTIC, BIOPSY:  - Lymph nodes, negative for carcinoma (0/5)   ONCOLOGY TABLE:   UTERUS, CARCINOMA OR CARCINOSARCOMA: Resection   Procedure: Total hysterectomy and bilateral salpingo-oophorectomy  Histologic Type: Serous carcinoma  Histologic Grade: High-grade  Myometrial Invasion:       Depth of Myometrial Invasion (mm): 32 mm       Myometrial Thickness (mm): 32 mm       Percentage of Myometrial Invasion: 100%  Uterine Serosa Involvement: Present, focal  Cervical stromal Involvement: Not identified  Extent of involvement of other tissue/organs: Not identified  Peritoneal/Ascitic Fluid: Negative for carcinoma  Lymphovascular Invasion: Present  Regional Lymph Nodes:       Pelvic Lymph Nodes Examined:  6 Sentinel                                   3 Non-sentinel                                   9 Total       Pelvic Lymph Nodes with Metastasis: 0                           Macrometastasis: (>2.0 mm): 0                           Micrometastasis: (>0.2 mm and < 2.0 mm): 0                           Isolated Tumor Cells (<0.2 mm): 0                           Laterality of Lymph Node with Tumor: Not  applicable                           Extracapsular Extension: Not applicable       Para-aortic Lymph Nodes Examined:                                     0 Sentinel                                    5 Non-sentinel                                    5 Total       Para-aortic Lymph Nodes with Metastasis: 0                           Macrometastasis: (>2.0 mm): 0                           Micrometastasis:  (>0.2 mm and < 2.0 mm): 0                           Isolated Tumor Cells (<0.2 mm): 0                           Laterality of Lymph Node with Tumor: Not  applicable                           Extracapsular Extension: Not applicable  Distant Metastasis:       Distant Site(s) Involved: Not applicable  Pathologic Stage Classification (pTNM, AJCC 8th Edition): pT3a, pN0  Ancillary Studies: MMR / MSI testing will be ordered  Representative Tumor Block: C4  Comment(s): Pancytokeratin was performed on the lymph nodes and is negative.   By immunohistochemistry, HER-2 is  EQUIVOCAL (2+).  HER-2 by FISH is pending and will be reported in an addendum.   FLOURESCENCE IN-SITU HYBRIDIZATION RESULTS:   GROUP 1:  HER2 **POSITIVE**   On the tissue sample received from this individual HER2 FISH was performed by a technologist and cell imaging and analysis on the [employer].   RATIO of HER2/CEN 17 SIGNALS: 2.50  AVERAGE HER2 COPY NUMBER PER CELL: 4.74   The ratio of HER2/CEN 17 result exceeds the cutoff value of >=2.0 and a copy number of HER2 signals exceeding the cutoff range of >=4.0 signals per cell.  Arch Pathol Lab Med 1:1, 2018.    10/28/2021 Cancer Staging   Staging form: Corpus Uteri - Carcinoma and Carcinosarcoma, AJCC 8th Edition - Pathologic stage from 10/28/2021: Stage III (pT3, pN0, cM0) - Signed by Artis Delay, MD on 10/28/2021 Stage prefix: Initial diagnosis   11/07/2021 Procedure   Successful placement of a right internal jugular approach power injectable Port-A-Cath. The catheter is ready for immediate use.   11/11/2021 - 02/26/2022 Chemotherapy   Patient is on Treatment Plan : UTERINE Carboplatin AUC 6 / Paclitaxel q21d       Genetic Testing   Negative genetic testing. No pathogenic variants identified on the [facility] Multi-Cancer+RNA panel. VUS in MET called c.3220A>G, VUS in MUTYh called c.1417G>A and VUS in POLE called c.596G>T identified. The report date is 01/20/2022.  The Multi-Cancer Panel + RNA offered by [facility] includes sequencing and/or deletion duplication testing of the following 84 genes: AIP, ALK, APC, ATM, AXIN2,BAP1,  BARD1, BLM, BMPR1A, BRCA1, BRCA2, BRIP1, CASR, CDC73, CDH1, CDK4, CDKN1B, CDKN1C, CDKN2A (p14ARF), CDKN2A (p16INK4a), CEBPA, CHEK2, CTNNA1, DICER1, DIS3L2, EGFR (c.2369C>T, p.Thr790Met variant only), EPCAM (Deletion/duplication testing only), FH, FLCN, GATA2, GPC3, GREM1 (Promoter region deletion/duplication testing only), HOXB13 (c.251G>A, p.Gly84Glu), HRAS, KIT, MAX, MEN1, MET, MITF (c.952G>A, p.Glu318Lys variant only), MLH1, MSH2, MSH3, MSH6, MUTYH, NBN, NF1, NF2, NTHL1, PALB2, PDGFRA, PHOX2B, PMS2, POLD1, POLE, POT1, PRKAR1A, PTCH1, PTEN, RAD50, RAD51C, RAD51D, RB1, RECQL4, RET, RUNX1, SDHAF2, SDHA (sequence changes only), SDHB, SDHC, SDHD, SMAD4, SMARCA4, SMARCB1, SMARCE1, STK11, SUFU, TERC, TERT, TMEM127, TP53, TSC1, TSC2, VHL, WRN and WT1.   03/30/2022 Imaging   1. Bandlike opacity in the medial left lung apex is progressive in the interval, now with nodular component measuring up to 10 mm. While likely related to evolving atelectasis or scar, consider follow-up CT chest in 3 months to reassess. 2. Interval hysterectomy. 3. No evidence for metastatic disease in the chest, abdomen, or pelvis. 4. Trace free fluid in the upper pelvis, nonspecific. Of note, inferior pelvis is essentially obscured by beam hardening artifact from bilateral hip replacement. 5. Left colonic diverticulosis without diverticulitis. 6. Moderate stool volume. Imaging features could be compatible with constipation in the appropriate clinical setting. 7. Aortic Atherosclerosis (ICD10-I70.0).   06/26/2022 Imaging   1.  Increasing conspicuity and size of a previously bandlike and now nodular area of density in the LEFT upper lobe suspicious for metastatic process. PET scan and or biopsy could be helpful for further evaluation. 2. Stable small LEFT retropectoral lymph nodes. These warrant attention on follow-up imaging based on patient history. 3. Added density throughout the LEFT breast associated with surgical clips presumably postoperative and or post treatment related. 4. Aortic atherosclerosis.   Aortic Atherosclerosis (ICD10-I70.0).   10/29/2022 Imaging   1. The previously biopsied left upper lobe nodule is not significantly changed in size. There is a new fiducial marker along the inferior aspect of this nodule and a linear tail projecting posteriorly  which may reflect a mucous impacted bronchus. Consider continued CT follow-up as clinically warranted. 2. No new or enlarging pulmonary nodules. No definite evidence of metastatic disease. 3. Interval resolution of previously demonstrated asymmetric increased density in the left breast, likely postsurgical. 4. Aortic Atherosclerosis (ICD10-I70.0) and Emphysema (ICD10-J43.9).     06/10/2023 Imaging   CT CHEST ABDOMEN PELVIS W CONTRAST  Result Date: 06/09/2023 CLINICAL DATA:  Cervical cancer, assess treatment response, breast cancer * Tracking Code: BO * EXAM: CT CHEST, ABDOMEN, AND PELVIS WITH CONTRAST TECHNIQUE: Multidetector CT imaging of the chest, abdomen and pelvis was performed following the standard protocol during bolus administration of intravenous contrast. RADIATION DOSE REDUCTION: This exam was performed according to the departmental dose-optimization program which includes automated exposure control, adjustment of the mA and/or kV according to patient size and/or use of iterative reconstruction technique. CONTRAST:  OMNIPAQUE IOHEXOL 300 MG/ML  SOLN COMPARISON:  CT chest, 10/27/2022, CT chest abdomen pelvis, 03/27/2022 PET-CT, 07/08/2022  FINDINGS: CT CHEST FINDINGS Cardiovascular: Aortic atherosclerosis. Normal heart size. Left coronary artery calcifications. No pericardial effusion. Mediastinum/Nodes: No enlarged mediastinal, hilar, or axillary lymph nodes. Thyroid gland, trachea, and esophagus demonstrate no significant findings. Lungs/Pleura: Unchanged bandlike scarring and consolidation of the medial left apex containing a biopsy marking clip (series 4, image 116). Unchanged, bland appearing bandlike scarring of the left lung base (series 4, image 95). No pleural effusion or pneumothorax. Musculoskeletal: Status post left lumpectomy. No acute osseous findings. CT ABDOMEN PELVIS FINDINGS Hepatobiliary: No solid liver abnormality is seen. Contracted gallbladder. No gallstones, gallbladder wall thickening, or biliary dilatation. Pancreas: Unremarkable. No pancreatic ductal dilatation or surrounding inflammatory changes. Spleen: Normal in size without significant abnormality. Adrenals/Urinary Tract: Adrenal glands are unremarkable. Kidneys are normal, without renal calculi, solid lesion, or hydronephrosis. Evaluation of the bladder is significantly limited by dense metallic streak artifact from adjacent bilateral hip total arthroplasty. No obvious abnormality. Stomach/Bowel: Stomach is within normal limits. Appendix appears normal. No evidence of bowel wall thickening, distention, or inflammatory changes. Pancolonic diverticulosis severe in the sigmoid. Vascular/Lymphatic: Aortic atherosclerosis. No enlarged abdominal or pelvic lymph nodes. Reproductive: Status post hysterectomy. Evaluation of the low pelvis is significantly limited by dense metallic streak artifact from bilateral hip arthroplasty. Other: No abdominal wall hernia or abnormality. No ascites. Musculoskeletal: No acute osseous findings. Status post bilateral hip total arthroplasty with dense metallic streak artifact. IMPRESSION: 1. Status post hysterectomy. Evaluation of the low pelvis  is significantly limited by dense metallic streak artifact from bilateral hip arthroplasty. Within this limitation, no obvious recurrent or metastatic disease in the pelvis. 2. No evidence of lymphadenopathy or metastatic disease in the chest, abdomen, or pelvis. 3. Unchanged bandlike scarring and consolidation of the medial left apex containing a biopsy marking clip, previously biopsied with reported benign infectious pathology. 4. Pancolonic diverticulosis. 5. Coronary artery disease. Aortic Atherosclerosis (ICD10-I70.0). Electronically Signed   By: Jearld Lesch M.D.   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  There are osteophytes in all 3 compartments and varus malalignment.  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  There is varus malalignment.  XR Lumbar Spine 2-3 Views  Result Date: 05/24/2023 2 views of the lumbar spine on show a degenerative scoliosis and degenerative changes  at multiple levels.  There is no acute findings otherwise.     Breast cancer of upper-outer quadrant of left female breast (HCC)  11/19/2021 Imaging   IMPRESSION: 1. Highly suspicious mass in the left breast at 1 o'clock measuring 1.9 cm.   2.  No mammographic evidence of malignancy in the right breast   11/27/2021 Procedure   Appropriate positioning of the venous shaped biopsy marking clip at the site of biopsy in the 1 o'clock location of the LEFT breast.   12/02/2021 Initial Diagnosis   Breast cancer of upper-outer quadrant of left female breast (HCC)   12/02/2021 Cancer Staging   Staging form: Breast, AJCC 8th Edition - Clinical stage from 12/02/2021: cT1c, cN0, cM0 - Signed by Artis Delay, MD on 12/02/2021 Stage prefix:  Initial diagnosis    Genetic Testing   Negative genetic testing. No pathogenic variants identified on the [facility] Multi-Cancer+RNA panel. VUS in MET called c.3220A>G, VUS in MUTYh called c.1417G>A and VUS in POLE called c.596G>T identified. The report date is 01/20/2022.  The Multi-Cancer Panel + RNA offered by [facility] includes sequencing and/or deletion duplication testing of the following 84 genes: AIP, ALK, APC, ATM, AXIN2,BAP1,  BARD1, BLM, BMPR1A, BRCA1, BRCA2, BRIP1, CASR, CDC73, CDH1, CDK4, CDKN1B, CDKN1C, CDKN2A (p14ARF), CDKN2A (p16INK4a), CEBPA, CHEK2, CTNNA1, DICER1, DIS3L2, EGFR (c.2369C>T, p.Thr790Met variant only), EPCAM (Deletion/duplication testing only), FH, FLCN, GATA2, GPC3, GREM1 (Promoter region deletion/duplication testing only), HOXB13 (c.251G>A, p.Gly84Glu), HRAS, KIT, MAX, MEN1, MET, MITF (c.952G>A, p.Glu318Lys variant only), MLH1, MSH2, MSH3, MSH6, MUTYH, NBN, NF1, NF2, NTHL1, PALB2, PDGFRA, PHOX2B, PMS2, POLD1, POLE, POT1, PRKAR1A, PTCH1, PTEN, RAD50, RAD51C, RAD51D, RB1, RECQL4, RET, RUNX1, SDHAF2, SDHA (sequence changes only), SDHB, SDHC, SDHD, SMAD4, SMARCA4, SMARCB1, SMARCE1, STK11, SUFU, TERC, TERT, TMEM127, TP53, TSC1, TSC2, VHL, WRN and WT1.   02/23/2022 Imaging   .WHO classification is NORMAL BONE MINERAL DENSITY.    03/30/2022 -  Anti-estrogen oral therapy   She is started on Arimidex   06/16/2022 Pathology Results   FINAL MICROSCOPIC DIAGNOSIS:   A. BREAST, LEFT, LUMPECTOMY:  -  Invasive mammary carcinoma (NST)/invasive ductal carcinoma (NOS) [retained membranous e-cadherin positivity], 1.7 cm in greatest dimension, Nottingham histologic score 2 out of 3.  -  Ductal carcinoma in situ (DCIS), cribriform type, nuclear grade 2 of 3.  -  Biomarkers, performed on the biopsy specimen, ER positive (90% strong), PR positive (11 to 50% moderate), HER2/neu negative  -  Margins negative in conjunction with parts a B and C (closest margin anterior at 5 mm and superior at 9 mm  all others greater than 10 mm).  pT1c pNX pM n/a   B. BREAST, LEFT POSTERIOR MARGIN, EXCISION:  -  Benign breast tissue, negative for malignancy (new margin 1.5 cm)   C. BREAST, LEFT MEDIAL MARGIN, EXCISION:  -  Benign breast tissue, negative for malignancy (new margin 1.5 cm)   ONCOLOGY TABLE:   INVASIVE CARCINOMA OF THE BREAST:  Resection   Procedure: Lumpectomy  Specimen Laterality: Left  Histologic Type: Invasive mammary carcinoma of no special type  (NST)/invasive ductal carcinoma (NOS)  Histologic Grade:       Glandular (Acinar)/Tubular Differentiation: 3       Nuclear Pleomorphism: 2       Mitotic Rate: 1       Overall Grade: 2  Tumor Size: 1.7 x 1.5 x 1.0 cm  Ductal Carcinoma In Situ: Cribriform type, nuclear grade 2 of 3  Tumor Extent: Limited to breast parenchyma  Treatment Effect in  the Breast: It is noted the patient has received  chemotherapy for endometrial carcinoma as well as antrum aromatase  inhibitor without morphologic evidence of tumor response  Margins: All margins negative for invasive carcinoma       Distance from Closest Margin (mm): 5 mm and 9 mm       Specify Closest Margin (required only if <29mm): Anterior (5) and  Superior (9)  DCIS Margins: Uninvolved by DCIS       Distance from Closest Margin (mm): 5 mm       Specify Closest Margin (required only if <56mm): Posterior  Regional Lymph Nodes: Not applicable (no lymph nodes submitted or found)       Number of Lymph Nodes Examined: 0       Number of Sentinel Nodes Examined: 0       Number of Lymph Nodes with Macrometastases (>2 mm): N/A       Number of Lymph Nodes with Micrometastases: N/A       Number of Lymph Nodes with Isolated Tumor Cells (=0.2 mm or =200  cells): N/A       Size of Largest Metastatic Deposit (mm): N/A       Extranodal Extension: N/A  Distant Metastasis:       Distant Site(s) Involved: N/A  Breast Biomarker Testing Performed on Previous Biopsy:       Testing Performed on  Case Number: [id number]             Estrogen Receptor: Positive, >90% strong             Progesterone Receptor: Positive, 11 to 50% moderate             HER2: IHC 0/3; negative             Ki-67: Not performed  Pathologic Stage Classification (pTNM, AJCC 8th Edition): pT1c, pNx  Representative Tumor Block: A5  Comment(s): [None]    06/16/2022 Surgery   Preoperative diagnosis: Left breast cancer, clinical stage I  Procedure: Left breast radioactive seed guided lumpectomy Surgeon: Dr. Harden Mo   07/09/2022 PET scan   1. Hypermetabolic apical left upper lobe nodule, worrisome for primary bronchogenic carcinoma. Metastatic disease is also possibility. 2. Mild residual hypermetabolism associated with the left breast, likely postoperative/post treatment in etiology. Difficult to exclude residual disease. 3. Punctate left renal stone.  4. Aortic atherosclerosis (ICD10-I70.0). coronary artery calcification.   07/20/2022 Pathology Results   A. LUNG, LEFT UPPER LOBE, NEEDLE CORE BIOPSY:  - Necrotizing granulomatous inflammation.  See comment.   COMMENT:   AFB, PAS-F, and GMS special stains are negative for organisms.    02/25/2023 Mammogram   MM DIAG BREAST TOMO BILATERAL  Result Date: 02/25/2023 CLINICAL DATA:  Patient is status post lumpectomy of the LEFT breast in July 2023 for [facility]. Patient could not tolerate Arimidex. Patient deferred sentinel node sampling as well as radiation. Patient with a history endometrial cancer in 2022 as well; she underwent chemotherapy for this with slight decrease size of the breast cancer prior to surgical excision. EXAM: DIGITAL DIAGNOSTIC BILATERAL MAMMOGRAM WITH TOMOSYNTHESIS TECHNIQUE: Bilateral digital diagnostic mammography and breast tomosynthesis was performed. COMPARISON:  Previous exam(s). ACR Breast Density Category c: The breasts are heterogeneously dense, which may obscure small masses. FINDINGS: There is density and architectural distortion  within the LEFT breast, consistent with postsurgical changes. These are new in comparison to prior. A questioned asymmetry in the RIGHT breast resolved with additional views, consistent with overlapping tissue. No suspicious  mass, distortion, or microcalcifications are identified to suggest presence of malignancy. IMPRESSION: No mammographic evidence of malignancy bilaterally. RECOMMENDATION: Recommend bilateral diagnostic mammogram (with RIGHT and LEFT breast ultrasound if deemed necessary) in 1 year. I have discussed the findings and recommendations with the patient. If applicable, a reminder letter will be sent to the patient regarding the next appointment. BI-RADS CATEGORY  2: Benign. Electronically Signed   By: Meda Klinefelter M.D.   On: 02/25/2023 14:01       PHYSICAL EXAMINATION: ECOG PERFORMANCE STATUS: 1 - Symptomatic but completely ambulatory  Vitals:   06/10/23 1110  BP: (!) 141/58  Pulse: (!) 56  Resp: 18  SpO2: 98%   Filed Weights   06/10/23 1110  Weight: 191 lb 3.2 oz (86.7 kg)    GENERAL:alert, no distress and comfortable NEURO: alert & oriented x 3 with fluent speech, no focal motor/sensory deficits  LABORATORY DATA:  I have reviewed the data as listed    Component Value Date/Time   NA 140 06/08/2023 1033   K 4.4 06/08/2023 1033   CL 106 06/08/2023 1033   CO2 28 06/08/2023 1033   GLUCOSE 79 06/08/2023 1033   BUN 23 06/08/2023 1033   CREATININE 0.98 06/08/2023 1033   CREATININE 0.93 02/09/2023 1353   CALCIUM 9.3 06/08/2023 1033   PROT 6.8 06/08/2023 1033   ALBUMIN 3.7 06/08/2023 1033   AST 19 06/08/2023 1033   AST 24 02/09/2023 1353   ALT 15 06/08/2023 1033   ALT 16 02/09/2023 1353   ALKPHOS 80 06/08/2023 1033   BILITOT 0.5 06/08/2023 1033   BILITOT 0.4 02/09/2023 1353   GFRNONAA 58 (L) 06/08/2023 1033   GFRNONAA >60 02/09/2023 1353    No results found for: "SPEP", "UPEP"  Lab Results  Component Value Date   WBC 5.0 06/08/2023   NEUTROABS 3.0  06/08/2023   HGB 13.7 06/08/2023   HCT 41.3 06/08/2023   MCV 92.0 06/08/2023   PLT 173 06/08/2023      Chemistry      Component Value Date/Time   NA 140 06/08/2023 1033   K 4.4 06/08/2023 1033   CL 106 06/08/2023 1033   CO2 28 06/08/2023 1033   BUN 23 06/08/2023 1033   CREATININE 0.98 06/08/2023 1033   CREATININE 0.93 02/09/2023 1353      Component Value Date/Time   CALCIUM 9.3 06/08/2023 1033   ALKPHOS 80 06/08/2023 1033   AST 19 06/08/2023 1033   AST 24 02/09/2023 1353   ALT 15 06/08/2023 1033   ALT 16 02/09/2023 1353   BILITOT 0.5 06/08/2023 1033   BILITOT 0.4 02/09/2023 1353       RADIOGRAPHIC STUDIES: I have personally reviewed the radiological images as listed and agreed with the findings in the report. CT CHEST ABDOMEN PELVIS W CONTRAST  Result Date: 06/09/2023 CLINICAL DATA:  Cervical cancer, assess treatment response, breast cancer * Tracking Code: BO * EXAM: CT CHEST, ABDOMEN, AND PELVIS WITH CONTRAST TECHNIQUE: Multidetector CT imaging of the chest, abdomen and pelvis was performed following the standard protocol during bolus administration of intravenous contrast. RADIATION DOSE REDUCTION: This exam was performed according to the departmental dose-optimization program which includes automated exposure control, adjustment of the mA and/or kV according to patient size and/or use of iterative reconstruction technique. CONTRAST:  OMNIPAQUE IOHEXOL 300 MG/ML  SOLN COMPARISON:  CT chest, 10/27/2022, CT chest abdomen pelvis, 03/27/2022 PET-CT, 07/08/2022 FINDINGS: CT CHEST FINDINGS Cardiovascular: Aortic atherosclerosis. Normal heart size. Left coronary artery calcifications. No  pericardial effusion. Mediastinum/Nodes: No enlarged mediastinal, hilar, or axillary lymph nodes. Thyroid gland, trachea, and esophagus demonstrate no significant findings. Lungs/Pleura: Unchanged bandlike scarring and consolidation of the medial left apex containing a biopsy marking clip (series  4, image 116). Unchanged, bland appearing bandlike scarring of the left lung base (series 4, image 95). No pleural effusion or pneumothorax. Musculoskeletal: Status post left lumpectomy. No acute osseous findings. CT ABDOMEN PELVIS FINDINGS Hepatobiliary: No solid liver abnormality is seen. Contracted gallbladder. No gallstones, gallbladder wall thickening, or biliary dilatation. Pancreas: Unremarkable. No pancreatic ductal dilatation or surrounding inflammatory changes. Spleen: Normal in size without significant abnormality. Adrenals/Urinary Tract: Adrenal glands are unremarkable. Kidneys are normal, without renal calculi, solid lesion, or hydronephrosis. Evaluation of the bladder is significantly limited by dense metallic streak artifact from adjacent bilateral hip total arthroplasty. No obvious abnormality. Stomach/Bowel: Stomach is within normal limits. Appendix appears normal. No evidence of bowel wall thickening, distention, or inflammatory changes. Pancolonic diverticulosis severe in the sigmoid. Vascular/Lymphatic: Aortic atherosclerosis. No enlarged abdominal or pelvic lymph nodes. Reproductive: Status post hysterectomy. Evaluation of the low pelvis is significantly limited by dense metallic streak artifact from bilateral hip arthroplasty. Other: No abdominal wall hernia or abnormality. No ascites. Musculoskeletal: No acute osseous findings. Status post bilateral hip total arthroplasty with dense metallic streak artifact. IMPRESSION: 1. Status post hysterectomy. Evaluation of the low pelvis is significantly limited by dense metallic streak artifact from bilateral hip arthroplasty. Within this limitation, no obvious recurrent or metastatic disease in the pelvis. 2. No evidence of lymphadenopathy or metastatic disease in the chest, abdomen, or pelvis. 3. Unchanged bandlike scarring and consolidation of the medial left apex containing a biopsy marking clip, previously biopsied with reported benign infectious  pathology. 4. Pancolonic diverticulosis. 5. Coronary artery disease. Aortic Atherosclerosis (ICD10-I70.0). Electronically Signed   By: Jearld Lesch M.D.   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  There are osteophytes in all 3 compartments and varus malalignment.  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  There is varus malalignment.  XR Lumbar Spine 2-3 Views  Result Date: 05/24/2023 2 views of the lumbar spine on show a degenerative scoliosis and degenerative changes at multiple levels.  There is no acute findings otherwise.

## 2023-06-10 NOTE — Assessment & Plan Note (Signed)
She has chronic fatigue She has very poor sleep, sleep disturbance due to waking up and unrestful sleep She have hypothyroidism but is currently taking Synthroid We discussed importance of staying active and avoiding afternoon naps if possible

## 2023-06-23 ENCOUNTER — Other Ambulatory Visit (INDEPENDENT_AMBULATORY_CARE_PROVIDER_SITE_OTHER): Payer: Medicare Other

## 2023-06-23 ENCOUNTER — Ambulatory Visit: Payer: Medicare Other | Admitting: Orthopedic Surgery

## 2023-06-23 VITALS — BP 161/77 | HR 62 | Ht 62.0 in | Wt 192.0 lb

## 2023-06-23 DIAGNOSIS — M542 Cervicalgia: Secondary | ICD-10-CM

## 2023-06-23 DIAGNOSIS — M545 Low back pain, unspecified: Secondary | ICD-10-CM | POA: Diagnosis not present

## 2023-06-23 DIAGNOSIS — G8929 Other chronic pain: Secondary | ICD-10-CM | POA: Diagnosis not present

## 2023-06-23 MED ORDER — CYCLOBENZAPRINE HCL 10 MG PO TABS: 10.0000 mg | ORAL_TABLET | Freq: Three times a day (TID) | ORAL | 0 refills | Status: DC | PRN

## 2023-06-23 NOTE — Progress Notes (Unsigned)
Orthopedic Spine Surgery Office Note  Assessment: Patient is a 82 y.o. female with 2 issues:  1.  Low back pain that radiates into the bilateral buttocks and improves with flexion of the lumbar spine, suspect neurogenic claudication 2.  Neck pain and spasms with no symptoms of radiculopathy or myelopathy   Plan: -Explained that initially conservative treatment is tried as a significant number of patients may experience relief with these treatment modalities. Discussed that the conservative treatments include:  -activity modification  -physical therapy  -over the counter pain medications  -medrol dosepak  -steroid injections -Patient has tried activity modification -She has no signs or symptoms of myelopathy, so no reason to delay any operative knee surgery at this time -Prescribed flexeril for her neck pain/spasms -Patient should return to office on as-needed basis   Patient expressed understanding of the plan and all questions were answered to the patient's satisfaction.   ___________________________________________________________________________   History:  Patient is a 82 y.o. female who presents today for cervical and lumbar spine.  Patient has a history of a cervical spine fusion done approximately 20 years ago.  She says she has done well since that surgery but has noticed within the last year pain in her neck.  She especially gets spasms in the back of her neck in the paraspinal region.  She does not have any pain radiating to either upper extremity.  There is no trauma or injury that preceded the onset of pain in her neck.  She also wanted talk about her low back pain.  She has had progressively worsening low back pain.  It starts in the back and radiates into the buttock.  She has had this for several years now.  There is no trauma or injury that preceded the onset of pain.  She does not have any pain radiating past the buttock on either side.  Pain is worse with activity and  improves with rest.  She says that it nearly goes away when she sits down.  She leans over a shopping cart at the store to help with the pain.   Weakness: Denies Difficulty with fine motor skills (e.g., buttoning shirts, handwriting): Denies Symptoms of imbalance: Has had issues with balance from vertigo. Had knee injections and felt they helped her balance significantly Paresthesias and numbness: Denies Bowel or bladder incontinence: Has had leakage but no incontinence.  This is been present for years.  No bowel incontinence.  No recent changes in bowel or bladder habits. Saddle anesthesia: Denies  Treatments tried: Activity modification  Review of systems: Denies fevers and chills, night sweats, unexplained weight loss, pain that wakes them at night.  Has history of uterine and breast cancer  Past medical history: History of uterine and breast cancer Hyperlipidemia Hypertension Migraines GERD Vertigo Basal cell carcinoma Hypothyroidism  Allergies: Penicillin, sulfa, vancomycin, codeine  Past surgical history:  C4-6 ACDF Breast lumpectomy Knee arthroscopy Hysterectomy Thyroidectomy Bilateral THA  Social history: Denies use of nicotine product (smoking, vaping, patches, smokeless) Alcohol use: Denies Denies recreational drug use   Physical Exam:  BMI of 35.1  General: no acute distress, appears stated age Neurologic: alert, answering questions appropriately, following commands Respiratory: unlabored breathing on room air, symmetric chest rise Psychiatric: appropriate affect, normal cadence to speech   MSK (spine):  -Strength exam      Left  Right Grip strength                5/5  5/5 Interosseus   5/5  5/5 Wrist extension  5/5  5/5 Wrist flexion   5/5  5/5 Elbow flexion   5/5  5/5 Deltoid    5/5  5/5  EHL    5/5  5/5 TA    5/5  5/5 GSC    5/5  5/5 Knee extension  5/5  5/5 Hip flexion   5/5  5/5  -Sensory exam    Sensation intact to light touch  in L3-S1 nerve distributions of bilateral lower extremities  Sensation intact to light touch in C5-T1 nerve distributions of bilateral upper extremities  -Brachioradialis DTR: 2/4 on the left, 2/4 on the right -Biceps DTR: 2/4 on the left, 2/4 on the right -Achilles DTR: 2/4 on the left, 2/4 on the right -Patellar tendon DTR: 2/4 on the left, 2/4 on the right  -Spurling: Negative bilaterally -Hoffman sign: Negative bilaterally -Clonus: No beats bilaterally -Interosseous wasting: None seen -Grip and release test: Negative -Romberg: Negative -Gait: Normal -Negative straight leg raise bilaterally  Imaging: XR of the cervical spine from 06/23/2023 was independently reviewed and interpreted, showing prior anterior cervical discectomy and fusion from C4-C6.  Disc height loss with anterior osteophyte formation at C3/4 and C6/7.  No evidence of instability on flexion/extension views.  No fracture or dislocation seen.  XR of the lumbar spine from 06/19/2023 was independently reviewed and interpreted, showing bilateral THA.  Lumbar scoliosis with apex to the right.  Disc height loss at L5/S1.  Facet arthropathy in the lower lumbar spine.  No evidence of instability on flexion/extension views.  No fracture or dislocation seen.   Patient name: Kristin Carlson Patient MRN: [id number] Date of visit: 06/23/23

## 2023-06-28 ENCOUNTER — Encounter: Payer: Self-pay | Admitting: Physician Assistant

## 2023-06-28 ENCOUNTER — Ambulatory Visit (INDEPENDENT_AMBULATORY_CARE_PROVIDER_SITE_OTHER): Payer: Medicare Other | Admitting: Physician Assistant

## 2023-06-28 VITALS — BP 146/74 | HR 79 | Ht 63.0 in | Wt 193.0 lb

## 2023-06-28 DIAGNOSIS — R140 Abdominal distension (gaseous): Secondary | ICD-10-CM | POA: Diagnosis not present

## 2023-06-28 DIAGNOSIS — E739 Lactose intolerance, unspecified: Secondary | ICD-10-CM

## 2023-06-28 DIAGNOSIS — R194 Change in bowel habit: Secondary | ICD-10-CM | POA: Diagnosis not present

## 2023-06-28 DIAGNOSIS — R197 Diarrhea, unspecified: Secondary | ICD-10-CM

## 2023-06-28 MED ORDER — LOPERAMIDE HCL 2 MG PO TABS: ORAL_TABLET | ORAL | 3 refills | Status: AC

## 2023-06-28 NOTE — Progress Notes (Signed)
Chief Complaint:   Irregular bowel movements, bloating  HPI:    Kristin Carlson is an 82 year old female with a past medical history as listed below including reflux, uterine cancer and breast cancer, known to Dr. Leone Payor, who was referred to me by Olena Leatherwood, FNP for a complaint of irregular bowel movements and bloating.      06/24/2017 colonoscopy with severe diverticulosis, grade 2 internal hemorrhoids and a 3 mm polyp in the cecum.  Repeat recommended in 5 years.    05/18/2023 patient referred urgently to [place] for changes in bowel habits having diarrhea and thin ribbon stools with nausea and bloating.  Also has a history of uterine cancer.  Apparently [facility].  Apparently contacted that time was doing some better with Imodium.    06/08/2023 CBC and CMP normal    06/09/2023 CT of the chest abdomen pelvis with contrast for cervical cancer/assess treatment response/breast cancer showing status post hysterectomy which showed pancolonic diverticulosis and CAD.    06/10/2023 patient seen by her oncologist for history of endometrial cancer that time discussed that CT imaging showed no evidence of disease.  Repeat recommended in 6 months.    Today, the patient presents to clinic and describes that over the past few months she has learned that she is lactose intolerant and if she avoids dairy products that greatly helps with all of her bloating gas and cramps.  She will occasionally use Lactaid which helps but does not completely relieve the symptoms if she really wants to eat something.  She has continued with diarrhea but has learned that if she takes a half a tab of Imodium every other day she will have solid stools and no more diarrhea.  In fact all of her symptoms are about 98% better since she initially made the appointment.  She has no acute complaints or concerns.  No weight loss or blood in her stool.  No further abdominal pain.    Denies fever, chills, weight loss, nausea,  vomiting, heartburn, reflux or symptoms that awaken her from sleep.  Past Medical History:  Diagnosis Date   Actinic keratosis 06/04/2021   right anteromedial thigh   Anemia    Arthritis    Basal cell carcinoma 06/04/2021   right suprapubic, EDC 07/29/2021   Basal cell carcinoma 07/29/2021   left nasal ala. atypical basaloid cells   Basal cell carcinoma 03/04/2023   Right lateral cheek. Nodular. Referral to radiation oncology.   BCC (basal cell carcinoma of skin) 12/03/2022   superficial right superior shoulder  scheduled 03/04/23  ED&C   Family history of lung cancer    GERD (gastroesophageal reflux disease)    History of basal cell carcinoma 07/29/2021   left thigh, EDC at time of bx   History of radiation therapy    vaginal brachytherapy [facility] 12/18/2021-01/13/2022 Dr Antony Blackbird   HLD (hyperlipidemia)    Hypertension    Hypothyroidism    Pneumonia    HX OF YEARS AGO   PONV (postoperative nausea and vomiting)     Past Surgical History:  Procedure Laterality Date   BREAST BIOPSY Left 11/27/2021   u/s bs, venus clip,  INVASIVE MAMMARY CARCINOMA   BREAST CYST ASPIRATION     BREAST LUMPECTOMY Left 06/16/2022   BREAST LUMPECTOMY WITH RADIOACTIVE SEED LOCALIZATION Left 06/16/2022   Procedure: LEFT BREAST LUMPECTOMY WITH RADIOACTIVE SEED LOCALIZATION;  Surgeon: Emelia Loron, MD;  Location: [facility];  Service: General;  Laterality: Left;   BRONCHIAL BIOPSY  07/10/2022   Procedure: BRONCHIAL BIOPSIES;  Surgeon: Omar Person, MD;  Location: [facility] ENDOSCOPY;  Service: Pulmonary;;   BRONCHIAL BRUSHINGS  07/10/2022   Procedure: BRONCHIAL BRUSHINGS;  Surgeon: Omar Person, MD;  Location: [facility] ENDOSCOPY;  Service: Pulmonary;;   BRONCHIAL NEEDLE ASPIRATION BIOPSY  07/10/2022   Procedure: BRONCHIAL NEEDLE ASPIRATION BIOPSIES;  Surgeon: Omar Person, MD;  Location: [facility] ENDOSCOPY;  Service: Pulmonary;;   BRONCHIAL WASHINGS  07/10/2022   Procedure: BRONCHIAL  WASHINGS;  Surgeon: Omar Person, MD;  Location: [facility] ENDOSCOPY;  Service: Pulmonary;;   cataract Bilateral    FIDUCIAL MARKER PLACEMENT  07/10/2022   Procedure: FIDUCIAL MARKER PLACEMENT;  Surgeon: Omar Person, MD;  Location: [facility] ENDOSCOPY;  Service: Pulmonary;;   HIP SURGERY     IR IMAGING GUIDED PORT INSERTION  11/07/2021   IR REMOVAL TUN ACCESS W/ PORT W/O FL MOD SED  01/06/2022   KNEE ARTHROSCOPY Left 2003   NECK SURGERY  1997   Fusion C3, C4, C5   ROBOTIC ASSISTED TOTAL HYSTERECTOMY WITH BILATERAL SALPINGO OOPHERECTOMY Bilateral 10/14/2021   Procedure: XI ROBOTIC ASSISTED TOTAL HYSTERECTOMY WITH BILATERAL SALPINGO OOPHORECTOMY, LYMPH NODE DISSECTION,  LAPAROTOMY;  Surgeon: Carver Fila, MD;  Location: [facility];  Service: Gynecology;  Laterality: Bilateral;   SENTINEL NODE BIOPSY N/A 10/14/2021   Procedure: SENTINEL NODE DISSECTION;  Surgeon: Carver Fila, MD;  Location: [facility];  Service: Gynecology;  Laterality: N/A;   THYROIDECTOMY  2007   TOTAL HIP ARTHROPLASTY Bilateral    Left Hip -2015, Right Hip 208   VIDEO BRONCHOSCOPY WITH RADIAL ENDOBRONCHIAL ULTRASOUND  07/10/2022   Procedure: RADIAL ENDOBRONCHIAL ULTRASOUND;  Surgeon: Omar Person, MD;  Location: [facility] ENDOSCOPY;  Service: Pulmonary;;    Current Outpatient Medications  Medication Sig Dispense Refill   acetaminophen (TYLENOL) 500 MG tablet Take 1,000 mg by mouth every 6 (six) hours as needed (pain.).     celecoxib (CELEBREX) 200 MG capsule Take 1 capsule (200 mg total) by mouth 2 (two) times daily between meals as needed. 60 capsule 3   Cholecalciferol (VITAMIN D3) 50 MCG (2000 UT) TABS Take 2,000 Units by mouth in the morning.     cyclobenzaprine (FLEXERIL) 10 MG tablet Take 1 tablet (10 mg total) by mouth 3 (three) times daily as needed for muscle spasms. 50 tablet 0   levothyroxine (SYNTHROID) 125 MCG tablet Take 125 mcg by mouth daily before breakfast.     magnesium oxide (MAG-OX) 400 MG tablet  Take 400 mg by mouth at bedtime.     Multiple Vitamin (MULTIVITAMIN WITH MINERALS) TABS tablet Take 1 tablet by mouth in the morning.     Omega-3 1000 MG CAPS Take 1,000-2,000 mg by mouth See admin instructions. Take 2 capsules by mouth in the morning & take 1 capsule by mouth at night.     pantoprazole (PROTONIX) 20 MG tablet Take 20 mg by mouth daily as needed for heartburn or indigestion.     propranolol (INDERAL) 40 MG tablet Take 40 mg by mouth 3 (three) times daily.     rosuvastatin (CRESTOR) 5 MG tablet Take 5 mg by mouth at bedtime.     Simethicone 125 MG TABS Take 250 mg by mouth 2 (two) times daily as needed (gas).     temazepam (RESTORIL) 15 MG capsule Take 15 mg by mouth at bedtime as needed for sleep.     No current facility-administered medications for this visit.  Allergies as of 06/28/2023 - Review Complete 06/10/2023  Allergen Reaction Noted   Lactose Other (See Comments) 07/08/2021   Codeine Nausea And Vomiting 11/16/2017   Penicillins Rash 11/16/2017   Sulfa antibiotics Rash 08/26/2021   Vancomycin Itching and Rash 11/21/2021    Family History  Problem Relation Age of Onset   Cancer Father        Lung Cancer   Lung cancer Father        smoked   Skin cancer Maternal Grandmother    Skin cancer Paternal Grandmother    Cancer Paternal Aunt        mouth   Colon cancer Neg Hx    Breast cancer Neg Hx    Ovarian cancer Neg Hx    Endometrial cancer Neg Hx    Pancreatic cancer Neg Hx    Prostate cancer Neg Hx     Social History   Socioeconomic History   Marital status: Married    Spouse name: Molly Maduro   Number of children: 2   Years of education: Not on file   Highest education level: Not on file  Occupational History   Not on file  Tobacco Use   Smoking status: Former    Current packs/day: 0.00    Average packs/day: 0.5 packs/day for 3.0 years (1.5 ttl pk-yrs)    Types: Cigarettes    Start date: 12/01/1963    Quit date: 11/30/1966    Years since quitting:  56.6   Smokeless tobacco: Never  Vaping Use   Vaping status: Never Used  Substance and Sexual Activity   Alcohol use: Yes    Comment: RARE   Drug use: Never   Sexual activity: Not Currently  Other Topics Concern   Not on file  Social History Narrative   Retired Charity fundraiser   Social Determinants of Health   Financial Resource Strain: Low Risk  (01/06/2023)   Received from [facility], [facility]   Overall Financial Resource Strain (CARDIA)    Difficulty of Paying Living Expenses: Not hard at all  Food Insecurity: No Food Insecurity (01/06/2023)   Received from [facility], [facility]   Hunger Vital Sign    Worried About Running Out of Food in the Last Year: Never true    Ran Out of Food in the Last Year: Never true  Transportation Needs: No Transportation Needs (01/06/2023)   Received from [facility], [facility]   [facility] - Transportation    Lack of Transportation (Medical): No    Lack of Transportation (Non-Medical): No  Physical Activity: Insufficiently Active (10/08/2021)   Received from [facility], [facility]   Exercise Vital Sign    Days of Exercise per Week: 1 day    Minutes of Exercise per Session: 30 min  Stress: Stress Concern Present (10/08/2021)   Received from [facility], [facility] - Occupational Stress Questionnaire    Feeling of Stress : Rather much  Social Connections: Socially Integrated (10/08/2021)   Received from [facility], [facility]   Social Connection and Isolation Panel [NHANES]    Frequency of Communication with Friends and Family: More than three times a week    Frequency of Social Gatherings with Friends and Family: More than three times a week    Attends Religious Services: More than 4 times per year    Active Member of Clubs or Organizations: Yes  Attends Banker Meetings: More than 4 times per year    Marital Status: Married  Catering manager  Violence: Not At Risk (10/08/2021)   Received from [facility], [facility]   Humiliation, Afraid, Rape, and Kick questionnaire    Fear of Current or Ex-Partner: No    Emotionally Abused: No    Physically Abused: No    Sexually Abused: No    Review of Systems:    Constitutional: No weight loss, fever or chills Skin: No rash  Cardiovascular: No chest pain Respiratory: No SOB Gastrointestinal: See HPI and otherwise negative Genitourinary: No dysuria Neurological: No headache, dizziness or syncope Musculoskeletal: No new muscle or joint pain Hematologic: No bleeding  Psychiatric: No history of depression or anxiety   Physical Exam:  Vital signs: BP (!) 146/74   Pulse 79   Ht 5\' 3"  (1.6 m)   Wt 193 lb (87.5 kg)   BMI 34.19 kg/m    Constitutional:   Pleasant elderly Caucasian female appears to be in NAD, Well developed, Well nourished, alert and cooperative Head:  Normocephalic and atraumatic. Eyes:   PEERL, EOMI. No icterus. Conjunctiva pink. Ears:  Normal auditory acuity. Neck:  Supple Throat: Oral cavity and pharynx without inflammation, swelling or lesion.  Respiratory: Respirations even and unlabored. Lungs clear to auscultation bilaterally.   No wheezes, crackles, or rhonchi.  Cardiovascular: Normal S1, S2. No MRG. Regular rate and rhythm. No peripheral edema, cyanosis or pallor.  Gastrointestinal:  Soft, nondistended, nontender. No rebound or guarding. Normal bowel sounds. No appreciable masses or hepatomegaly. Rectal:  Not performed.  Msk:  Symmetrical without gross deformities. Without edema, no deformity or joint abnormality.  Neurologic:  Alert and  oriented x4;  grossly normal neurologically.  Skin:   Dry and intact without significant lesions or rashes. Psychiatric: Demonstrates good judgement and reason without abnormal affect or behaviors.  RELEVANT LABS AND IMAGING: CBC    Component Value Date/Time   WBC 5.0 06/08/2023 1033   RBC 4.49 06/08/2023  1033   HGB 13.7 06/08/2023 1033   HGB 13.3 02/09/2023 1353   HCT 41.3 06/08/2023 1033   PLT 173 06/08/2023 1033   PLT 189 02/09/2023 1353   MCV 92.0 06/08/2023 1033   MCH 30.5 06/08/2023 1033   MCHC 33.2 06/08/2023 1033   RDW 13.3 06/08/2023 1033   LYMPHSABS 1.3 06/08/2023 1033   MONOABS 0.5 06/08/2023 1033   EOSABS 0.1 06/08/2023 1033   BASOSABS 0.0 06/08/2023 1033    CMP     Component Value Date/Time   NA 140 06/08/2023 1033   K 4.4 06/08/2023 1033   CL 106 06/08/2023 1033   CO2 28 06/08/2023 1033   GLUCOSE 79 06/08/2023 1033   BUN 23 06/08/2023 1033   CREATININE 0.98 06/08/2023 1033   CREATININE 0.93 02/09/2023 1353   CALCIUM 9.3 06/08/2023 1033   PROT 6.8 06/08/2023 1033   ALBUMIN 3.7 06/08/2023 1033   AST 19 06/08/2023 1033   AST 24 02/09/2023 1353   ALT 15 06/08/2023 1033   ALT 16 02/09/2023 1353   ALKPHOS 80 06/08/2023 1033   BILITOT 0.5 06/08/2023 1033   BILITOT 0.4 02/09/2023 1353   GFRNONAA 58 (L) 06/08/2023 1033   GFRNONAA >60 02/09/2023 1353    Assessment: 1.  Change in bowel habits: Towards looser stools, does take Magnesium chronically, sometimes uses magnesium citrate for cramps, better with a half an Imodium every other day which makes her have solid stools; consider relation to  diet as below +/- magnesium 2.  Gas and bloating: Better now that she is dairy free and using Lactaid; most likely diet related  Plan: 1.  Currently symptoms are under control with Imodium and avoiding dairy products.  I do not think we need to do further workup.  If something changes the patient will call and let us know. 2.  Discussed red flag symptoms including weight loss, blood in her stool or abdominal pain.  She will call if she experiences any of these or if the Imodium does not work anymore. 3.  Prescribed Loperamide to half a tab every other day #45 with 3 refills sent to [facility]. 4.  Patient to follow in clinic with [place] as needed.  Hyacinth Meeker,  PA-C [facility] 06/28/2023, 8:56 AM  Cc: Olena Leatherwood, FNP

## 2023-06-28 NOTE — Patient Instructions (Signed)
We have sent the following medications to your pharmacy for you to pick up at your convenience:  Loperamide  Please follow up as needed.  _______________________________________________________  If your blood pressure at your visit was 140/90 or greater, please contact your primary care physician to follow up on this.  _______________________________________________________  If you are age 80 or older, your body mass index should be between 23-30. Your Body mass index is 34.19 kg/m. If this is out of the aforementioned range listed, please consider follow up with your Primary Care Provider.  If you are age 68 or younger, your body mass index should be between 19-25. Your Body mass index is 34.19 kg/m. If this is out of the aformentioned range listed, please consider follow up with your Primary Care Provider.   ________________________________________________________  The [facility] providers would like to encourage you to use [facility] to communicate with providers for non-urgent requests or questions.  Due to long hold times on the telephone, sending your provider a message by [facility] may be a faster and more efficient way to get a response.  Please allow 48 business hours for a response.  Please remember that this is for non-urgent requests.  _______________________________________________________

## 2023-07-12 ENCOUNTER — Encounter: Payer: Self-pay | Admitting: Orthopaedic Surgery

## 2023-07-12 ENCOUNTER — Ambulatory Visit (INDEPENDENT_AMBULATORY_CARE_PROVIDER_SITE_OTHER): Payer: Medicare Other | Admitting: Orthopaedic Surgery

## 2023-07-12 DIAGNOSIS — M25562 Pain in left knee: Secondary | ICD-10-CM

## 2023-07-12 DIAGNOSIS — G8929 Other chronic pain: Secondary | ICD-10-CM

## 2023-07-12 DIAGNOSIS — M1712 Unilateral primary osteoarthritis, left knee: Secondary | ICD-10-CM | POA: Diagnosis not present

## 2023-07-12 DIAGNOSIS — M25561 Pain in right knee: Secondary | ICD-10-CM

## 2023-07-12 DIAGNOSIS — M1711 Unilateral primary osteoarthritis, right knee: Secondary | ICD-10-CM | POA: Diagnosis not present

## 2023-07-12 NOTE — Progress Notes (Signed)
The patient comes in today for follow-up as a relates to her knees.  She has significant arthritic findings in both knees with the left knee much worse than the right.  6 weeks ago I did place sterile injection of both knees.  That has helped her quite a bit.  She is 82 years old and tries to be active but she says that is really what is hard her the most is her inactivity.  I did give her exercises to try for her knees but she has not tried those.  She says that if she could lose weight that would help her as well.  Both knees have slight varus malalignment but good range of motion.  Both knees show tricompartment arthritis with the left worse than right.  We talked about the potential for hyaluronic acid but we both agree that that is likely not indicated given the severity of her arthritis.  She is not interested in any type of surgery for her knees including knee replacement surgery.  I would like to see her back in 4 weeks because that will be a week before she leaves for the beach with her husband for a trip.  I would be fine with placing steroid injections in both her knees at that visit.  She is not a diabetic.  She agrees with our treatment plan.

## 2023-07-21 ENCOUNTER — Encounter: Payer: Self-pay | Admitting: Obstetrics and Gynecology

## 2023-07-28 ENCOUNTER — Telehealth: Payer: Self-pay | Admitting: Licensed Clinical Social Worker

## 2023-07-28 ENCOUNTER — Encounter: Payer: Self-pay | Admitting: Licensed Clinical Social Worker

## 2023-07-28 NOTE — Progress Notes (Signed)
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 These results were disclosed via phone.    MUTYH HETEROZYGOUS RECOMMENDATIONS: We discussed the implications of a heterozygous MUTYH mutation for Ms. Mcnulty and discussed who else in the family should have genetic testing. We recommended Ms. Mittelstaedt follow the most updated medical management guidelines (NCCN Guidelines v1.2024) for heterozygous MUTYH mutations:   No known increased risk for colon cancer General population screening is appropriate for MUTYH heterozygotes Manage based on personal history and first-degree family history       FAMILY MEMBERS: Since we now know the mutation in Ms. Gillison, we can test relatives to determine their MUTYH status. We will be happy to meet with any of the family members or refer them to a genetic counselor in their local area. To locate genetic counselors in other cities, individuals can visit the website of the [employer] ([email or web address]) and Financial controller for a Veterinary surgeon by zip code.     Lacy Duverney, MS, [facility] Genetic Counselor [email or web address] Phone: [phone]

## 2023-07-28 NOTE — Telephone Encounter (Signed)
Called Ms. Weinert to review her amended genetic test result that came out today. 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 Discussed this result with her and that it will not change her management, but that her children can consider testing to see if they are also carriers or possibly even have MAP.  Amended result has been uploaded to molecular pathology.    Lacy Duverney, MS, [facility] Genetic Counselor [email or web address] Phone: [phone]

## 2023-07-29 ENCOUNTER — Ambulatory Visit: Payer: Self-pay | Admitting: Radiation Oncology

## 2023-08-09 ENCOUNTER — Encounter: Payer: Self-pay | Admitting: Orthopaedic Surgery

## 2023-08-09 ENCOUNTER — Ambulatory Visit (INDEPENDENT_AMBULATORY_CARE_PROVIDER_SITE_OTHER): Payer: Medicare Other | Admitting: Orthopaedic Surgery

## 2023-08-09 DIAGNOSIS — M25561 Pain in right knee: Secondary | ICD-10-CM | POA: Diagnosis not present

## 2023-08-09 DIAGNOSIS — M25562 Pain in left knee: Secondary | ICD-10-CM | POA: Diagnosis not present

## 2023-08-09 DIAGNOSIS — M1712 Unilateral primary osteoarthritis, left knee: Secondary | ICD-10-CM | POA: Diagnosis not present

## 2023-08-09 DIAGNOSIS — G8929 Other chronic pain: Secondary | ICD-10-CM

## 2023-08-09 DIAGNOSIS — M1711 Unilateral primary osteoarthritis, right knee: Secondary | ICD-10-CM | POA: Diagnosis not present

## 2023-08-09 MED ORDER — LIDOCAINE HCL 1 % IJ SOLN
3.0000 mL | INTRAMUSCULAR | Status: AC | PRN
Start: 2023-08-09 — End: 2023-08-09
  Administered 2023-08-09: 3 mL

## 2023-08-09 MED ORDER — METHYLPREDNISOLONE ACETATE 40 MG/ML IJ SUSP
40.0000 mg | INTRAMUSCULAR | Status: AC | PRN
Start: 2023-08-09 — End: 2023-08-09
  Administered 2023-08-09: 40 mg via INTRA_ARTICULAR

## 2023-08-09 NOTE — Progress Notes (Signed)
The patient is well-known to [place].  She is 82 years old and very active.  Her and her husband are leaving for the beach soon.  She comes in today hoping to have steroid injections in both her knees.  She says they are feeling better with these injections.  She has known osteoarthritis in both knees.  She is not a diabetic.  Both knees show slight varus malalignment.  There is slight swelling with either knee and she has painful range of motion but overall looks good.  Per her request I did place a steroid injection in each knee which she tolerated well.  She knows to wait at least 3 to 4 months between injections.  Another consideration at some point would be knee replacement surgery if conservative treatment fails.  All questions and concerns were answered and addressed.    Procedure Note  Patient: Kristin Carlson             Date of Birth: [birth year]           MRN: [id number]             Visit Date: 08/09/2023  Procedures: Visit Diagnoses:  1. Chronic pain of left knee   2. Chronic pain of right knee   3. Unilateral primary osteoarthritis, left knee   4. Unilateral primary osteoarthritis, right knee     Large Joint Inj: R knee on 08/09/2023 1:47 PM Indications: diagnostic evaluation and pain Details: 22 G 1.5 in needle, superolateral approach  Arthrogram: No  Medications: 3 mL lidocaine 1 %; 40 mg methylPREDNISolone acetate 40 MG/ML Outcome: tolerated well, no immediate complications Procedure, treatment alternatives, risks and benefits explained, specific risks discussed. Consent was given by the patient. Immediately prior to procedure a time out was called to verify the correct patient, procedure, equipment, support staff and site/side marked as required. Patient was prepped and draped in the usual sterile fashion.    Large Joint Inj: L knee on 08/09/2023 1:48 PM Indications: diagnostic evaluation and pain Details: 22 G 1.5 in needle, superolateral approach  Arthrogram:  No  Medications: 3 mL lidocaine 1 %; 40 mg methylPREDNISolone acetate 40 MG/ML Outcome: tolerated well, no immediate complications Procedure, treatment alternatives, risks and benefits explained, specific risks discussed. Consent was given by the patient. Immediately prior to procedure a time out was called to verify the correct patient, procedure, equipment, support staff and site/side marked as required. Patient was prepped and draped in the usual sterile fashion.

## 2023-08-25 NOTE — Progress Notes (Signed)
Radiation Oncology         [phone] ________________________________  Name: Kristin Carlson MRN: [id number]  Date: 08/26/2023  DOB: 30-Sep-1941  Follow-Up Visit Note  CC: Olena Leatherwood, FNP  Carver Fila, MD  No diagnosis found.  Diagnosis: The encounter diagnosis was Endometrial cancer (HCC).   Stage III (pT3, pN0, cM0) endometrial cancer, high-grade serous   New diagnosis of Left Breast UOQ, Invasive Mammary Carcinoma with DCIS, ER+ / PR+ / Her2-, Grade 2 : s/p lumpectomy   Interval Since Last Radiation: 1 year, 7 months, and 12 days   Intent: Curative  Radiation Treatment Dates: 12/18/2021 through 01/13/2022 Site Technique Total Dose (Gy) Dose per Fx (Gy) Completed Fx Beam Energies  Vagina: Pelvis HDR-brachy 30/30 6 5/5 Ir-192   Narrative:  The patient returns today for routine follow-up. She was last seen here for follow-up on 01/28/23 and was NED on examination at that time.   Since her last visit, she has continued to follow with Dr. Bertis Ruddy for surveillance of her disease. To review, she was previously on Arimidex which was discontinued secondary to significant side effects. She has expressed to both Dr. Bertis Ruddy and myself that she has not interested in trying an alternate form of treatment.                  She most recently followed-up with Dr. Winferd Humphrey office on 05/14/23. During which time, she denied any symptoms concerning for disease recurrence and she was noted as NED on examination. She was noted to endorse ongoing issues related to her diverticulosis (now followed by GI) including frequent diarrhea and nausea in the morning, moderate stress and urge bladder incontinence, and joint issues (slightly improved since discontinuing Arimidex).     Pertinent imaging performed in the interval since her last visit includes:  -- CT CAP with contrast on 06/08/23 which demonstrated no visible evidence of recurrent or metastatic disease in the pelvis, and no evidence of  lymphadenopathy or metastatic disease in the chest, abdomen, or pelvis. (Of note: evaluation significantly limited by dense metallic streak artifact from bilateral hip arthroplasty).  -- Bilateral diagnostic mammogram on 02/25/23 showed no evidence of malignancy in either breast.   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 She was seen in consultation by Dr. Rushie Chestnut at [facility] on 03/16/23 for consideration of radiation therapy to these areas, Although she was initially interested in radiation to these areas rather than undergoing Mohs, she has ultimately opted to pursue Mohs in the future.   Her husband is unfortunately battling end stage crohns disease and liver disease and she is his primary care giver. Her priority is caring for her husband and is trying to put off her Mohs procedure or as long as possible.   ***  Allergies:  is allergic to lactose, codeine, penicillins, sulfa antibiotics, and vancomycin.  Meds: Current Outpatient Medications  Medication Sig Dispense Refill   acetaminophen (TYLENOL) 500 MG tablet Take 1,000 mg by mouth every 6 (six) hours as needed (pain.).     Cholecalciferol (VITAMIN D3) 50 MCG (2000 UT) TABS Take 2,000 Units by mouth in the morning.     levothyroxine (SYNTHROID) 125 MCG tablet Take 125 mcg by mouth daily before breakfast.     loperamide (IMODIUM A-D) 2 MG tablet 1/2 tablet by mouth every other day 45 tablet 3   magnesium oxide (MAG-OX) 400 MG tablet Take 400 mg by  mouth at bedtime.     Multiple Vitamin (MULTIVITAMIN WITH MINERALS) TABS tablet Take 1 tablet by mouth in the morning.     Omega-3 1000 MG CAPS Take 1,000-2,000 mg by mouth See admin instructions. Take 2 capsules by mouth in the morning & take 1 capsule by mouth at night.     pantoprazole (PROTONIX) 20 MG tablet Take 20 mg by mouth daily as needed for heartburn or indigestion.     propranolol (INDERAL) 40 MG tablet Take 40  mg by mouth 3 (three) times daily.     rosuvastatin (CRESTOR) 5 MG tablet Take 5 mg by mouth at bedtime.     Simethicone 125 MG TABS Take 250 mg by mouth 2 (two) times daily as needed (gas).     temazepam (RESTORIL) 15 MG capsule Take 15 mg by mouth at bedtime as needed for sleep.     No current facility-administered medications for this encounter.    Physical Findings: The patient is in no acute distress. Patient is alert and oriented.  vitals were not taken for this visit. .  No significant changes. Lungs are clear to auscultation bilaterally. Heart has regular rate and rhythm. No palpable cervical, supraclavicular, or axillary adenopathy. Abdomen soft, non-tender, normal bowel sounds.  On pelvic examination the external genitalia were unremarkable. A speculum exam was performed. There are no mucosal lesions noted in the vaginal vault. A Pap smear was obtained of the proximal vagina. On bimanual and rectovaginal examination there were no pelvic masses appreciated. ***   Lab Findings: Lab Results  Component Value Date   WBC 5.0 06/08/2023   HGB 13.7 06/08/2023   HCT 41.3 06/08/2023   MCV 92.0 06/08/2023   PLT 173 06/08/2023    Radiographic Findings: No results found.  Impression: Stage III (pT3, pN0, cM0) endometrial cancer, high-grade serous   New diagnosis of Left Breast UOQ, Invasive Mammary Carcinoma with DCIS, ER+ / PR+ / Her2-, Grade 2 : s/p lumpectomy   The patient is recovering from the effects of radiation.  ***  Plan:  ***   *** minutes of total time was spent for this patient encounter, including preparation, face-to-face counseling with the patient and coordination of care, physical exam, and documentation of the encounter. ____________________________________  Billie Lade, PhD, MD  This document serves as a record of services personally performed by Antony Blackbird, MD. It was created on his behalf by Neena Rhymes, a trained medical scribe. The creation of  this record is based on the scribe's personal observations and the provider's statements to them. This document has been checked and approved by the attending provider.

## 2023-08-26 ENCOUNTER — Ambulatory Visit
Admission: RE | Admit: 2023-08-26 | Discharge: 2023-08-26 | Disposition: A | Discharge status: Home / Self Care | Payer: Medicare Other | Source: Ambulatory Visit | Attending: Radiation Oncology | Admitting: Radiation Oncology

## 2023-08-26 ENCOUNTER — Encounter: Payer: Self-pay | Admitting: Radiation Oncology

## 2023-08-26 ENCOUNTER — Ambulatory Visit: Payer: TRICARE For Life (TFL) | Admitting: Radiation Oncology

## 2023-08-26 VITALS — Temp 97.1°F | Resp 18 | Ht 63.0 in | Wt 187.0 lb

## 2023-08-26 DIAGNOSIS — C44311 Basal cell carcinoma of skin of nose: Secondary | ICD-10-CM | POA: Diagnosis present

## 2023-08-26 DIAGNOSIS — C541 Malignant neoplasm of endometrium: Secondary | ICD-10-CM

## 2023-08-26 DIAGNOSIS — Z8542 Personal history of malignant neoplasm of other parts of uterus: Secondary | ICD-10-CM | POA: Insufficient documentation

## 2023-08-26 DIAGNOSIS — Z79890 Hormone replacement therapy (postmenopausal): Secondary | ICD-10-CM | POA: Diagnosis not present

## 2023-08-26 DIAGNOSIS — Z791 Long term (current) use of non-steroidal anti-inflammatories (NSAID): Secondary | ICD-10-CM | POA: Diagnosis not present

## 2023-08-26 DIAGNOSIS — Z79899 Other long term (current) drug therapy: Secondary | ICD-10-CM | POA: Diagnosis not present

## 2023-08-26 DIAGNOSIS — Z853 Personal history of malignant neoplasm of breast: Secondary | ICD-10-CM | POA: Insufficient documentation

## 2023-08-26 DIAGNOSIS — Z923 Personal history of irradiation: Secondary | ICD-10-CM | POA: Insufficient documentation

## 2023-08-26 NOTE — Progress Notes (Addendum)
Daurice Shaddix is here today for follow up post radiation to the pelvic.  They completed their radiation on: 01/13/2022  Does the patient complain of any of the following:  Pain:No Abdominal bloating: Yes Diarrhea/Constipation: Diarrhea due to diarrhea but taking Imodium  Nausea/Vomiting: No Vaginal Discharge: No Blood in Urine or Stool: No Urinary Issues (dysuria/incomplete emptying/ incontinence/ increased frequency/urgency): Incontinence and incomplete emptying Does patient report using vaginal dilator 2-3 times a week and/or sexually active 2-3 weeks: She states that she is not using her dilators but encouraged to use them. Post radiation skin changes: No   Additional comments if applicable: No concerns at this time. She is still having post fatigue  Temp (!) 97.1 F (36.2 C) (Temporal)   Resp 18   Ht 5\' 3"  (1.6 m)   Wt 187 lb (84.8 kg)   SpO2 100%   BMI 33.13 kg/m  147/65

## 2023-11-04 ENCOUNTER — Encounter: Payer: Self-pay | Admitting: Gynecologic Oncology

## 2023-11-11 ENCOUNTER — Inpatient Hospital Stay: Payer: Medicare Other | Attending: Gynecologic Oncology | Admitting: Gynecologic Oncology

## 2023-11-11 ENCOUNTER — Ambulatory Visit: Payer: TRICARE For Life (TFL) | Admitting: Dermatology

## 2023-11-11 ENCOUNTER — Encounter: Payer: Self-pay | Admitting: Gynecologic Oncology

## 2023-11-11 VITALS — BP 148/72 | HR 62 | Temp 98.6°F | Resp 20 | Wt 193.4 lb

## 2023-11-11 DIAGNOSIS — Z8542 Personal history of malignant neoplasm of other parts of uterus: Secondary | ICD-10-CM | POA: Diagnosis not present

## 2023-11-11 DIAGNOSIS — Z923 Personal history of irradiation: Secondary | ICD-10-CM | POA: Diagnosis not present

## 2023-11-11 DIAGNOSIS — Z8541 Personal history of malignant neoplasm of cervix uteri: Secondary | ICD-10-CM | POA: Diagnosis present

## 2023-11-11 DIAGNOSIS — Z90722 Acquired absence of ovaries, bilateral: Secondary | ICD-10-CM | POA: Diagnosis not present

## 2023-11-11 DIAGNOSIS — C541 Malignant neoplasm of endometrium: Secondary | ICD-10-CM

## 2023-11-11 DIAGNOSIS — Z08 Encounter for follow-up examination after completed treatment for malignant neoplasm: Secondary | ICD-10-CM | POA: Insufficient documentation

## 2023-11-11 DIAGNOSIS — Z9221 Personal history of antineoplastic chemotherapy: Secondary | ICD-10-CM | POA: Insufficient documentation

## 2023-11-11 DIAGNOSIS — Z90710 Acquired absence of both cervix and uterus: Secondary | ICD-10-CM | POA: Diagnosis not present

## 2023-11-11 DIAGNOSIS — Z9079 Acquired absence of other genital organ(s): Secondary | ICD-10-CM | POA: Diagnosis not present

## 2023-11-11 NOTE — Patient Instructions (Signed)
It was good to see you today.  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  I will see you for follow-up in 6 months.  As always, if you develop any new and concerning symptoms before your next visit, please call to see me sooner.   

## 2023-11-11 NOTE — Progress Notes (Signed)
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 Evaluation of the pelvis is significantly limited by dense metallic streak artifact from adjacent hip arthroplasty. Within this limitation, there is expansile, masslike, heterogeneously enhancing appearance of the endometrium, measuring at least 5.2 cm in thickness. Findings are in keeping with reported diagnosis of endometrial malignancy. 2. No evidence of lymphadenopathy or metastatic disease in the chest, abdomen, or pelvis. 3. There is a 0.4 cm fissural nodule the superior segment left lower lobe, almost certainly a benign intrapulmonary lymph node. Attention on follow-up. 4. Coronary artery disease.   10/14/2021 Surgery   TRH/BSO, right SLN biopsy, left pelvic and PA LND, peritoneal nodule biopsy, mini-lap for specimen delivery  Findings: On EUA, 8cm bulbous uterus. ON intra-abdominal entry, normal upper abdominal survey. Normal omentum, small and large bowel. Uterus 8-10cm and bulbous. Normal appearing adnexa. Mapping successful on the right, no mapping on the left. Some mildly prominent lymph nodes bilaterally in the pelvis. Small, <5 mm, nodule in the cul de sac. No obvious intra-abdominal or pelvic evidence of disease.   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 SENTINEL LYMPH NODE, RIGHT EXTERNAL ILIAC,  BIOPSY:  - Lymph node, negative for carcinoma (0/1)   B. SENTINEL LYMPH NODE, RIGHT OBTURATOR AND SURROUNDING LYMPH NODES,  BIOPSY:  - Lymph nodes, negative for carcinoma (0/5)   C. UTERUS, CERVIX, BILATERAL FALLOPIAN TUBES AND OVARIES:  - Invasive high-grade serous carcinoma  - Carcinoma focally invades into the serosal surface  - Lymphovascular invasion is present  - Benign unremarkable cervix  - Benign unremarkable bilateral fallopian tubes and ovaries  - See oncology table   D. CUL DE SAC NODULE, POSTERIOR, BIOPSY:  - Endometriosis   E. LYMPH NODE, LEFT PELVIC, BIOPSY:  - Lymph nodes, negative for carcinoma (0/3)   F. LYMPH NODE, LEFT PARAORTIC, BIOPSY:  - Lymph nodes, negative for carcinoma (0/5)   ONCOLOGY TABLE:   UTERUS, CARCINOMA OR CARCINOSARCOMA: Resection   Procedure: Total hysterectomy and bilateral salpingo-oophorectomy  Histologic Type: Serous carcinoma  Histologic Grade: High-grade  Myometrial Invasion:       Depth of Myometrial Invasion (mm): 32 mm       Myometrial Thickness (mm): 32 mm       Percentage of Myometrial Invasion: 100%  Uterine Serosa Involvement: Present, focal  Cervical stromal Involvement: Not identified  Extent of involvement of other tissue/organs: Not identified  Peritoneal/Ascitic Fluid: Negative for carcinoma  Lymphovascular Invasion: Present  Regional Lymph Nodes:       Pelvic Lymph Nodes Examined:                                   6 Sentinel  3 Non-sentinel                                   9 Total       Pelvic Lymph Nodes with Metastasis: 0                           Macrometastasis: (>2.0 mm): 0                           Micrometastasis: (>0.2 mm and < 2.0 mm): 0                           Isolated Tumor Cells (<0.2 mm): 0                           Laterality of Lymph Node with Tumor: Not  applicable                           Extracapsular Extension: Not applicable       Para-aortic Lymph  Nodes Examined:                                    0 Sentinel                                    5 Non-sentinel                                    5 Total       Para-aortic Lymph Nodes with Metastasis: 0                           Macrometastasis: (>2.0 mm): 0                           Micrometastasis:  (>0.2 mm and < 2.0 mm): 0                           Isolated Tumor Cells (<0.2 mm): 0                           Laterality of Lymph Node with Tumor: Not  applicable                           Extracapsular Extension: Not applicable  Distant Metastasis:       Distant Site(s) Involved: Not applicable  Pathologic Stage Classification (pTNM, AJCC 8th Edition): pT3a, pN0  Ancillary Studies: MMR / MSI testing will be ordered  Representative Tumor Block: C4  Comment(s): Pancytokeratin was performed on the lymph nodes and is negative.   By immunohistochemistry, HER-2 is EQUIVOCAL (2+).  HER-2 by FISH is pending and will be reported in an addendum.   FLOURESCENCE IN-SITU HYBRIDIZATION RESULTS:   GROUP 1:  HER2 **POSITIVE**   On the tissue sample received from  this individual HER2 FISH was performed by a technologist and cell imaging and analysis on the [employer].   RATIO of HER2/CEN 17 SIGNALS: 2.50  AVERAGE HER2 COPY NUMBER PER CELL: 4.74   The ratio of HER2/CEN 17 result exceeds the cutoff value of >=2.0 and a copy number of HER2 signals exceeding the cutoff range of >=4.0 signals per cell.  Arch Pathol Lab Med 1:1, 2018.    10/28/2021 Cancer Staging   Staging form: Corpus Uteri - Carcinoma and Carcinosarcoma, AJCC 8th Edition - Pathologic stage from 10/28/2021: Stage III (pT3, pN0, cM0) - Signed by Artis Delay, MD on 10/28/2021 Stage prefix: Initial diagnosis   11/07/2021 Procedure   Successful placement of a right internal jugular approach power injectable Port-A-Cath. The catheter is ready for immediate use.   11/11/2021 - 02/26/2022 Chemotherapy   Patient is on Treatment Plan : UTERINE  Carboplatin AUC 6 / Paclitaxel q21d      Genetic Testing   Negative genetic testing. No pathogenic variants identified on the [facility] Multi-Cancer+RNA panel. VUS in MET called c.3220A>G, VUS in MUTYh called c.1417G>A and VUS in POLE called c.596G>T identified. The report date is 01/20/2022.  The Multi-Cancer Panel + RNA offered by [facility] includes sequencing and/or deletion duplication testing of the following 84 genes: AIP, ALK, APC, ATM, AXIN2,BAP1,  BARD1, BLM, BMPR1A, BRCA1, BRCA2, BRIP1, CASR, CDC73, CDH1, CDK4, CDKN1B, CDKN1C, CDKN2A (p14ARF), CDKN2A (p16INK4a), CEBPA, CHEK2, CTNNA1, DICER1, DIS3L2, EGFR (c.2369C>T, p.Thr790Met variant only), EPCAM (Deletion/duplication testing only), FH, FLCN, GATA2, GPC3, GREM1 (Promoter region deletion/duplication testing only), HOXB13 (c.251G>A, p.Gly84Glu), HRAS, KIT, MAX, MEN1, MET, MITF (c.952G>A, p.Glu318Lys variant only), MLH1, MSH2, MSH3, MSH6, MUTYH, NBN, NF1, NF2, NTHL1, PALB2, PDGFRA, PHOX2B, PMS2, POLD1, POLE, POT1, PRKAR1A, PTCH1, PTEN, RAD50, RAD51C, RAD51D, RB1, RECQL4, RET, RUNX1, SDHAF2, SDHA (sequence changes only), SDHB, SDHC, SDHD, SMAD4, SMARCA4, SMARCB1, SMARCE1, STK11, SUFU, TERC, TERT, TMEM127, TP53, TSC1, TSC2, VHL, WRN and WT1.   03/30/2022 Imaging   1. Bandlike opacity in the medial left lung apex is progressive in the interval, now with nodular component measuring up to 10 mm. While likely related to evolving atelectasis or scar, consider follow-up CT chest in 3 months to reassess. 2. Interval hysterectomy. 3. No evidence for metastatic disease in the chest, abdomen, or pelvis. 4. Trace free fluid in the upper pelvis, nonspecific. Of note, inferior pelvis is essentially obscured by beam hardening artifact from bilateral hip replacement. 5. Left colonic diverticulosis without diverticulitis. 6. Moderate stool volume. Imaging features could be compatible with constipation in the appropriate clinical setting. 7. Aortic Atherosclerosis  (ICD10-I70.0).   06/26/2022 Imaging   1. Increasing conspicuity and size of a previously bandlike and now nodular area of density in the LEFT upper lobe suspicious for metastatic process. PET scan and or biopsy could be helpful for further evaluation. 2. Stable small LEFT retropectoral lymph nodes. These warrant attention on follow-up imaging based on patient history. 3. Added density throughout the LEFT breast associated with surgical clips presumably postoperative and or post treatment related. 4. Aortic atherosclerosis.   Aortic Atherosclerosis (ICD10-I70.0).   10/29/2022 Imaging   1. The previously biopsied left upper lobe nodule is not significantly changed in size. There is a new fiducial marker along the inferior aspect of this nodule and a linear tail projecting posteriorly which may reflect a mucous impacted bronchus. Consider continued CT follow-up as clinically warranted. 2. No new or enlarging pulmonary nodules. No definite evidence of metastatic disease. 3. Interval resolution of previously demonstrated asymmetric increased density  in the left breast, likely postsurgical. 4. Aortic Atherosclerosis (ICD10-I70.0) and Emphysema (ICD10-J43.9).     06/10/2023 Imaging   CT CHEST ABDOMEN PELVIS W CONTRAST  Result Date: 06/09/2023 CLINICAL DATA:  Cervical cancer, assess treatment response, breast cancer * Tracking Code: BO * EXAM: CT CHEST, ABDOMEN, AND PELVIS WITH CONTRAST TECHNIQUE: Multidetector CT imaging of the chest, abdomen and pelvis was performed following the standard protocol during bolus administration of intravenous contrast. RADIATION DOSE REDUCTION: This exam was performed according to the departmental dose-optimization program which includes automated exposure control, adjustment of the mA and/or kV according to patient size and/or use of iterative reconstruction technique. CONTRAST:  OMNIPAQUE IOHEXOL 300 MG/ML  SOLN COMPARISON:  CT chest, 10/27/2022, CT chest abdomen  pelvis, 03/27/2022 PET-CT, 07/08/2022 FINDINGS: CT CHEST FINDINGS Cardiovascular: Aortic atherosclerosis. Normal heart size. Left coronary artery calcifications. No pericardial effusion. Mediastinum/Nodes: No enlarged mediastinal, hilar, or axillary lymph nodes. Thyroid gland, trachea, and esophagus demonstrate no significant findings. Lungs/Pleura: Unchanged bandlike scarring and consolidation of the medial left apex containing a biopsy marking clip (series 4, image 116). Unchanged, bland appearing bandlike scarring of the left lung base (series 4, image 95). No pleural effusion or pneumothorax. Musculoskeletal: Status post left lumpectomy. No acute osseous findings. CT ABDOMEN PELVIS FINDINGS Hepatobiliary: No solid liver abnormality is seen. Contracted gallbladder. No gallstones, gallbladder wall thickening, or biliary dilatation. Pancreas: Unremarkable. No pancreatic ductal dilatation or surrounding inflammatory changes. Spleen: Normal in size without significant abnormality. Adrenals/Urinary Tract: Adrenal glands are unremarkable. Kidneys are normal, without renal calculi, solid lesion, or hydronephrosis. Evaluation of the bladder is significantly limited by dense metallic streak artifact from adjacent bilateral hip total arthroplasty. No obvious abnormality. Stomach/Bowel: Stomach is within normal limits. Appendix appears normal. No evidence of bowel wall thickening, distention, or inflammatory changes. Pancolonic diverticulosis severe in the sigmoid. Vascular/Lymphatic: Aortic atherosclerosis. No enlarged abdominal or pelvic lymph nodes. Reproductive: Status post hysterectomy. Evaluation of the low pelvis is significantly limited by dense metallic streak artifact from bilateral hip arthroplasty. Other: No abdominal wall hernia or abnormality. No ascites. Musculoskeletal: No acute osseous findings. Status post bilateral hip total arthroplasty with dense metallic streak artifact. IMPRESSION: 1. Status post  hysterectomy. Evaluation of the low pelvis is significantly limited by dense metallic streak artifact from bilateral hip arthroplasty. Within this limitation, no obvious recurrent or metastatic disease in the pelvis. 2. No evidence of lymphadenopathy or metastatic disease in the chest, abdomen, or pelvis. 3. Unchanged bandlike scarring and consolidation of the medial left apex containing a biopsy marking clip, previously biopsied with reported benign infectious pathology. 4. Pancolonic diverticulosis. 5. Coronary artery disease. Aortic Atherosclerosis (ICD10-I70.0). Electronically Signed   By: Jearld Lesch M.D.   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  There are osteophytes in all 3 compartments and varus malalignment.  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  There is varus malalignment.  XR Lumbar Spine 2-3 Views  Result Date: 05/24/2023 2 views of the lumbar spine on show a degenerative scoliosis and degenerative changes at multiple levels.  There is no acute findings otherwise.     Breast cancer of upper-outer quadrant of left female breast (HCC)  11/19/2021 Imaging   IMPRESSION: 1. Highly suspicious mass in the  left breast at 1 o'clock measuring 1.9 cm.   2.  No mammographic evidence of malignancy in the right breast   11/27/2021 Procedure   Appropriate positioning of the venous shaped biopsy marking clip at the site of biopsy in the 1 o'clock location of the LEFT breast.   12/02/2021 Initial Diagnosis   Breast cancer of upper-outer quadrant of left female breast (HCC)   12/02/2021 Cancer Staging   Staging form: Breast, AJCC 8th Edition - Clinical stage from 12/02/2021: cT1c, cN0, cM0 - Signed by  Artis Delay, MD on 12/02/2021 Stage prefix: Initial diagnosis    Genetic Testing   Negative genetic testing. No pathogenic variants identified on the [facility] Multi-Cancer+RNA panel. VUS in MET called c.3220A>G, VUS in MUTYh called c.1417G>A and VUS in POLE called c.596G>T identified. The report date is 01/20/2022.  The Multi-Cancer Panel + RNA offered by [facility] includes sequencing and/or deletion duplication testing of the following 84 genes: AIP, ALK, APC, ATM, AXIN2,BAP1,  BARD1, BLM, BMPR1A, BRCA1, BRCA2, BRIP1, CASR, CDC73, CDH1, CDK4, CDKN1B, CDKN1C, CDKN2A (p14ARF), CDKN2A (p16INK4a), CEBPA, CHEK2, CTNNA1, DICER1, DIS3L2, EGFR (c.2369C>T, p.Thr790Met variant only), EPCAM (Deletion/duplication testing only), FH, FLCN, GATA2, GPC3, GREM1 (Promoter region deletion/duplication testing only), HOXB13 (c.251G>A, p.Gly84Glu), HRAS, KIT, MAX, MEN1, MET, MITF (c.952G>A, p.Glu318Lys variant only), MLH1, MSH2, MSH3, MSH6, MUTYH, NBN, NF1, NF2, NTHL1, PALB2, PDGFRA, PHOX2B, PMS2, POLD1, POLE, POT1, PRKAR1A, PTCH1, PTEN, RAD50, RAD51C, RAD51D, RB1, RECQL4, RET, RUNX1, SDHAF2, SDHA (sequence changes only), SDHB, SDHC, SDHD, SMAD4, SMARCA4, SMARCB1, SMARCE1, STK11, SUFU, TERC, TERT, TMEM127, TP53, TSC1, TSC2, VHL, WRN and WT1.   02/23/2022 Imaging   .WHO classification is NORMAL BONE MINERAL DENSITY.    03/30/2022 -  Anti-estrogen oral therapy   She is started on Arimidex   06/16/2022 Pathology Results   FINAL MICROSCOPIC DIAGNOSIS:   A. BREAST, LEFT, LUMPECTOMY:  -  Invasive mammary carcinoma (NST)/invasive ductal carcinoma (NOS) [retained membranous e-cadherin positivity], 1.7 cm in greatest dimension, Nottingham histologic score 2 out of 3.  -  Ductal carcinoma in situ (DCIS), cribriform type, nuclear grade 2 of 3.  -  Biomarkers, performed on the biopsy specimen, ER positive (90% strong), PR positive (11 to 50% moderate), HER2/neu negative  -  Margins negative in conjunction with parts a B and C (closest  margin anterior at 5 mm and superior at 9 mm all others greater than 10 mm).  pT1c pNX pM n/a   B. BREAST, LEFT POSTERIOR MARGIN, EXCISION:  -  Benign breast tissue, negative for malignancy (new margin 1.5 cm)   C. BREAST, LEFT MEDIAL MARGIN, EXCISION:  -  Benign breast tissue, negative for malignancy (new margin 1.5 cm)   ONCOLOGY TABLE:   INVASIVE CARCINOMA OF THE BREAST:  Resection   Procedure: Lumpectomy  Specimen Laterality: Left  Histologic Type: Invasive mammary carcinoma of no special type  (NST)/invasive ductal carcinoma (NOS)  Histologic Grade:       Glandular (Acinar)/Tubular Differentiation: 3       Nuclear Pleomorphism: 2       Mitotic Rate: 1       Overall Grade: 2  Tumor Size: 1.7 x 1.5 x 1.0 cm  Ductal Carcinoma In Situ: Cribriform type, nuclear grade 2 of 3  Tumor Extent: Limited to breast parenchyma  Treatment Effect in the Breast: It is noted the patient has received  chemotherapy for endometrial carcinoma as well as antrum aromatase  inhibitor without morphologic evidence of tumor response  Margins: All margins negative for invasive carcinoma  Distance from Closest Margin (mm): 5 mm and 9 mm       Specify Closest Margin (required only if <97mm): Anterior (5) and  Superior (9)  DCIS Margins: Uninvolved by DCIS       Distance from Closest Margin (mm): 5 mm       Specify Closest Margin (required only if <66mm): Posterior  Regional Lymph Nodes: Not applicable (no lymph nodes submitted or found)       Number of Lymph Nodes Examined: 0       Number of Sentinel Nodes Examined: 0       Number of Lymph Nodes with Macrometastases (>2 mm): N/A       Number of Lymph Nodes with Micrometastases: N/A       Number of Lymph Nodes with Isolated Tumor Cells (=0.2 mm or =200  cells): N/A       Size of Largest Metastatic Deposit (mm): N/A       Extranodal Extension: N/A  Distant Metastasis:       Distant Site(s) Involved: N/A  Breast Biomarker Testing Performed on  Previous Biopsy:       Testing Performed on Case Number: [id number]             Estrogen Receptor: Positive, >90% strong             Progesterone Receptor: Positive, 11 to 50% moderate             HER2: IHC 0/3; negative             Ki-67: Not performed  Pathologic Stage Classification (pTNM, AJCC 8th Edition): pT1c, pNx  Representative Tumor Block: A5  Comment(s): [None]    06/16/2022 Surgery   Preoperative diagnosis: Left breast cancer, clinical stage I  Procedure: Left breast radioactive seed guided lumpectomy Surgeon: Dr. Harden Mo   07/09/2022 PET scan   1. Hypermetabolic apical left upper lobe nodule, worrisome for primary bronchogenic carcinoma. Metastatic disease is also possibility. 2. Mild residual hypermetabolism associated with the left breast, likely postoperative/post treatment in etiology. Difficult to exclude residual disease. 3. Punctate left renal stone.  4. Aortic atherosclerosis (ICD10-I70.0). coronary artery calcification.   07/20/2022 Pathology Results   A. LUNG, LEFT UPPER LOBE, NEEDLE CORE BIOPSY:  - Necrotizing granulomatous inflammation.  See comment.   COMMENT:   AFB, PAS-F, and GMS special stains are negative for organisms.    02/25/2023 Mammogram   MM DIAG BREAST TOMO BILATERAL  Result Date: 02/25/2023 CLINICAL DATA:  Patient is status post lumpectomy of the LEFT breast in July 2023 for [facility]. Patient could not tolerate Arimidex. Patient deferred sentinel node sampling as well as radiation. Patient with a history endometrial cancer in 2022 as well; she underwent chemotherapy for this with slight decrease size of the breast cancer prior to surgical excision. EXAM: DIGITAL DIAGNOSTIC BILATERAL MAMMOGRAM WITH TOMOSYNTHESIS TECHNIQUE: Bilateral digital diagnostic mammography and breast tomosynthesis was performed. COMPARISON:  Previous exam(s). ACR Breast Density Category c: The breasts are heterogeneously dense, which may obscure small masses. FINDINGS:  There is density and architectural distortion within the LEFT breast, consistent with postsurgical changes. These are new in comparison to prior. A questioned asymmetry in the RIGHT breast resolved with additional views, consistent with overlapping tissue. No suspicious mass, distortion, or microcalcifications are identified to suggest presence of malignancy. IMPRESSION: No mammographic evidence of malignancy bilaterally. RECOMMENDATION: Recommend bilateral diagnostic mammogram (with RIGHT and LEFT breast ultrasound if deemed necessary) in 1 year. I have discussed the findings and  recommendations with the patient. If applicable, a reminder letter will be sent to the patient regarding the next appointment. BI-RADS CATEGORY  2: Benign. Electronically Signed   By: Meda Klinefelter M.D.   On: 02/25/2023 14:01       Interval History: Overall doing well although not feeling great today.  Denies any vaginal bleeding or discharge.  Reports continued baseline intermittent constipation and diarrhea, finding Imodium more helpful.  Denies any change to urinary symptoms.  Denies any significant abdominal or pelvic pain.  Past Medical/Surgical History: Past Medical History:  Diagnosis Date   Actinic keratosis 06/04/2021   right anteromedial thigh   Anemia    Arthritis    Basal cell carcinoma 06/04/2021   right suprapubic, EDC 07/29/2021   Basal cell carcinoma 07/29/2021   left nasal ala. atypical basaloid cells   Basal cell carcinoma 03/04/2023   Right lateral cheek. Nodular. Referral to radiation oncology.   BCC (basal cell carcinoma of skin) 12/03/2022   superficial right superior shoulder, 03/04/23  ED&C   Family history of lung cancer    GERD (gastroesophageal reflux disease)    History of basal cell carcinoma 07/29/2021   left thigh, EDC at time of bx   History of radiation therapy    vaginal brachytherapy [facility] 12/18/2021-01/13/2022 Dr Antony Blackbird   HLD (hyperlipidemia)    Hypertension     Hypothyroidism    Pneumonia    HX OF YEARS AGO   PONV (postoperative nausea and vomiting)     Past Surgical History:  Procedure Laterality Date   BREAST BIOPSY Left 11/27/2021   u/s bs, venus clip,  INVASIVE MAMMARY CARCINOMA   BREAST CYST ASPIRATION     BREAST LUMPECTOMY Left 06/16/2022   BREAST LUMPECTOMY WITH RADIOACTIVE SEED LOCALIZATION Left 06/16/2022   Procedure: LEFT BREAST LUMPECTOMY WITH RADIOACTIVE SEED LOCALIZATION;  Surgeon: Emelia Loron, MD;  Location: [facility];  Service: General;  Laterality: Left;   BRONCHIAL BIOPSY  07/10/2022   Procedure: BRONCHIAL BIOPSIES;  Surgeon: Omar Person, MD;  Location: [facility] ENDOSCOPY;  Service: Pulmonary;;   BRONCHIAL BRUSHINGS  07/10/2022   Procedure: BRONCHIAL BRUSHINGS;  Surgeon: Omar Person, MD;  Location: [facility] ENDOSCOPY;  Service: Pulmonary;;   BRONCHIAL NEEDLE ASPIRATION BIOPSY  07/10/2022   Procedure: BRONCHIAL NEEDLE ASPIRATION BIOPSIES;  Surgeon: Omar Person, MD;  Location: [facility] ENDOSCOPY;  Service: Pulmonary;;   BRONCHIAL WASHINGS  07/10/2022   Procedure: BRONCHIAL WASHINGS;  Surgeon: Omar Person, MD;  Location[facility] ENDOSCOPY;  Service: Pulmonary;;   cataract Bilateral    FIDUCIAL MARKER PLACEMENT  07/10/2022   Procedure: FIDUCIAL MARKER PLACEMENT;  Surgeon: Omar Person, MD;  Location: [facility] ENDOSCOPY;  Service: Pulmonary;;   HIP SURGERY     IR IMAGING GUIDED PORT INSERTION  11/07/2021   IR REMOVAL TUN ACCESS W/ PORT W/O FL MOD SED  01/06/2022   KNEE ARTHROSCOPY Left 2003   NECK SURGERY  1997   Fusion C3, C4, C5   ROBOTIC ASSISTED TOTAL HYSTERECTOMY WITH BILATERAL SALPINGO OOPHERECTOMY Bilateral 10/14/2021   Procedure: XI ROBOTIC ASSISTED TOTAL HYSTERECTOMY WITH BILATERAL SALPINGO OOPHORECTOMY, LYMPH NODE DISSECTION,  LAPAROTOMY;  Surgeon: Carver Fila, MD;  Location: [facility];  Service: Gynecology;  Laterality: Bilateral;   SENTINEL NODE BIOPSY N/A 10/14/2021   Procedure:  SENTINEL NODE DISSECTION;  Surgeon: Carver Fila, MD;  Location: [facility];  Service: Gynecology;  Laterality: N/A;   THYROIDECTOMY  2007   TOTAL HIP ARTHROPLASTY Bilateral    Left  Hip -2015, Right Hip 208   VIDEO BRONCHOSCOPY WITH RADIAL ENDOBRONCHIAL ULTRASOUND  07/10/2022   Procedure: RADIAL ENDOBRONCHIAL ULTRASOUND;  Surgeon: Omar Person, MD;  Location: [facility] ENDOSCOPY;  Service: Pulmonary;;    Family History  Problem Relation Age of Onset   Cancer Father        Lung Cancer   Lung cancer Father        smoked   Skin cancer Maternal Grandmother    Skin cancer Paternal Grandmother    Cancer Paternal Aunt        mouth   Colon cancer Neg Hx    Breast cancer Neg Hx    Ovarian cancer Neg Hx    Endometrial cancer Neg Hx    Pancreatic cancer Neg Hx    Prostate cancer Neg Hx     Social History   Socioeconomic History   Marital status: Married    Spouse name: Molly Maduro   Number of children: 2   Years of education: Not on file   Highest education level: Not on file  Occupational History   Not on file  Tobacco Use   Smoking status: Former    Current packs/day: 0.00    Average packs/day: 0.5 packs/day for 3.0 years (1.5 ttl pk-yrs)    Types: Cigarettes    Start date: 12/01/1963    Quit date: 11/30/1966    Years since quitting: 56.9   Smokeless tobacco: Never  Vaping Use   Vaping status: Never Used  Substance and Sexual Activity   Alcohol use: Yes    Comment: RARE   Drug use: Never   Sexual activity: Not Currently  Other Topics Concern   Not on file  Social History Narrative   Retired Charity fundraiser   Social Drivers of Corporate investment banker Strain: Low Risk  (11/08/2023)   Received from [facility]   Overall Financial Resource Strain (CARDIA)    Difficulty of Paying Living Expenses: Not hard at all  Food Insecurity: No Food Insecurity (11/08/2023)   Received from [facility]   Hunger Vital Sign    Worried About Running Out of Food in  the Last Year: Never true    Ran Out of Food in the Last Year: Never true  Transportation Needs: No Transportation Needs (11/08/2023)   Received from [facility] - Transportation    In the past 12 months, has lack of transportation kept you from medical appointments or from getting medications?: No    Lack of Transportation (Non-Medical): No  Physical Activity: Insufficiently Active (10/08/2021)   Received from [facility], [facility]   Exercise Vital Sign    Days of Exercise per Week: 1 day    Minutes of Exercise per Session: 30 min  Stress: Stress Concern Present (10/08/2021)   Received from [facility], [facility] - Occupational Stress Questionnaire    Feeling of Stress : Rather much  Social Connections: Socially Integrated (10/08/2021)   Received from [facility], [facility]   Social Connection and Isolation Panel [NHANES]    Frequency of Communication with Friends and Family: More than three times a week    Frequency of Social Gatherings with Friends and Family: More than three times a week    Attends Religious Services: More than 4 times per year    Active Member of Clubs or Organizations: Yes    Attends [employer]  or Organization Meetings: More than 4 times per year    Marital Status: Married    Current Medications:  Current Outpatient Medications:    acetaminophen (TYLENOL) 500 MG tablet, Take 1,000 mg by mouth every 6 (six) hours as needed (pain.)., Disp: , Rfl:    celecoxib (CELEBREX) 200 MG capsule, Take 200 mg by mouth daily., Disp: , Rfl:    Cholecalciferol (VITAMIN D3) 50 MCG (2000 UT) TABS, Take 2,000 Units by mouth in the morning., Disp: , Rfl:    gabapentin (NEURONTIN) 100 MG capsule, Take 100 mg by mouth at bedtime., Disp: , Rfl:    levothyroxine (SYNTHROID) 125 MCG tablet, Take 125 mcg by mouth daily before breakfast., Disp: , Rfl:    loperamide (IMODIUM A-D) 2 MG tablet, 1/2  tablet by mouth every other day, Disp: 45 tablet, Rfl: 3   magnesium oxide (MAG-OX) 400 MG tablet, Take 400 mg by mouth at bedtime., Disp: , Rfl:    Multiple Vitamin (MULTIVITAMIN WITH MINERALS) TABS tablet, Take 1 tablet by mouth in the morning., Disp: , Rfl:    Omega-3 1000 MG CAPS, Take 1,000-2,000 mg by mouth See admin instructions. Take 2 capsules by mouth in the morning & take 1 capsule by mouth at night., Disp: , Rfl:    pantoprazole (PROTONIX) 20 MG tablet, Take 20 mg by mouth daily as needed for heartburn or indigestion., Disp: , Rfl:    propranolol (INDERAL) 40 MG tablet, Take 40 mg by mouth 3 (three) times daily., Disp: , Rfl:    Simethicone 125 MG TABS, Take 250 mg by mouth 2 (two) times daily as needed (gas)., Disp: , Rfl:    temazepam (RESTORIL) 15 MG capsule, Take 15 mg by mouth at bedtime as needed for sleep., Disp: , Rfl:    venlafaxine XR (EFFEXOR-XR) 37.5 MG 24 hr capsule, Take 37.5 mg by mouth daily with breakfast., Disp: , Rfl:    rosuvastatin (CRESTOR) 5 MG tablet, Take 5 mg by mouth at bedtime., Disp: , Rfl:   Review of Systems: + Fatigue, ringing in ears, vision problems, constipation, diarrhea, urinary incontinence, joint problems, difficulty with walking Denies appetite changes, fevers, chills, unexplained weight changes. Denies hearing loss, neck lumps or masses, mouth sores or voice changes. Denies cough or wheezing.  Denies shortness of breath. Denies chest pain or palpitations. Denies leg swelling. Denies abdominal distention, pain, blood in stools, nausea, vomiting, or early satiety. Denies pain with intercourse, dysuria, frequency, hematuria. Denies hot flashes, pelvic pain, vaginal bleeding or vaginal discharge.   Denies back pain or muscle pain/cramps. Denies itching, rash, or wounds. Denies dizziness, headaches, numbness or seizures. Denies swollen lymph nodes or glands, denies easy bruising or bleeding. Denies anxiety, depression, confusion, or decreased  concentration.  Physical Exam: BP (!) 148/72 (BP Location: Left Arm, Patient Position: Sitting) Comment: Notified RN  Pulse 62   Temp 98.6 F (37 C) (Oral)   Resp 20   Wt 193 lb 6.4 oz (87.7 kg)   SpO2 96%   BMI 34.26 kg/m  General: Alert, oriented, no acute distress. HEENT: Normocephalic, atraumatic, sclera anicteric. Chest: Clear to auscultation bilaterally.  No wheezes or rhonchi. Cardiovascular: Regular rate and rhythm, no murmurs. Abdomen: Obese, soft, nontender.  Normoactive bowel sounds.  No masses or hepatosplenomegaly appreciated.  Well-healed incisions. Extremities: Grossly normal range of motion.  Warm, well perfused.  1+ edema bilaterally. Skin: No rashes or lesions noted. Lymphatics: No cervical, supraclavicular, or inguinal adenopathy. GU: Normal appearing external genitalia without erythema, excoriation,  or lesions.  Speculum exam reveals mildly atrophic vaginal mucosa, radiation changes present.  No bleeding or discharge.  Small urethral carbuncle noted.  Bimanual exam reveals no masses or nodularity.    Laboratory & Radiologic Studies: 05/2023: CT C/A/P: 1. Status post hysterectomy. Evaluation of the low pelvis is significantly limited by dense metallic streak artifact from bilateral hip arthroplasty. Within this limitation, no obvious recurrent or metastatic disease in the pelvis.  2. No evidence of lymphadenopathy or metastatic disease in the chest, abdomen, or pelvis. 3. Unchanged bandlike scarring and consolidation of the medial left apex containing a biopsy marking clip, previously biopsied with reported benign infectious pathology. 4. Pancolonic diverticulosis. 5. Coronary artery disease.  Assessment & Plan: Kristin Carlson is a 82 y.o. 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 Completed adjuvant chemotherapy end 01/2022, VBT completed 01/13/22.  MMR intact, HER2+.  Genetic testing with no pathogenic mutations, VUS in MET, MUTYH,  and POLE.  Last imaging in July 2024 negative for recurrent disease.  She is overall doing very well and is NED on exam today.  She sees Dr. Bertis Ruddy in January and Dr. Roselind Messier in March.  Per NCCN surveillance recommendations, we will continue to alternate visits every 3 months between our office and radiation oncology for 2-3 years after completion of adjuvant therapy.  We discussed signs and symptoms that would be concerning for cancer recurrence, and I stressed the importance of calling if she develops any of these.  20 minutes of total time was spent for this patient encounter, including preparation, face-to-face counseling with the patient and coordination of care, and documentation of the encounter.  Eugene Garnet, MD  Division of Gynecologic Oncology  Department of Obstetrics and Gynecology  [facility]

## 2023-11-15 ENCOUNTER — Ambulatory Visit (INDEPENDENT_AMBULATORY_CARE_PROVIDER_SITE_OTHER): Payer: Medicare Other | Admitting: Physician Assistant

## 2023-11-15 ENCOUNTER — Encounter: Payer: Self-pay | Admitting: Physician Assistant

## 2023-11-15 DIAGNOSIS — M170 Bilateral primary osteoarthritis of knee: Secondary | ICD-10-CM

## 2023-11-15 DIAGNOSIS — M1712 Unilateral primary osteoarthritis, left knee: Secondary | ICD-10-CM

## 2023-11-15 DIAGNOSIS — M1711 Unilateral primary osteoarthritis, right knee: Secondary | ICD-10-CM

## 2023-11-15 MED ORDER — LIDOCAINE HCL 1 % IJ SOLN
3.0000 mL | INTRAMUSCULAR | Status: AC | PRN
Start: 2023-11-15 — End: 2023-11-15
  Administered 2023-11-15: 3 mL

## 2023-11-15 MED ORDER — METHYLPREDNISOLONE ACETATE 40 MG/ML IJ SUSP
40.0000 mg | INTRAMUSCULAR | Status: AC | PRN
Start: 2023-11-15 — End: 2023-11-15
  Administered 2023-11-15: 40 mg via INTRA_ARTICULAR

## 2023-11-15 NOTE — Progress Notes (Signed)
   Procedure Note  Patient: Kristin Carlson             Date of Birth: 06-29-1941           MRN: [id number]             Visit Date: 11/15/2023 HPI: Patient is 82 year old female well-known Dr. Raye Sorrow service comes in today requesting steroid injections both knees.  She states the last injections on 08/09/2023 did not give her as much relief as prior injections have.  She feels that she went shopping too soon after the last injections.  She has had no new injury.  She is nondiabetic.  She does take Celebrex.  Review of systems: Negative for fevers chills. Physical exam: General Well-developed well-nourished female no acute distress mood and affect appropriate. Bilateral knees: No instability valgus varus stressing of either knee.  Good range of motion of both knees.  Slight varus malalignment bilaterally.  Procedures: Visit Diagnoses:  1. Unilateral primary osteoarthritis, right knee   2. Unilateral primary osteoarthritis, left knee     Large Joint Inj: bilateral knee on 11/15/2023 5:00 PM Indications: pain Details: 22 G 1.5 in needle, anterolateral approach  Arthrogram: No  Medications (Right): 3 mL lidocaine 1 %; 40 mg methylPREDNISolone acetate 40 MG/ML Medications (Left): 3 mL lidocaine 1 %; 40 mg methylPREDNISolone acetate 40 MG/ML Outcome: tolerated well, no immediate complications Procedure, treatment alternatives, risks and benefits explained, specific risks discussed. Consent was given by the patient. Immediately prior to procedure a time out was called to verify the correct patient, procedure, equipment, support staff and site/side marked as required. Patient was prepped and draped in the usual sterile fashion.     Plan: She knows to wait at least 3 months between injections.  Questions were encouraged and answered at length.  She did ask about knee surgery she states she is thinking of having knee surgery sometime next year.  Did do reviewed the postoperative and perioperative  protocol with her.  Discussed risk benefits of surgery.  Questions were encouraged and answered at length.

## 2023-11-16 ENCOUNTER — Encounter: Payer: Self-pay | Admitting: Dermatology

## 2023-11-16 ENCOUNTER — Ambulatory Visit: Payer: Medicare Other | Admitting: Dermatology

## 2023-11-16 DIAGNOSIS — C44311 Basal cell carcinoma of skin of nose: Secondary | ICD-10-CM | POA: Diagnosis not present

## 2023-11-16 DIAGNOSIS — C44319 Basal cell carcinoma of skin of other parts of face: Secondary | ICD-10-CM

## 2023-11-16 DIAGNOSIS — L578 Other skin changes due to chronic exposure to nonionizing radiation: Secondary | ICD-10-CM

## 2023-11-16 DIAGNOSIS — L820 Inflamed seborrheic keratosis: Secondary | ICD-10-CM

## 2023-11-16 DIAGNOSIS — Z872 Personal history of diseases of the skin and subcutaneous tissue: Secondary | ICD-10-CM

## 2023-11-16 DIAGNOSIS — Z86018 Personal history of other benign neoplasm: Secondary | ICD-10-CM

## 2023-11-16 DIAGNOSIS — Z85828 Personal history of other malignant neoplasm of skin: Secondary | ICD-10-CM

## 2023-11-16 DIAGNOSIS — W908XXA Exposure to other nonionizing radiation, initial encounter: Secondary | ICD-10-CM

## 2023-11-16 DIAGNOSIS — D1801 Hemangioma of skin and subcutaneous tissue: Secondary | ICD-10-CM

## 2023-11-16 DIAGNOSIS — Z1283 Encounter for screening for malignant neoplasm of skin: Secondary | ICD-10-CM

## 2023-11-16 DIAGNOSIS — D229 Melanocytic nevi, unspecified: Secondary | ICD-10-CM

## 2023-11-16 DIAGNOSIS — L814 Other melanin hyperpigmentation: Secondary | ICD-10-CM | POA: Diagnosis not present

## 2023-11-16 DIAGNOSIS — L821 Other seborrheic keratosis: Secondary | ICD-10-CM

## 2023-11-16 NOTE — Patient Instructions (Addendum)
 Cryotherapy Aftercare  Wash gently with soap and water everyday.   Apply Vaseline jelly daily until healed.    Recommend daily broad spectrum sunscreen SPF 30+ to sun-exposed areas, reapply every 2 hours as needed. Call for new or changing lesions.  Staying in the shade or wearing long sleeves, sun glasses (UVA+UVB protection) and wide brim hats (4-inch brim around the entire circumference of the hat) are also recommended for sun protection.      Melanoma ABCDEs  Melanoma is the most dangerous type of skin cancer, and is the leading cause of death from skin disease.  You are more likely to develop melanoma if you: Have light-colored skin, light-colored eyes, or red or blond hair Spend a lot of time in the sun Tan regularly, either outdoors or in a tanning bed Have had blistering sunburns, especially during childhood Have a close family member who has had a melanoma Have atypical moles or large birthmarks  Early detection of melanoma is key since treatment is typically straightforward and cure rates are extremely high if we catch it early.   The first sign of melanoma is often a change in a mole or a new dark spot.  The ABCDE system is a way of remembering the signs of melanoma.  A for asymmetry:  The two halves do not match. B for border:  The edges of the growth are irregular. C for color:  A mixture of colors are present instead of an even brown color. D for diameter:  Melanomas are usually (but not always) greater than 6mm - the size of a pencil eraser. E for evolution:  The spot keeps changing in size, shape, and color.  Please check your skin once per month between visits. You can use a small mirror in front and a large mirror behind you to keep an eye on the back side or your body.   If you see any new or changing lesions before your next follow-up, please call to schedule a visit.  Please continue daily skin protection including broad spectrum sunscreen SPF 30+ to  sun-exposed areas, reapplying every 2 hours as needed when you're outdoors.   Staying in the shade or wearing long sleeves, sun glasses (UVA+UVB protection) and wide brim hats (4-inch brim around the entire circumference of the hat) are also recommended for sun protection.      Due to recent changes in healthcare laws, you may see results of your pathology and/or laboratory studies on MyChart before the doctors have had a chance to review them. We understand that in some cases there may be results that are confusing or concerning to you. Please understand that not all results are received at the same time and often the doctors may need to interpret multiple results in order to provide you with the best plan of care or course of treatment. Therefore, we ask that you please give [place] 2 business days to thoroughly review all your results before contacting the office for clarification. Should we see a critical lab result, you will be contacted sooner.   If You Need Anything After Your Visit  If you have any questions or concerns for your doctor, please call our main line at [phone] and press option 4 to reach your doctor's medical assistant. If no one answers, please leave a voicemail as directed and we will return your call as soon as possible. Messages left after 4 pm will be answered the following business day.   You may also send [place]  a message via MyChart. We typically respond to MyChart messages within 1-2 business days.  For prescription refills, please ask your pharmacy to contact our office. Our fax number is [phone].  If you have an urgent issue when the clinic is closed that cannot wait until the next business day, you can page your doctor at the number below.    Please note that while we do our best to be available for urgent issues outside of office hours, we are not available 24/7.   If you have an urgent issue and are unable to reach [place], you may choose to seek medical care at  your doctor's office, retail clinic, urgent care center, or emergency room.  If you have a medical emergency, please immediately call 911 or go to the emergency department.  Pager Numbers  - Dr. Gwen Pounds: [phone]  - Dr. Roseanne Reno: [phone]  - Dr. Katrinka Blazing: [phone]   In the event of inclement weather, please call our main line at [phone] for an update on the status of any delays or closures.  Dermatology Medication Tips: Please keep the boxes that topical medications come in in order to help keep track of the instructions about where and how to use these. Pharmacies typically print the medication instructions only on the boxes and not directly on the medication tubes.   If your medication is too expensive, please contact our office at [phone] option 4 or send [place] a message through MyChart.   We are unable to tell what your co-pay for medications will be in advance as this is different depending on your insurance coverage. However, we may be able to find a substitute medication at lower cost or fill out paperwork to get insurance to cover a needed medication.   If a prior authorization is required to get your medication covered by your insurance company, please allow [place] 1-2 business days to complete this process.  Drug prices often vary depending on where the prescription is filled and some pharmacies may offer cheaper prices.  The website www.goodrx.com contains coupons for medications through different pharmacies. The prices here do not account for what the cost may be with help from insurance (it may be cheaper with your insurance), but the website can give you the price if you did not use any insurance.  - You can print the associated coupon and take it with your prescription to the pharmacy.  - You may also stop by our office during regular business hours and pick up a GoodRx coupon card.  - If you need your prescription sent electronically to a different pharmacy,  notify our office through [facility] or by phone at [phone] option 4.     Si Usted Necesita Algo Despus de Su Visita  Tambin puede enviarnos un mensaje a travs de Clinical cytogeneticist. Por lo general respondemos a los mensajes de MyChart en el transcurso de 1 a 2 das hbiles.  Para renovar recetas, por favor pida a su farmacia que se ponga en contacto con nuestra oficina. Annie Sable de fax es [place] [phone].  Si tiene un asunto urgente cuando la clnica est cerrada y que no puede esperar hasta el siguiente da hbil, puede llamar/localizar a su doctor(a) al nmero que aparece a continuacin.   Por favor, tenga en cuenta que aunque hacemos todo lo posible para estar disponibles para asuntos urgentes fuera del horario de [place], no estamos disponibles las 24 horas del da, los 7 [address]  [address] de la [place].   Si tiene un  problema urgente y no puede comunicarse con nosotros, puede optar por buscar atencin mdica  en el consultorio de su doctor(a), en una clnica privada, en un centro de atencin urgente o en una sala de emergencias.  Si tiene Engineer, drilling, por favor llame inmediatamente al 911 o vaya a la sala de emergencias.  Nmeros de bper  - Dr. Gwen Pounds: [phone]  - Dra. Roseanne Reno: [phone]  - Dr. Katrinka Blazing: [phone]   En caso de inclemencias del tiempo, por favor llame a Lacy Duverney principal al [phone] para una actualizacin sobre el [place] de cualquier retraso o cierre.  Consejos para la medicacin en dermatologa: Por favor, guarde las cajas en las que vienen los medicamentos de uso tpico para ayudarle a seguir las instrucciones sobre dnde y cmo usarlos. Las farmacias generalmente imprimen las instrucciones del medicamento slo en las cajas y no directamente en los tubos del [place].   Si su medicamento es muy caro, por favor, pngase en contacto con Rolm [place]ala llamando al [phone] y presione la opcin 4 o envenos un mensaje a travs de  Clinical cytogeneticist.   No podemos decirle cul ser su copago por los medicamentos por adelantado ya que esto es diferente dependiendo de la cobertura de su seguro. Sin embargo, es posible que podamos encontrar un medicamento sustituto a Audiological scientist un formulario para que el seguro cubra el medicamento que se considera necesario.   Si se requiere una autorizacin previa para que su compaa de seguros [place] su medicamento, por favor permtanos de 1 a 2 das hbiles para completar [address]  Los precios de los medicamentos varan con frecuencia dependiendo del Environmental consultant de dnde se surte la receta y alguna farmacias pueden ofrecer precios ms baratos.  El sitio web www.goodrx.com tiene cupones para medicamentos de Health and safety inspector. Los precios aqu no tienen en cuenta lo que podra costar con la ayuda del seguro (puede ser ms barato con su seguro), pero el sitio web puede darle el precio si no utiliz Tourist information centre manager.  - Puede imprimir el cupn correspondiente y llevarlo con su receta a la farmacia.  - Tambin puede pasar por nuestra oficina durante el horario de atencin regular y Education officer, museum una tarjeta de cupones de GoodRx.  - Si necesita que su receta se enve electrnicamente a una farmacia diferente, informe a nuestra oficina a travs de MyChart de [place] o por telfono llamando al [phone] y presione la opcin 4.

## 2023-11-16 NOTE — Progress Notes (Signed)
Follow-Up Visit   Subjective  Kristin Carlson is a 82 y.o. female who presents for the following: Skin Cancer Screening and Full Body Skin Exam. Hx of AKs. Hx of BCCs. Two BCCs have not been treated yet.   Irritated skin lesion on the L thigh and R shoulder, itches, burns, would like checked today.  The patient presents for Total-Body Skin Exam (TBSE) for skin cancer screening and mole check. The patient has spots, moles and lesions to be evaluated, some may be new or changing and the patient may have concern these could be cancer.  The following portions of the chart were reviewed this encounter and updated as appropriate: medications, allergies, medical history  Review of Systems:  No other skin or systemic complaints except as noted in HPI or Assessment and Plan.  Objective  Well appearing patient in no apparent distress; mood and affect are within normal limits.  A full examination was performed including scalp, head, eyes, ears, nose, lips, neck, chest, axillae, abdomen, back, buttocks, bilateral upper extremities, bilateral lower extremities, hands, feet, fingers, toes, fingernails, and toenails. All findings within normal limits unless otherwise noted below.   Relevant physical exam findings are noted in the Assessment and Plan.  chest x8 (8) Erythematous keratotic or waxy stuck-on papule or plaque.  Assessment & Plan   SKIN CANCER SCREENING PERFORMED TODAY.  ACTINIC DAMAGE - Chronic condition, secondary to cumulative UV/sun exposure - diffuse scaly erythematous macules with underlying dyspigmentation - Recommend daily broad spectrum sunscreen SPF 30+ to sun-exposed areas, reapply every 2 hours as needed.  - Staying in the shade or wearing long sleeves, sun glasses (UVA+UVB protection) and wide brim hats (4-inch brim around the entire circumference of the hat) are also recommended for sun protection.  - Call for new or changing lesions.  LENTIGINES, SEBORRHEIC KERATOSES,  HEMANGIOMAS - Benign normal skin lesions - Benign-appearing - Call for any changes  MELANOCYTIC NEVI - Tan-brown and/or pink-flesh-colored symmetric macules and papules - Benign appearing on exam today - Observation - Call clinic for new or changing moles - Recommend daily use of broad spectrum spf 30+ sunscreen to sun-exposed areas.   HISTORY OF BASAL CELL CARCINOMA OF THE SKIN - No evidence of recurrence today - Recommend regular full body skin exams - Recommend daily broad spectrum sunscreen SPF 30+ to sun-exposed areas, reapply every 2 hours as needed.  - Call if any new or changing lesions are noted between office visits  Basal cell carcinoma  left nasal ala, right lateral cheek   She is planning Mohs once her husband is better or has passed. She scheduled but her husband went back in the hospital with poor prognosis. He is out again and doing better now, but the hospitalizations have been frequent. We have discussed other options including ED&C, radiation, topical therapy in depth. She prefers to wait and have Mohs done when she is able.    Exam: no active BCC or signs of recurrence at sites today. Appear to be well healed scars.  Treatment:  Watch for recurrence.       HISTORY OF DYSPLASTIC NEVUS No evidence of recurrence today Recommend regular full body skin exams Recommend daily broad spectrum sunscreen SPF 30+ to sun-exposed areas, reapply every 2 hours as needed.  Call if any new or changing lesions are noted between office visits INFLAMED SEBORRHEIC KERATOSIS (8) chest x8 (8) Symptomatic, irritating, patient would like treated. Destruction of lesion - chest x8 (8) Complexity: simple   Destruction method:  cryotherapy   Informed consent: discussed and consent obtained   Timeout:  patient name, date of birth, surgical site, and procedure verified Lesion destroyed using liquid nitrogen: Yes   Region frozen until ice ball extended beyond lesion: Yes   Cryo cycles:  1 or 2. Outcome: patient tolerated procedure well with no complications   Post-procedure details: wound care instructions given   Additional details:  Prior to procedure, discussed risks of blister formation, small wound, skin dyspigmentation, or rare scar following cryotherapy. Recommend Vaseline ointment to treated areas while healing.    Return in about 1 year (around 11/15/2024) for TBSE, HxBCCs, HxAKs .  I, Lawson Radar, CMA, am acting as scribe for Elie Goody, MD.    Documentation: I have reviewed the above documentation for accuracy and completeness, and I agree with the above.  Elie Goody, MD

## 2023-11-18 ENCOUNTER — Other Ambulatory Visit: Payer: Self-pay | Admitting: Hematology and Oncology

## 2023-11-18 ENCOUNTER — Telehealth: Payer: Self-pay

## 2023-11-18 DIAGNOSIS — C541 Malignant neoplasm of endometrium: Secondary | ICD-10-CM

## 2023-11-18 NOTE — Telephone Encounter (Signed)
Returned her call. She is asking if she is supposed to have CT on 1/3. Told her Dr. Bertis Ruddy entered order for CT and she will call to schedule on 1/3. Given radiology scheduling #.

## 2023-12-03 ENCOUNTER — Inpatient Hospital Stay: Payer: Medicare Other | Attending: Gynecologic Oncology

## 2023-12-03 ENCOUNTER — Ambulatory Visit (HOSPITAL_COMMUNITY)
Admission: RE | Admit: 2023-12-03 | Discharge: 2023-12-03 | Disposition: A | Discharge status: Home / Self Care | Payer: Medicare Other | Source: Ambulatory Visit | Attending: Hematology and Oncology | Admitting: Hematology and Oncology

## 2023-12-03 DIAGNOSIS — I10 Essential (primary) hypertension: Secondary | ICD-10-CM | POA: Insufficient documentation

## 2023-12-03 DIAGNOSIS — Z853 Personal history of malignant neoplasm of breast: Secondary | ICD-10-CM | POA: Diagnosis not present

## 2023-12-03 DIAGNOSIS — Z8541 Personal history of malignant neoplasm of cervix uteri: Secondary | ICD-10-CM | POA: Diagnosis present

## 2023-12-03 DIAGNOSIS — C541 Malignant neoplasm of endometrium: Secondary | ICD-10-CM | POA: Insufficient documentation

## 2023-12-03 DIAGNOSIS — Z08 Encounter for follow-up examination after completed treatment for malignant neoplasm: Secondary | ICD-10-CM | POA: Insufficient documentation

## 2023-12-03 LAB — CBC WITH DIFFERENTIAL/PLATELET
Abs Immature Granulocytes: 0.01 10*3/uL (ref 0.00–0.07)
Basophils Absolute: 0 10*3/uL (ref 0.0–0.1)
Basophils Relative: 1 %
Eosinophils Absolute: 0.1 10*3/uL (ref 0.0–0.5)
Eosinophils Relative: 3 %
HCT: 41.4 % (ref 36.0–46.0)
Hemoglobin: 13.8 g/dL (ref 12.0–15.0)
Immature Granulocytes: 0 %
Lymphocytes Relative: 27 %
Lymphs Abs: 1.2 10*3/uL (ref 0.7–4.0)
MCH: 30.7 pg (ref 26.0–34.0)
MCHC: 33.3 g/dL (ref 30.0–36.0)
MCV: 92 fL (ref 80.0–100.0)
Monocytes Absolute: 0.4 10*3/uL (ref 0.1–1.0)
Monocytes Relative: 9 %
Neutro Abs: 2.7 10*3/uL (ref 1.7–7.7)
Neutrophils Relative %: 60 %
Platelets: 181 10*3/uL (ref 150–400)
RBC: 4.5 MIL/uL (ref 3.87–5.11)
RDW: 12.7 % (ref 11.5–15.5)
WBC: 4.4 10*3/uL (ref 4.0–10.5)
nRBC: 0 % (ref 0.0–0.2)

## 2023-12-03 LAB — COMPREHENSIVE METABOLIC PANEL
ALT: 17 U/L (ref 0–44)
AST: 23 U/L (ref 15–41)
Albumin: 3.8 g/dL (ref 3.5–5.0)
Alkaline Phosphatase: 77 U/L (ref 38–126)
Anion gap: 3 — ABNORMAL LOW (ref 5–15)
BUN: 22 mg/dL (ref 8–23)
CO2: 32 mmol/L (ref 22–32)
Calcium: 9.2 mg/dL (ref 8.9–10.3)
Chloride: 104 mmol/L (ref 98–111)
Creatinine, Ser: 1.01 mg/dL — ABNORMAL HIGH (ref 0.44–1.00)
GFR, Estimated: 56 mL/min — ABNORMAL LOW (ref >60)
Glucose, Bld: 109 mg/dL — ABNORMAL HIGH (ref 70–99)
Potassium: 5.2 mmol/L — ABNORMAL HIGH (ref 3.5–5.1)
Sodium: 139 mmol/L (ref 135–145)
Total Bilirubin: 0.6 mg/dL (ref 0.0–1.2)
Total Protein: 6.5 g/dL (ref 6.5–8.1)

## 2023-12-03 MED ORDER — IOHEXOL 300 MG/ML  SOLN
100.0000 mL | Freq: Once | INTRAMUSCULAR | Status: AC | PRN
  Administered 2023-12-03: 100 mL via INTRAVENOUS

## 2023-12-09 ENCOUNTER — Telehealth: Payer: Self-pay

## 2023-12-09 NOTE — Telephone Encounter (Signed)
 Called and left a message asking her if she wants to keep appt as scheduled tomorrow. Ask her to call the office back.

## 2023-12-10 ENCOUNTER — Inpatient Hospital Stay: Payer: Medicare Other | Admitting: Hematology and Oncology

## 2023-12-13 ENCOUNTER — Telehealth: Payer: Self-pay | Admitting: Hematology and Oncology

## 2023-12-17 ENCOUNTER — Telehealth: Payer: Self-pay | Admitting: Hematology and Oncology

## 2023-12-17 ENCOUNTER — Encounter: Payer: Self-pay | Admitting: Hematology and Oncology

## 2023-12-17 ENCOUNTER — Inpatient Hospital Stay (HOSPITAL_BASED_OUTPATIENT_CLINIC_OR_DEPARTMENT_OTHER): Payer: Medicare Other | Admitting: Hematology and Oncology

## 2023-12-17 VITALS — BP 169/75 | HR 72 | Temp 98.9°F | Resp 18 | Ht 63.0 in | Wt 191.6 lb

## 2023-12-17 DIAGNOSIS — C541 Malignant neoplasm of endometrium: Secondary | ICD-10-CM | POA: Diagnosis not present

## 2023-12-17 DIAGNOSIS — Z08 Encounter for follow-up examination after completed treatment for malignant neoplasm: Secondary | ICD-10-CM | POA: Diagnosis not present

## 2023-12-17 DIAGNOSIS — I10 Essential (primary) hypertension: Secondary | ICD-10-CM

## 2023-12-17 DIAGNOSIS — C50412 Malignant neoplasm of upper-outer quadrant of left female breast: Secondary | ICD-10-CM

## 2023-12-17 NOTE — Assessment & Plan Note (Signed)
CT imaging show no evidence of disease We will space out her future appointment and discontinue surveillance imaging

## 2023-12-17 NOTE — Progress Notes (Signed)
[facility] OFFICE PROGRESS NOTE  Patient Care Team: Olena Leatherwood, FNP as PCP - General (Nurse Practitioner)  ASSESSMENT & PLAN:  Endometrial cancer [facility]) CT imaging show no evidence of disease We will space out her future appointment and discontinue surveillance imaging  Breast cancer of upper-outer quadrant of left female breast [facility]) She has discontinued aromatase inhibitor due to side effects and is feeling better She will continue surveillance imaging mammogram, due in March  Essential hypertension Blood pressure is intermittently elevated We discussed importance of close monitoring of blood pressure at home  No orders of the defined types were placed in this encounter.   All questions were answered. The patient knows to call the clinic with any problems, questions or concerns. The total time spent in the appointment was 30 minutes encounter with patients including review of chart and various tests results, discussions about plan of care and coordination of care plan   Artis Delay, MD 12/17/2023 12:34 PM  INTERVAL HISTORY: Please see below for problem oriented charting. she returns for surveillance follow-up and review of imaging study results She is doing well She is not symptomatic She is worried that her husband is in the hospital but he appears to be responding well to treatment She have no new concern for her health issues We discussed test results and future follow-up  REVIEW OF SYSTEMS:   Constitutional: Denies fevers, chills or abnormal weight loss Eyes: Denies blurriness of vision Ears, nose, mouth, throat, and face: Denies mucositis or sore throat Respiratory: Denies cough, dyspnea or wheezes Cardiovascular: Denies palpitation, chest discomfort or lower extremity swelling Gastrointestinal:  Denies nausea, heartburn or change in bowel habits Skin: Denies abnormal skin rashes Lymphatics: Denies new lymphadenopathy or easy  bruising Neurological:Denies numbness, tingling or new weaknesses Behavioral/Psych: Mood is stable, no new changes  All other systems were reviewed with the patient and are negative.  I have reviewed the past medical history, past surgical history, social history and family history with the patient and they are unchanged from previous note.  ALLERGIES:  is allergic to lactose, codeine, penicillins, sulfa antibiotics, and vancomycin.  MEDICATIONS:  Current Outpatient Medications  Medication Sig Dispense Refill   acetaminophen (TYLENOL) 500 MG tablet Take 1,000 mg by mouth every 6 (six) hours as needed (pain.).     celecoxib (CELEBREX) 200 MG capsule Take 200 mg by mouth daily.     Cholecalciferol (VITAMIN D3) 50 MCG (2000 UT) TABS Take 2,000 Units by mouth in the morning.     gabapentin (NEURONTIN) 100 MG capsule Take 100 mg by mouth at bedtime.     levothyroxine (SYNTHROID) 125 MCG tablet Take 125 mcg by mouth daily before breakfast.     loperamide (IMODIUM A-D) 2 MG tablet 1/2 tablet by mouth every other day 45 tablet 3   losartan (COZAAR) 100 MG tablet Take 100 mg by mouth daily.     magnesium oxide (MAG-OX) 400 MG tablet Take 400 mg by mouth at bedtime.     Multiple Vitamin (MULTIVITAMIN WITH MINERALS) TABS tablet Take 1 tablet by mouth in the morning.     Omega-3 1000 MG CAPS Take 1,000-2,000 mg by mouth See admin instructions. Take 2 capsules by mouth in the morning & take 1 capsule by mouth at night.     pantoprazole (PROTONIX) 20 MG tablet Take 20 mg by mouth daily as needed for heartburn or indigestion.     propranolol (INDERAL) 40 MG tablet Take 40 mg by mouth 3 (  three) times daily.     rosuvastatin (CRESTOR) 5 MG tablet Take 5 mg by mouth at bedtime.     Simethicone 125 MG TABS Take 250 mg by mouth 2 (two) times daily as needed (gas).     temazepam (RESTORIL) 15 MG capsule Take 15 mg by mouth at bedtime as needed for sleep.     venlafaxine XR (EFFEXOR-XR) 37.5 MG 24 hr capsule  Take 37.5 mg by mouth daily with breakfast.     No current facility-administered medications for this visit.    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 Evaluation of the pelvis is significantly limited by dense metallic streak artifact from adjacent hip arthroplasty. Within this limitation, there is expansile, masslike, heterogeneously enhancing appearance of the endometrium, measuring at least 5.2 cm in thickness. Findings are in keeping with reported diagnosis of endometrial malignancy. 2. No evidence of lymphadenopathy or metastatic disease in the chest, abdomen, or pelvis. 3. There is a 0.4 cm fissural nodule the superior segment left lower lobe, almost certainly a benign intrapulmonary lymph node. Attention on follow-up. 4. Coronary artery disease.   10/14/2021 Surgery   TRH/BSO, right SLN biopsy, left pelvic and PA LND, peritoneal nodule biopsy, mini-lap for specimen delivery  Findings: On EUA, 8cm bulbous uterus. ON intra-abdominal entry, normal upper abdominal survey. Normal omentum, small and large bowel. Uterus 8-10cm and bulbous. Normal appearing adnexa. Mapping successful on the right, no mapping on the left. Some mildly prominent lymph nodes bilaterally in the pelvis. Small, <5 mm, nodule in the cul de sac. No obvious intra-abdominal or pelvic evidence of disease.   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 SENTINEL LYMPH NODE, RIGHT EXTERNAL ILIAC, BIOPSY:  - Lymph  node, negative for carcinoma (0/1)   B. SENTINEL LYMPH NODE, RIGHT OBTURATOR AND SURROUNDING LYMPH NODES,  BIOPSY:  - Lymph nodes, negative for carcinoma (0/5)   C. UTERUS, CERVIX, BILATERAL FALLOPIAN TUBES AND OVARIES:  - Invasive high-grade serous carcinoma  - Carcinoma focally invades into the serosal surface  - Lymphovascular invasion is present  - Benign unremarkable cervix  - Benign unremarkable bilateral fallopian tubes and ovaries  - See oncology table   D. CUL DE SAC NODULE, POSTERIOR, BIOPSY:  - Endometriosis   E. LYMPH NODE, LEFT PELVIC, BIOPSY:  - Lymph nodes, negative for carcinoma (0/3)   F. LYMPH NODE, LEFT PARAORTIC, BIOPSY:  - Lymph nodes, negative for carcinoma (0/5)   ONCOLOGY TABLE:   UTERUS, CARCINOMA OR CARCINOSARCOMA: Resection   Procedure: Total hysterectomy and bilateral salpingo-oophorectomy  Histologic Type: Serous carcinoma  Histologic Grade: High-grade  Myometrial Invasion:       Depth of Myometrial Invasion (mm): 32 mm       Myometrial Thickness (mm): 32 mm       Percentage of Myometrial Invasion: 100%  Uterine Serosa Involvement: Present, focal  Cervical stromal Involvement: Not identified  Extent of involvement of other tissue/organs: Not identified  Peritoneal/Ascitic Fluid: Negative for carcinoma  Lymphovascular Invasion:  Present  Regional Lymph Nodes:       Pelvic Lymph Nodes Examined:                                   6 Sentinel                                   3 Non-sentinel                                   9 Total       Pelvic Lymph Nodes with Metastasis: 0                           Macrometastasis: (>2.0 mm): 0                           Micrometastasis: (>0.2 mm and < 2.0 mm): 0                           Isolated Tumor Cells (<0.2 mm): 0                           Laterality of Lymph Node with Tumor: Not  applicable                           Extracapsular Extension: Not applicable       Para-aortic Lymph Nodes Examined:                                     0 Sentinel                                    5 Non-sentinel                                    5 Total       Para-aortic Lymph Nodes with Metastasis: 0                           Macrometastasis: (>2.0 mm): 0                           Micrometastasis:  (>0.2 mm and < 2.0 mm): 0                           Isolated Tumor Cells (<0.2 mm): 0                           Laterality of Lymph Node with Tumor: Not  applicable                           Extracapsular Extension: Not applicable  Distant Metastasis:  Distant Site(s) Involved: Not applicable  Pathologic Stage Classification (pTNM, AJCC 8th Edition): pT3a, pN0  Ancillary Studies: MMR / MSI testing will be ordered  Representative Tumor Block: C4  Comment(s): Pancytokeratin was performed on the lymph nodes and is negative.   By immunohistochemistry, HER-2 is EQUIVOCAL (2+).  HER-2 by FISH is pending and will be reported in an addendum.   FLOURESCENCE IN-SITU HYBRIDIZATION RESULTS:   GROUP 1:  HER2 **POSITIVE**   On the tissue sample received from this individual HER2 FISH was performed by a technologist and cell imaging and analysis on the [employer].   RATIO of HER2/CEN 17 SIGNALS: 2.50  AVERAGE HER2 COPY NUMBER PER CELL: 4.74   The ratio of HER2/CEN 17 result exceeds the cutoff value of >=2.0 and a copy number of HER2 signals exceeding the cutoff range of >=4.0 signals per cell.  Arch Pathol Lab Med 1:1, 2018.    10/28/2021 Cancer Staging   Staging form: Corpus Uteri - Carcinoma and Carcinosarcoma, AJCC 8th Edition - Pathologic stage from 10/28/2021: Stage III (pT3, pN0, cM0) - Signed by Artis Delay, MD on 10/28/2021 Stage prefix: Initial diagnosis   11/07/2021 Procedure   Successful placement of a right internal jugular approach power injectable Port-A-Cath. The catheter is ready for immediate use.   11/11/2021 - 02/26/2022 Chemotherapy   Patient is on Treatment Plan : UTERINE Carboplatin AUC 6 /  Paclitaxel q21d      Genetic Testing   Negative genetic testing. No pathogenic variants identified on the [facility] Multi-Cancer+RNA panel. VUS in MET called c.3220A>G, VUS in MUTYh called c.1417G>A and VUS in POLE called c.596G>T identified. The report date is 01/20/2022.  The Multi-Cancer Panel + RNA offered by [facility] includes sequencing and/or deletion duplication testing of the following 84 genes: AIP, ALK, APC, ATM, AXIN2,BAP1,  BARD1, BLM, BMPR1A, BRCA1, BRCA2, BRIP1, CASR, CDC73, CDH1, CDK4, CDKN1B, CDKN1C, CDKN2A (p14ARF), CDKN2A (p16INK4a), CEBPA, CHEK2, CTNNA1, DICER1, DIS3L2, EGFR (c.2369C>T, p.Thr790Met variant only), EPCAM (Deletion/duplication testing only), FH, FLCN, GATA2, GPC3, GREM1 (Promoter region deletion/duplication testing only), HOXB13 (c.251G>A, p.Gly84Glu), HRAS, KIT, MAX, MEN1, MET, MITF (c.952G>A, p.Glu318Lys variant only), MLH1, MSH2, MSH3, MSH6, MUTYH, NBN, NF1, NF2, NTHL1, PALB2, PDGFRA, PHOX2B, PMS2, POLD1, POLE, POT1, PRKAR1A, PTCH1, PTEN, RAD50, RAD51C, RAD51D, RB1, RECQL4, RET, RUNX1, SDHAF2, SDHA (sequence changes only), SDHB, SDHC, SDHD, SMAD4, SMARCA4, SMARCB1, SMARCE1, STK11, SUFU, TERC, TERT, TMEM127, TP53, TSC1, TSC2, VHL, WRN and WT1.   03/30/2022 Imaging   1. Bandlike opacity in the medial left lung apex is progressive in the interval, now with nodular component measuring up to 10 mm. While likely related to evolving atelectasis or scar, consider follow-up CT chest in 3 months to reassess. 2. Interval hysterectomy. 3. No evidence for metastatic disease in the chest, abdomen, or pelvis. 4. Trace free fluid in the upper pelvis, nonspecific. Of note, inferior pelvis is essentially obscured by beam hardening artifact from bilateral hip replacement. 5. Left colonic diverticulosis without diverticulitis. 6. Moderate stool volume. Imaging features could be compatible with constipation in the appropriate clinical setting. 7. Aortic Atherosclerosis (ICD10-I70.0).    06/26/2022 Imaging   1. Increasing conspicuity and size of a previously bandlike and now nodular area of density in the LEFT upper lobe suspicious for metastatic process. PET scan and or biopsy could be helpful for further evaluation. 2. Stable small LEFT retropectoral lymph nodes. These warrant attention on follow-up imaging based on patient history. 3. Added density throughout the LEFT breast associated with surgical clips presumably postoperative and or post treatment  related. 4. Aortic atherosclerosis.   Aortic Atherosclerosis (ICD10-I70.0).   10/29/2022 Imaging   1. The previously biopsied left upper lobe nodule is not significantly changed in size. There is a new fiducial marker along the inferior aspect of this nodule and a linear tail projecting posteriorly which may reflect a mucous impacted bronchus. Consider continued CT follow-up as clinically warranted. 2. No new or enlarging pulmonary nodules. No definite evidence of metastatic disease. 3. Interval resolution of previously demonstrated asymmetric increased density in the left breast, likely postsurgical. 4. Aortic Atherosclerosis (ICD10-I70.0) and Emphysema (ICD10-J43.9).     06/10/2023 Imaging   CT CHEST ABDOMEN PELVIS W CONTRAST  Result Date: 06/09/2023 CLINICAL DATA:  Cervical cancer, assess treatment response, breast cancer * Tracking Code: BO * EXAM: CT CHEST, ABDOMEN, AND PELVIS WITH CONTRAST TECHNIQUE: Multidetector CT imaging of the chest, abdomen and pelvis was performed following the standard protocol during bolus administration of intravenous contrast. RADIATION DOSE REDUCTION: This exam was performed according to the departmental dose-optimization program which includes automated exposure control, adjustment of the mA and/or kV according to patient size and/or use of iterative reconstruction technique. CONTRAST:  OMNIPAQUE IOHEXOL 300 MG/ML  SOLN COMPARISON:  CT chest, 10/27/2022, CT chest abdomen pelvis, 03/27/2022  PET-CT, 07/08/2022 FINDINGS: CT CHEST FINDINGS Cardiovascular: Aortic atherosclerosis. Normal heart size. Left coronary artery calcifications. No pericardial effusion. Mediastinum/Nodes: No enlarged mediastinal, hilar, or axillary lymph nodes. Thyroid gland, trachea, and esophagus demonstrate no significant findings. Lungs/Pleura: Unchanged bandlike scarring and consolidation of the medial left apex containing a biopsy marking clip (series 4, image 116). Unchanged, bland appearing bandlike scarring of the left lung base (series 4, image 95). No pleural effusion or pneumothorax. Musculoskeletal: Status post left lumpectomy. No acute osseous findings. CT ABDOMEN PELVIS FINDINGS Hepatobiliary: No solid liver abnormality is seen. Contracted gallbladder. No gallstones, gallbladder wall thickening, or biliary dilatation. Pancreas: Unremarkable. No pancreatic ductal dilatation or surrounding inflammatory changes. Spleen: Normal in size without significant abnormality. Adrenals/Urinary Tract: Adrenal glands are unremarkable. Kidneys are normal, without renal calculi, solid lesion, or hydronephrosis. Evaluation of the bladder is significantly limited by dense metallic streak artifact from adjacent bilateral hip total arthroplasty. No obvious abnormality. Stomach/Bowel: Stomach is within normal limits. Appendix appears normal. No evidence of bowel wall thickening, distention, or inflammatory changes. Pancolonic diverticulosis severe in the sigmoid. Vascular/Lymphatic: Aortic atherosclerosis. No enlarged abdominal or pelvic lymph nodes. Reproductive: Status post hysterectomy. Evaluation of the low pelvis is significantly limited by dense metallic streak artifact from bilateral hip arthroplasty. Other: No abdominal wall hernia or abnormality. No ascites. Musculoskeletal: No acute osseous findings. Status post bilateral hip total arthroplasty with dense metallic streak artifact. IMPRESSION: 1. Status post hysterectomy.  Evaluation of the low pelvis is significantly limited by dense metallic streak artifact from bilateral hip arthroplasty. Within this limitation, no obvious recurrent or metastatic disease in the pelvis. 2. No evidence of lymphadenopathy or metastatic disease in the chest, abdomen, or pelvis. 3. Unchanged bandlike scarring and consolidation of the medial left apex containing a biopsy marking clip, previously biopsied with reported benign infectious pathology. 4. Pancolonic diverticulosis. 5. Coronary artery disease. Aortic Atherosclerosis (ICD10-I70.0). Electronically Signed   By: Jearld Lesch M.D.   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  There are osteophytes in all 3 compartments and varus malalignment.  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  There is varus malalignment.  XR Lumbar Spine 2-3 Views  Result Date: 05/24/2023 2 views of the lumbar spine on show a degenerative scoliosis and degenerative changes at multiple levels.  There is no acute findings otherwise.     12/03/2023 Imaging   CT ABDOMEN PELVIS W CONTRAST Result Date: 12/09/2023 CLINICAL DATA:  High risk endometrial cancer. History of breast and lung cancer. * Tracking Code: BO * EXAM: CT ABDOMEN AND PELVIS WITH CONTRAST TECHNIQUE: Multidetector CT imaging of the abdomen and pelvis was performed using the standard protocol following bolus administration of intravenous contrast. RADIATION DOSE REDUCTION: This exam was performed according to the departmental dose-optimization program which includes automated exposure control, adjustment of the mA and/or kV according to patient size and/or use of iterative reconstruction technique. CONTRAST:   OMNIPAQUE IOHEXOL 300 MG/ML  SOLN COMPARISON:  CT scan 06/08/2023 FINDINGS: Lower chest: Descending thoracic aortic atheromatous vascular calcification. Small type 1 hiatal hernia. Hepatobiliary: Unremarkable Pancreas: Unremarkable Spleen: Unremarkable Adrenals/Urinary Tract: Mild renal sinus lipomatosis. 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 Urinary bladder empty partially obscured by streak artifact from the patient's bilateral hip implants. Mild renal cortical thinning. Stomach/Bowel: Sigmoid colon diverticulosis. Prominent stool throughout the colon favors constipation. No dilated bowel. Scattered diverticula in the remainder of the colon. Vascular/Lymphatic: Atherosclerosis is present, including aortoiliac atherosclerotic disease. Reproductive: Hysterectomy. No obvious mass lesion along the vaginal cuff for elsewhere in the pelvis, the region is somewhat obscured by streak artifact from the patient's hip implants. Other: No supplemental non-categorized findings. Musculoskeletal: Mild grade 1 degenerative anterolisthesis at L4-5 and mild grade 1 degenerative retrolisthesis at L1-2. Right foraminal impingement at L5-S1 and to a lesser degree at L4-5 primarily due to degenerative disc disease and spondylosis. New irregularity in the upper coccyx suggesting a transverse fracture for example on image 82 series 6, not present on 06/08/2023. Mild presacral thickening in the vicinity of this presumed subacute fracture. It is possible although less likely that this appearance is due to motion artifact. IMPRESSION: 1. No findings of metastatic disease in the abdomen or pelvis. 2. New irregularity in the upper coccyx suggesting a transverse fracture, not present on 06/08/2023. Mild presacral thickening in the vicinity of this presumed subacute fracture. It is possible although less likely that this appearance is due to motion artifact. 3. Prominent stool throughout the colon favors constipation. 4.  Sigmoid colon diverticulosis. 5. Small type 1 hiatal hernia. 6. 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 7. Right foraminal impingement at L5-S1 and to a lesser degree at L4-5 primarily due to degenerative disc disease and spondylosis. 8. Aortic atherosclerosis. Aortic Atherosclerosis (ICD10-I70.0). Electronically Signed   By: Gaylyn Rong M.D.   On: 12/09/2023 15:32      Breast cancer of upper-outer quadrant of left female breast (HCC)  11/19/2021 Imaging   IMPRESSION: 1. Highly suspicious mass in the left breast at 1 o'clock measuring 1.9 cm.   2.  No mammographic evidence of malignancy in the right breast   11/27/2021 Procedure   Appropriate positioning of the venous shaped biopsy marking clip at the site of biopsy in the 1 o'clock location of the LEFT breast.   12/02/2021 Initial Diagnosis   Breast cancer of upper-outer quadrant of left female breast (HCC)   12/02/2021 Cancer Staging   Staging form: Breast, AJCC 8th Edition - Clinical stage from  12/02/2021: cT1c, cN0, cM0 - Signed by Artis Delay, MD on 12/02/2021 Stage prefix: Initial diagnosis    Genetic Testing   Negative genetic testing. No pathogenic variants identified on the [facility] Multi-Cancer+RNA panel. VUS in MET called c.3220A>G, VUS in MUTYh called c.1417G>A and VUS in POLE called c.596G>T identified. The report date is 01/20/2022.  The Multi-Cancer Panel + RNA offered by [facility] includes sequencing and/or deletion duplication testing of the following 84 genes: AIP, ALK, APC, ATM, AXIN2,BAP1,  BARD1, BLM, BMPR1A, BRCA1, BRCA2, BRIP1, CASR, CDC73, CDH1, CDK4, CDKN1B, CDKN1C, CDKN2A (p14ARF), CDKN2A (p16INK4a), CEBPA, CHEK2, CTNNA1, DICER1, DIS3L2, EGFR (c.2369C>T, p.Thr790Met variant only), EPCAM (Deletion/duplication testing only), FH, FLCN, GATA2, GPC3, GREM1 (Promoter region deletion/duplication testing only), HOXB13 (c.251G>A, p.Gly84Glu), HRAS, KIT, MAX, MEN1, MET, MITF (c.952G>A, p.Glu318Lys variant  only), MLH1, MSH2, MSH3, MSH6, MUTYH, NBN, NF1, NF2, NTHL1, PALB2, PDGFRA, PHOX2B, PMS2, POLD1, POLE, POT1, PRKAR1A, PTCH1, PTEN, RAD50, RAD51C, RAD51D, RB1, RECQL4, RET, RUNX1, SDHAF2, SDHA (sequence changes only), SDHB, SDHC, SDHD, SMAD4, SMARCA4, SMARCB1, SMARCE1, STK11, SUFU, TERC, TERT, TMEM127, TP53, TSC1, TSC2, VHL, WRN and WT1.   02/23/2022 Imaging   .WHO classification is NORMAL BONE MINERAL DENSITY.    03/30/2022 -  Anti-estrogen oral therapy   She is started on Arimidex   06/16/2022 Pathology Results   FINAL MICROSCOPIC DIAGNOSIS:   A. BREAST, LEFT, LUMPECTOMY:  -  Invasive mammary carcinoma (NST)/invasive ductal carcinoma (NOS) [retained membranous e-cadherin positivity], 1.7 cm in greatest dimension, Nottingham histologic score 2 out of 3.  -  Ductal carcinoma in situ (DCIS), cribriform type, nuclear grade 2 of 3.  -  Biomarkers, performed on the biopsy specimen, ER positive (90% strong), PR positive (11 to 50% moderate), HER2/neu negative  -  Margins negative in conjunction with parts a B and C (closest margin anterior at 5 mm and superior at 9 mm all others greater than 10 mm).  pT1c pNX pM n/a   B. BREAST, LEFT POSTERIOR MARGIN, EXCISION:  -  Benign breast tissue, negative for malignancy (new margin 1.5 cm)   C. BREAST, LEFT MEDIAL MARGIN, EXCISION:  -  Benign breast tissue, negative for malignancy (new margin 1.5 cm)   ONCOLOGY TABLE:   INVASIVE CARCINOMA OF THE BREAST:  Resection   Procedure: Lumpectomy  Specimen Laterality: Left  Histologic Type: Invasive mammary carcinoma of no special type  (NST)/invasive ductal carcinoma (NOS)  Histologic Grade:       Glandular (Acinar)/Tubular Differentiation: 3       Nuclear Pleomorphism: 2       Mitotic Rate: 1       Overall Grade: 2  Tumor Size: 1.7 x 1.5 x 1.0 cm  Ductal Carcinoma In Situ: Cribriform type, nuclear grade 2 of 3  Tumor Extent: Limited to breast parenchyma  Treatment Effect in the Breast: It is noted the  patient has received  chemotherapy for endometrial carcinoma as well as antrum aromatase  inhibitor without morphologic evidence of tumor response  Margins: All margins negative for invasive carcinoma       Distance from Closest Margin (mm): 5 mm and 9 mm       Specify Closest Margin (required only if <44mm): Anterior (5) and  Superior (9)  DCIS Margins: Uninvolved by DCIS       Distance from Closest Margin (mm): 5 mm       Specify Closest Margin (required only if <57mm): Posterior  Regional Lymph Nodes: Not applicable (no lymph nodes submitted or found)  Number of Lymph Nodes Examined: 0       Number of Sentinel Nodes Examined: 0       Number of Lymph Nodes with Macrometastases (>2 mm): N/A       Number of Lymph Nodes with Micrometastases: N/A       Number of Lymph Nodes with Isolated Tumor Cells (=0.2 mm or =200  cells): N/A       Size of Largest Metastatic Deposit (mm): N/A       Extranodal Extension: N/A  Distant Metastasis:       Distant Site(s) Involved: N/A  Breast Biomarker Testing Performed on Previous Biopsy:       Testing Performed on Case Number: [id number]             Estrogen Receptor: Positive, >90% strong             Progesterone Receptor: Positive, 11 to 50% moderate             HER2: IHC 0/3; negative             Ki-67: Not performed  Pathologic Stage Classification (pTNM, AJCC 8th Edition): pT1c, pNx  Representative Tumor Block: A5  Comment(s): [None]    06/16/2022 Surgery   Preoperative diagnosis: Left breast cancer, clinical stage I  Procedure: Left breast radioactive seed guided lumpectomy Surgeon: Dr. Harden Mo   07/09/2022 PET scan   1. Hypermetabolic apical left upper lobe nodule, worrisome for primary bronchogenic carcinoma. Metastatic disease is also possibility. 2. Mild residual hypermetabolism associated with the left breast, likely postoperative/post treatment in etiology. Difficult to exclude residual disease. 3. Punctate left renal  stone.  4. Aortic atherosclerosis (ICD10-I70.0). coronary artery calcification.   07/20/2022 Pathology Results   A. LUNG, LEFT UPPER LOBE, NEEDLE CORE BIOPSY:  - Necrotizing granulomatous inflammation.  See comment.   COMMENT:   AFB, PAS-F, and GMS special stains are negative for organisms.    02/25/2023 Mammogram   MM DIAG BREAST TOMO BILATERAL  Result Date: 02/25/2023 CLINICAL DATA:  Patient is status post lumpectomy of the LEFT breast in July 2023 for [facility]. Patient could not tolerate Arimidex. Patient deferred sentinel node sampling as well as radiation. Patient with a history endometrial cancer in 2022 as well; she underwent chemotherapy for this with slight decrease size of the breast cancer prior to surgical excision. EXAM: DIGITAL DIAGNOSTIC BILATERAL MAMMOGRAM WITH TOMOSYNTHESIS TECHNIQUE: Bilateral digital diagnostic mammography and breast tomosynthesis was performed. COMPARISON:  Previous exam(s). ACR Breast Density Category c: The breasts are heterogeneously dense, which may obscure small masses. FINDINGS: There is density and architectural distortion within the LEFT breast, consistent with postsurgical changes. These are new in comparison to prior. A questioned asymmetry in the RIGHT breast resolved with additional views, consistent with overlapping tissue. No suspicious mass, distortion, or microcalcifications are identified to suggest presence of malignancy. IMPRESSION: No mammographic evidence of malignancy bilaterally. RECOMMENDATION: Recommend bilateral diagnostic mammogram (with RIGHT and LEFT breast ultrasound if deemed necessary) in 1 year. I have discussed the findings and recommendations with the patient. If applicable, a reminder letter will be sent to the patient regarding the next appointment. BI-RADS CATEGORY  2: Benign. Electronically Signed   By: Meda Klinefelter M.D.   On: 02/25/2023 14:01       PHYSICAL EXAMINATION: ECOG PERFORMANCE STATUS: 0 - Asymptomatic  Vitals:    12/17/23 1120  BP: (!) 169/75  Pulse: 72  Resp: 18  Temp: 98.9 F (37.2 C)  SpO2: 95%   Filed  Weights   12/17/23 1120  Weight: 191 lb 9.6 oz (86.9 kg)    GENERAL:alert, no distress and comfortable NEURO: alert & oriented x 3 with fluent speech, no focal motor/sensory deficits  LABORATORY DATA:  I have reviewed the data as listed    Component Value Date/Time   NA 139 12/03/2023 1055   K 5.2 (H) 12/03/2023 1055   CL 104 12/03/2023 1055   CO2 32 12/03/2023 1055   GLUCOSE 109 (H) 12/03/2023 1055   BUN 22 12/03/2023 1055   CREATININE 1.01 (H) 12/03/2023 1055   CREATININE 0.93 02/09/2023 1353   CALCIUM 9.2 12/03/2023 1055   PROT 6.5 12/03/2023 1055   ALBUMIN 3.8 12/03/2023 1055   AST 23 12/03/2023 1055   AST 24 02/09/2023 1353   ALT 17 12/03/2023 1055   ALT 16 02/09/2023 1353   ALKPHOS 77 12/03/2023 1055   BILITOT 0.6 12/03/2023 1055   BILITOT 0.4 02/09/2023 1353   GFRNONAA 56 (L) 12/03/2023 1055   GFRNONAA >60 02/09/2023 1353    No results found for: "SPEP", "UPEP"  Lab Results  Component Value Date   WBC 4.4 12/03/2023   NEUTROABS 2.7 12/03/2023   HGB 13.8 12/03/2023   HCT 41.4 12/03/2023   MCV 92.0 12/03/2023   PLT 181 12/03/2023      Chemistry      Component Value Date/Time   NA 139 12/03/2023 1055   K 5.2 (H) 12/03/2023 1055   CL 104 12/03/2023 1055   CO2 32 12/03/2023 1055   BUN 22 12/03/2023 1055   CREATININE 1.01 (H) 12/03/2023 1055   CREATININE 0.93 02/09/2023 1353      Component Value Date/Time   CALCIUM 9.2 12/03/2023 1055   ALKPHOS 77 12/03/2023 1055   AST 23 12/03/2023 1055   AST 24 02/09/2023 1353   ALT 17 12/03/2023 1055   ALT 16 02/09/2023 1353   BILITOT 0.6 12/03/2023 1055   BILITOT 0.4 02/09/2023 1353       RADIOGRAPHIC STUDIES: I have personally reviewed the radiological images as listed and agreed with the findings in the report. CT ABDOMEN PELVIS W CONTRAST Result Date: 12/09/2023 CLINICAL DATA:  High risk endometrial  cancer. History of breast and lung cancer. * Tracking Code: BO * EXAM: CT ABDOMEN AND PELVIS WITH CONTRAST TECHNIQUE: Multidetector CT imaging of the abdomen and pelvis was performed using the standard protocol following bolus administration of intravenous contrast. RADIATION DOSE REDUCTION: This exam was performed according to the departmental dose-optimization program which includes automated exposure control, adjustment of the mA and/or kV according to patient size and/or use of iterative reconstruction technique. CONTRAST:  OMNIPAQUE IOHEXOL 300 MG/ML  SOLN COMPARISON:  CT scan 06/08/2023 FINDINGS: Lower chest: Descending thoracic aortic atheromatous vascular calcification. Small type 1 hiatal hernia. Hepatobiliary: Unremarkable Pancreas: Unremarkable Spleen: Unremarkable Adrenals/Urinary Tract: Mild renal sinus lipomatosis. 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 Urinary bladder empty partially obscured by streak artifact from the patient's bilateral hip implants. Mild renal cortical thinning. Stomach/Bowel: Sigmoid colon diverticulosis. Prominent stool throughout the colon favors constipation. No dilated bowel. Scattered diverticula in the remainder of the colon. Vascular/Lymphatic: Atherosclerosis is present, including aortoiliac atherosclerotic disease. Reproductive: Hysterectomy. No obvious mass lesion along the vaginal cuff for elsewhere in the pelvis, the region is somewhat obscured by streak artifact from the patient's hip implants. Other: No supplemental non-categorized findings. Musculoskeletal: Mild grade 1 degenerative anterolisthesis at L4-5 and mild grade 1 degenerative retrolisthesis at L1-2. Right foraminal impingement at L5-S1  and to a lesser degree at L4-5 primarily due to degenerative disc disease and spondylosis. New irregularity in the upper coccyx suggesting a transverse fracture for example on image 82 series 6, not present on 06/08/2023. Mild presacral  thickening in the vicinity of this presumed subacute fracture. It is possible although less likely that this appearance is due to motion artifact. IMPRESSION: 1. No findings of metastatic disease in the abdomen or pelvis. 2. New irregularity in the upper coccyx suggesting a transverse fracture, not present on 06/08/2023. Mild presacral thickening in the vicinity of this presumed subacute fracture. It is possible although less likely that this appearance is due to motion artifact. 3. Prominent stool throughout the colon favors constipation. 4. Sigmoid colon diverticulosis. 5. Small type 1 hiatal hernia. 6. 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 7. Right foraminal impingement at L5-S1 and to a lesser degree at L4-5 primarily due to degenerative disc disease and spondylosis. 8. Aortic atherosclerosis. Aortic Atherosclerosis (ICD10-I70.0). Electronically Signed   By: Gaylyn Rong M.D.   On: 12/09/2023 15:32

## 2023-12-17 NOTE — Assessment & Plan Note (Signed)
Blood pressure is intermittently elevated We discussed importance of close monitoring of blood pressure at home

## 2023-12-17 NOTE — Telephone Encounter (Signed)
Spoke with patient confirming upcoming appointment  

## 2023-12-17 NOTE — Assessment & Plan Note (Signed)
She has discontinued aromatase inhibitor due to side effects and is feeling better She will continue surveillance imaging mammogram, due in March

## 2023-12-20 ENCOUNTER — Telehealth: Payer: Self-pay

## 2023-12-20 NOTE — Telephone Encounter (Signed)
Patient left message wanting to proceed with TKA.  Does she need OV?

## 2023-12-22 ENCOUNTER — Encounter: Payer: Self-pay | Admitting: Emergency Medicine

## 2023-12-22 ENCOUNTER — Other Ambulatory Visit: Payer: Self-pay

## 2023-12-22 ENCOUNTER — Emergency Department: Payer: Medicare Other

## 2023-12-22 DIAGNOSIS — Z853 Personal history of malignant neoplasm of breast: Secondary | ICD-10-CM | POA: Diagnosis not present

## 2023-12-22 DIAGNOSIS — I10 Essential (primary) hypertension: Secondary | ICD-10-CM | POA: Diagnosis not present

## 2023-12-22 DIAGNOSIS — E039 Hypothyroidism, unspecified: Secondary | ICD-10-CM | POA: Insufficient documentation

## 2023-12-22 DIAGNOSIS — Z96643 Presence of artificial hip joint, bilateral: Secondary | ICD-10-CM | POA: Insufficient documentation

## 2023-12-22 DIAGNOSIS — Z79899 Other long term (current) drug therapy: Secondary | ICD-10-CM | POA: Insufficient documentation

## 2023-12-22 DIAGNOSIS — Z85828 Personal history of other malignant neoplasm of skin: Secondary | ICD-10-CM | POA: Diagnosis not present

## 2023-12-22 DIAGNOSIS — Z8542 Personal history of malignant neoplasm of other parts of uterus: Secondary | ICD-10-CM | POA: Diagnosis not present

## 2023-12-22 DIAGNOSIS — S0101XA Laceration without foreign body of scalp, initial encounter: Secondary | ICD-10-CM | POA: Diagnosis not present

## 2023-12-22 DIAGNOSIS — S0990XA Unspecified injury of head, initial encounter: Secondary | ICD-10-CM | POA: Diagnosis present

## 2023-12-22 DIAGNOSIS — W01198A Fall on same level from slipping, tripping and stumbling with subsequent striking against other object, initial encounter: Secondary | ICD-10-CM | POA: Diagnosis not present

## 2023-12-22 DIAGNOSIS — Z23 Encounter for immunization: Secondary | ICD-10-CM | POA: Insufficient documentation

## 2023-12-22 NOTE — ED Triage Notes (Signed)
Patient ambulatory to triage with complaints of a fall tonight that caused head laceration to top right of scalp. Bleeding controlled in triage, no thinners. Patient states she tripped on a rug while she was in a hurry and hit her head on desk chair. Denies LOC.

## 2023-12-23 ENCOUNTER — Emergency Department
Admission: EM | Admit: 2023-12-23 | Discharge: 2023-12-23 | Disposition: A | Discharge status: Home / Self Care | Payer: Medicare Other | Attending: Emergency Medicine | Admitting: Emergency Medicine

## 2023-12-23 DIAGNOSIS — W19XXXA Unspecified fall, initial encounter: Secondary | ICD-10-CM

## 2023-12-23 DIAGNOSIS — S0101XA Laceration without foreign body of scalp, initial encounter: Secondary | ICD-10-CM

## 2023-12-23 DIAGNOSIS — S0990XA Unspecified injury of head, initial encounter: Secondary | ICD-10-CM

## 2023-12-23 MED ORDER — LIDOCAINE-EPINEPHRINE-TETRACAINE (LET) TOPICAL GEL
3.0000 mL | Freq: Once | TOPICAL | Status: AC
  Administered 2023-12-23: 3 mL via TOPICAL
  Filled 2023-12-23: qty 3

## 2023-12-23 MED ORDER — LIDOCAINE HCL (PF) 1 % IJ SOLN
5.0000 mL | Freq: Once | INTRAMUSCULAR | Status: AC
  Administered 2023-12-23: 5 mL via INTRADERMAL
  Filled 2023-12-23: qty 5

## 2023-12-23 MED ORDER — TETANUS-DIPHTH-ACELL PERTUSSIS 5-2.5-18.5 LF-MCG/0.5 IM SUSY
0.5000 mL | PREFILLED_SYRINGE | Freq: Once | INTRAMUSCULAR | Status: AC
  Administered 2023-12-23: 0.5 mL via INTRAMUSCULAR
  Filled 2023-12-23: qty 0.5

## 2023-12-23 NOTE — Telephone Encounter (Signed)
Spoke with patient today and scheduled surgery. 

## 2023-12-23 NOTE — Discharge Instructions (Addendum)
CT head and cervical spine showed no acute traumatic injury other than a scalp laceration.  You may wash this normally.  Please have your staples removed in 7 to 10 days.  This can be done by your primary care doctor, urgent care or in the ED.  You may take over-the-counter Tylenol as needed for pain.

## 2023-12-23 NOTE — ED Provider Notes (Signed)
[facility] Provider Note    Event Date/Time   First MD Initiated Contact with Patient 12/23/23 0122     (approximate)   History   Fall and Head Laceration   HPI  Kristin Carlson is a 83 y.o. female with history of hypertension, hyperlipidemia, hypothyroidism, breast cancer, endometrial cancer who presents to the emergency department after she had a mechanical fall.  States that she tripped and fell and struck her head.  She is not on any blood thinners.  No loss of consciousness.  Has a laceration to the right side of her scalp.  Denies neck or back pain, chest or abdominal pain, extremity injury.  States her last tetanus vaccine was over 5 years ago.   History provided by patient, family member.    Past Medical History:  Diagnosis Date   Actinic keratosis 06/04/2021   right anteromedial thigh   Anemia    Arthritis    Basal cell carcinoma 06/04/2021   right suprapubic, EDC 07/29/2021   Basal cell carcinoma 07/29/2021   left nasal ala. atypical basaloid cells   Basal cell carcinoma 03/04/2023   Right lateral cheek. Nodular. Referral to radiation oncology.   BCC (basal cell carcinoma of skin) 12/03/2022   superficial right superior shoulder, 03/04/23  ED&C   Family history of lung cancer    GERD (gastroesophageal reflux disease)    History of basal cell carcinoma 07/29/2021   left thigh, EDC at time of bx   History of radiation therapy    vaginal brachytherapy [facility] 12/18/2021-01/13/2022 Dr Antony Blackbird   HLD (hyperlipidemia)    Hypertension    Hypothyroidism    Pneumonia    HX OF YEARS AGO   PONV (postoperative nausea and vomiting)     Past Surgical History:  Procedure Laterality Date   BREAST BIOPSY Left 11/27/2021   u/s bs, venus clip,  INVASIVE MAMMARY CARCINOMA   BREAST CYST ASPIRATION     BREAST LUMPECTOMY Left 06/16/2022   BREAST LUMPECTOMY WITH RADIOACTIVE SEED LOCALIZATION Left 06/16/2022   Procedure: LEFT BREAST LUMPECTOMY WITH  RADIOACTIVE SEED LOCALIZATION;  Surgeon: Emelia Loron, MD;  Location: [facility];  Service: General;  Laterality: Left;   BRONCHIAL BIOPSY  07/10/2022   Procedure: BRONCHIAL BIOPSIES;  Surgeon: Omar Person, MD;  Location: [facility] ENDOSCOPY;  Service: Pulmonary;;   BRONCHIAL BRUSHINGS  07/10/2022   Procedure: BRONCHIAL BRUSHINGS;  Surgeon: Omar Person, MD;  Location: [facility] ENDOSCOPY;  Service: Pulmonary;;   BRONCHIAL NEEDLE ASPIRATION BIOPSY  07/10/2022   Procedure: BRONCHIAL NEEDLE ASPIRATION BIOPSIES;  Surgeon: Omar Person, MD;  Location: [facility] ENDOSCOPY;  Service: Pulmonary;;   BRONCHIAL WASHINGS  07/10/2022   Procedure: BRONCHIAL WASHINGS;  Surgeon: Omar Person, MD;  Location: [facility] ENDOSCOPY;  Service: Pulmonary;;   cataract Bilateral    FIDUCIAL MARKER PLACEMENT  07/10/2022   Procedure: FIDUCIAL MARKER PLACEMENT;  Surgeon: Omar Person, MD;  Location: [facility] ENDOSCOPY;  Service: Pulmonary;;   HIP SURGERY     IR IMAGING GUIDED PORT INSERTION  11/07/2021   IR REMOVAL TUN ACCESS W/ PORT W/O FL MOD SED  01/06/2022   KNEE ARTHROSCOPY Left 2003   NECK SURGERY  1997   Fusion C3, C4, C5   ROBOTIC ASSISTED TOTAL HYSTERECTOMY WITH BILATERAL SALPINGO OOPHERECTOMY Bilateral 10/14/2021   Procedure: XI ROBOTIC ASSISTED TOTAL HYSTERECTOMY WITH BILATERAL SALPINGO OOPHORECTOMY, LYMPH NODE DISSECTION,  LAPAROTOMY;  Surgeon: Carver Fila, MD;  Location: [facility]S;  Service: Gynecology;  Laterality:  Bilateral;   SENTINEL NODE BIOPSY N/A 10/14/2021   Procedure: SENTINEL NODE DISSECTION;  Surgeon: Carver Fila, MD;  Location: [facility];  Service: Gynecology;  Laterality: N/A;   THYROIDECTOMY  2007   TOTAL HIP ARTHROPLASTY Bilateral    Left Hip -2015, Right Hip 208   VIDEO BRONCHOSCOPY WITH RADIAL ENDOBRONCHIAL ULTRASOUND  07/10/2022   Procedure: RADIAL ENDOBRONCHIAL ULTRASOUND;  Surgeon: Omar Person, MD;  Location: [facility] ENDOSCOPY;  Service: Pulmonary;;     MEDICATIONS:  Prior to Admission medications   Medication Sig Start Date End Date Taking? Authorizing Provider  acetaminophen (TYLENOL) 500 MG tablet Take 1,000 mg by mouth every 6 (six) hours as needed (pain.).    [provider]  celecoxib (CELEBREX) 200 MG capsule Take 200 mg by mouth daily. 05/24/23   [provider]  Cholecalciferol (VITAMIN D3) 50 MCG (2000 UT) TABS Take 2,000 Units by mouth in the morning.    [provider]  gabapentin (NEURONTIN) 100 MG capsule Take 100 mg by mouth at bedtime. 07/28/23 07/27/24  [provider]  levothyroxine (SYNTHROID) 125 MCG tablet Take 125 mcg by mouth daily before breakfast. 03/12/22   [provider]  loperamide (IMODIUM A-D) 2 MG tablet 1/2 tablet by mouth every other day 06/28/23   Unk Lightning, PA  losartan (COZAAR) 100 MG tablet Take 100 mg by mouth daily. 09/27/23   [provider]  magnesium oxide (MAG-OX) 400 MG tablet Take 400 mg by mouth at bedtime.    [provider]  Multiple Vitamin (MULTIVITAMIN WITH MINERALS) TABS tablet Take 1 tablet by mouth in the morning.    [provider]  Omega-3 1000 MG CAPS Take 1,000-2,000 mg by mouth See admin instructions. Take 2 capsules by mouth in the morning & take 1 capsule by mouth at night.    [provider]  pantoprazole (PROTONIX) 20 MG tablet Take 20 mg by mouth daily as needed for heartburn or indigestion. 03/06/21   [provider]  propranolol (INDERAL) 40 MG tablet Take 40 mg by mouth 3 (three) times daily.    [provider]  rosuvastatin (CRESTOR) 5 MG tablet Take 5 mg by mouth at bedtime. 03/06/21 03/16/23  [provider]  Simethicone 125 MG TABS Take 250 mg by mouth 2 (two) times daily as needed (gas).    [provider]  temazepam (RESTORIL) 15 MG capsule Take 15 mg by mouth at bedtime as needed for sleep. 02/03/22   [provider]  venlafaxine XR  (EFFEXOR-XR) 37.5 MG 24 hr capsule Take 37.5 mg by mouth daily with breakfast. 07/28/23 07/27/24  [provider]    Physical Exam   Triage Vital Signs: ED Triage Vitals  Encounter Vitals Group     BP 12/22/23 2315 (!) 185/102     Systolic BP Percentile --      Diastolic BP Percentile --      Pulse Rate 12/22/23 2315 79     Resp 12/22/23 2315 18     Temp 12/22/23 2315 97.6 F (36.4 C)     Temp Source 12/22/23 2315 Oral     SpO2 12/22/23 2315 100 %     Weight 12/22/23 2316 190 lb (86.2 kg)     Height 12/22/23 2316 5\' 3"  (1.6 m)     Head Circumference --      Peak Flow --      Pain Score 12/22/23 2316 5     Pain Loc --  Pain Education --      Exclude from Growth Chart --     Most recent vital signs: Vitals:   12/22/23 2318 12/23/23 0212  BP: (!) 163/92 (!) 181/96  Pulse:  64  Resp:  18  Temp:    SpO2:  100%     CONSTITUTIONAL: Alert, responds appropriately to questions. Well-appearing; well-nourished; GCS 15, pleasant, appears younger than stated age HEAD: Normocephalic; 4 cm superficial scalp laceration to the right frontoparietal area EYES: Conjunctivae clear, PERRL, EOMI ENT: normal nose; no rhinorrhea; moist mucous membranes; pharynx without lesions noted; no dental injury; no septal hematoma, no epistaxis; no facial deformity or bony tenderness NECK: Supple, no midline spinal tenderness, step-off or deformity; trachea midline CARD: RRR; S1 and S2 appreciated; no murmurs, no clicks, no rubs, no gallops RESP: Normal chest excursion without splinting or tachypnea; breath sounds clear and equal bilaterally; no wheezes, no rhonchi, no rales; no hypoxia or respiratory distress CHEST:  chest wall stable, no crepitus or ecchymosis or deformity, nontender to palpation; no flail chest ABD/GI: Non-distended; soft, non-tender, no rebound, no guarding; no ecchymosis or other lesions noted PELVIS:  stable, nontender to palpation BACK:  The back appears normal; no  midline spinal tenderness, step-off or deformity EXT: Normal ROM in all joints; no edema; normal capillary refill; no cyanosis, no bony tenderness or bony deformity of patient's extremities, no joint effusions, compartments are soft, extremities are warm and well-perfused, no ecchymosis SKIN: Normal color for age and race; warm NEURO: No facial asymmetry, normal speech, moving all extremities equally  ED Results / Procedures / Treatments   LABS: (all labs ordered are listed, but only abnormal results are displayed) Labs Reviewed - No data to display   EKG:    RADIOLOGY: My personal review and interpretation of imaging: CT head and cervical spine show no acute traumatic injury.  I have personally reviewed all radiology reports. CT HEAD WO CONTRAST ( ) Result Date: 12/22/2023 CLINICAL DATA:  Fall tripped on rug EXAM: CT HEAD WITHOUT CONTRAST CT CERVICAL SPINE WITHOUT CONTRAST TECHNIQUE: Multidetector CT imaging of the head and cervical spine was performed following the standard protocol without intravenous contrast. Multiplanar CT image reconstructions of the cervical spine were also generated. RADIATION DOSE REDUCTION: This exam was performed according to the departmental dose-optimization program which includes automated exposure control, adjustment of the mA and/or kV according to patient size and/or use of iterative reconstruction technique. COMPARISON:  None Available. FINDINGS: CT HEAD FINDINGS Brain: No acute territorial infarction, hemorrhage or intracranial mass. Moderate white matter hypodensity consistent with chronic small vessel ischemic change. Mild atrophy. Non enlarged ventricles Vascular: No hyperdense vessels.  Carotid vascular calcification Skull: Normal. Negative for fracture or focal lesion. Sinuses/Orbits: No acute finding. Other: None CT CERVICAL SPINE FINDINGS Alignment: 3 mm retrolisthesis C3 on C4. Facet alignment is maintained. Skull base and vertebrae: Craniovertebral  junction is intact. Vertebral body heights are maintained. Small osseous densities anterior and posterior to the left C1-C2 articulation appear corticated and could reflect remote injury or nonspecific calcification. No definite acute fracture is seen. Incomplete fusion of the posterior arch of C1. Soft tissues and spinal canal: No prevertebral fluid or swelling. No visible canal hematoma. Disc levels: Anterior fusion hardware C4 through C6 with interbody devices and solid bone fusion. Advanced disc space narrowing and degenerative change at C3-C4 and C6-C7. Moderate severe bilateral foraminal narrowing at C3-C4 and C6-C7. Upper chest: Left apical lung nodule with fiducial marker again visualized. This measures about 14 x  12 mm. Right apical scarring. Other: None IMPRESSION: 1. No CT evidence for acute intracranial abnormality. 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 2. Anterior fusion hardware C4 through C6. Advanced degenerative changes at C3-C4 and C6-C7. No definite acute osseous abnormality. Electronically Signed   By: Jasmine Pang M.D.   On: 12/22/2023 23:54   CT Cervical Spine Wo Contrast Result Date: 12/22/2023 CLINICAL DATA:  Fall tripped on rug EXAM: CT HEAD WITHOUT CONTRAST CT CERVICAL SPINE WITHOUT CONTRAST TECHNIQUE: Multidetector CT imaging of the head and cervical spine was performed following the standard protocol without intravenous contrast. Multiplanar CT image reconstructions of the cervical spine were also generated. RADIATION DOSE REDUCTION: This exam was performed according to the departmental dose-optimization program which includes automated exposure control, adjustment of the mA and/or kV according to patient size and/or use of iterative reconstruction technique. COMPARISON:  None Available. FINDINGS: CT HEAD FINDINGS Brain: No acute territorial infarction, hemorrhage or intracranial mass. Moderate white matter hypodensity consistent with chronic small  vessel ischemic change. Mild atrophy. Non enlarged ventricles Vascular: No hyperdense vessels.  Carotid vascular calcification Skull: Normal. Negative for fracture or focal lesion. Sinuses/Orbits: No acute finding. Other: None CT CERVICAL SPINE FINDINGS Alignment: 3 mm retrolisthesis C3 on C4. Facet alignment is maintained. Skull base and vertebrae: Craniovertebral junction is intact. Vertebral body heights are maintained. Small osseous densities anterior and posterior to the left C1-C2 articulation appear corticated and could reflect remote injury or nonspecific calcification. No definite acute fracture is seen. Incomplete fusion of the posterior arch of C1. Soft tissues and spinal canal: No prevertebral fluid or swelling. No visible canal hematoma. Disc levels: Anterior fusion hardware C4 through C6 with interbody devices and solid bone fusion. Advanced disc space narrowing and degenerative change at C3-C4 and C6-C7. Moderate severe bilateral foraminal narrowing at C3-C4 and C6-C7. Upper chest: Left apical lung nodule with fiducial marker again visualized. This measures about 14 x 12 mm. Right apical scarring. Other: None IMPRESSION: 1. No CT evidence for acute intracranial abnormality. 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 2. Anterior fusion hardware C4 through C6. Advanced degenerative changes at C3-C4 and C6-C7. No definite acute osseous abnormality. Electronically Signed   By: Jasmine Pang M.D.   On: 12/22/2023 23:54     PROCEDURES:  Critical Care performed: No     LACERATION REPAIR Performed by: Baxter Hire Sherah Lund Authorized by: Baxter Hire Xinyi Batton Consent: Verbal consent obtained. Risks and benefits: risks, benefits and alternatives were discussed Consent given by: patient Patient identity confirmed: provided demographic data Prepped and Draped in normal sterile fashion Wound explored  Laceration Location: Right scalp  Laceration Length: 4 cm  No Foreign Bodies  seen or palpated  Anesthesia: local infiltration  Local anesthetic: lidocaine 1% without epinephrine  Anesthetic total: 5 ml  Irrigation method: syringe Amount of cleaning: standard  Skin closure: Superficial  Number of sutures: 4 staples  Technique: Area anesthetized using lidocaine 1% without epinephrine. Wound irrigated copiously with sterile saline. Wound closed using 4 staples. Good wound approximation and hemostasis achieved.    Patient tolerance: Patient tolerated the procedure well with no immediate complications.  Procedures    IMPRESSION / MDM / ASSESSMENT AND PLAN / ED COURSE  I reviewed the triage vital signs and the nursing notes.  Patient here after mechanical fall with head injury.     DIFFERENTIAL DIAGNOSIS (includes but not limited to):   Scalp laceration, skull fracture, intracranial hemorrhage, cervical spine fracture,  concussion, contusion  Patient's presentation is most consistent with acute presentation with potential threat to life or bodily function.  PLAN: CT head and cervical spine obtained from triage reviewed/interpreted by myself and the radiologist and show no acute traumatic injury.  Will update tetanus vaccine, clean and repair wound.  No other sign of traumatic injury on exam.  Otherwise well-appearing.   MEDICATIONS GIVEN IN ED: Medications  lidocaine (PF) (XYLOCAINE) 1 % injection 5 mL (5 mLs Intradermal Given 12/23/23 0207)  lidocaine-EPINEPHrine-tetracaine (LET) topical gel (3 mLs Topical Given 12/23/23 0207)  Tdap (BOOSTRIX) injection 0.5 mL (0.5 mLs Intramuscular Given 12/23/23 0217)     ED COURSE: Laceration repaired without difficulty.  Discussed wound care instructions and return precautions.  I feel she is safe to be discharged with family.  Recommended Tylenol as needed for pain control at home.   At this time, I do not feel there is any life-threatening condition present. I reviewed all nursing notes, vitals, pertinent  previous records.  All lab and urine results, EKGs, imaging ordered have been independently reviewed and interpreted by myself.  I reviewed all available radiology reports from any imaging ordered this visit.  Based on my assessment, I feel the patient is safe to be discharged home without further emergent workup and can continue workup as an outpatient as needed. Discussed all findings, treatment plan as well as usual and customary return precautions.  They verbalize understanding and are comfortable with this plan.  Outpatient follow-up has been provided as needed.  All questions have been answered.    CONSULTS:  none   OUTSIDE RECORDS REVIEWED: Reviewed last oncology note in January 2025.       FINAL CLINICAL IMPRESSION(S) / ED DIAGNOSES   Final diagnoses:  Fall, initial encounter  Injury of head, initial encounter  Laceration of scalp, initial encounter     Rx / DC Orders   ED Discharge Orders     None        Note:  This document was prepared using Dragon voice recognition software and may include unintentional dictation errors.   Katty Fretwell, Layla Maw, DO 12/23/23 [phone]

## 2023-12-24 ENCOUNTER — Other Ambulatory Visit: Payer: Self-pay

## 2023-12-30 ENCOUNTER — Ambulatory Visit
Admission: EM | Admit: 2023-12-30 | Discharge: 2023-12-30 | Disposition: A | Discharge status: Home / Self Care | Payer: Medicare Other | Attending: Emergency Medicine | Admitting: Emergency Medicine

## 2023-12-30 DIAGNOSIS — Z4802 Encounter for removal of sutures: Secondary | ICD-10-CM | POA: Diagnosis not present

## 2023-12-30 NOTE — Discharge Instructions (Addendum)
Follow-up with your primary care provider as needed.

## 2023-12-30 NOTE — Progress Notes (Addendum)
 COVID Vaccine Completed: yes  Date of COVID positive in last 90 days: no  PCP - Sherwood Ran, FNP Cardiologist - n/a  Chest x-ray - 06/08/23 Epic EKG - 01/05/24 Epic/chart Stress Test - n/a ECHO - 01/06/22 Epic Cardiac Cath - n/a Pacemaker/ICD device last checked: n/a Spinal Cord Stimulator: n/a  Bowel Prep - no  Sleep Study - n/a CPAP -   Fasting Blood Sugar - n/a Checks Blood Sugar _____ times a day  Last dose of GLP1 agonist-  N/A GLP1 instructions:  Hold 7 days before surgery    Last dose of SGLT-2 inhibitors-  N/A SGLT-2 instructions:  Hold 3 days before surgery    Blood Thinner Instructions: n/a Aspirin  Instructions: Last Dose:  Activity level: Can go up a flight of stairs and perform activities of daily living without stopping and without symptoms of chest pain or shortness of breath. Slow with stairs due to knee pain   Anesthesia review:   Patient denies shortness of breath, fever, cough and chest pain at PAT appointment  Patient verbalized understanding of instructions that were given to them at the PAT appointment. Patient was also instructed that they will need to review over the PAT instructions again at home before surgery.

## 2023-12-30 NOTE — ED Triage Notes (Signed)
Pt presents for suture removal. Pt fell and hit her head on 12/22/23 and got 4 staples placed.

## 2023-12-30 NOTE — ED Notes (Signed)
Pt blood pressure elevated, pt states her blood pressure normally that high and will discuss with pcp.

## 2023-12-30 NOTE — Patient Instructions (Addendum)
 SURGICAL WAITING ROOM VISITATION  Patients having surgery or a procedure may have no more than 2 support people in the waiting area - these visitors may rotate.    Children under the age of 17 must have an adult with them who is not the patient.  Due to an increase in RSV and influenza rates and associated hospitalizations, children ages 65 and under may not visit patients in [facility] hospitals.  Visitors with respiratory illnesses are discouraged from visiting and should remain at home.  If the patient needs to stay at the hospital during part of their recovery, the visitor guidelines for inpatient rooms apply. Pre-op nurse will coordinate an appropriate time for 1 support person to accompany patient in pre-op.  This support person may not rotate.    Please refer to the [facility] website for the visitor guidelines for Inpatients (after your surgery is over and you are in a regular room).    Your procedure is scheduled on: 01/13/24   [facility]    Report to admitting at 6:30 AM   Call this number if you have problems the morning of surgery [phone]   Do not eat food :After Midnight.   After Midnight you may have the following liquids until 6:00 AM DAY OF SURGERY  Water  Non-Citrus Juices (without pulp, NO RED-Apple, White grape, White cranberry) Black Coffee (NO MILK/CREAM OR CREAMERS, sugar ok)  Clear Tea (NO MILK/CREAM OR CREAMERS, sugar ok) regular and decaf                             Plain Jell-O (NO RED)                                           Fruit ices (not with fruit pulp, NO RED)                                     Popsicles (NO RED)                                                               Sports drinks like Gatorade (NO RED)     The day of surgery:  Drink ONE (1) Pre-Surgery Clear Ensure at 6:00 AM the morning of surgery. Drink in one sitting. Do not sip.  This drink was given to you during your hospital  pre-op  appointment visit. Nothing else to drink after completing the  Pre-Surgery Clear Ensure.          If you have questions, please contact your surgeon's office.   FOLLOW BOWEL PREP AND ANY ADDITIONAL PRE OP INSTRUCTIONS YOU RECEIVED FROM YOUR SURGEON'S OFFICE!!!     Oral Hygiene is also important to reduce your risk of infection.                                    Remember - BRUSH YOUR TEETH THE MORNING OF SURGERY WITH YOUR REGULAR TOOTHPASTE  DENTURES WILL BE REMOVED PRIOR TO SURGERY PLEASE DO NOT APPLY Poly grip OR ADHESIVES!!!   Stop all vitamins and herbal supplements 7 days before surgery.   Take these medicines the morning of surgery with A SIP OF WATER : Tylenol , Levothyroxine , Pantoprazole , Propranolol , Venlafaxine               You may not have any metal on your body including hair pins, jewelry, and body piercing             Do not wear make-up, lotions, powders, perfumes, or deodorant  Do not wear nail polish including gel and S&S, artificial/acrylic nails, or any other type of covering on natural nails including finger and toenails. If you have artificial nails, gel coating, etc. that needs to be removed by a nail salon please have this removed prior to surgery or surgery may need to be canceled/ delayed if the surgeon/ anesthesia feels like they are unable to be safely monitored.   Do not shave  48 hours prior to surgery.    Do not bring valuables to the hospital. [facility] IS NOT             RESPONSIBLE   FOR VALUABLES.   Contacts, glasses, dentures or bridgework may not be worn into surgery.   Bring small overnight bag day of surgery.   DO NOT BRING YOUR HOME MEDICATIONS TO THE HOSPITAL. PHARMACY WILL DISPENSE MEDICATIONS LISTED ON YOUR MEDICATION LIST TO YOU DURING YOUR ADMISSION IN THE HOSPITAL!    Special Instructions: Bring a copy of your healthcare power of attorney and living will documents the day of surgery if you haven't scanned them before.               Please read over the following fact sheets you were given: IF YOU HAVE QUESTIONS ABOUT YOUR PRE-OP INSTRUCTIONS PLEASE CALL [phone] [place]    If you received a COVID test during your pre-op visit  it is requested that you wear a mask when out in public, stay away from anyone that may not be feeling well and notify your surgeon if you develop symptoms. If you test positive for Covid or have been in contact with anyone that has tested positive in the last 10 days please notify you surgeon.      Pre-operative 5 CHG Bath Instructions   You can play a key role in reducing the risk of infection after surgery. Your skin needs to be as free of germs as possible. You can reduce the number of germs on your skin by washing with CHG (chlorhexidine  gluconate) soap before surgery. CHG is an antiseptic soap that kills germs and continues to kill germs even after washing.   DO NOT use if you have an allergy to chlorhexidine /CHG or antibacterial soaps. If your skin becomes reddened or irritated, stop using the CHG and notify one of our RNs at [phone].   Please shower with the CHG soap starting 4 days before surgery using the following schedule:     Please keep in mind the following:  DO NOT shave, including legs and underarms, starting the day of your first shower.   You may shave your face at any point before/day of surgery.  Place clean sheets on your bed the day you start using CHG soap. Use a clean washcloth (not used since being washed) for each shower. DO NOT sleep with pets once you start using the CHG.   CHG Shower Instructions:  If you choose to  wash your hair and private area, wash first with your normal shampoo/soap.  After you use shampoo/soap, rinse your hair and body thoroughly to remove shampoo/soap residue.  Turn the water  OFF and apply about 3 tablespoons (45 ml) of CHG soap to a CLEAN washcloth.  Apply CHG soap ONLY FROM YOUR NECK DOWN TO YOUR TOES (washing for 3-5 minutes)   DO NOT use CHG soap on face, private areas, open wounds, or sores.  Pay special attention to the area where your surgery is being performed.  If you are having back surgery, having someone wash your back for you may be helpful. Wait 2 minutes after CHG soap is applied, then you may rinse off the CHG soap.  Pat dry with a clean towel  Put on clean clothes/pajamas   If you choose to wear lotion, please use ONLY the CHG-compatible lotions on the back of this paper.     Additional instructions for the day of surgery: DO NOT APPLY any lotions, deodorants, cologne, or perfumes.   Put on clean/comfortable clothes.  Brush your teeth.  Ask your nurse before applying any prescription medications to the skin.      CHG Compatible Lotions   Aveeno Moisturizing lotion  Cetaphil Moisturizing Cream  Cetaphil Moisturizing Lotion  Clairol Herbal Essence Moisturizing Lotion, Dry Skin  Clairol Herbal Essence Moisturizing Lotion, Extra Dry Skin  Clairol Herbal Essence Moisturizing Lotion, Normal Skin  Curel Age Defying Therapeutic Moisturizing Lotion with Alpha Hydroxy  Curel Extreme Care Body Lotion  Curel Soothing Hands Moisturizing Hand Lotion  Curel Therapeutic Moisturizing Cream, Fragrance-Free  Curel Therapeutic Moisturizing Lotion, Fragrance-Free  Curel Therapeutic Moisturizing Lotion, Original Formula  Eucerin Daily Replenishing Lotion  Eucerin Dry Skin Therapy Plus Alpha Hydroxy Crme  Eucerin Dry Skin Therapy Plus Alpha Hydroxy Lotion  Eucerin Original Crme  Eucerin Original Lotion  Eucerin Plus Crme Eucerin Plus Lotion  Eucerin TriLipid Replenishing Lotion  Keri Anti-Bacterial Hand Lotion  Keri Deep Conditioning Original Lotion Dry Skin Formula Softly Scented  Keri Deep Conditioning Original Lotion, Fragrance Free Sensitive Skin Formula  Keri Lotion Fast Absorbing Fragrance Free Sensitive Skin Formula  Keri Lotion Fast Absorbing Softly Scented Dry Skin Formula  Keri Original  Lotion  Keri Skin Renewal Lotion Keri Silky Smooth Lotion  Keri Silky Smooth Sensitive Skin Lotion  Nivea Body Creamy Conditioning Oil  Nivea Body Extra Enriched Lotion  Nivea Body Original Lotion  Nivea Body Sheer Moisturizing Lotion Nivea Crme  Nivea Skin Firming Lotion  NutraDerm 30 Skin Lotion  NutraDerm Skin Lotion  NutraDerm Therapeutic Skin Cream  NutraDerm Therapeutic Skin Lotion  ProShield Protective Hand Cream  Provon moisturizing lotion   Incentive Spirometer  An incentive spirometer is a tool that can help keep your lungs clear and active. This tool measures how well you are filling your lungs with each breath. Taking long deep breaths may help reverse or decrease the chance of developing breathing (pulmonary) problems (especially infection) following: A long period of time when you are unable to move or be active. BEFORE THE PROCEDURE  If the spirometer includes an indicator to show your best effort, your nurse or respiratory therapist will set it to a desired goal. If possible, sit up straight or lean slightly forward. Try not to slouch. Hold the incentive spirometer in an upright position. INSTRUCTIONS FOR USE  Sit on the edge of your bed if possible, or sit up as far as you can in bed or on a chair. Hold the incentive spirometer in  an upright position. Breathe out normally. Place the mouthpiece in your mouth and seal your lips tightly around it. Breathe in slowly and as deeply as possible, raising the piston or the ball toward the top of the column. Hold your breath for 3-5 seconds or for as long as possible. Allow the piston or ball to fall to the bottom of the column. Remove the mouthpiece from your mouth and breathe out normally. Rest for a few seconds and repeat Steps 1 through 7 at least 10 times every 1-2 hours when you are awake. Take your time and take a few normal breaths between deep breaths. The spirometer may include an indicator to show your best effort.  Use the indicator as a goal to work toward during each repetition. After each set of 10 deep breaths, practice coughing to be sure your lungs are clear. If you have an incision (the cut made at the time of surgery), support your incision when coughing by placing a pillow or rolled up towels firmly against it. Once you are able to get out of bed, walk around indoors and cough well. You may stop using the incentive spirometer when instructed by your caregiver.  RISKS AND COMPLICATIONS Take your time so you do not get dizzy or light-headed. If you are in pain, you may need to take or ask for pain medication before doing incentive spirometry. It is harder to take a deep breath if you are having pain. AFTER USE Rest and breathe slowly and easily. It can be helpful to keep track of a log of your progress. Your caregiver can provide you with a simple table to help with this. If you are using the spirometer at home, follow these instructions: SEEK MEDICAL CARE IF:  You are having difficultly using the spirometer. You have trouble using the spirometer as often as instructed. Your pain medication is not giving enough relief while using the spirometer. You develop fever of 100.5 F (38.1 C) or higher. SEEK IMMEDIATE MEDICAL CARE IF:  You cough up bloody sputum that had not been present before. You develop fever of 102 F (38.9 C) or greater. You develop worsening pain at or near the incision site. MAKE SURE YOU:  Understand these instructions. Will watch your condition. Will get help right away if you are not doing well or get worse. Document Released: 03/29/2007 Document Revised: 02/08/2012 Document Reviewed: 05/30/2007 ExitCare Patient Information 2014 Kristin Carlson.   ________________________________________________________________________ View Pre-Surgery Education Videos:  [email or web address]

## 2023-12-30 NOTE — ED Provider Notes (Signed)
Renaldo Fiddler    CSN: [id number] Arrival date & time: 12/30/23  1557      History   Chief Complaint Chief Complaint  Patient presents with   Suture / Staple Removal    HPI Kristin Carlson is a 83 y.o. female.  Patient presents for suture removal.  She fell on 12/23/2023; she was seen at [facility] ED; staples placed in scalp laceration.  She states the wound has been healing well.  No fever or drainage.  The history is provided by the patient and medical records.    Past Medical History:  Diagnosis Date   Actinic keratosis 06/04/2021   right anteromedial thigh   Anemia    Arthritis    Basal cell carcinoma 06/04/2021   right suprapubic, EDC 07/29/2021   Basal cell carcinoma 07/29/2021   left nasal ala. atypical basaloid cells   Basal cell carcinoma 03/04/2023   Right lateral cheek. Nodular. Referral to radiation oncology.   BCC (basal cell carcinoma of skin) 12/03/2022   superficial right superior shoulder, 03/04/23  ED&C   Family history of lung cancer    GERD (gastroesophageal reflux disease)    History of basal cell carcinoma 07/29/2021   left thigh, EDC at time of bx   History of radiation therapy    vaginal brachytherapy [facility] 12/18/2021-01/13/2022 Dr Carlson Blackbird   HLD (hyperlipidemia)    Hypertension    Hypothyroidism    Pneumonia    HX OF YEARS AGO   PONV (postoperative nausea and vomiting)     Patient Active Problem List   Diagnosis Date Noted   Other fatigue 06/10/2023   Hot flashes related to aromatase inhibitor therapy 06/29/2022   Pulmonary nodule seen on imaging study 05/04/2022   Lower extremity edema 04/06/2022   Genetic testing 01/20/2022   Family history of lung cancer 01/05/2022   Infected venous access port, initial encounter 01/05/2022   Pancytopenia, acquired (HCC) 01/05/2022   Other skin changes 01/01/2022   Congestion of upper airway 01/01/2022   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   Essential hypertension    Endometrial cancer (HCC) 09/22/2021   Basal cell carcinoma 06/04/2021    Past Surgical History:  Procedure Laterality Date   BREAST BIOPSY Left 11/27/2021   u/s bs, venus clip,  INVASIVE MAMMARY CARCINOMA   BREAST CYST ASPIRATION     BREAST LUMPECTOMY Left 06/16/2022   BREAST LUMPECTOMY WITH RADIOACTIVE SEED LOCALIZATION Left 06/16/2022   Procedure: LEFT BREAST LUMPECTOMY WITH RADIOACTIVE SEED LOCALIZATION;  Surgeon: Emelia Loron, MD;  Location:  SURGERY CENTER;  Service: General;  Laterality: Left;   BRONCHIAL BIOPSY  07/10/2022   Procedure: BRONCHIAL BIOPSIES;  Surgeon: Omar Person, MD;  Location: [facility] ENDOSCOPY;  Service: Pulmonary;;   BRONCHIAL BRUSHINGS  07/10/2022   Procedure: BRONCHIAL BRUSHINGS;  Surgeon: Omar Person, MD;  Location: [facility] ENDOSCOPY;  Service: Pulmonary;;   BRONCHIAL NEEDLE ASPIRATION BIOPSY  07/10/2022   Procedure: BRONCHIAL NEEDLE ASPIRATION BIOPSIES;  Surgeon: Omar Person, MD;  Location: [facility] ENDOSCOPY;  Service: Pulmonary;;   BRONCHIAL WASHINGS  07/10/2022   Procedure: BRONCHIAL WASHINGS;  Surgeon: Omar Person, MD;  Location: [facility] ENDOSCOPY;  Service: Pulmonary;;   cataract Bilateral    FIDUCIAL MARKER PLACEMENT  07/10/2022   Procedure: FIDUCIAL MARKER PLACEMENT;  Surgeon: Omar Person, MD;  Location: [facility] ENDOSCOPY;  Service: Pulmonary;;   HIP SURGERY     IR  IMAGING GUIDED PORT INSERTION  11/07/2021   IR REMOVAL TUN ACCESS W/ PORT W/O FL MOD SED  01/06/2022   KNEE ARTHROSCOPY Left 2003   NECK SURGERY  1997   Fusion C3, C4, C5   ROBOTIC ASSISTED TOTAL HYSTERECTOMY WITH BILATERAL SALPINGO OOPHERECTOMY Bilateral 10/14/2021   Procedure: XI ROBOTIC ASSISTED TOTAL HYSTERECTOMY WITH BILATERAL SALPINGO OOPHORECTOMY, LYMPH NODE DISSECTION,  LAPAROTOMY;  Surgeon: Carver Fila, MD;  Location: [facility] ORS;  Service: Gynecology;  Laterality: Bilateral;    SENTINEL NODE BIOPSY N/A 10/14/2021   Procedure: SENTINEL NODE DISSECTION;  Surgeon: Carver Fila, MD;  Location: [facility] ORS;  Service: Gynecology;  Laterality: N/A;   THYROIDECTOMY  2007   TOTAL HIP ARTHROPLASTY Bilateral    Left Hip -2015, Right Hip 208   VIDEO BRONCHOSCOPY WITH RADIAL ENDOBRONCHIAL ULTRASOUND  07/10/2022   Procedure: RADIAL ENDOBRONCHIAL ULTRASOUND;  Surgeon: Omar Person, MD;  Location: [facility] ENDOSCOPY;  Service: Pulmonary;;    OB History     Gravida  3   Para      Term      Preterm      AB      Living         SAB      IAB      Ectopic      Multiple      Live Births               Home Medications    Prior to Admission medications   Medication Sig Start Date End Date Taking? Authorizing Provider  acetaminophen (TYLENOL) 500 MG tablet Take 1,000 mg by mouth every 6 (six) hours as needed (pain.).    [provider]  celecoxib (CELEBREX) 200 MG capsule Take 200 mg by mouth daily. 05/24/23   [provider]  Cholecalciferol (VITAMIN D3) 50 MCG (2000 UT) TABS Take 2,000 Units by mouth in the morning.    [provider]  gabapentin (NEURONTIN) 100 MG capsule Take 100 mg by mouth at bedtime. 07/28/23 07/27/24  [provider]  levothyroxine (SYNTHROID) 125 MCG tablet Take 125 mcg by mouth daily before breakfast. 03/12/22   [provider]  loperamide (IMODIUM A-D) 2 MG tablet 1/2 tablet by mouth every other day 06/28/23   Unk Lightning, PA  losartan (COZAAR) 100 MG tablet Take 100 mg by mouth daily. 09/27/23   [provider]  magnesium oxide (MAG-OX) 400 MG tablet Take 400 mg by mouth at bedtime.    [provider]  Multiple Vitamin (MULTIVITAMIN WITH MINERALS) TABS tablet Take 1 tablet by mouth in the morning.    [provider]  Omega-3 1000 MG CAPS Take 1,000-2,000 mg by mouth See admin instructions. Take 2 capsules by mouth in the morning & take 1 capsule by  mouth at night.    [provider]  pantoprazole (PROTONIX) 20 MG tablet Take 20 mg by mouth daily as needed for heartburn or indigestion. 03/06/21   [provider]  propranolol (INDERAL) 40 MG tablet Take 40 mg by mouth 3 (three) times daily.    [provider]  rosuvastatin (CRESTOR) 5 MG tablet Take 5 mg by mouth at bedtime. 03/06/21 03/16/23  [provider]  Simethicone 125 MG TABS Take 250 mg by mouth 2 (two) times daily as needed (gas).    [provider]  temazepam (RESTORIL) 15 MG capsule Take 15 mg by mouth at bedtime as needed for sleep. 02/03/22   [provider]  venlafaxine XR (EFFEXOR-XR) 37.5 MG 24 hr capsule Take 37.5 mg by mouth daily with breakfast. 07/28/23 07/27/24  [provider]    Family History Family History  Problem Relation Age of Onset   Cancer Father        Lung Cancer   Lung cancer Father        smoked   Skin cancer Maternal Grandmother    Skin cancer Paternal Grandmother    Cancer Paternal Aunt        mouth   Colon cancer Neg Hx    Breast cancer Neg Hx    Ovarian cancer Neg Hx    Endometrial cancer Neg Hx    Pancreatic cancer Neg Hx    Prostate cancer Neg Hx     Social History Social History   Tobacco Use   Smoking status: Former    Current packs/day: 0.00    Average packs/day: 0.5 packs/day for 3.0 years (1.5 ttl pk-yrs)    Types: Cigarettes    Start date: 12/01/1963    Quit date: 11/30/1966    Years since quitting: 57.1   Smokeless tobacco: Never  Vaping Use   Vaping status: Never Used  Substance Use Topics   Alcohol use: Yes    Comment: RARE   Drug use: Never     Allergies   Lactose, Codeine, Penicillins, Sulfa antibiotics, and Vancomycin   Review of Systems Review of Systems  Constitutional:  Negative for chills and fever.  Skin:  Positive for wound. Negative for color change.     Physical Exam Triage Vital Signs ED Triage Vitals  Encounter Vitals Group     BP       Systolic BP Percentile      Diastolic BP Percentile      Pulse      Resp      Temp      Temp src      SpO2      Weight      Height      Head Circumference      Peak Flow      Pain Score      Pain Loc      Pain Education      Exclude from Growth Chart    No data found.  Updated Vital Signs BP (!) 186/84 (BP Location: Right Arm)   Pulse 76   Temp 97.7 F (36.5 C)   Resp 18   SpO2 97%   Visual Acuity Right Eye Distance:   Left Eye Distance:   Bilateral Distance:    Right Eye Near:   Left Eye Near:    Bilateral Near:     Physical Exam Constitutional:      General: She is not in acute distress. HENT:     Mouth/Throat:     Mouth: Mucous membranes are moist.  Cardiovascular:     Rate and Rhythm: Normal rate and regular rhythm.  Pulmonary:     Effort: Pulmonary effort is normal. No respiratory distress.  Skin:    General: Skin is warm and dry.     Findings: Lesion present. No erythema.     Comments: Healing laceration on scalp.  No erythema or drainage.    Neurological:     Mental Status: She is alert.      UC Treatments / Results  Labs (all labs ordered are listed, but only abnormal results are displayed) Labs Reviewed - No data to display  EKG   Radiology No results  found.  Procedures Procedures (including critical care time)  Medications Ordered in UC Medications - No data to display  Initial Impression / Assessment and Plan / UC Course  I have reviewed the triage vital signs and the nursing notes.  Pertinent labs & imaging results that were available during my care of the patient were reviewed by me and considered in my medical decision making (see chart for details).    Encounter for removal of staples.  The wound appears to be healing well.  No erythema or drainage.  4 staples removed by RN.  Education provided on wound care after suture removal.  Instructed patient to follow-up with her PCP as needed.  She agrees to plan of care.  Final  Clinical Impressions(s) / UC Diagnoses   Final diagnoses:  Encounter for removal of staples     Discharge Instructions      Follow-up with your primary care provider as needed.      ED Prescriptions   None    PDMP not reviewed this encounter.   Mickie Bail, NP 12/30/23 [phone]

## 2023-12-31 ENCOUNTER — Telehealth: Payer: Self-pay | Admitting: Orthopaedic Surgery

## 2023-12-31 NOTE — Telephone Encounter (Signed)
Patient called advised she fell 12/15/2023 and hurt her right knee. Patient said he knee is bruised and sore.  The number to contact Gearldine Bienenstock is [phone]

## 2023-12-31 NOTE — Telephone Encounter (Signed)
Called patient. She will see how her knee feels over the weekend & let us know. Her pre-op appointment is on Feb. 5th

## 2024-01-05 ENCOUNTER — Other Ambulatory Visit: Payer: Self-pay

## 2024-01-05 ENCOUNTER — Encounter (HOSPITAL_COMMUNITY)
Admission: RE | Admit: 2024-01-05 | Discharge: 2024-01-05 | Disposition: A | Discharge status: Home / Self Care | Payer: Medicare Other | Source: Ambulatory Visit | Attending: Orthopaedic Surgery | Admitting: Orthopaedic Surgery

## 2024-01-05 ENCOUNTER — Encounter (HOSPITAL_COMMUNITY): Payer: Self-pay

## 2024-01-05 VITALS — BP 161/93 | HR 62 | Temp 97.7°F | Resp 16 | Ht 63.0 in | Wt 190.0 lb

## 2024-01-05 DIAGNOSIS — I10 Essential (primary) hypertension: Secondary | ICD-10-CM | POA: Diagnosis not present

## 2024-01-05 DIAGNOSIS — Z01818 Encounter for other preprocedural examination: Secondary | ICD-10-CM | POA: Diagnosis present

## 2024-01-05 DIAGNOSIS — M1712 Unilateral primary osteoarthritis, left knee: Secondary | ICD-10-CM | POA: Diagnosis not present

## 2024-01-05 HISTORY — DX: Lymphedema, not elsewhere classified: I89.0

## 2024-01-05 HISTORY — DX: Personal history of urinary calculi: Z87.442

## 2024-01-05 LAB — CBC
HCT: 40.5 % (ref 36.0–46.0)
Hemoglobin: 12.9 g/dL (ref 12.0–15.0)
MCH: 30.4 pg (ref 26.0–34.0)
MCHC: 31.9 g/dL (ref 30.0–36.0)
MCV: 95.3 fL (ref 80.0–100.0)
Platelets: 194 10*3/uL (ref 150–400)
RBC: 4.25 MIL/uL (ref 3.87–5.11)
RDW: 12.9 % (ref 11.5–15.5)
WBC: 5.5 10*3/uL (ref 4.0–10.5)
nRBC: 0 % (ref 0.0–0.2)

## 2024-01-05 LAB — BASIC METABOLIC PANEL
Anion gap: 7 (ref 5–15)
BUN: 20 mg/dL (ref 8–23)
CO2: 28 mmol/L (ref 22–32)
Calcium: 8.5 mg/dL — ABNORMAL LOW (ref 8.9–10.3)
Chloride: 100 mmol/L (ref 98–111)
Creatinine, Ser: 0.92 mg/dL (ref 0.44–1.00)
GFR, Estimated: >60 mL/min (ref >60)
Glucose, Bld: 98 mg/dL (ref 70–99)
Potassium: 3.7 mmol/L (ref 3.5–5.1)
Sodium: 135 mmol/L (ref 135–145)

## 2024-01-05 LAB — SURGICAL PCR SCREEN
MRSA, PCR: NEGATIVE
Staphylococcus aureus: NEGATIVE

## 2024-01-07 ENCOUNTER — Encounter (HOSPITAL_COMMUNITY): Payer: Self-pay

## 2024-01-12 DIAGNOSIS — M1712 Unilateral primary osteoarthritis, left knee: Secondary | ICD-10-CM | POA: Insufficient documentation

## 2024-01-12 NOTE — Anesthesia Preprocedure Evaluation (Signed)
Anesthesia Evaluation  Patient identified by MRN, date of birth, ID band Patient awake    Reviewed: Allergy & Precautions, NPO status , Patient's Chart, lab work & pertinent test results, reviewed documented beta blocker date and time   History of Anesthesia Complications (+) PONV and history of anesthetic complications  Airway Mallampati: II  TM Distance: >3 FB Neck ROM: Full    Dental no notable dental hx.    Pulmonary former smoker   Pulmonary exam normal        Cardiovascular hypertension, Pt. on medications and Pt. on home beta blockers Normal cardiovascular exam     Neuro/Psych negative neurological ROS     GI/Hepatic Neg liver ROS,GERD  Medicated,,  Endo/Other  Hypothyroidism    Renal/GU negative Renal ROS     Musculoskeletal  (+) Arthritis ,    Abdominal   Peds  Hematology negative hematology ROS (+)   Anesthesia Other Findings Day of surgery medications reviewed with patient.  Reproductive/Obstetrics                              Anesthesia Physical Anesthesia Plan  ASA: 2  Anesthesia Plan: Spinal   Post-op Pain Management: Tylenol PO (pre-op)* and Regional block*   Induction:   PONV Risk Score and Plan: 3 and Treatment may vary due to age or medical condition, Ondansetron, Propofol infusion and Dexamethasone  Airway Management Planned: Natural Airway and Simple Face Mask  Additional Equipment: None  Intra-op Plan:   Post-operative Plan:   Informed Consent: I have reviewed the patients History and Physical, chart, labs and discussed the procedure including the risks, benefits and alternatives for the proposed anesthesia with the patient or authorized representative who has indicated his/her understanding and acceptance.       Plan Discussed with: CRNA  Anesthesia Plan Comments:         Anesthesia Quick Evaluation

## 2024-01-12 NOTE — H&P (Signed)
TOTAL KNEE ADMISSION H&P  Patient is being admitted for left total knee arthroplasty.  Subjective:  Chief Complaint:left knee pain.  HPI: Kristin Carlson, 83 y.o. female, has a history of pain and functional disability in the left knee due to arthritis and has failed non-surgical conservative treatments for greater than 12 weeks to includeNSAID's and/or analgesics, corticosteriod injections, viscosupplementation injections, flexibility and strengthening excercises, use of assistive devices, and activity modification.  Onset of symptoms was gradual, starting >10 years ago with gradually worsening course since that time. The patient noted prior procedures on the knee to include  arthroscopy on the left knee(s).  Patient currently rates pain in the left knee(s) at 10 out of 10 with activity. Patient has night pain, worsening of pain with activity and weight bearing, pain that interferes with activities of daily living, pain with passive range of motion, crepitus, and joint swelling.  Patient has evidence of subchondral sclerosis, periarticular osteophytes, and joint space narrowing by imaging studies. There is no active infection.  Patient Active Problem List   Diagnosis Date Noted   Unilateral primary osteoarthritis, left knee 01/12/2024   Other fatigue 06/10/2023   Hot flashes related to aromatase inhibitor therapy 06/29/2022   Pulmonary nodule seen on imaging study 05/04/2022   Lower extremity edema 04/06/2022   Genetic testing 01/20/2022   Family history of lung cancer 01/05/2022   Infected venous access port, initial encounter 01/05/2022   Pancytopenia, acquired (HCC) 01/05/2022   Other skin changes 01/01/2022   Congestion of upper airway 01/01/2022   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   Breast cancer of upper-outer quadrant of left female breast (HCC) 12/02/2021   Essential hypertension    Endometrial cancer (HCC) 09/22/2021   Basal cell carcinoma 06/04/2021    Past Medical History:  Diagnosis Date   Actinic keratosis 06/04/2021   right anteromedial thigh   Anemia    Arthritis    Basal cell carcinoma 06/04/2021   right suprapubic, EDC 07/29/2021   Basal cell carcinoma 07/29/2021   left nasal ala. atypical basaloid cells   Basal cell carcinoma 03/04/2023   Right lateral cheek. Nodular. Referral to radiation oncology.   BCC (basal cell carcinoma of skin) 12/03/2022   superficial right superior shoulder, 03/04/23  ED&C   Family history of lung cancer    GERD (gastroesophageal reflux disease)    History of basal cell carcinoma 07/29/2021   left thigh, EDC at time of bx   History of kidney stones    on CT   History of radiation therapy    vaginal brachytherapy [facility] 12/18/2021-01/13/2022 Dr Antony Blackbird   HLD (hyperlipidemia)    Hypertension    Hypothyroidism    Lymphedema    Pneumonia    HX OF YEARS AGO   PONV (postoperative nausea and vomiting)     Past Surgical History:  Procedure Laterality Date   BREAST BIOPSY Left 11/27/2021   u/s bs, venus clip,  INVASIVE MAMMARY CARCINOMA   BREAST CYST ASPIRATION     BREAST LUMPECTOMY Left 06/16/2022   BREAST LUMPECTOMY WITH RADIOACTIVE SEED LOCALIZATION Left 06/16/2022   Procedure: LEFT BREAST LUMPECTOMY WITH RADIOACTIVE SEED LOCALIZATION;  Surgeon: Emelia Loron, MD;  Location: [facility];  Service: General;  Laterality: Left;   BRONCHIAL BIOPSY  07/10/2022   Procedure: BRONCHIAL BIOPSIES;  Surgeon: Omar Person, MD;  Location: [facility];  Service: Pulmonary;;   BRONCHIAL BRUSHINGS  07/10/2022   Procedure: BRONCHIAL BRUSHINGS;  Surgeon: Omar Person, MD;  Location: [facility];  Service: Pulmonary;;   BRONCHIAL NEEDLE ASPIRATION BIOPSY  07/10/2022   Procedure: BRONCHIAL NEEDLE ASPIRATION BIOPSIES;  Surgeon: Omar Person, MD;  Location: [facility] ENDOSCOPY;  Service: Pulmonary;;   BRONCHIAL WASHINGS  07/10/2022   Procedure: BRONCHIAL WASHINGS;  Surgeon: Omar Person, MD;  Location: [facility] ENDOSCOPY;  Service: Pulmonary;;   cataract Bilateral    FIDUCIAL MARKER PLACEMENT  07/10/2022   Procedure: FIDUCIAL MARKER PLACEMENT;  Surgeon: Omar Person, MD;  Location: [facility] ENDOSCOPY;  Service: Pulmonary;;   HIP SURGERY Bilateral    IR IMAGING GUIDED PORT INSERTION  11/07/2021   IR REMOVAL TUN ACCESS W/ PORT W/O FL MOD SED  01/06/2022   KNEE ARTHROSCOPY Left 2003   NECK SURGERY  1997   Fusion C3, C4, C5   ROBOTIC ASSISTED TOTAL HYSTERECTOMY WITH BILATERAL SALPINGO OOPHERECTOMY Bilateral 10/14/2021   Procedure: XI ROBOTIC ASSISTED TOTAL HYSTERECTOMY WITH BILATERAL SALPINGO OOPHORECTOMY, LYMPH NODE DISSECTION,  LAPAROTOMY;  Surgeon: Carver Fila, MD;  Location: [facility];  Service: Gynecology;  Laterality: Bilateral;   SENTINEL NODE BIOPSY N/A 10/14/2021   Procedure: SENTINEL NODE DISSECTION;  Surgeon: Carver Fila, MD;  Location: [facility];  Service: Gynecology;  Laterality: N/A;   THYROIDECTOMY  2007   TOTAL HIP ARTHROPLASTY Bilateral    Left Hip -2015, Right Hip 208   VIDEO BRONCHOSCOPY WITH RADIAL ENDOBRONCHIAL ULTRASOUND  07/10/2022   Procedure: RADIAL ENDOBRONCHIAL ULTRASOUND;  Surgeon: Omar Person, MD;  Location: [facility] ENDOSCOPY;  Service: Pulmonary;;    No current facility-administered medications for this encounter.   Current Outpatient Medications  Medication Sig Dispense Refill Last Dose/Taking   acetaminophen (TYLENOL) 500 MG tablet Take 1,000 mg by mouth every 6 (six) hours as needed (pain.).   Taking As Needed   cholecalciferol (VITAMIN D3) 25 MCG (1000 UNIT) tablet Take 1,000 Units by mouth in the morning and at bedtime.   Taking   gabapentin (NEURONTIN) 100 MG capsule Take 100 mg by mouth at bedtime.   Taking   levothyroxine (SYNTHROID) 125 MCG tablet Take 125 mcg by mouth daily before breakfast.   Taking   loperamide (IMODIUM A-D) 2 MG tablet 1/2 tablet by mouth every other day (Patient taking differently: Take 1-2 mg by  mouth every other day as needed for diarrhea or loose stools.) 45 tablet 3 Taking Differently   losartan (COZAAR) 100 MG tablet Take 100 mg by mouth in the morning.   Taking   Magnesium Bisglycinate (MAG GLYCINATE PO) Take 240 mg by mouth in the morning and at bedtime.   Taking   Menthol, Topical Analgesic, (BIOFREEZE EX) Apply 1 Application topically 3 (three) times daily as needed (pain.).   Taking As Needed   Multiple Vitamin (MULTIVITAMIN WITH MINERALS) TABS tablet Take 1 tablet by mouth in the morning. Centrum for Adults 50+   Taking   Omega-3 1000 MG CAPS Take 1,000-2,000 mg by mouth See admin instructions. Take 2 capsules by mouth in the morning & take 1 capsule by mouth at night.   Taking   pantoprazole (PROTONIX) 20 MG tablet Take 20 mg by mouth daily as needed for heartburn or indigestion.   Taking As Needed   Probiotic Product (PROBIOTIC PO) Take 1 capsule by mouth in the morning.   Taking   propranolol (INDERAL) 40 MG tablet Take 40-80 mg by mouth See admin instructions. Take 2 tablets (80 mg) by mouth in the morning & take 1 tablet (40 mg) by mouth in the evening.  Taking   rosuvastatin (CRESTOR) 5 MG tablet Take 5 mg by mouth at bedtime.   Taking   Simethicone 125 MG TABS Take 250 mg by mouth 2 (two) times daily as needed (gas).   Taking As Needed   temazepam (RESTORIL) 7.5 MG capsule Take 15 mg by mouth at bedtime as needed for sleep.   Taking As Needed   venlafaxine XR (EFFEXOR-XR) 75 MG 24 hr capsule Take 75 mg by mouth every evening.   Taking   Allergies  Allergen Reactions   Codeine Nausea And Vomiting    In any form per pt   Lactose Other (See Comments)    Gi- Upset   Penicillins Rash   Sulfa Antibiotics Rash   Vancomycin Itching and Rash    Social History   Tobacco Use   Smoking status: Former    Current packs/day: 0.00    Average packs/day: 0.5 packs/day for 3.0 years (1.5 ttl pk-yrs)    Types: Cigarettes    Start date: 12/01/1963    Quit date: 11/30/1966    Years  since quitting: 57.1   Smokeless tobacco: Never  Substance Use Topics   Alcohol use: Yes    Comment: RARE    Family History  Problem Relation Age of Onset   Cancer Father        Lung Cancer   Lung cancer Father        smoked   Skin cancer Maternal Grandmother    Skin cancer Paternal Grandmother    Cancer Paternal Aunt        mouth   Colon cancer Neg Hx    Breast cancer Neg Hx    Ovarian cancer Neg Hx    Endometrial cancer Neg Hx    Pancreatic cancer Neg Hx    Prostate cancer Neg Hx      Review of Systems  Objective:  Physical Exam Vitals reviewed.  Constitutional:      Appearance: Normal appearance. She is normal weight.  HENT:     Head: Normocephalic and atraumatic.  Eyes:     Extraocular Movements: Extraocular movements intact.     Pupils: Pupils are equal, round, and reactive to light.  Cardiovascular:     Rate and Rhythm: Normal rate.  Pulmonary:     Effort: Pulmonary effort is normal.     Breath sounds: Normal breath sounds.  Abdominal:     Palpations: Abdomen is soft.  Musculoskeletal:     Left knee: Effusion, bony tenderness and crepitus present. Decreased range of motion. Tenderness present over the medial joint line and lateral joint line. Abnormal alignment and abnormal meniscus.  Neurological:     Mental Status: She is alert and oriented to person, place, and time.  Psychiatric:        Behavior: Behavior normal.     Vital signs in last 24 hours:    Labs:   Estimated body mass index is 33.66 kg/m as calculated from the following:   Height as of 01/05/24: 5\' 3"  (1.6 m).   Weight as of 01/05/24: 86.2 kg.   Imaging Review Plain radiographs demonstrate severe degenerative joint disease of the left knee(s). The overall alignment ismild varus. The bone quality appears to be good for age and reported activity level.      Assessment/Plan:  End stage arthritis, left knee   The patient history, physical examination, clinical judgment of the  provider and imaging studies are consistent with end stage degenerative joint disease of the left knee(s) and  total knee arthroplasty is deemed medically necessary. The treatment options including medical management, injection therapy arthroscopy and arthroplasty were discussed at length. The risks and benefits of total knee arthroplasty were presented and reviewed. The risks due to aseptic loosening, infection, stiffness, patella tracking problems, thromboembolic complications and other imponderables were discussed. The patient acknowledged the explanation, agreed to proceed with the plan and consent was signed. Patient is being admitted for inpatient treatment for surgery, pain control, PT, OT, prophylactic antibiotics, VTE prophylaxis, progressive ambulation and ADL's and discharge planning. The patient is planning to be discharged home with home health services

## 2024-01-13 ENCOUNTER — Ambulatory Visit (HOSPITAL_COMMUNITY): Payer: Medicare Other | Admitting: Physician Assistant

## 2024-01-13 ENCOUNTER — Other Ambulatory Visit: Payer: Self-pay

## 2024-01-13 ENCOUNTER — Encounter (HOSPITAL_COMMUNITY)
Admission: RE | Disposition: A | Discharge status: Home / Self Care | Payer: Self-pay | Source: Ambulatory Visit | Attending: Orthopaedic Surgery

## 2024-01-13 ENCOUNTER — Observation Stay (HOSPITAL_COMMUNITY)
Admission: RE | Admit: 2024-01-13 | Discharge: 2024-01-15 | Disposition: A | Discharge status: Home / Self Care | Payer: Medicare Other | Source: Ambulatory Visit | Attending: Orthopaedic Surgery | Admitting: Orthopaedic Surgery

## 2024-01-13 ENCOUNTER — Ambulatory Visit (HOSPITAL_BASED_OUTPATIENT_CLINIC_OR_DEPARTMENT_OTHER): Payer: Medicare Other | Admitting: Anesthesiology

## 2024-01-13 ENCOUNTER — Encounter (HOSPITAL_COMMUNITY): Payer: Self-pay | Admitting: Orthopaedic Surgery

## 2024-01-13 ENCOUNTER — Observation Stay (HOSPITAL_COMMUNITY): Payer: Medicare Other

## 2024-01-13 DIAGNOSIS — Z85828 Personal history of other malignant neoplasm of skin: Secondary | ICD-10-CM | POA: Insufficient documentation

## 2024-01-13 DIAGNOSIS — Z96652 Presence of left artificial knee joint: Secondary | ICD-10-CM

## 2024-01-13 DIAGNOSIS — Z79899 Other long term (current) drug therapy: Secondary | ICD-10-CM | POA: Insufficient documentation

## 2024-01-13 DIAGNOSIS — M1712 Unilateral primary osteoarthritis, left knee: Secondary | ICD-10-CM

## 2024-01-13 DIAGNOSIS — E039 Hypothyroidism, unspecified: Secondary | ICD-10-CM | POA: Diagnosis not present

## 2024-01-13 DIAGNOSIS — Z96643 Presence of artificial hip joint, bilateral: Secondary | ICD-10-CM | POA: Insufficient documentation

## 2024-01-13 DIAGNOSIS — Z87891 Personal history of nicotine dependence: Secondary | ICD-10-CM | POA: Diagnosis not present

## 2024-01-13 DIAGNOSIS — I10 Essential (primary) hypertension: Secondary | ICD-10-CM | POA: Insufficient documentation

## 2024-01-13 HISTORY — PX: TOTAL KNEE ARTHROPLASTY: SHX125

## 2024-01-13 SURGERY — ARTHROPLASTY, KNEE, TOTAL
Anesthesia: Spinal | Site: Knee | Laterality: Left

## 2024-01-13 MED ORDER — ONDANSETRON HCL 4 MG/2ML IJ SOLN
4.0000 mg | Freq: Four times a day (QID) | INTRAMUSCULAR | Status: DC | PRN
  Administered 2024-01-14 (×2): 4 mg via INTRAVENOUS
  Filled 2024-01-13 (×2): qty 2

## 2024-01-13 MED ORDER — METOCLOPRAMIDE HCL 5 MG/ML IJ SOLN: 5.0000 mg | Freq: Three times a day (TID) | INTRAMUSCULAR | Status: DC | PRN

## 2024-01-13 MED ORDER — ASPIRIN 81 MG PO CHEW
81.0000 mg | CHEWABLE_TABLET | Freq: Two times a day (BID) | ORAL | Status: DC
  Administered 2024-01-13 – 2024-01-15 (×4): 81 mg via ORAL
  Filled 2024-01-13 (×4): qty 1

## 2024-01-13 MED ORDER — CHLORHEXIDINE GLUCONATE 0.12 % MT SOLN
15.0000 mL | Freq: Once | OROMUCOSAL | Status: AC
  Administered 2024-01-13: 15 mL via OROMUCOSAL

## 2024-01-13 MED ORDER — POVIDONE-IODINE 10 % EX SWAB: 2.0000 | Freq: Once | CUTANEOUS | Status: DC

## 2024-01-13 MED ORDER — VENLAFAXINE HCL ER 75 MG PO CP24
75.0000 mg | ORAL_CAPSULE | Freq: Every evening | ORAL | Status: DC
  Administered 2024-01-14: 75 mg via ORAL
  Filled 2024-01-13: qty 1

## 2024-01-13 MED ORDER — HYDROMORPHONE HCL 2 MG PO TABS
2.0000 mg | ORAL_TABLET | ORAL | Status: DC | PRN
  Administered 2024-01-13 – 2024-01-14 (×2): 2 mg via ORAL
  Filled 2024-01-13 (×2): qty 1

## 2024-01-13 MED ORDER — BUPIVACAINE-EPINEPHRINE (PF) 0.5% -1:200000 IJ SOLN
INTRAMUSCULAR | Status: DC | PRN
  Administered 2024-01-13: 15 mL via PERINEURAL

## 2024-01-13 MED ORDER — TRANEXAMIC ACID 1000 MG/10ML IV SOLN
INTRAVENOUS | Status: DC | PRN
  Administered 2024-01-13: 1000 mg via INTRAVENOUS

## 2024-01-13 MED ORDER — HYDROMORPHONE HCL 1 MG/ML IJ SOLN
0.5000 mg | INTRAMUSCULAR | Status: DC | PRN
  Administered 2024-01-13 – 2024-01-15 (×4): 0.5 mg via INTRAVENOUS
  Filled 2024-01-13 (×4): qty 1

## 2024-01-13 MED ORDER — ALUM & MAG HYDROXIDE-SIMETH 200-200-20 MG/5ML PO SUSP: 30.0000 mL | ORAL | Status: DC | PRN

## 2024-01-13 MED ORDER — BUPIVACAINE-EPINEPHRINE 0.25% -1:200000 IJ SOLN
INTRAMUSCULAR | Status: DC | PRN
  Administered 2024-01-13: 30 mL

## 2024-01-13 MED ORDER — SODIUM CHLORIDE 0.9 % IV SOLN: INTRAVENOUS | Status: DC | PRN

## 2024-01-13 MED ORDER — FENTANYL CITRATE PF 50 MCG/ML IJ SOSY
PREFILLED_SYRINGE | INTRAMUSCULAR | Status: AC
  Administered 2024-01-13: 25 ug via INTRAVENOUS
  Filled 2024-01-13: qty 3

## 2024-01-13 MED ORDER — OXYCODONE HCL 5 MG/5ML PO SOLN: 5.0000 mg | Freq: Once | ORAL | Status: DC | PRN

## 2024-01-13 MED ORDER — PHENYLEPHRINE HCL (PRESSORS) 10 MG/ML IV SOLN
INTRAVENOUS | Status: AC
  Filled 2024-01-13: qty 1

## 2024-01-13 MED ORDER — CEFAZOLIN SODIUM-DEXTROSE 2-4 GM/100ML-% IV SOLN
2.0000 g | INTRAVENOUS | Status: DC
  Filled 2024-01-13: qty 100

## 2024-01-13 MED ORDER — LOSARTAN POTASSIUM 50 MG PO TABS
100.0000 mg | ORAL_TABLET | Freq: Every day | ORAL | Status: DC
  Administered 2024-01-15: 100 mg via ORAL
  Filled 2024-01-13 (×2): qty 2

## 2024-01-13 MED ORDER — OXYCODONE HCL 5 MG PO TABS: 5.0000 mg | ORAL_TABLET | Freq: Once | ORAL | Status: DC | PRN

## 2024-01-13 MED ORDER — SODIUM CHLORIDE 0.9 % IR SOLN
Status: DC | PRN
  Administered 2024-01-13: 1000 mL

## 2024-01-13 MED ORDER — CEFAZOLIN SODIUM-DEXTROSE 2-3 GM-%(50ML) IV SOLR
INTRAVENOUS | Status: DC | PRN
  Administered 2024-01-13: 2 g via INTRAVENOUS

## 2024-01-13 MED ORDER — GABAPENTIN 100 MG PO CAPS
100.0000 mg | ORAL_CAPSULE | Freq: Every day | ORAL | Status: DC
  Administered 2024-01-13 – 2024-01-14 (×2): 100 mg via ORAL
  Filled 2024-01-13 (×2): qty 1

## 2024-01-13 MED ORDER — DOCUSATE SODIUM 100 MG PO CAPS
100.0000 mg | ORAL_CAPSULE | Freq: Two times a day (BID) | ORAL | Status: DC
  Administered 2024-01-13 – 2024-01-15 (×4): 100 mg via ORAL
  Filled 2024-01-13 (×4): qty 1

## 2024-01-13 MED ORDER — CLONIDINE HCL (ANALGESIA) 100 MCG/ML EP SOLN
EPIDURAL | Status: DC | PRN
  Administered 2024-01-13: 100 ug

## 2024-01-13 MED ORDER — PROPRANOLOL HCL 20 MG PO TABS
40.0000 mg | ORAL_TABLET | Freq: Every day | ORAL | Status: DC
  Administered 2024-01-13 – 2024-01-14 (×2): 40 mg via ORAL
  Filled 2024-01-13 (×2): qty 2

## 2024-01-13 MED ORDER — DIPHENHYDRAMINE HCL 12.5 MG/5ML PO ELIX: 12.5000 mg | ORAL_SOLUTION | ORAL | Status: DC | PRN

## 2024-01-13 MED ORDER — ONDANSETRON HCL 4 MG/2ML IJ SOLN
INTRAMUSCULAR | Status: AC
  Filled 2024-01-13: qty 2

## 2024-01-13 MED ORDER — FENTANYL CITRATE (PF) 100 MCG/2ML IJ SOLN
INTRAMUSCULAR | Status: AC
  Filled 2024-01-13: qty 2

## 2024-01-13 MED ORDER — ONDANSETRON HCL 4 MG PO TABS
4.0000 mg | ORAL_TABLET | Freq: Four times a day (QID) | ORAL | Status: DC | PRN
  Administered 2024-01-15 (×2): 4 mg via ORAL
  Filled 2024-01-13 (×2): qty 1

## 2024-01-13 MED ORDER — LACTATED RINGERS IV SOLN: INTRAVENOUS | Status: DC

## 2024-01-13 MED ORDER — METHOCARBAMOL 500 MG PO TABS
500.0000 mg | ORAL_TABLET | Freq: Four times a day (QID) | ORAL | Status: DC | PRN
  Administered 2024-01-13 – 2024-01-15 (×7): 500 mg via ORAL
  Filled 2024-01-13 (×7): qty 1

## 2024-01-13 MED ORDER — PHENOL 1.4 % MT LIQD: 1.0000 | OROMUCOSAL | Status: DC | PRN

## 2024-01-13 MED ORDER — DEXAMETHASONE SODIUM PHOSPHATE 4 MG/ML IJ SOLN
INTRAMUSCULAR | Status: DC | PRN
Start: 2024-01-13 — End: 2024-01-13
  Administered 2024-01-13: 8 mg via INTRAVENOUS

## 2024-01-13 MED ORDER — 0.9 % SODIUM CHLORIDE (POUR BTL) OPTIME
TOPICAL | Status: DC | PRN
  Administered 2024-01-13: 1000 mL

## 2024-01-13 MED ORDER — ONDANSETRON HCL 4 MG/2ML IJ SOLN
INTRAMUSCULAR | Status: DC | PRN
Start: 2024-01-13 — End: 2024-01-13
  Administered 2024-01-13: 4 mg via INTRAVENOUS

## 2024-01-13 MED ORDER — BUPIVACAINE IN DEXTROSE 0.75-8.25 % IT SOLN
INTRATHECAL | Status: DC | PRN
  Administered 2024-01-13: 1.6 mL via INTRATHECAL

## 2024-01-13 MED ORDER — ACETAMINOPHEN 500 MG PO TABS
1000.0000 mg | ORAL_TABLET | Freq: Once | ORAL | Status: DC
  Filled 2024-01-13: qty 2

## 2024-01-13 MED ORDER — ACETAMINOPHEN 325 MG PO TABS
325.0000 mg | ORAL_TABLET | Freq: Four times a day (QID) | ORAL | Status: DC | PRN
  Administered 2024-01-14 – 2024-01-15 (×5): 650 mg via ORAL
  Filled 2024-01-13 (×5): qty 2

## 2024-01-13 MED ORDER — OXYCODONE HCL 5 MG PO TABS: 10.0000 mg | ORAL_TABLET | ORAL | Status: DC | PRN

## 2024-01-13 MED ORDER — TEMAZEPAM 15 MG PO CAPS: 15.0000 mg | ORAL_CAPSULE | Freq: Every evening | ORAL | Status: DC | PRN

## 2024-01-13 MED ORDER — FENTANYL CITRATE PF 50 MCG/ML IJ SOSY
25.0000 ug | PREFILLED_SYRINGE | INTRAMUSCULAR | Status: DC | PRN
  Administered 2024-01-13: 25 ug via INTRAVENOUS
  Administered 2024-01-13: 50 ug via INTRAVENOUS

## 2024-01-13 MED ORDER — PANTOPRAZOLE SODIUM 40 MG PO TBEC
40.0000 mg | DELAYED_RELEASE_TABLET | Freq: Every day | ORAL | Status: DC
  Administered 2024-01-14 – 2024-01-15 (×2): 40 mg via ORAL
  Filled 2024-01-13 (×2): qty 1

## 2024-01-13 MED ORDER — OXYCODONE HCL 5 MG PO TABS
5.0000 mg | ORAL_TABLET | ORAL | Status: DC | PRN
  Filled 2024-01-13: qty 2

## 2024-01-13 MED ORDER — PROPOFOL 500 MG/50ML IV EMUL
INTRAVENOUS | Status: DC | PRN
  Administered 2024-01-13: 50 mg via INTRAVENOUS
  Administered 2024-01-13: 75 ug/kg/min via INTRAVENOUS

## 2024-01-13 MED ORDER — STERILE WATER FOR IRRIGATION IR SOLN
Status: DC | PRN
  Administered 2024-01-13: 1000 mL

## 2024-01-13 MED ORDER — DEXAMETHASONE SODIUM PHOSPHATE 10 MG/ML IJ SOLN
INTRAMUSCULAR | Status: AC
  Filled 2024-01-13: qty 1

## 2024-01-13 MED ORDER — SODIUM CHLORIDE 0.9 % IV SOLN: INTRAVENOUS | Status: AC

## 2024-01-13 MED ORDER — METOCLOPRAMIDE HCL 5 MG PO TABS: 5.0000 mg | ORAL_TABLET | Freq: Three times a day (TID) | ORAL | Status: DC | PRN

## 2024-01-13 MED ORDER — TRANEXAMIC ACID-NACL 1000-0.7 MG/100ML-% IV SOLN
1000.0000 mg | INTRAVENOUS | Status: DC
  Filled 2024-01-13: qty 100

## 2024-01-13 MED ORDER — PROPOFOL 1000 MG/100ML IV EMUL
INTRAVENOUS | Status: AC
  Filled 2024-01-13: qty 100

## 2024-01-13 MED ORDER — PROPRANOLOL HCL 20 MG PO TABS
80.0000 mg | ORAL_TABLET | Freq: Every day | ORAL | Status: DC
  Administered 2024-01-15: 80 mg via ORAL
  Filled 2024-01-13 (×2): qty 4

## 2024-01-13 MED ORDER — PHENYLEPHRINE HCL-NACL 20-0.9 MG/250ML-% IV SOLN
INTRAVENOUS | Status: DC | PRN
Start: 2024-01-13 — End: 2024-01-13
  Administered 2024-01-13: 40 ug/min via INTRAVENOUS

## 2024-01-13 MED ORDER — ORAL CARE MOUTH RINSE: 15.0000 mL | Freq: Once | OROMUCOSAL | Status: AC

## 2024-01-13 MED ORDER — BUPIVACAINE-EPINEPHRINE 0.25% -1:200000 IJ SOLN
INTRAMUSCULAR | Status: AC
  Filled 2024-01-13: qty 1

## 2024-01-13 MED ORDER — MENTHOL 3 MG MT LOZG: 1.0000 | LOZENGE | OROMUCOSAL | Status: DC | PRN

## 2024-01-13 MED ORDER — METHOCARBAMOL 1000 MG/10ML IJ SOLN: 500.0000 mg | Freq: Four times a day (QID) | INTRAMUSCULAR | Status: DC | PRN

## 2024-01-13 MED ORDER — FENTANYL CITRATE (PF) 100 MCG/2ML IJ SOLN
INTRAMUSCULAR | Status: DC | PRN
  Administered 2024-01-13: 50 ug via INTRAVENOUS
  Administered 2024-01-13 (×2): 25 ug via INTRAVENOUS

## 2024-01-13 MED ORDER — LEVOTHYROXINE SODIUM 125 MCG PO TABS
125.0000 ug | ORAL_TABLET | Freq: Every day | ORAL | Status: DC
  Administered 2024-01-14 – 2024-01-15 (×2): 125 ug via ORAL
  Filled 2024-01-13 (×2): qty 1

## 2024-01-13 SURGICAL SUPPLY — 54 items
BAG COUNTER SPONGE SURGICOUNT (BAG) IMPLANT
BAG ZIPLOCK 12X15 (MISCELLANEOUS) ×1 IMPLANT
BENZOIN TINCTURE PRP APPL 2/3 (GAUZE/BANDAGES/DRESSINGS) IMPLANT
BLADE SAG 18X100X1.27 (BLADE) ×1 IMPLANT
BLADE SURG SZ10 CARB STEEL (BLADE) IMPLANT
BNDG ELASTIC 6INX 5YD STR LF (GAUZE/BANDAGES/DRESSINGS) ×2 IMPLANT
BOWL SMART MIX CTS (DISPOSABLE) IMPLANT
CEMENT BONE R 1X40 (Cement) IMPLANT
COMP FEM CEMT PERSONA STD SZ7 (Knees) ×1 IMPLANT
COMP TIB PS KNEE D 0D LT (Joint) ×1 IMPLANT
COMPONENT FEM CMT PRSN STD SZ7 (Knees) IMPLANT
COMPONET TIB PS KNEE D 0D LT (Joint) IMPLANT
COOLER ICEMAN CLASSIC (MISCELLANEOUS) ×1 IMPLANT
COVER SURGICAL LIGHT HANDLE (MISCELLANEOUS) ×1 IMPLANT
CUFF TRNQT CYL 34X4.125X (TOURNIQUET CUFF) ×1 IMPLANT
DRAPE INCISE IOBAN 66X45 STRL (DRAPES) ×1 IMPLANT
DRAPE U-SHAPE 47X51 STRL (DRAPES) ×1 IMPLANT
DURAPREP 26ML APPLICATOR (WOUND CARE) ×1 IMPLANT
ELECT BLADE TIP CTD 4 INCH (ELECTRODE) ×1 IMPLANT
ELECT REM PT RETURN 15FT ADLT (MISCELLANEOUS) ×1 IMPLANT
GAUZE PAD ABD 8X10 STRL (GAUZE/BANDAGES/DRESSINGS) ×2 IMPLANT
GAUZE SPONGE 4X4 12PLY STRL (GAUZE/BANDAGES/DRESSINGS) ×1 IMPLANT
GAUZE XEROFORM 1X8 LF (GAUZE/BANDAGES/DRESSINGS) IMPLANT
GLOVE BIO SURGEON STRL SZ7.5 (GLOVE) ×1 IMPLANT
GLOVE BIOGEL PI IND STRL 8 (GLOVE) ×2 IMPLANT
GLOVE ECLIPSE 8.0 STRL XLNG CF (GLOVE) ×1 IMPLANT
GOWN STRL REUS W/ TWL XL LVL3 (GOWN DISPOSABLE) ×2 IMPLANT
HOLDER FOLEY CATH W/STRAP (MISCELLANEOUS) IMPLANT
IMMOBILIZER KNEE 20 (SOFTGOODS) ×1 IMPLANT
IMMOBILIZER KNEE 20 THIGH 36 (SOFTGOODS) ×1 IMPLANT
INSERT TIB ASF 14 6-7/CD LT (Insert) IMPLANT
KIT TURNOVER KIT A (KITS) IMPLANT
MANIFOLD NEPTUNE II (INSTRUMENTS) ×1 IMPLANT
NS IRRIG 1000ML POUR BTL (IV SOLUTION) ×1 IMPLANT
PACK TOTAL KNEE CUSTOM (KITS) ×1 IMPLANT
PAD COLD SHLDR WRAP-ON (PAD) ×1 IMPLANT
PADDING CAST COTTON 6X4 STRL (CAST SUPPLIES) ×2 IMPLANT
PIN DRILL HDLS TROCAR 75 4PK (PIN) IMPLANT
PROTECTOR NERVE ULNAR (MISCELLANEOUS) ×1 IMPLANT
SCREW FEMALE HEX FIX 25X2.5 (ORTHOPEDIC DISPOSABLE SUPPLIES) IMPLANT
SET HNDPC FAN SPRY TIP SCT (DISPOSABLE) ×1 IMPLANT
SET PAD KNEE POSITIONER (MISCELLANEOUS) ×1 IMPLANT
SPIKE FLUID TRANSFER (MISCELLANEOUS) IMPLANT
STAPLER SKIN PROX WIDE 3.9 (STAPLE) IMPLANT
STEM POLY PAT PLY 32M KNEE (Knees) IMPLANT
STRIP CLOSURE SKIN 1/2X4 (GAUZE/BANDAGES/DRESSINGS) IMPLANT
SUT MNCRL AB 4-0 PS2 18 (SUTURE) IMPLANT
SUT VIC AB 0 CT1 27XBRD ANTBC (SUTURE) ×1 IMPLANT
SUT VIC AB 1 CT1 36 (SUTURE) ×2 IMPLANT
SUT VIC AB 2-0 CT1 TAPERPNT 27 (SUTURE) ×2 IMPLANT
TOWEL GREEN STERILE FF (TOWEL DISPOSABLE) ×1 IMPLANT
TRAY FOLEY MTR SLVR 14FR STAT (SET/KITS/TRAYS/PACK) IMPLANT
WATER STERILE IRR 1000ML POUR (IV SOLUTION) ×2 IMPLANT
YANKAUER SUCT BULB TIP NO VENT (SUCTIONS) ×1 IMPLANT

## 2024-01-13 NOTE — Anesthesia Procedure Notes (Signed)
Spinal  Patient location during procedure: OR Start time: 01/13/2024 7:27 AM End time: 01/13/2024 7:30 AM Reason for block: surgical anesthesia Staffing Performed: anesthesiologist  Anesthesiologist: Kaylyn Layer, MD Performed by: Kaylyn Layer, MD Authorized by: Kaylyn Layer, MD   Preanesthetic Checklist Completed: patient identified, IV checked, risks and benefits discussed, surgical consent, monitors and equipment checked, pre-op evaluation and timeout performed Spinal Block Patient position: sitting Prep: DuraPrep and site prepped and draped Patient monitoring: continuous pulse ox, blood pressure and heart rate Approach: midline Location: L3-4 Injection technique: single-shot Needle Needle type: Pencan  Needle gauge: 24 G Needle length: 9 cm Assessment Events: CSF return Additional Notes Risks, benefits, and alternative discussed. Patient gave consent to procedure. Prepped and draped in sitting position. Patient sedated but responsive to voice. Clear CSF obtained after one needle pass. Positive terminal aspiration. No pain or paraesthesias with injection. Patient tolerated procedure well. Vital signs stable. Amalia Greenhouse, MD

## 2024-01-13 NOTE — Anesthesia Postprocedure Evaluation (Signed)
Anesthesia Post Note  Patient: TRACEE MCCREERY  Procedure(s) Performed: LEFT TOTAL KNEE ARTHROPLASTY (Left: Knee)     Patient location during evaluation: PACU Anesthesia Type: Spinal Level of consciousness: awake and alert Pain management: pain level controlled Vital Signs Assessment: post-procedure vital signs reviewed and stable Respiratory status: spontaneous breathing, nonlabored ventilation and respiratory function stable Cardiovascular status: blood pressure returned to baseline Postop Assessment: no apparent nausea or vomiting, spinal receding, no headache and no backache Anesthetic complications: no   No notable events documented.  Last Vitals:  Vitals:   01/13/24 1000 01/13/24 1015  BP: 129/69 135/72  Pulse: (!) 58 (!) 58  Resp: 17 16  Temp:  (!) 36.4 C  SpO2: 93% 97%    Last Pain:  Vitals:   01/13/24 1015  TempSrc:   PainSc: 0-No pain                 Shanda Howells

## 2024-01-13 NOTE — Transfer of Care (Signed)
Immediate Anesthesia Transfer of Care Note  Patient: Kristin Carlson  Procedure(s) Performed: LEFT TOTAL KNEE ARTHROPLASTY (Left: Knee)  Patient Location: PACU  Anesthesia Type:General  Level of Consciousness: alert , sedated, and responds to stimulation  Airway & Oxygen Therapy: Patient connected to face mask  Post-op Assessment: Report given to RN  Post vital signs: stable  Last Vitals:  Vitals Value Taken Time  BP 119/63 01/13/24 0918  Temp    Pulse 69 01/13/24 0920  Resp 13 01/13/24 0920  SpO2 99 % 01/13/24 0920  Vitals shown include unfiled device data.  Last Pain:  Vitals:   01/13/24 0614  TempSrc:   PainSc: 4       Patients Stated Pain Goal: 4 (01/13/24 1610)  Complications: No notable events documented.

## 2024-01-13 NOTE — Evaluation (Signed)
Physical Therapy Evaluation Patient Details Name: Kristin Carlson MRN: [id number] DOB: 27-May-1941 Today's Date: 01/13/2024  History of Present Illness  83 yo female s/p L TKA on 01/13/24. PMH: bil THA, HTN  Clinical Impression  Pt is s/p TKA resulting in the deficits listed below (see PT Problem List).  Pt amb 20' with RW and min assist. Distance limited by mild dizziness which resolved in sitting. Anticipate steady progress in acute setting. Pt supportive  husband and dtr present for session.  Pt will benefit from acute skilled PT to increase their independence and safety with mobility to allow discharge.          If plan is discharge home, recommend the following: A little help with walking and/or transfers;A little help with bathing/dressing/bathroom;Assist for transportation;Help with stairs or ramp for entrance;Assistance with cooking/housework   Can travel by private vehicle        Equipment Recommendations None recommended by PT  Recommendations for Other Services       Functional Status Assessment Patient has had a recent decline in their functional status and demonstrates the ability to make significant improvements in function in a reasonable and predictable amount of time.     Precautions / Restrictions Precautions Precautions: Fall;Knee Restrictions Weight Bearing Restrictions Per Provider Order: No Other Position/Activity Restrictions: WBAT      Mobility  Bed Mobility Overal bed mobility: Needs Assistance Bed Mobility: Supine to Sit     Supine to sit: Min assist     General bed mobility comments: incr time, assist  with LLE    Transfers Overall transfer level: Needs assistance Equipment used: Rolling walker (2 wheels) Transfers: Sit to/from Stand Sit to Stand: Min assist           General transfer comment: cues for hand placement and LLE position    Ambulation/Gait Ambulation/Gait assistance: Min assist Gait Distance (Feet): 20 Feet Assistive  device: Rolling walker (2 wheels) Gait Pattern/deviations: Step-to pattern, Decreased stance time - left, Decreased weight shift to left       General Gait Details: cues for sequence and RW position  Stairs            Wheelchair Mobility     Tilt Bed    Modified Rankin (Stroke Patients Only)       Balance Overall balance assessment: Mild deficits observed, not formally tested                                           Pertinent Vitals/Pain Pain Assessment Pain Assessment: 0-10 Pain Score: 3  Pain Location: L knee and thigh Pain Descriptors / Indicators: Aching, Discomfort Pain Intervention(s): Limited activity within patient's tolerance, Monitored during session, Premedicated before session, Repositioned, Ice applied    Home Living Family/patient expects to be discharged to:: Private residence Living Arrangements: Spouse/significant other;Children (dtr assisting)   Type of Home: House Home Access: Stairs to enter   [employer] of Steps: 1 ~4 inch step   Home Layout: One level Home Equipment: Agricultural consultant (2 wheels);Cane - single point      Prior Function Prior Level of Function : Independent/Modified Independent                     Extremity/Trunk Assessment   Upper Extremity Assessment Upper Extremity Assessment: Overall WFL for tasks assessed    Lower Extremity Assessment Lower Extremity Assessment:  LLE deficits/detail LLE Deficits / Details: ankle WFL, knee extension and hip flexion ~2+/5, minimal quad lag/anticipated post op deficits       Communication   Communication Communication: No apparent difficulties    Cognition Arousal: Alert Behavior During Therapy: WFL for tasks assessed/performed   PT - Cognitive impairments: No apparent impairments                         Following commands: Intact       Cueing Cueing Techniques: Verbal cues     General Comments      Exercises Total  Joint Exercises Ankle Circles/Pumps: AROM, Both, 10 reps Quad Sets: AROM, Both, 5 reps   Assessment/Plan    PT Assessment Patient needs continued PT services  PT Problem List Decreased strength;Decreased range of motion;Decreased activity tolerance;Decreased balance;Decreased knowledge of use of DME;Decreased mobility;Pain       PT Treatment Interventions DME instruction;Gait training;Functional mobility training;Therapeutic activities;Patient/family education;Stair training;Therapeutic exercise    PT Goals (Current goals can be found in the Care Plan section)  Acute Rehab PT Goals Patient Stated Goal: be more active/walking PT Goal Formulation: With patient Time For Goal Achievement: 01/20/24 Potential to Achieve Goals: Good    Frequency 7X/week     Co-evaluation               AM-PAC PT "6 Clicks" Mobility  Outcome Measure Help needed turning from your back to your side while in a flat bed without using bedrails?: A Little Help needed moving from lying on your back to sitting on the side of a flat bed without using bedrails?: A Little Help needed moving to and from a bed to a chair (including a wheelchair)?: A Little Help needed standing up from a chair using your arms (e.g., wheelchair or bedside chair)?: A Little Help needed to walk in hospital room?: A Little Help needed climbing 3-5 steps with a railing? : A Lot 6 Click Score: 17    End of Session Equipment Utilized During Treatment: Gait belt;Left knee immobilizer Activity Tolerance: Patient tolerated treatment well Patient left: in chair;with call bell/phone within reach;with family/visitor present;with chair alarm set Nurse Communication: Mobility status PT Visit Diagnosis: Other abnormalities of gait and mobility (R26.89)    Time: 1610-9604 PT Time Calculation (min) (ACUTE ONLY): 34 min   Charges:   PT Evaluation $PT Eval Low Complexity: 1 Low PT Treatments $Gait Training: 8-22 mins PT General  Charges $$ ACUTE PT VISIT: 1 Visit         Dorothye Berni, PT  Acute Rehab Dept [facility]) [phone]  01/13/2024   [facility] 01/13/2024, 4:34 PM

## 2024-01-13 NOTE — Anesthesia Procedure Notes (Signed)
Anesthesia Regional Block: Adductor canal block   Pre-Anesthetic Checklist: , timeout performed,  Correct Patient, Correct Site, Correct Laterality,  Correct Procedure, Correct Position, site marked,  Risks and benefits discussed,  Pre-op evaluation,  At surgeon's request and post-op pain management  Laterality: Left  Prep: Maximum Sterile Barrier Precautions used, chloraprep       Needles:  Injection technique: Single-shot  Needle Type: Echogenic Stimulator Needle     Needle Length: 9cm  Needle Gauge: 22     Additional Needles:   Procedures:,,,, ultrasound used (permanent image in chart),,    Narrative:  Start time: 01/13/2024 7:03 AM End time: 01/13/2024 7:06 AM Injection made incrementally with aspirations every 5 mL.  Performed by: Personally  Anesthesiologist: Kaylyn Layer, MD  Additional Notes: Risks, benefits, and alternative discussed. Patient gave consent for procedure. Patient prepped and draped in sterile fashion. Sedation administered, patient remains easily responsive to voice. Relevant anatomy identified with ultrasound guidance. Local anesthetic given in 5cc increments with no signs or symptoms of intravascular injection. No pain or paraesthesias with injection. Patient monitored throughout procedure with signs of LAST or immediate complications. Tolerated well. Ultrasound image placed in chart.  Amalia Greenhouse, MD

## 2024-01-13 NOTE — Interval H&P Note (Signed)
History and Physical Interval Note: The patient understands that she is here today for a left total knee replacement to treat her significant left knee pain and arthritis.  There has been no acute or interval change in her medical status.  The risks and benefits of surgery have been discussed in detail and informed consent has been obtained.  The left operative knee has been marked.  01/13/2024 7:11 AM  Primus Bravo  has presented today for surgery, with the diagnosis of Left knee osteoarthritis.  The various methods of treatment have been discussed with the patient and family. After consideration of risks, benefits and other options for treatment, the patient has consented to  Procedure(s): LEFT TOTAL KNEE ARTHROPLASTY (Left) as a surgical intervention.  The patient's history has been reviewed, patient examined, no change in status, stable for surgery.  I have reviewed the patient's chart and labs.  Questions were answered to the patient's satisfaction.     Kathryne Hitch

## 2024-01-13 NOTE — Anesthesia Procedure Notes (Signed)
Procedure Name: MAC Date/Time: 01/13/2024 7:22 AM  Performed by: Micki Riley, CRNAPre-anesthesia Checklist: Patient identified, Emergency Drugs available, Suction available, Patient being monitored and Timeout performed Patient Re-evaluated:Patient Re-evaluated prior to induction Oxygen Delivery Method: Simple face mask Preoxygenation: Pre-oxygenation with 100% oxygen Induction Type: IV induction

## 2024-01-13 NOTE — Op Note (Signed)
Operative Note  Date of operation: 01/13/2024 Preoperative diagnosis: Left knee primary osteoarthritis Postoperative diagnosis: Same  Procedure: Left cemented total knee arthroplasty  Implants: Biomet/Zimmer persona cemented knee system Implant Name Type Inv. Item Serial No. Manufacturer Lot No. LRB No. Used Action  CEMENT BONE R 1X40 - [id number] Cement CEMENT BONE R 1X40  ZIMMER RECON(ORTH,TRAU,BIO,SG) [id number] Left 1 Implanted  INSERT TIB ASF 14 6-7/CD LT - [id number] Insert INSERT TIB ASF 14 6-7/CD LT  ZIMMER RECON(ORTH,TRAU,BIO,SG) [id number] Left 1 Implanted  STEM POLY PAT PLY 43M KNEE - [id number] Knees STEM POLY PAT PLY 43M KNEE  ZIMMER RECON(ORTH,TRAU,BIO,SG) [id number] Left 1 Implanted  COMP FEM CEMT PERSONA STD SZ7 - [id number] Knees COMP FEM CEMT PERSONA STD SZ7  ZIMMER RECON(ORTH,TRAU,BIO,SG) [id number] Left 1 Implanted  COMP TIB PS KNEE D 0D LT - [id number] Joint COMP TIB PS KNEE D 0D LT  ZIMMER RECON(ORTH,TRAU,BIO,SG) [id number] Left 1 Implanted   Surgeon: Vanita Panda. Magnus Ivan, MD Assistant: Rexene Edison, PA-C  Anesthesia: #1 left lower extremity adductor canal block, #2 spinal, #3 local Tourniquet time: 45 minutes EBL: Less than 50 cc Antibiotics: IV Ancef Complications: None  Indications: The patient is an 83 year old active female with debilitating arthritis involving her left knee.  This has been well-documented clinical exam and x-ray findings.  She has tried and failed conservative treatment for well over a year including multiple injections.  At this point her left knee pain is daily and it is detrimentally affecting her mobility, her quality of life and her actives daily living to the point she wished to proceed with a knee replacement and we agreed with this as well.  We did discuss the risks of acute blood loss anemia, nerve or vessel injury, fracture, infection, DVT and implant failure.  She understands that our goals are hopefully decreased pain, improved mobility,  and improved quality of life.  Procedure description: After informed consent was obtained and the appropriate left knee was marked, anesthesia obtained a left lower extremity adductor canal block in the holding room.  The patient was then brought to the operating room and set up on the stretcher where spinal anesthesia was obtained.  She was then laid in supine position and a Foley catheter was placed.  A nonsterile tractors placed around her upper left thigh and her left thigh, knee, leg and ankle were prepped and draped with DuraPrep and sterile drapes including a sterile stockinette.  A timeout was called and she was identified as the correct patient the correct left knee.  An Esmarch was then used to wrap out the left leg and the tourniquet was inflated to 300 mm of pressure.  With the knee extended a direct midline incision was made over the patella and carried proximally distally.  Dissection was carried down to the knee joint and a medial parapatellar arthrotomy was made finding a moderate joint effusion.  With the knee in a flexed position we found significant cartilage loss throughout the knee.  Osteophytes removed from all 3 compartments as well as remnants of the medial lateral meniscus and the ACL.  Using an extramedullary based cutting guide we made our proximal tibia cut correction for varus and valgus and a 7 degree slope.  We made this cut to take 2 mm off the low side.  We then used a intramedullary based cutting guide for distal femur cut setting this for a left knee at 5 degrees externally rotated and a 10 mm distal femoral cut.  We brought the knee back  down to full extension and she actually hyperextended slightly with a 10 mm block.  We then went back to the femur and put a femoral sizing guide based off the epicondylar axis.  Based off of this we chose a size 7 femur.  We put a 4-in-1 cutting block for a size 7 femur and made our anterior and posterior cuts followed our chamfer cuts.  We  then backed the tibia and chose a size D left tibial tray for coverage over the tibial plateau setting the rotation of the tibial tubercle and the femur.  We did our drill hole and keel punch off of this.  We then trialed our size D left tibial tray combined with our size 7 left CR standard femur.  We trialed up to a 14 mm thickness left medial congruent polythene insert we are pleased the range of motion and stability without insert.  We then made a patella cut and drilled 3 holes for a size 32 patella button.  Again we are pleased the range of motion and stability with all trial implants in the knee.  We then removed all trial implants from the knee and irrigated the knee with normal saline solution.  We placed Marcaine with epinephrine around the arthrotomy.  Next we mixed our cement and with the knee in a flexed position we cemented our Biomet/Zimmer persona tibial tray for a left knee size D followed by cementing our size 7 left CR standard femur.  We cleaned excess cement debris from the knee and placed our 14 mm thickness left polythene insert which was a medial congruent insert.  We then cemented our size 32 patella button.  We then held the knee fully extended and compressed while the cement hardened.  Once it hardened the tourniquet was let down and hemostasis was obtained with electrocautery.  The arthrotomy was closed with interrupted #1 Vicryl suture followed by 0 Vicryl to Goza deep tissue and 2-0 Vicryl to close the subcutaneous tissue.  The skin was closed with staples.  Well-padded sterile dressing was applied.  The patient was taken to the recovery room.  Rexene Edison, PA-C did assist during the entire case and beginning to end and his assistance was crucial and medically necessary for soft tissue management and retraction, helping guide implant placement and a layered closure of the wound.

## 2024-01-14 ENCOUNTER — Other Ambulatory Visit (HOSPITAL_COMMUNITY): Payer: Self-pay

## 2024-01-14 ENCOUNTER — Encounter (HOSPITAL_COMMUNITY): Payer: Self-pay | Admitting: Orthopaedic Surgery

## 2024-01-14 DIAGNOSIS — M1712 Unilateral primary osteoarthritis, left knee: Secondary | ICD-10-CM | POA: Diagnosis not present

## 2024-01-14 LAB — BASIC METABOLIC PANEL
Anion gap: 7 (ref 5–15)
BUN: 19 mg/dL (ref 8–23)
CO2: 25 mmol/L (ref 22–32)
Calcium: 8.2 mg/dL — ABNORMAL LOW (ref 8.9–10.3)
Chloride: 103 mmol/L (ref 98–111)
Creatinine, Ser: 0.79 mg/dL (ref 0.44–1.00)
GFR, Estimated: >60 mL/min (ref >60)
Glucose, Bld: 162 mg/dL — ABNORMAL HIGH (ref 70–99)
Potassium: 3.7 mmol/L (ref 3.5–5.1)
Sodium: 135 mmol/L (ref 135–145)

## 2024-01-14 LAB — CBC
HCT: 34.8 % — ABNORMAL LOW (ref 36.0–46.0)
Hemoglobin: 11.1 g/dL — ABNORMAL LOW (ref 12.0–15.0)
MCH: 30.2 pg (ref 26.0–34.0)
MCHC: 31.9 g/dL (ref 30.0–36.0)
MCV: 94.6 fL (ref 80.0–100.0)
Platelets: 164 10*3/uL (ref 150–400)
RBC: 3.68 MIL/uL — ABNORMAL LOW (ref 3.87–5.11)
RDW: 12.8 % (ref 11.5–15.5)
WBC: 9.9 10*3/uL (ref 4.0–10.5)
nRBC: 0 % (ref 0.0–0.2)

## 2024-01-14 MED ORDER — HYDROMORPHONE HCL 4 MG PO TABS
4.0000 mg | ORAL_TABLET | ORAL | 0 refills | Status: DC | PRN
  Filled 2024-01-14: qty 30, 5d supply, fill #0

## 2024-01-14 MED ORDER — ASPIRIN 81 MG PO CHEW
81.0000 mg | CHEWABLE_TABLET | Freq: Two times a day (BID) | ORAL | 0 refills | Status: DC
  Filled 2024-01-14: qty 30, 15d supply, fill #0

## 2024-01-14 MED ORDER — HYDROMORPHONE HCL 2 MG PO TABS: 2.0000 mg | ORAL_TABLET | ORAL | Status: DC | PRN

## 2024-01-14 MED ORDER — SODIUM CHLORIDE 0.9 % IV BOLUS
500.0000 mL | Freq: Once | INTRAVENOUS | Status: AC
  Administered 2024-01-14: 500 mL via INTRAVENOUS

## 2024-01-14 MED ORDER — TIZANIDINE HCL 2 MG PO TABS
2.0000 mg | ORAL_TABLET | Freq: Four times a day (QID) | ORAL | 0 refills | Status: DC | PRN
  Filled 2024-01-14: qty 30, 8d supply, fill #0

## 2024-01-14 MED ORDER — HYDROMORPHONE HCL 2 MG PO TABS
4.0000 mg | ORAL_TABLET | ORAL | Status: DC | PRN
  Administered 2024-01-14 – 2024-01-15 (×4): 4 mg via ORAL
  Filled 2024-01-14 (×5): qty 2

## 2024-01-14 NOTE — Progress Notes (Addendum)
 Physical Therapy Treatment Patient Details Name: Kristin Carlson MRN: [id number] DOB: 1940-12-04 Today's Date: 01/14/2024   History of Present Illness 83 yo female s/p L TKA on 01/13/24. PMH: bil THA, HTN    PT Comments  POD # 1 am session Pt already OOB in recliner.  Assisted with amb in hallway.  General Gait Details: cues for sequence and RW position limited distance due to increased c/o dizziness then hypotensive.  Recliner following.After amb 12 feet in hallway, pt c/o MAX dizziness and nausea.  Pale. BP standing was 75/46(56)  HR 68  RA 97%.  Pt quickly assisted to recliner and placed in recovery supine.  RN called to room.  Performed a few TE's followed by ICE.  Educated Daughter on ICE machine use and proper application. Will see pt again this afternoon.     If plan is discharge home, recommend the following: A little help with walking and/or transfers;A little help with bathing/dressing/bathroom;Assist for transportation;Help with stairs or ramp for entrance;Assistance with cooking/housework   Can travel by private vehicle        Equipment Recommendations  None recommended by PT    Recommendations for Other Services       Precautions / Restrictions Precautions Precautions: Fall;Knee Restrictions Weight Bearing Restrictions Per Provider Order: No LLE Weight Bearing Per Provider Order: Weight bearing as tolerated     Mobility  Bed Mobility               General bed mobility comments: OOB in recliner    Transfers Overall transfer level: Needs assistance Equipment used: Rolling walker (2 wheels) Transfers: Sit to/from Stand Sit to Stand: Min assist           General transfer comment: cues for hand placement and LLE position plus increased time    Ambulation/Gait Ambulation/Gait assistance: Min assist Gait Distance (Feet): 16 Feet Assistive device: Rolling walker (2 wheels) Gait Pattern/deviations: Step-to pattern, Decreased stance time - left, Decreased  weight shift to left       General Gait Details: cues for sequence and RW position limited distance due to increased c/o dizziness then hypotensive.  Recliner following.   Stairs             Wheelchair Mobility     Tilt Bed    Modified Rankin (Stroke Patients Only)       Balance                                            Communication    Cognition Arousal: Alert Behavior During Therapy: WFL for tasks assessed/performed   PT - Cognitive impairments: No apparent impairments                       PT - Cognition Comments: AxO x 3 pleasant following all commands        Cueing    Exercises  Total Knee Replacement TE's following HEP handout 10 reps B LE ankle pumps 05 reps towel squeezes 05 reps knee presses 05 reps heel slides  05 reps SAQ's 05 reps SLR's 05 reps ABD Educated on use of gait belt to assist with TE's Followed by ICE     General Comments        Pertinent Vitals/Pain Pain Assessment Pain Assessment: 0-10 Pain Score: 3  Pain Location: L knee Pain Descriptors / Indicators: Aching,  Discomfort, Operative site guarding Pain Intervention(s): Monitored during session, Premedicated before session, Repositioned, Ice applied    Home Living                          Prior Function            PT Goals (current goals can now be found in the care plan section) Progress towards PT goals: Progressing toward goals    Frequency    7X/week      PT Plan      Co-evaluation              AM-PAC PT "6 Clicks" Mobility   Outcome Measure  Help needed turning from your back to your side while in a flat bed without using bedrails?: A Little Help needed moving from lying on your back to sitting on the side of a flat bed without using bedrails?: A Little Help needed moving to and from a bed to a chair (including a wheelchair)?: A Little Help needed standing up from a chair using your arms (e.g.,  wheelchair or bedside chair)?: A Little Help needed to walk in hospital room?: A Little Help needed climbing 3-5 steps with a railing? : A Little 6 Click Score: 18    End of Session Equipment Utilized During Treatment: Gait belt Activity Tolerance: Other (comment) (Hypotensive/dizzy) Patient left: in chair;with call bell/phone within reach;with family/visitor present;with chair alarm set Nurse Communication: Mobility status PT Visit Diagnosis: Other abnormalities of gait and mobility (R26.89)     Time: 1610-9604 PT Time Calculation (min) (ACUTE ONLY): 32 min  Charges:    $Gait Training: 8-22 mins $Therapeutic Exercise: 8-22 mins PT General Charges $$ ACUTE PT VISIT: 1 Visit                     Felecia Shelling  PTA Acute  Rehabilitation Services Office M-F          [phone]

## 2024-01-14 NOTE — Progress Notes (Signed)
Patient up with PT felt dizzy, nauseated. BP 75/46 assisted to chair.  B/p back up to 140/65 in 15 minutes Dr Magnus Ivan notified with orders received  DFranklin Rn

## 2024-01-14 NOTE — TOC Transition Note (Signed)
Transition of Care [facility]) - Discharge Note   Patient Details  Name: Kristin Carlson MRN: [id number] Date of Birth: Apr 05, 1941  [facility]) CM/SW Contact:  Amada Jupiter, LCSW Phone Number: 01/14/2024, 11:05 AM   Clinical Narrative:     Met with pt who confirms she has needed DME in the home.  HHPT prearranged with [facility] by ortho MD office prior to surgery.  No further TOC needs.  Final next level of care: Home w Home Health Services Barriers to Discharge: No Barriers Identified   Patient Goals and CMS Choice Patient states their goals for this hospitalization and ongoing recovery are:: return home          Discharge Placement                       Discharge Plan and Services Additional resources added to the After Visit Summary for                  DME Arranged: N/A DME Agency: NA       HH Arranged: PT HH Agency: [facility]        Social Drivers of Health (SDOH) Interventions SDOH Screenings   Food Insecurity: No Food Insecurity (01/13/2024)  Housing: Low Risk  (01/13/2024)  Transportation Needs: No Transportation Needs (01/13/2024)  Utilities: Not At Risk (01/13/2024)  Depression (PHQ2-9): Low Risk  (05/06/2022)  Financial Resource Strain: Low Risk  (11/08/2023)   Received from [facility]  Physical Activity: Insufficiently Active (10/08/2021)   Received from [facility]  Social Connections: Socially Integrated (01/13/2024)  Stress: Stress Concern Present (10/08/2021)   [facility] from [facility]  Tobacco Use: Medium Risk (01/13/2024)  Health Literacy: Low Risk  (10/09/2022)   Received from [facility]     Readmission Risk Interventions     No data to display

## 2024-01-14 NOTE — Plan of Care (Signed)
  Problem: Education: Goal: Knowledge of General Education information will improve Description: Including pain rating scale, medication(s)/side effects and non-pharmacologic comfort measures Outcome: Progressing   Problem: Clinical Measurements: Goal: Ability to maintain clinical measurements within normal limits will improve Outcome: Progressing   Problem: Activity: Goal: Range of joint motion will improve Outcome: Progressing   Problem: Pain Management: Goal: Pain level will decrease with appropriate interventions Outcome: Progressing

## 2024-01-14 NOTE — Progress Notes (Signed)
 Physical Therapy Treatment Patient Details Name: Kristin Carlson MRN: [id number] DOB: 05-Jul-1941 Today's Date: 01/14/2024   History of Present Illness 83 yo female s/p L TKA on 01/13/24. PMH: bil THA, HTN    PT Comments  POD # 1 pm session Pt received a Bolus and ate lunch.  Assisted with amb in hallway was "better".  No c/o dizziness.  Tolerated an increased distance.  BP 146/78  Then returned to room to perform some TE's following HEP handout.  Instructed on proper tech, freq as well as use of ICE.   Pt NOT quite ready to D/C today due to pain management and this mornings episode.  Reported to RN.  Physical Therapy to return in morning.   If plan is discharge home, recommend the following: A little help with walking and/or transfers;A little help with bathing/dressing/bathroom;Assist for transportation;Help with stairs or ramp for entrance;Assistance with cooking/housework   Can travel by private vehicle        Equipment Recommendations  None recommended by PT    Recommendations for Other Services       Precautions / Restrictions Precautions Precautions: Fall;Knee Restrictions Weight Bearing Restrictions Per Provider Order: No LLE Weight Bearing Per Provider Order: Weight bearing as tolerated     Mobility  Bed Mobility               General bed mobility comments: OOB in recliner    Transfers Overall transfer level: Needs assistance Equipment used: Rolling walker (2 wheels) Transfers: Sit to/from Stand Sit to Stand: Supervision, Contact guard assist           General transfer comment: cues for hand placement and LLE position plus increased time with daughter    Ambulation/Gait Ambulation/Gait assistance: Contact guard assist, Supervision Gait Distance (Feet): 28 Feet Assistive device: Rolling walker (2 wheels) Gait Pattern/deviations: Step-to pattern, Decreased stance time - left, Decreased weight shift to left Gait velocity: decreased     General Gait  Details: tolerated an increased distance feeling "better" BP 146/78 with "rosey" cheecks   Stairs             Wheelchair Mobility     Tilt Bed    Modified Rankin (Stroke Patients Only)       Balance                                            Communication    Cognition Arousal: Alert Behavior During Therapy: WFL for tasks assessed/performed   PT - Cognitive impairments: No apparent impairments                       PT - Cognition Comments: AxO x 3 pleasant following all commands        Cueing    Exercises  05 reps seated TKR TE's    General Comments        Pertinent Vitals/Pain Pain Assessment Pain Assessment: 0-10 Pain Score: 3  Pain Location: L knee Pain Descriptors / Indicators: Aching, Discomfort, Operative site guarding Pain Intervention(s): Monitored during session, Premedicated before session, Repositioned, Ice applied    Home Living                          Prior Function            PT Goals (current goals can  now be found in the care plan section) Progress towards PT goals: Progressing toward goals    Frequency    7X/week      PT Plan      Co-evaluation              AM-PAC PT "6 Clicks" Mobility   Outcome Measure  Help needed turning from your back to your side while in a flat bed without using bedrails?: A Little Help needed moving from lying on your back to sitting on the side of a flat bed without using bedrails?: A Little Help needed moving to and from a bed to a chair (including a wheelchair)?: A Little Help needed standing up from a chair using your arms (e.g., wheelchair or bedside chair)?: A Little Help needed to walk in hospital room?: A Little Help needed climbing 3-5 steps with a railing? : A Little 6 Click Score: 18    End of Session Equipment Utilized During Treatment: Gait belt Activity Tolerance: Patient tolerated treatment well Patient left: in chair;with call  bell/phone within reach;with family/visitor present;with chair alarm set Nurse Communication: Mobility status PT Visit Diagnosis: Other abnormalities of gait and mobility (R26.89)     Time: 1610-9604 PT Time Calculation (min) (ACUTE ONLY): 34 min  Charges:    $Gait Training: 8-22 mins $Therapeutic Exercise: 8-22 mins PT General Charges $$ ACUTE PT VISIT: 1 Visit                     Felecia Shelling  PTA Acute  Rehabilitation Services Office M-F          [phone]

## 2024-01-14 NOTE — Progress Notes (Signed)
Subjective: 1 Day Post-Op Procedure(s) (LRB): LEFT TOTAL KNEE ARTHROPLASTY (Left) Patient reports pain as moderate.    Objective: Vital signs in last 24 hours: Temp:  [97.1 F (36.2 C)-98.8 F (37.1 C)] 98.4 F (36.9 C) (02/14 0608) Pulse Rate:  [57-73] 67 (02/14 0608) Resp:  [10-27] 16 (02/14 0608) BP: (117-139)/(63-89) 136/73 (02/14 0608) SpO2:  [92 %-100 %] 98 % (02/14 4098)  Intake/Output from previous day: 02/13 0701 - 02/14 0700 In: 2777.3 [P.O.:960; I.V.:1817.3] Out: 2425 [Urine:2275; Blood:150] Intake/Output this shift: No intake/output data recorded.  Recent Labs    01/14/24 0321  HGB 11.1*   Recent Labs    01/14/24 0321  WBC 9.9  RBC 3.68*  HCT 34.8*  PLT 164   Recent Labs    01/14/24 0321  NA 135  K 3.7  CL 103  CO2 25  BUN 19  CREATININE 0.79  GLUCOSE 162*  CALCIUM 8.2*   No results for input(s): "LABPT", "INR" in the last 72 hours.  Sensation intact distally Intact pulses distally Dorsiflexion/Plantar flexion intact Incision: dressing C/D/I Compartment soft   Assessment/Plan: 1 Day Post-Op Procedure(s) (LRB): LEFT TOTAL KNEE ARTHROPLASTY (Left) Up with therapy Discharge home with home health this afternoon.      Kristin Carlson 01/14/2024, 7:08 AM

## 2024-01-14 NOTE — Progress Notes (Signed)
Physical Therapy  After amb 12 feet in hallway, pt c/o MAX dizziness and nausea.  Pale. BP standing was 75/46(56)  HR 68  RA 97%.  Pt quickly assisted to recliner and placed in recovery supine.  RN called to room.  Felecia Shelling  PTA Acute  Rehabilitation Services Office M-F          [phone]

## 2024-01-14 NOTE — Progress Notes (Signed)
TOC discharge meds in a secure bag delivered to pt in room - given to pt's daughter by this RN

## 2024-01-14 NOTE — Care Management Obs Status (Signed)
MEDICARE OBSERVATION STATUS NOTIFICATION   Patient Details  Name: Kristin Carlson MRN: [id number] Date of Birth: [birth year]   Medicare Observation Status Notification Given:       Amada Jupiter, LCSW 01/14/2024, 11:04 AM

## 2024-01-15 ENCOUNTER — Other Ambulatory Visit (HOSPITAL_COMMUNITY): Payer: Self-pay

## 2024-01-15 DIAGNOSIS — M1712 Unilateral primary osteoarthritis, left knee: Secondary | ICD-10-CM | POA: Diagnosis not present

## 2024-01-15 MED ORDER — ONDANSETRON 4 MG PO TBDP
4.0000 mg | ORAL_TABLET | Freq: Three times a day (TID) | ORAL | 0 refills | Status: DC | PRN
  Filled 2024-01-15: qty 20, 7d supply, fill #0

## 2024-01-15 NOTE — Progress Notes (Signed)
 Physical Therapy Treatment Patient Details Name: Kristin Carlson MRN: [id number] DOB: [birth year] Today's Date: 01/15/2024   History of Present Illness 83 yo female s/p L TKA on 01/13/24. PMH: bil THA, HTN    PT Comments  Pt progressing well this session, no dizziness. Will likely be ready to d/c this pm.    If plan is discharge home, recommend the following: A little help with walking and/or transfers;A little help with bathing/dressing/bathroom;Assist for transportation;Help with stairs or ramp for entrance;Assistance with cooking/housework   Can travel by private vehicle        Equipment Recommendations  None recommended by PT    Recommendations for Other Services       Precautions / Restrictions Precautions Precautions: Fall;Knee Recall of Precautions/Restrictions: Intact Restrictions Weight Bearing Restrictions Per Provider Order: No LLE Weight Bearing Per Provider Order: Weight bearing as tolerated     Mobility  Bed Mobility               General bed mobility comments: OOB in recliner    Transfers Overall transfer level: Needs assistance Equipment used: Rolling walker (2 wheels) Transfers: Sit to/from Stand Sit to Stand: Min assist, Contact guard assist           General transfer comment: cues for hand placement and LLE position plus increased time with daughter assisting    Ambulation/Gait Ambulation/Gait assistance: Contact guard assist, Supervision Gait Distance (Feet): 55 Feet Assistive device: Rolling walker (2 wheels) Gait Pattern/deviations: Step-to pattern, Decreased stance time - left, Decreased weight shift to left       General Gait Details: no dizziness, steady with RW adn demonstrates carryover of  correct sequence   Stairs             Wheelchair Mobility     Tilt Bed    Modified Rankin (Stroke Patients Only)       Balance                                            Communication  Communication Communication: No apparent difficulties  Cognition Arousal: Alert Behavior During Therapy: WFL for tasks assessed/performed   PT - Cognitive impairments: No apparent impairments                         Following commands: Intact      Cueing Cueing Techniques: Verbal cues  Exercises Total Joint Exercises Ankle Circles/Pumps: AROM, Both, 10 reps Quad Sets: AROM, Strengthening, 10 reps, Both Heel Slides: AAROM, Left, 10 reps Knee Flexion: AAROM, AROM, Left, 5 reps, Seated Goniometric ROM: ~8 to 55 degrees    General Comments        Pertinent Vitals/Pain Pain Assessment Pain Assessment: 0-10 Pain Score: 4  Pain Location: L knee Pain Descriptors / Indicators: Aching, Discomfort, Operative site guarding Pain Intervention(s): Limited activity within patient's tolerance, Monitored during session, Premedicated before session, Ice applied    Home Living                          Prior Function            PT Goals (current goals can now be found in the care plan section) Acute Rehab PT Goals Patient Stated Goal: be more active/walking PT Goal Formulation: With patient Time For Goal Achievement: 01/20/24 Potential to Achieve  Goals: Good Progress towards PT goals: Progressing toward goals    Frequency    7X/week      PT Plan      Co-evaluation              AM-PAC PT "6 Clicks" Mobility   Outcome Measure  Help needed turning from your back to your side while in a flat bed without using bedrails?: A Little Help needed moving from lying on your back to sitting on the side of a flat bed without using bedrails?: A Little Help needed moving to and from a bed to a chair (including a wheelchair)?: A Little Help needed standing up from a chair using your arms (e.g., wheelchair or bedside chair)?: A Little Help needed to walk in hospital room?: A Little Help needed climbing 3-5 steps with a railing? : A Little 6 Click Score: 18     End of Session Equipment Utilized During Treatment: Gait belt Activity Tolerance: Patient tolerated treatment well Patient left: in chair;with call bell/phone within reach;with family/visitor present Nurse Communication: Mobility status PT Visit Diagnosis: Other abnormalities of gait and mobility (R26.89)     Time: 7846-9629 PT Time Calculation (min) (ACUTE ONLY): 33 min  Charges:    $Gait Training: 8-22 mins $Therapeutic Exercise: 8-22 mins PT General Charges $$ ACUTE PT VISIT: 1 Visit                     Shatora Weatherbee, PT  Acute Rehab Dept [facility]) [phone]  01/15/2024    [facility] 01/15/2024, 10:50 AM

## 2024-01-15 NOTE — Progress Notes (Signed)
 Patient ID: Kristin Carlson, female   DOB: August 28, 1941, 83 y.o.   MRN: [id number] Much improved today.  Currently working with PT.  Can be discharged to home today.

## 2024-01-15 NOTE — Progress Notes (Signed)
 PT TX NOTE   01/15/24 1400  PT Visit Information  Last PT Received On 01/15/24  Assistance Needed Pt continues to make excellent progress, meeting goals and is ready to d/c from PT standpoint. Dtr will be assisting prn  History of Present Illness 83 yo female s/p L TKA on 01/13/24. PMH: bil THA, HTN  Subjective Data  Patient Stated Goal be more active/walking  Precautions  Precautions Fall;Knee  Recall of Precautions/Restrictions Intact  Restrictions  Weight Bearing Restrictions Per Provider Order No  LLE Weight Bearing Per Provider Order WBAT  Pain Assessment  Pain Assessment 0-10  Pain Score 4  Pain Location L knee  Pain Descriptors / Indicators Aching;Discomfort;Operative site guarding  Pain Intervention(s) Limited activity within patient's tolerance;Monitored during session;Premedicated before session  Cognition  Arousal Alert  Behavior During Therapy WFL for tasks assessed/performed  PT - Cognitive impairments No apparent impairments  Following Commands  Following commands Intact  Cueing  Cueing Techniques Verbal cues  Communication  Communication No apparent difficulties  Bed Mobility  General bed mobility comments OOB in recliner  Transfers  Overall transfer level Needs assistance  Equipment used Rolling walker (2 wheels)  Transfers Sit to/from Stand  Sit to Stand Contact guard assist  General transfer comment cues for hand placement and LLE position plus increased time with daughter assisting  Ambulation/Gait  Ambulation/Gait assistance Contact guard assist;Supervision  Gait Distance (Feet) 20 Feet  Assistive device Rolling walker (2 wheels)  Gait Pattern/deviations Step-to pattern;Decreased stance time - left;Decreased weight shift to left  General Gait Details no dizziness, steady with RW and demonstrates carryover of  correct sequence  Stairs Yes  Stairs assistance Min assist;Contact guard assist  Stair Management Step to pattern;With walker;Backwards  Number  of Stairs 1  General stair comments cues for sequence and technique; dtr assisting  PT - End of Session  Equipment Utilized During Treatment Gait belt  Activity Tolerance Patient tolerated treatment well  Patient left in chair;with call bell/phone within reach;with family/visitor present  Nurse Communication Mobility status   PT - Assessment/Plan  PT Visit Diagnosis Other abnormalities of gait and mobility (R26.89)  PT Frequency (ACUTE ONLY) 7X/week  Follow Up Recommendations Follow physician's recommendations for discharge plan and follow up therapies  Patient can return home with the following A little help with walking and/or transfers;A little help with bathing/dressing/bathroom;Assist for transportation;Help with stairs or ramp for entrance;Assistance with cooking/housework  PT equipment None recommended by PT  AM-PAC PT "6 Clicks" Mobility Outcome Measure (Version 2)  Help needed turning from your back to your side while in a flat bed without using bedrails? 3  Help needed moving from lying on your back to sitting on the side of a flat bed without using bedrails? 3  Help needed moving to and from a bed to a chair (including a wheelchair)? 3  Help needed standing up from a chair using your arms (e.g., wheelchair or bedside chair)? 3  Help needed to walk in hospital room? 3  Help needed climbing 3-5 steps with a railing?  3  6 Click Score 18  Consider Recommendation of Discharge To: Home with [facility]  PT Goal Progression  Progress towards PT goals Progressing toward goals  Acute Rehab PT Goals  PT Goal Formulation With patient  Time For Goal Achievement 01/20/24  Potential to Achieve Goals Good  PT Time Calculation  PT Start Time (ACUTE ONLY) 1400  PT Stop Time (ACUTE ONLY) 1416  PT Time Calculation (min) (ACUTE ONLY) 16  min  PT General Charges  $$ ACUTE PT VISIT 1 Visit  PT Treatments  $Gait Training 8-22 mins

## 2024-01-15 NOTE — Discharge Summary (Signed)
 Patient ID: Kristin Carlson MRN: [id number] DOB/AGE: 12-21-1940 83 y.o.  Admit date: 01/13/2024 Discharge date: 01/15/2024  Admission Diagnoses:  Principal Problem:   Unilateral primary osteoarthritis, left knee Active Problems:   Status post total left knee replacement   Discharge Diagnoses:  Same  Past Medical History:  Diagnosis Date   Actinic keratosis 06/04/2021   right anteromedial thigh   Anemia    Arthritis    Basal cell carcinoma 06/04/2021   right suprapubic, EDC 07/29/2021   Basal cell carcinoma 07/29/2021   left nasal ala. atypical basaloid cells   Basal cell carcinoma 03/04/2023   Right lateral cheek. Nodular. Referral to radiation oncology.   BCC (basal cell carcinoma of skin) 12/03/2022   superficial right superior shoulder, 03/04/23  ED&C   Family history of lung cancer    GERD (gastroesophageal reflux disease)    History of basal cell carcinoma 07/29/2021   left thigh, EDC at time of bx   History of kidney stones    on CT   History of radiation therapy    vaginal brachytherapy [facility] 12/18/2021-01/13/2022 Dr Antony Blackbird   HLD (hyperlipidemia)    Hypertension    Hypothyroidism    Lymphedema    Pneumonia    HX OF YEARS AGO   PONV (postoperative nausea and vomiting)     Surgeries: Procedure(s): LEFT TOTAL KNEE ARTHROPLASTY on 01/13/2024   Consultants:   Discharged Condition: Improved  Hospital Course: Kristin Carlson is an 83 y.o. female who was admitted 01/13/2024 for operative treatment ofUnilateral primary osteoarthritis, left knee. Patient has severe unremitting pain that affects sleep, daily activities, and work/hobbies. After pre-op clearance the patient was taken to the operating room on 01/13/2024 and underwent  Procedure(s): LEFT TOTAL KNEE ARTHROPLASTY.    Patient was given perioperative antibiotics:  Anti-infectives (From admission, onward)    Start     Dose/Rate Route Frequency Ordered Stop   01/13/24 0600  ceFAZolin (ANCEF) IVPB 2g/100 mL  premix  Status:  Discontinued        2 g 200 mL/hr over 30 Minutes Intravenous On call to O.R. 01/13/24 6213 01/13/24 1246        Patient was given sequential compression devices, early ambulation, and chemoprophylaxis to prevent DVT.  Patient benefited maximally from hospital stay and there were no complications.    Recent vital signs: Patient Vitals for the past 24 hrs:  BP Temp Temp src Pulse Resp SpO2  01/15/24 0629 127/72 98.3 F (36.8 C) -- 69 18 98 %  01/14/24 2139 136/67 98.7 F (37.1 C) Oral 64 17 92 %  01/14/24 1800 -- 98.1 F (36.7 C) Oral -- -- --  01/14/24 1437 137/69 97.6 F (36.4 C) Oral 72 18 99 %  01/14/24 1049 135/86 -- -- 67 20 94 %     Recent laboratory studies:  Recent Labs    01/14/24 0321  WBC 9.9  HGB 11.1*  HCT 34.8*  PLT 164  NA 135  K 3.7  CL 103  CO2 25  BUN 19  CREATININE 0.79  GLUCOSE 162*  CALCIUM 8.2*     Discharge Medications:   Allergies as of 01/15/2024       Reactions   Codeine Nausea And Vomiting   In any form per pt   Lactose Other (See Comments)   Gi- Upset   Penicillins Rash   Sulfa Antibiotics Rash   Vancomycin Itching, Rash        Medication List  TAKE these medications    acetaminophen 500 MG tablet Commonly known as: TYLENOL Take 1,000 mg by mouth every 6 (six) hours as needed (pain.).   Aspirin Low Dose 81 MG chewable tablet Generic drug: aspirin Chew 1 tablet (81 mg total) by mouth 2 (two) times daily.   BIOFREEZE EX Apply 1 Application topically 3 (three) times daily as needed (pain.).   cholecalciferol 25 MCG (1000 UNIT) tablet Commonly known as: VITAMIN D3 Take 1,000 Units by mouth in the morning and at bedtime.   gabapentin 100 MG capsule Commonly known as: NEURONTIN Take 100 mg by mouth at bedtime.   HYDROmorphone 4 MG tablet Commonly known as: Dilaudid Take 1 tablet (4 mg total) by mouth every 4 (four) hours as needed for severe pain (pain score 7-10).   levothyroxine 125 MCG  tablet Commonly known as: SYNTHROID Take 125 mcg by mouth daily before breakfast.   loperamide 2 MG tablet Commonly known as: IMODIUM A-D 1/2 tablet by mouth every other day What changed:  how much to take how to take this when to take this reasons to take this additional instructions   losartan 100 MG tablet Commonly known as: COZAAR Take 100 mg by mouth in the morning.   MAG GLYCINATE PO Take 240 mg by mouth in the morning and at bedtime.   multivitamin with minerals Tabs tablet Take 1 tablet by mouth in the morning. Centrum for Adults 50+   Omega-3 1000 MG Caps Take 1,000-2,000 mg by mouth See admin instructions. Take 2 capsules by mouth in the morning & take 1 capsule by mouth at night.   ondansetron 4 MG disintegrating tablet Commonly known as: ZOFRAN-ODT Take 1 tablet (4 mg total) by mouth every 8 (eight) hours as needed for nausea or vomiting.   pantoprazole 20 MG tablet Commonly known as: PROTONIX Take 20 mg by mouth daily as needed for heartburn or indigestion.   PROBIOTIC PO Take 1 capsule by mouth in the morning.   propranolol 40 MG tablet Commonly known as: INDERAL Take 40-80 mg by mouth See admin instructions. Take 2 tablets (80 mg) by mouth in the morning & take 1 tablet (40 mg) by mouth in the evening.   rosuvastatin 5 MG tablet Commonly known as: CRESTOR Take 5 mg by mouth at bedtime.   Simethicone 125 MG Tabs Take 250 mg by mouth 2 (two) times daily as needed (gas).   temazepam 7.5 MG capsule Commonly known as: RESTORIL Take 15 mg by mouth at bedtime as needed for sleep.   tiZANidine 2 MG tablet Commonly known as: ZANAFLEX Take 1 tablet (2 mg total) by mouth every 6 (six) hours as needed.   venlafaxine XR 75 MG 24 hr capsule Commonly known as: EFFEXOR-XR Take 75 mg by mouth every evening.               Durable Medical Equipment  (From admission, onward)           Start     Ordered   01/13/24 1323  DME 3 n 1  Once         01/13/24 1322   01/13/24 1323  DME Walker rolling  Once       Question Answer Comment  Walker: With 5 Inch Wheels   Patient needs a walker to treat with the following condition Status post total left knee replacement      01/13/24 1322            Diagnostic Studies:  DG Knee Left Port Result Date: 01/13/2024 CLINICAL DATA:  Status post left knee replacement. EXAM: PORTABLE LEFT KNEE - 1-2 VIEW COMPARISON:  None Available. FINDINGS: Left knee arthroplasty in expected alignment. No periprosthetic lucency or fracture. There has been patellar resurfacing. Recent postsurgical change includes air and edema in the soft tissues and joint space. Anterior skin staples. IMPRESSION: Left knee arthroplasty without immediate postoperative complication. Electronically Signed   By: Narda Rutherford M.D.   On: 01/13/2024 10:01   CT HEAD WO CONTRAST ( ) Result Date: 12/22/2023 CLINICAL DATA:  Fall tripped on rug EXAM: CT HEAD WITHOUT CONTRAST CT CERVICAL SPINE WITHOUT CONTRAST TECHNIQUE: Multidetector CT imaging of the head and cervical spine was performed following the standard protocol without intravenous contrast. Multiplanar CT image reconstructions of the cervical spine were also generated. RADIATION DOSE REDUCTION: This exam was performed according to the departmental dose-optimization program which includes automated exposure control, adjustment of the mA and/or kV according to patient size and/or use of iterative reconstruction technique. COMPARISON:  None Available. FINDINGS: CT HEAD FINDINGS Brain: No acute territorial infarction, hemorrhage or intracranial mass. Moderate white matter hypodensity consistent with chronic small vessel ischemic change. Mild atrophy. Non enlarged ventricles Vascular: No hyperdense vessels.  Carotid vascular calcification Skull: Normal. Negative for fracture or focal lesion. Sinuses/Orbits: No acute finding. Other: None CT CERVICAL SPINE FINDINGS Alignment: 3 mm retrolisthesis  C3 on C4. Facet alignment is maintained. Skull base and vertebrae: Craniovertebral junction is intact. Vertebral body heights are maintained. Small osseous densities anterior and posterior to the left C1-C2 articulation appear corticated and could reflect remote injury or nonspecific calcification. No definite acute fracture is seen. Incomplete fusion of the posterior arch of C1. Soft tissues and spinal canal: No prevertebral fluid or swelling. No visible canal hematoma. Disc levels: Anterior fusion hardware C4 through C6 with interbody devices and solid bone fusion. Advanced disc space narrowing and degenerative change at C3-C4 and C6-C7. Moderate severe bilateral foraminal narrowing at C3-C4 and C6-C7. Upper chest: Left apical lung nodule with fiducial marker again visualized. This measures about 14 x 12 mm. Right apical scarring. Other: None IMPRESSION: 1. No CT evidence for acute intracranial abnormality. 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 2. Anterior fusion hardware C4 through C6. Advanced degenerative changes at C3-C4 and C6-C7. No definite acute osseous abnormality. Electronically Signed   By: Jasmine Pang M.D.   On: 12/22/2023 23:54   CT Cervical Spine Wo Contrast Result Date: 12/22/2023 CLINICAL DATA:  Fall tripped on rug EXAM: CT HEAD WITHOUT CONTRAST CT CERVICAL SPINE WITHOUT CONTRAST TECHNIQUE: Multidetector CT imaging of the head and cervical spine was performed following the standard protocol without intravenous contrast. Multiplanar CT image reconstructions of the cervical spine were also generated. RADIATION DOSE REDUCTION: This exam was performed according to the departmental dose-optimization program which includes automated exposure control, adjustment of the mA and/or kV according to patient size and/or use of iterative reconstruction technique. COMPARISON:  None Available. FINDINGS: CT HEAD FINDINGS Brain: No acute territorial infarction, hemorrhage or  intracranial mass. Moderate white matter hypodensity consistent with chronic small vessel ischemic change. Mild atrophy. Non enlarged ventricles Vascular: No hyperdense vessels.  Carotid vascular calcification Skull: Normal. Negative for fracture or focal lesion. Sinuses/Orbits: No acute finding. Other: None CT CERVICAL SPINE FINDINGS Alignment: 3 mm retrolisthesis C3 on C4. Facet alignment is maintained. Skull base and vertebrae: Craniovertebral junction is intact. Vertebral body heights are maintained. Small osseous densities anterior and posterior to the  left C1-C2 articulation appear corticated and could reflect remote injury or nonspecific calcification. No definite acute fracture is seen. Incomplete fusion of the posterior arch of C1. Soft tissues and spinal canal: No prevertebral fluid or swelling. No visible canal hematoma. Disc levels: Anterior fusion hardware C4 through C6 with interbody devices and solid bone fusion. Advanced disc space narrowing and degenerative change at C3-C4 and C6-C7. Moderate severe bilateral foraminal narrowing at C3-C4 and C6-C7. Upper chest: Left apical lung nodule with fiducial marker again visualized. This measures about 14 x 12 mm. Right apical scarring. Other: None IMPRESSION: 1. No CT evidence for acute intracranial abnormality. 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 2. Anterior fusion hardware C4 through C6. Advanced degenerative changes at C3-C4 and C6-C7. No definite acute osseous abnormality. Electronically Signed   By: Jasmine Pang M.D.   On: 12/22/2023 23:54    Disposition: Discharge disposition: 01-Home or Self Care            Signed: Kathryne Hitch 01/15/2024, 10:36 AM

## 2024-01-15 NOTE — Plan of Care (Signed)
  Problem: Education: Goal: Knowledge of General Education information will improve Description: Including pain rating scale, medication(s)/side effects and non-pharmacologic comfort measures Outcome: Adequate for Discharge   Problem: Health Behavior/Discharge Planning: Goal: Ability to manage health-related needs will improve Outcome: Adequate for Discharge   Problem: Clinical Measurements: Goal: Ability to maintain clinical measurements within normal limits will improve Outcome: Adequate for Discharge Goal: Will remain free from infection Outcome: Adequate for Discharge Goal: Diagnostic test results will improve Outcome: Adequate for Discharge Goal: Respiratory complications will improve Outcome: Adequate for Discharge Goal: Cardiovascular complication will be avoided Outcome: Adequate for Discharge   Problem: Activity: Goal: Risk for activity intolerance will decrease Outcome: Adequate for Discharge   Problem: Nutrition: Goal: Adequate nutrition will be maintained Outcome: Adequate for Discharge   Problem: Coping: Goal: Level of anxiety will decrease Outcome: Adequate for Discharge   Problem: Elimination: Goal: Will not experience complications related to bowel motility Outcome: Adequate for Discharge Goal: Will not experience complications related to urinary retention Outcome: Adequate for Discharge   Problem: Pain Managment: Goal: General experience of comfort will improve and/or be controlled Outcome: Adequate for Discharge   Problem: Safety: Goal: Ability to remain free from injury will improve Outcome: Adequate for Discharge   Problem: Skin Integrity: Goal: Risk for impaired skin integrity will decrease Outcome: Adequate for Discharge   Problem: Education: Goal: Knowledge of the prescribed therapeutic regimen will improve Outcome: Adequate for Discharge Goal: Individualized Educational Video(s) Outcome: Adequate for Discharge   Problem:  Activity: Goal: Ability to avoid complications of mobility impairment will improve Outcome: Adequate for Discharge Goal: Range of joint motion will improve Outcome: Adequate for Discharge   Problem: Clinical Measurements: Goal: Postoperative complications will be avoided or minimized Outcome: Adequate for Discharge   Problem: Pain Management: Goal: Pain level will decrease with appropriate interventions Outcome: Adequate for Discharge   Problem: Skin Integrity: Goal: Will show signs of wound healing Outcome: Adequate for Discharge

## 2024-01-15 NOTE — Progress Notes (Signed)
 TOC med ( ondansetron) delivered in a secure bag to the patient in her room w/ other d/c meds

## 2024-01-15 NOTE — Discharge Instructions (Signed)
 Per [facility] clinic policy, our goal is ensure optimal postoperative pain control with a multimodal pain management strategy. For all [facility] patients, our goal is to wean post-operative narcotic medications by 6 weeks post-operatively. If this is not possible due to utilization of pain medication prior to surgery, your [facility] doctor will support your acute post-operative pain control for the first 6 weeks postoperatively, with a plan to transition you back to your primary pain team following that. Kristin Carlson will work to ensure a Therapist, occupational.  INSTRUCTIONS AFTER JOINT REPLACEMENT   Remove items at home which could result in a fall. This includes throw rugs or furniture in walking pathways ICE to the affected joint every three hours while awake for 30 minutes at a time, for at least the first 3-5 days, and then as needed for pain and swelling.  Continue to use ice for pain and swelling. You may notice swelling that will progress down to the foot and ankle.  This is normal after surgery.  Elevate your leg when you are not up walking on it.   Continue to use the breathing machine you got in the hospital (incentive spirometer) which will help keep your temperature down.  It is common for your temperature to cycle up and down following surgery, especially at night when you are not up moving around and exerting yourself.  The breathing machine keeps your lungs expanded and your temperature down.   DIET:  As you were doing prior to hospitalization, we recommend a well-balanced diet.  DRESSING / WOUND CARE / SHOWERING  Keep the surgical dressing until follow up.  The dressing is water proof, so you can shower without any extra covering.  IF THE DRESSING FALLS OFF or the wound gets wet inside, change the dressing with sterile gauze.  Please use good hand washing techniques before changing the dressing.  Do not use any lotions or creams on the incision until instructed by your surgeon.     ACTIVITY  Increase activity slowly as tolerated, but follow the weight bearing instructions below.   No driving for 6 weeks or until further direction given by your physician.  You cannot drive while taking narcotics.  No lifting or carrying greater than 10 lbs. until further directed by your surgeon. Avoid periods of inactivity such as sitting longer than an hour when not asleep. This helps prevent blood clots.  You may return to work once you are authorized by your doctor.     WEIGHT BEARING   Weight bearing as tolerated with assist device (walker, cane, etc) as directed, use it as long as suggested by your surgeon or therapist, typically at least 4-6 weeks.   EXERCISES  Results after joint replacement surgery are often greatly improved when you follow the exercise, range of motion and muscle strengthening exercises prescribed by your doctor. Safety measures are also important to protect the joint from further injury. Any time any of these exercises cause you to have increased pain or swelling, decrease what you are doing until you are comfortable again and then slowly increase them. If you have problems or questions, call your caregiver or physical therapist for advice.   Rehabilitation is important following a joint replacement. After just a few days of immobilization, the muscles of the leg can become weakened and shrink (atrophy).  These exercises are designed to build up the tone and strength of the thigh and leg muscles and to improve motion. Often times heat used for twenty to thirty minutes before  working out will loosen up your tissues and help with improving the range of motion but do not use heat for the first two weeks following surgery (sometimes heat can increase post-operative swelling).   These exercises can be done on a training (exercise) mat, on the floor, on a table or on a bed. Use whatever works the best and is most comfortable for you.    Use music or television  while you are exercising so that the exercises are a pleasant break in your day. This will make your life better with the exercises acting as a break in your routine that you can look forward to.   Perform all exercises about fifteen times, three times per day or as directed.  You should exercise both the operative leg and the other leg as well.  Exercises include:   Quad Sets - Tighten up the muscle on the front of the thigh (Quad) and hold for 5-10 seconds.   Straight Leg Raises - With your knee straight (if you were given a brace, keep it on), lift the leg to 60 degrees, hold for 3 seconds, and slowly lower the leg.  Perform this exercise against resistance later as your leg gets stronger.  Leg Slides: Lying on your back, slowly slide your foot toward your buttocks, bending your knee up off the floor (only go as far as is comfortable). Then slowly slide your foot back down until your leg is flat on the floor again.  Angel Wings: Lying on your back spread your legs to the side as far apart as you can without causing discomfort.  Hamstring Strength:  Lying on your back, push your heel against the floor with your leg straight by tightening up the muscles of your buttocks.  Repeat, but this time bend your knee to a comfortable angle, and push your heel against the floor.  You may put a pillow under the heel to make it more comfortable if necessary.   A rehabilitation program following joint replacement surgery can speed recovery and prevent re-injury in the future due to weakened muscles. Contact your doctor or a physical therapist for more information on knee rehabilitation.    CONSTIPATION  Constipation is defined medically as fewer than three stools per week and severe constipation as less than one stool per week.  Even if you have a regular bowel pattern at home, your normal regimen is likely to be disrupted due to multiple reasons following surgery.  Combination of anesthesia, postoperative  narcotics, change in appetite and fluid intake all can affect your bowels.   YOU MUST use at least one of the following options; they are listed in order of increasing strength to get the job done.  They are all available over the counter, and you may need to use some, POSSIBLY even all of these options:    Drink plenty of fluids (prune juice may be helpful) and high fiber foods Colace 100 mg by mouth twice a day  Senokot for constipation as directed and as needed Dulcolax (bisacodyl), take with full glass of water  Miralax (polyethylene glycol) once or twice a day as needed.  If you have tried all these things and are unable to have a bowel movement in the first 3-4 days after surgery call either your surgeon or your primary doctor.    If you experience loose stools or diarrhea, hold the medications until you stool forms back up.  If your symptoms do not get better within 1 week  or if they get worse, check with your doctor.  If you experience "the worst abdominal pain ever" or develop nausea or vomiting, please contact the office immediately for further recommendations for treatment.   ITCHING:  If you experience itching with your medications, try taking only a single pain pill, or even half a pain pill at a time.  You can also use Benadryl over the counter for itching or also to help with sleep.   TED HOSE STOCKINGS:  Use stockings on both legs until for at least 2 weeks or as directed by physician office. They may be removed at night for sleeping.  MEDICATIONS:  See your medication summary on the "After Visit Summary" that nursing will review with you.  You may have some home medications which will be placed on hold until you complete the course of blood thinner medication.  It is important for you to complete the blood thinner medication as prescribed.  PRECAUTIONS:  If you experience chest pain or shortness of breath - call 911 immediately for transfer to the hospital emergency department.    If you develop a fever greater that 101 F, purulent drainage from wound, increased redness or drainage from wound, foul odor from the wound/dressing, or calf pain - CONTACT YOUR SURGEON.                                                   FOLLOW-UP APPOINTMENTS:  If you do not already have a post-op appointment, please call the office for an appointment to be seen by your surgeon.  Guidelines for how soon to be seen are listed in your "After Visit Summary", but are typically between 1-4 weeks after surgery.  OTHER INSTRUCTIONS:   Knee Replacement:  Do not place pillow under knee, focus on keeping the knee straight while resting. CPM instructions: 0-90 degrees, 2 hours in the morning, 2 hours in the afternoon, and 2 hours in the evening. Place foam block, curve side up under heel at all times except when in CPM or when walking.  DO NOT modify, tear, cut, or change the foam block in any way.  POST-OPERATIVE OPIOID TAPER INSTRUCTIONS: It is important to wean off of your opioid medication as soon as possible. If you do not need pain medication after your surgery it is ok to stop day one. Opioids include: Codeine, Hydrocodone(Norco, Vicodin), Oxycodone(Percocet, oxycontin) and hydromorphone amongst others.  Long term and even short term use of opiods can cause: Increased pain response Dependence Constipation Depression Respiratory depression And more.  Withdrawal symptoms can include Flu like symptoms Nausea, vomiting And more Techniques to manage these symptoms Hydrate well Eat regular healthy meals Stay active Use relaxation techniques(deep breathing, meditating, yoga) Do Not substitute Alcohol to help with tapering If you have been on opioids for less than two weeks and do not have pain than it is ok to stop all together.  Plan to wean off of opioids This plan should start within one week post op of your joint replacement. Maintain the same interval or time between taking each dose  and first decrease the dose.  Cut the total daily intake of opioids by one tablet each day Next start to increase the time between doses. The last dose that should be eliminated is the evening dose.   MAKE SURE YOU:  Understand these instructions.  Get help right away if you are not doing well or get worse.    Thank you for letting us be a part of your medical care team.  It is a privilege we respect greatly.  We hope these instructions will help you stay on track for a fast and full recovery!      Dental Antibiotics:  In most cases prophylactic antibiotics for Dental procdeures after total joint surgery are not necessary.  Exceptions are as follows:  1. History of prior total joint infection  2. Severely immunocompromised (Organ Transplant, cancer chemotherapy, Rheumatoid biologic meds such as Humera)  3. Poorly controlled diabetes (A1C &gt; 8.0, blood glucose over 200)  If you have one of these conditions, contact your surgeon for an antibiotic prescription, prior to your dental procedure.

## 2024-01-17 ENCOUNTER — Telehealth: Payer: Self-pay | Admitting: Orthopedic Surgery

## 2024-01-17 NOTE — Telephone Encounter (Signed)
 Soni from [facility] is asking for verbal orders for physical therapy 3 times a week for 2 weeks.  Call back # is [phone].

## 2024-01-18 NOTE — Telephone Encounter (Signed)
 Verbal order left on VM

## 2024-01-20 ENCOUNTER — Telehealth: Payer: Self-pay

## 2024-01-20 NOTE — Telephone Encounter (Signed)
 [facility] nurse called stating that patient has increased swelling and bruising in her left knee.  Stated that patient has been icing, taking medication, and elevating.  Stated that patient's daughter Waynetta Sandy is concerned about the swelling that has gotten worse.  Would like to know if patient needs to be seen.  CB# for Waynetta Sandy is [phone], patients# [phone]. CB# for [facility] nurse is [phone].  Please advise.

## 2024-01-20 NOTE — Telephone Encounter (Signed)
Patient aware of the below message from [facility]  

## 2024-01-24 ENCOUNTER — Other Ambulatory Visit: Payer: Self-pay | Admitting: Orthopaedic Surgery

## 2024-01-24 ENCOUNTER — Other Ambulatory Visit (HOSPITAL_COMMUNITY): Payer: Self-pay

## 2024-01-24 MED ORDER — TIZANIDINE HCL 2 MG PO TABS
2.0000 mg | ORAL_TABLET | Freq: Four times a day (QID) | ORAL | 0 refills | Status: AC | PRN
  Filled 2024-01-24: qty 30, 8d supply, fill #0

## 2024-01-27 ENCOUNTER — Other Ambulatory Visit (HOSPITAL_COMMUNITY): Payer: Self-pay

## 2024-01-27 ENCOUNTER — Encounter: Payer: Self-pay | Admitting: Orthopaedic Surgery

## 2024-01-27 ENCOUNTER — Ambulatory Visit (INDEPENDENT_AMBULATORY_CARE_PROVIDER_SITE_OTHER): Payer: Medicare Other | Admitting: Orthopaedic Surgery

## 2024-01-27 DIAGNOSIS — Z96652 Presence of left artificial knee joint: Secondary | ICD-10-CM

## 2024-01-27 MED ORDER — ONDANSETRON 4 MG PO TBDP: 4.0000 mg | ORAL_TABLET | Freq: Three times a day (TID) | ORAL | 0 refills | Status: AC | PRN

## 2024-01-27 MED ORDER — HYDROMORPHONE HCL 4 MG PO TABS: 4.0000 mg | ORAL_TABLET | ORAL | 0 refills | Status: DC | PRN

## 2024-01-27 NOTE — Progress Notes (Signed)
 The patient is an 83 year old female who is here for her first postoperative visit status post a left total knee replacement.  She states she is really sore but is doing well.  She is ambulating with a walker.  She is ready to transition to outpatient physical therapy.  She lives in [place] and would like to try [facility] which I agree with.  Her left knee looks good.  The staples are removed and Steri-Strips applied.  Her extension is almost full and I can flex her to 90 degrees.  Her calf is soft.  She is also not wearing compressive hose.  She does not tolerate medications very well but does need a refill on Dilaudid as well as Zofran.  She can stop her baby aspirin.  I will send in a pain medication prescriptions.  We will then see her back in 4 weeks for repeat exam to look at range of motion of her knee but no x-rays are needed.

## 2024-02-02 ENCOUNTER — Other Ambulatory Visit: Payer: Self-pay

## 2024-02-02 ENCOUNTER — Telehealth: Payer: Self-pay | Admitting: Orthopaedic Surgery

## 2024-02-02 DIAGNOSIS — Z96652 Presence of left artificial knee joint: Secondary | ICD-10-CM

## 2024-02-02 NOTE — Telephone Encounter (Signed)
 Sent to referral pool for PT

## 2024-02-02 NOTE — Telephone Encounter (Signed)
 Pt called requesting to resend referral to [facility]. Pt phone number is [phone].

## 2024-02-03 ENCOUNTER — Telehealth: Payer: Self-pay | Admitting: Orthopaedic Surgery

## 2024-02-03 NOTE — Telephone Encounter (Signed)
 faxed

## 2024-02-03 NOTE — Telephone Encounter (Signed)
 Pt called again stating [facility] still has not  received referral for pt to start physical therapy. Please refax referral to [phone].

## 2024-02-23 ENCOUNTER — Other Ambulatory Visit (HOSPITAL_COMMUNITY): Payer: Self-pay

## 2024-02-23 NOTE — Progress Notes (Signed)
 Radiation Oncology         [phone] ________________________________  Name: Kristin Carlson MRN: [id number]  Date: 02/24/2024  DOB: [birth year]  Follow-Up Visit Note  CC: Kristin Leatherwood, FNP  Kristin Fila, MD  No diagnosis found.  Diagnosis:  The encounter diagnosis was Endometrial cancer (HCC).   Stage III (pT3, pN0, cM0) endometrial cancer, high-grade serous   New diagnosis of Left Breast UOQ, Invasive Mammary Carcinoma with DCIS, ER+ / PR+ / Her2-, Grade 2 : s/p lumpectomy    Interval Since Last Radiation: 1 year, 7 months, and 12 days    Intent: Curative  Radiation Treatment Dates: 12/18/2021 through 01/13/2022 Site Technique Total Dose (Gy) Dose per Fx (Gy) Completed Fx Beam Energies  Vagina: Pelvis HDR-brachy 30/30 6 5/5 Ir-192    Narrative:  The patient returns today for routine follow-up. She was last seen in office on 08-25-24 for a routine follow up. Patient continued to follow up with her specialists to manage her chronic conditions.   Since then, she continued to follow with Kristin Carlson and Kristin Carlson for surveillance of her disease. During a follow up with Kristin Carlson on 11-11-23 she reported overall doing very well and is NED on exam.            She underwent a CT a/p on 12-03-23 showing no findings of metastatic disease in the abdomen or pelvis. Scan did however reveal new irregularity in the upper coccyx suggesting a transverse fracture and a mild presacral thickening in the vicinity of this presumed subacute fracture. A small hiatal hernia and diverticulosis in the sigmoid colon was also noted.   During most recent follow up with Kristin Carlson on 12-17-23. At that time, she has discontinued aromatase inhibitor due to side effects and is feeling better. No further significant complains were reported.   No other significant oncologic interval history since the patient was last seen.   Of note:  --she underwent a total left knee arthroplasty on 01-13-24.   --CT  cervical spine/head on 12/22/23 showed no CT evidence for acute intracranial abnormality. 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                   Allergies:  is allergic to codeine, lactose, penicillins, sulfa antibiotics, and vancomycin.  Meds: Current Outpatient Medications  Medication Sig Dispense Refill   acetaminophen (TYLENOL) 500 MG tablet Take 1,000 mg by mouth every 6 (six) hours as needed (pain.).     aspirin 81 MG chewable tablet Chew 1 tablet (81 mg total) by mouth 2 (two) times daily. 30 tablet 0   cholecalciferol (VITAMIN D3) 25 MCG (1000 UNIT) tablet Take 1,000 Units by mouth in the morning and at bedtime.     gabapentin (NEURONTIN) 100 MG capsule Take 100 mg by mouth at bedtime.     HYDROmorphone (DILAUDID) 4 MG tablet Take 1 tablet (4 mg total) by mouth every 4 (four) hours as needed for severe pain (pain score 7-10). 30 tablet 0   levothyroxine (SYNTHROID) 125 MCG tablet Take 125 mcg by mouth daily before breakfast.     loperamide (IMODIUM A-D) 2 MG tablet 1/2 tablet by mouth every other day (Patient taking differently: Take 1-2 mg by mouth every other day as needed for diarrhea or loose stools.) 45 tablet 3   losartan (COZAAR) 100 MG tablet Take 100 mg by mouth in the morning.     Magnesium Bisglycinate (MAG GLYCINATE PO)  Take 240 mg by mouth in the morning and at bedtime.     Menthol, Topical Analgesic, (BIOFREEZE EX) Apply 1 Application topically 3 (three) times daily as needed (pain.).     Multiple Vitamin (MULTIVITAMIN WITH MINERALS) TABS tablet Take 1 tablet by mouth in the morning. Centrum for Adults 50+     Omega-3 1000 MG CAPS Take 1,000-2,000 mg by mouth See admin instructions. Take 2 capsules by mouth in the morning & take 1 capsule by mouth at night.     ondansetron (ZOFRAN-ODT) 4 MG disintegrating tablet Take 1 tablet (4 mg total) by mouth every 8 (eight) hours as needed for nausea or vomiting. 20 tablet 0   pantoprazole (PROTONIX) 20  MG tablet Take 20 mg by mouth daily as needed for heartburn or indigestion.     Probiotic Product (PROBIOTIC PO) Take 1 capsule by mouth in the morning.     propranolol (INDERAL) 40 MG tablet Take 40-80 mg by mouth See admin instructions. Take 2 tablets (80 mg) by mouth in the morning & take 1 tablet (40 mg) by mouth in the evening.     rosuvastatin (CRESTOR) 5 MG tablet Take 5 mg by mouth at bedtime.     Simethicone 125 MG TABS Take 250 mg by mouth 2 (two) times daily as needed (gas).     temazepam (RESTORIL) 7.5 MG capsule Take 15 mg by mouth at bedtime as needed for sleep.     tiZANidine (ZANAFLEX) 2 MG tablet Take 1 tablet (2 mg total) by mouth every 6 (six) hours as needed. 30 tablet 0   venlafaxine XR (EFFEXOR-XR) 75 MG 24 hr capsule Take 75 mg by mouth every evening.     No current facility-administered medications for this encounter.    Physical Findings: The patient is in no acute distress. Patient is alert and oriented.  vitals were not taken for this visit. .  No significant changes. Lungs are clear to auscultation bilaterally. Heart has regular rate and rhythm. No palpable cervical, supraclavicular, or axillary adenopathy. Abdomen soft, non-tender, normal bowel sounds.   Lab Findings: Lab Results  Component Value Date   WBC 9.9 01/14/2024   HGB 11.1 (L) 01/14/2024   HCT 34.8 (L) 01/14/2024   MCV 94.6 01/14/2024   PLT 164 01/14/2024    Radiographic Findings: No results found.  Impression: Stage III (pT3, pN0, cM0) endometrial cancer, high-grade serous   New diagnosis of Left Breast UOQ, Invasive Mammary Carcinoma with DCIS, ER+ / PR+ / Her2-, Grade 2 : s/p lumpectomy   The patient is recovering from the effects of radiation.  ***  Plan:  ***   *** minutes of total time was spent for this patient encounter, including preparation, face-to-face counseling with the patient and coordination of care, physical exam, and documentation of the  encounter. ____________________________________  Kristin Lade, PhD, MD  This document serves as a record of services personally performed by Kristin Blackbird, MD. It was created on his behalf by Kristin Carlson, a trained medical scribe. The creation of this record is based on the scribe's personal observations and the provider's statements to them. This document has been checked and approved by the attending provider.

## 2024-02-24 ENCOUNTER — Encounter: Payer: Self-pay | Admitting: Orthopaedic Surgery

## 2024-02-24 ENCOUNTER — Ambulatory Visit (INDEPENDENT_AMBULATORY_CARE_PROVIDER_SITE_OTHER): Payer: Medicare Other | Admitting: Orthopaedic Surgery

## 2024-02-24 ENCOUNTER — Encounter: Payer: Self-pay | Admitting: Radiation Oncology

## 2024-02-24 ENCOUNTER — Ambulatory Visit
Admission: RE | Admit: 2024-02-24 | Discharge: 2024-02-24 | Disposition: A | Discharge status: Home / Self Care | Payer: Self-pay | Source: Ambulatory Visit | Attending: Radiation Oncology | Admitting: Radiation Oncology

## 2024-02-24 VITALS — BP 157/91 | HR 67 | Temp 97.8°F | Resp 18 | Ht 63.0 in | Wt 191.2 lb

## 2024-02-24 DIAGNOSIS — Z8542 Personal history of malignant neoplasm of other parts of uterus: Secondary | ICD-10-CM | POA: Diagnosis present

## 2024-02-24 DIAGNOSIS — Z96652 Presence of left artificial knee joint: Secondary | ICD-10-CM

## 2024-02-24 DIAGNOSIS — Z923 Personal history of irradiation: Secondary | ICD-10-CM | POA: Diagnosis not present

## 2024-02-24 DIAGNOSIS — Z79899 Other long term (current) drug therapy: Secondary | ICD-10-CM | POA: Diagnosis not present

## 2024-02-24 DIAGNOSIS — Z79890 Hormone replacement therapy (postmenopausal): Secondary | ICD-10-CM | POA: Insufficient documentation

## 2024-02-24 DIAGNOSIS — C541 Malignant neoplasm of endometrium: Secondary | ICD-10-CM

## 2024-02-24 DIAGNOSIS — Z853 Personal history of malignant neoplasm of breast: Secondary | ICD-10-CM | POA: Insufficient documentation

## 2024-02-24 DIAGNOSIS — M25561 Pain in right knee: Secondary | ICD-10-CM

## 2024-02-24 NOTE — Progress Notes (Signed)
 Patient is now 6 weeks status post a left total knee arthroplasty.  She is working on range of motion and strength and physical therapy in [place] at [facility].  She is 83 years old and active.  She is not walking with any assistive device.  She does report right knee pain that really flared up yesterday.  Her left knee looks good today.  There is redness around the incision but when I elevate her knee that goes away.  She does have chronic lymphedema and wears compressive hose on that side.  Her right knee does have some pain throughout the arc of motion with no effusion today.  Her range of motion of her left operative knee is excellent.  I did recommend a steroid injection for her right knee today because I think this will help her.  She wants to continue with therapy mainly for balance and coordination.  I agree with this.  We did place a steroid injection in her right knee today without difficulty.  Will see her back in a month to see how she is doing overall and I would like a standing AP and lateral of her left knee at that visit.

## 2024-02-24 NOTE — Progress Notes (Signed)
 SHANI FITCH is here today for follow up post radiation to the pelvic.  They completed their radiation on: 01/13/22   Does the patient complain of any of the following:  Pain: No Abdominal bloating: No Diarrhea/Constipation: No Nausea/Vomiting: Nausea at times since knee surgery.  Vaginal Discharge: no Blood in Urine or Stool: No Urinary Issues (dysuria/incomplete emptying/ incontinence/ increased frequency/urgency): No Does patient report using vaginal dilator 2-3 times a week and/or sexually active 2-3 weeks: No  Post radiation skin changes: No   Additional comments if applicable: Reports decreased appetite and fluid intake.   BP (!) 157/91 (BP Location: Left Arm, Patient Position: Sitting, Cuff Size: Normal)   Pulse 67   Temp 97.8 F (36.6 C)   Resp 18   Ht 5\' 3"  (1.6 m)   Wt 191 lb 3.2 oz (86.7 kg)   SpO2 99%   BMI 33.87 kg/m

## 2024-02-29 IMAGING — US US BREAST*L* LIMITED INC AXILLA
1 series · 11 of 11 positions shown · non-contrast
Comparison: Previous exam(s).

CLINICAL DATA: Patient has undergone chemotherapy treatment for
endometrial carcinoma. She was diagnosed with left breast invasive
mammary carcinoma on ultrasound-guided core needle biopsy on
11/27/2021. She has not undergone excision of this lesion. Current
exam is to assess the breast cancer response to the chemotherapy for
endometrial carcinoma.

EXAM:
DIGITAL DIAGNOSTIC UNILATERAL LEFT MAMMOGRAM WITH TOMOSYNTHESIS AND
CAD; ULTRASOUND LEFT BREAST LIMITED
TECHNIQUE: Left digital diagnostic mammography and breast tomosynthesis was
performed. The images were evaluated with computer-aided detection.;
Targeted ultrasound examination of the left breast was performed.

[Series 1: us breast*left* limited inc axilla · 0.06mm/px · 11 of 11 slices shown]
[im 1/11]
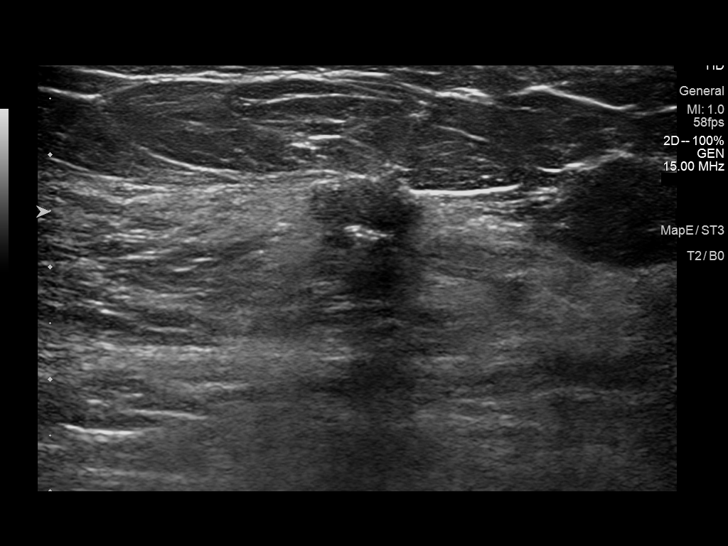
[im 2/11]
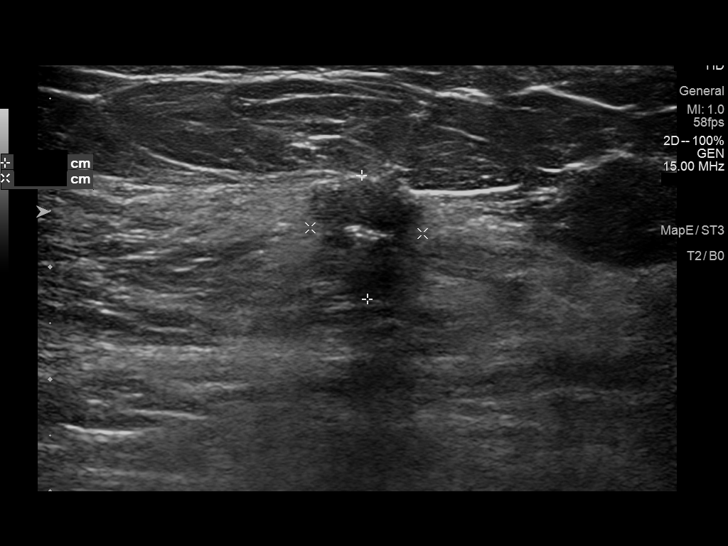
[im 3/11]
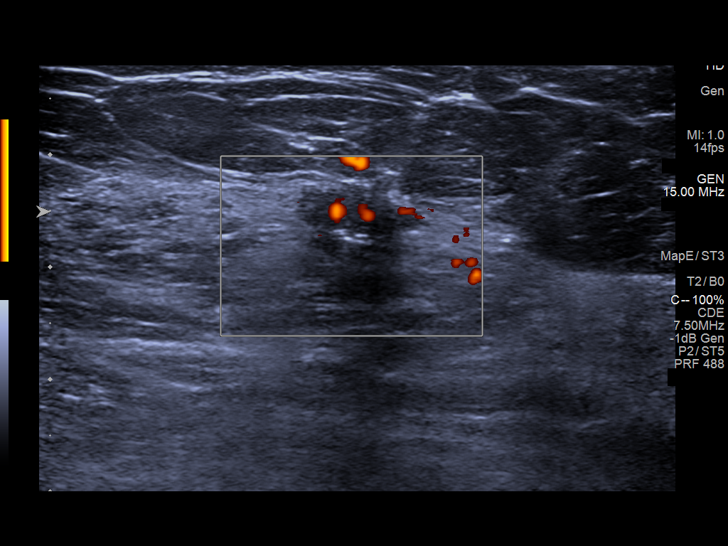
[im 4/11]
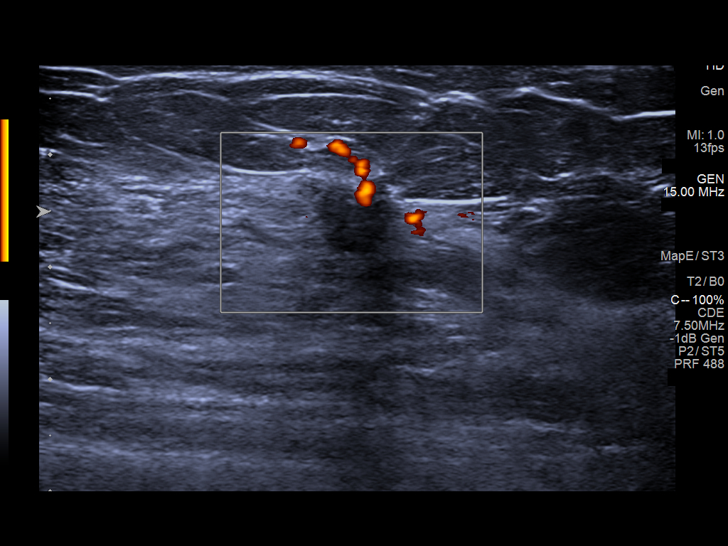
[im 5/11]
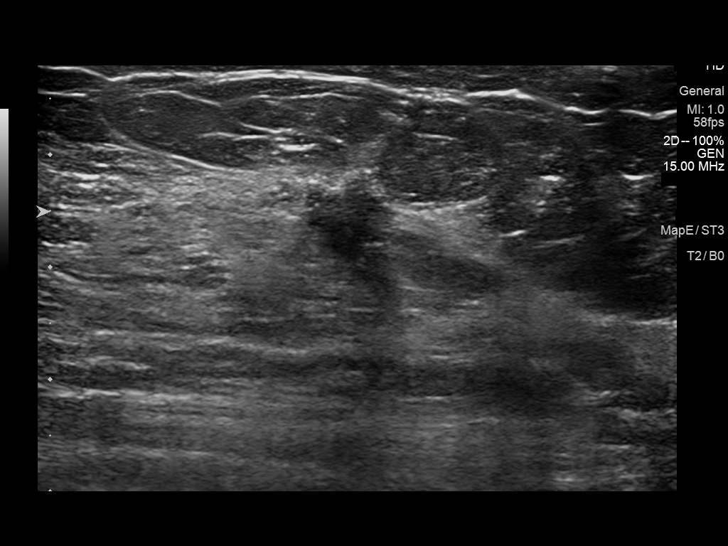
[im 6/11]
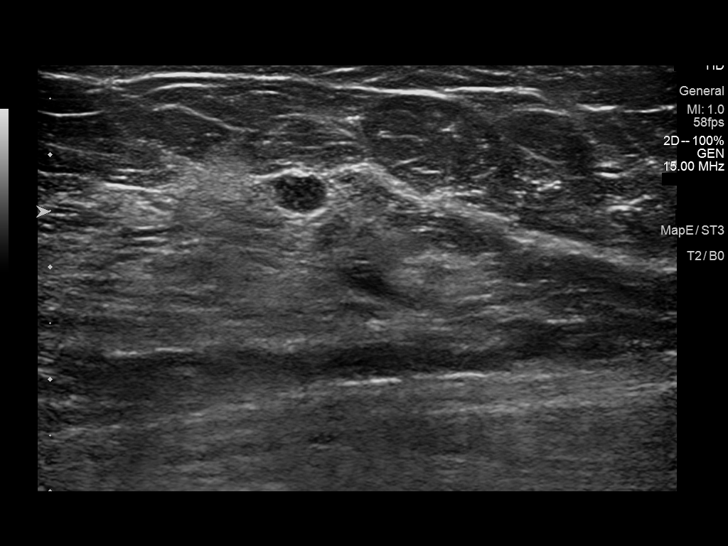
[im 7/11]
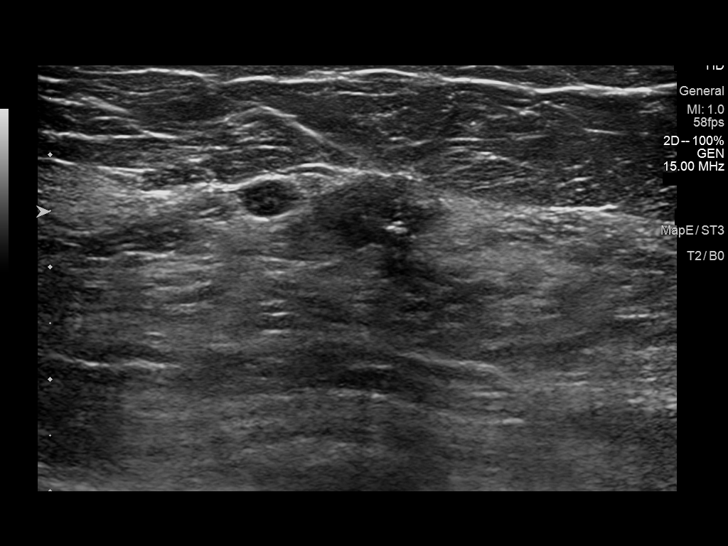
[im 8/11]
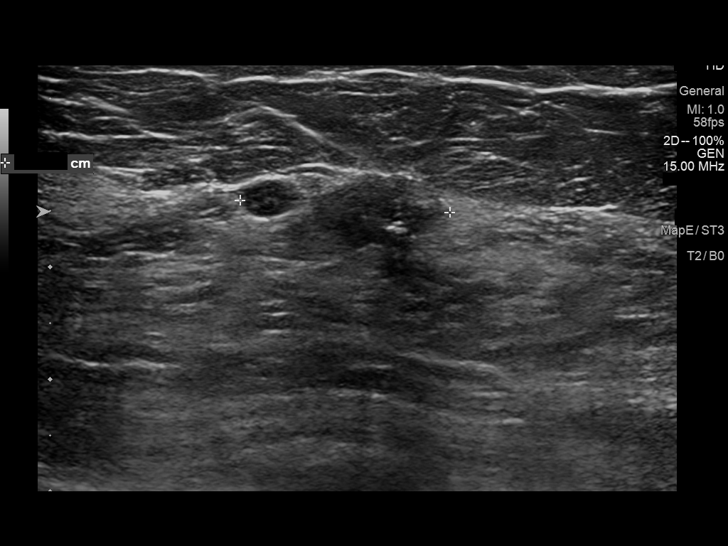
[im 9/11]
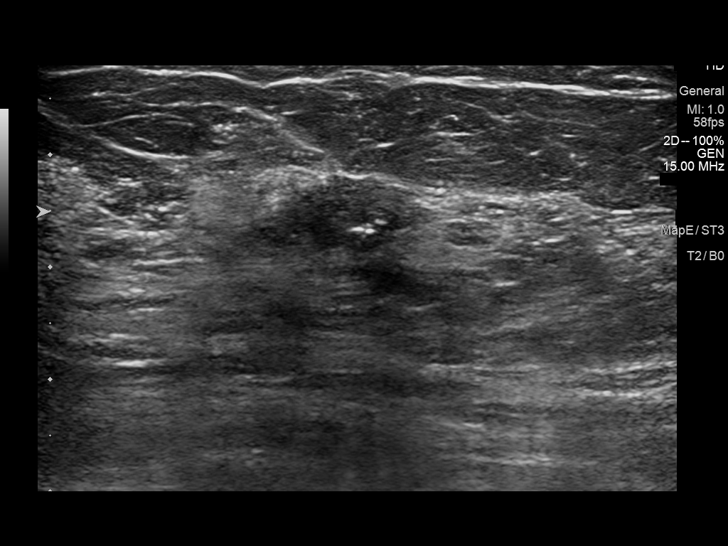
[im 10/11]
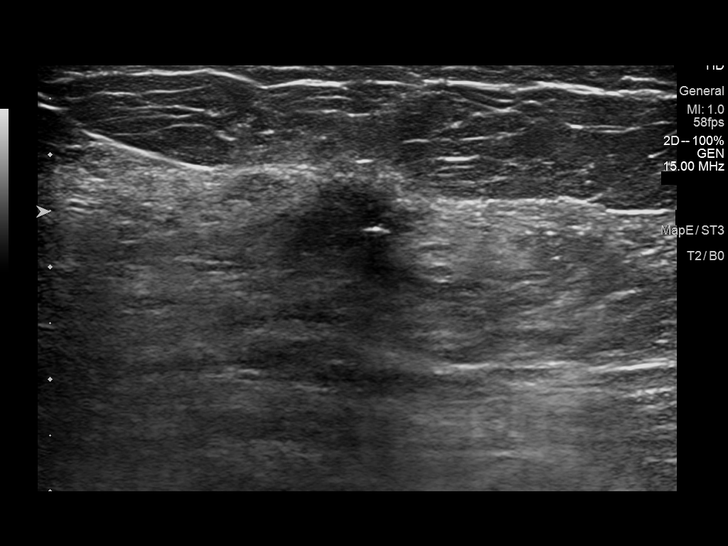
[im 11/11]
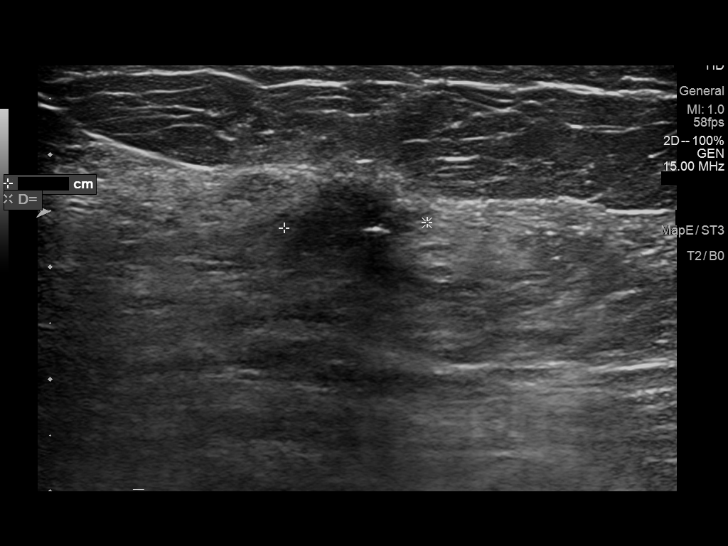

[11 of 11 positions shown; findings below may reference images not displayed]

ACR Breast Density Category c: The breast tissue is heterogeneously
dense, which may obscure small masses.
FINDINGS: The small irregular mass and associated architectural distortion,
localized with the venous shaped post biopsy marking clip, is
without significant change from the pre biopsy exam. There are no
new masses or new areas of distortion. There are no suspicious
calcifications.

Targeted ultrasound is performed, showing irregular mass in the left
breast at 1 o'clock, 6 cm the nipple, currently measuring 1.9 x
x 1.1 cm, previously 1.9 x 1.2 x 1.7 cm.
IMPRESSION: 1. Slight interval reduction in the size of the left breast
malignancy, following chemotherapy treatment for endometrial
carcinoma.

RECOMMENDATION:
Treatment as planned for the known left breast malignancy.

I have discussed the findings and recommendations with the patient.
If applicable, a reminder letter will be sent to the patient
regarding the next appointment.

BI-RADS CATEGORY  6: Known biopsy-proven malignancy.

## 2024-02-29 IMAGING — MG MM DIGITAL DIAGNOSTIC UNILAT*L* W/ TOMO W/ CAD
4 series · 4 of 12 positions shown · non-contrast
Comparison: Previous exam(s).

CLINICAL DATA: Patient has undergone chemotherapy treatment for
endometrial carcinoma. She was diagnosed with left breast invasive
mammary carcinoma on ultrasound-guided core needle biopsy on
11/27/2021. She has not undergone excision of this lesion. Current
exam is to assess the breast cancer response to the chemotherapy for
endometrial carcinoma.

EXAM:
DIGITAL DIAGNOSTIC UNILATERAL LEFT MAMMOGRAM WITH TOMOSYNTHESIS AND
CAD; ULTRASOUND LEFT BREAST LIMITED
TECHNIQUE: Left digital diagnostic mammography and breast tomosynthesis was
performed. The images were evaluated with computer-aided detection.;
Targeted ultrasound examination of the left breast was performed.

[L MLO synth-2D]
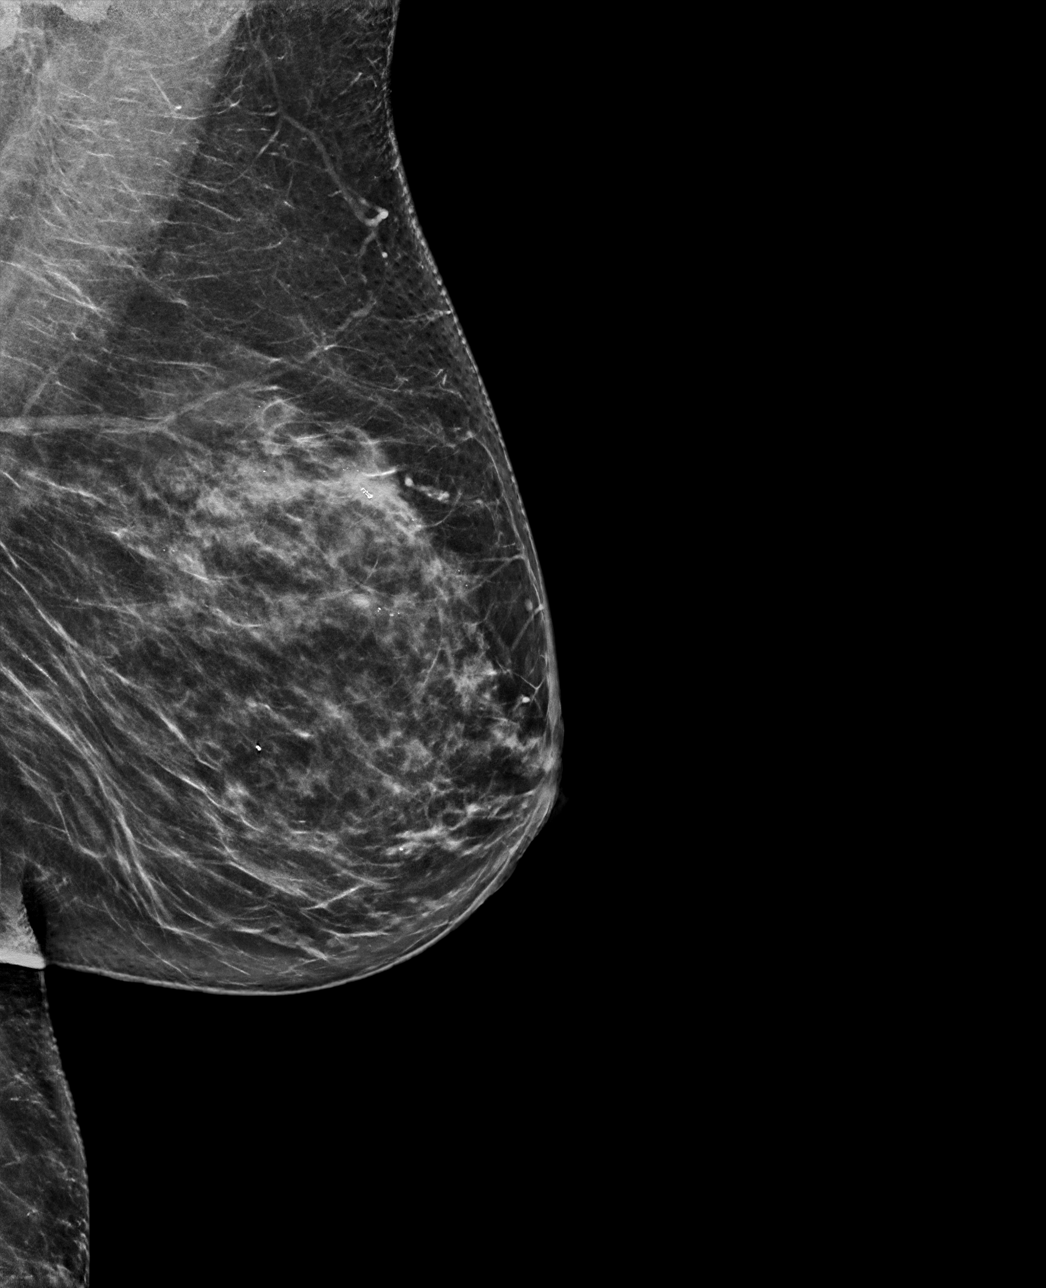

[L CC synth-2D]
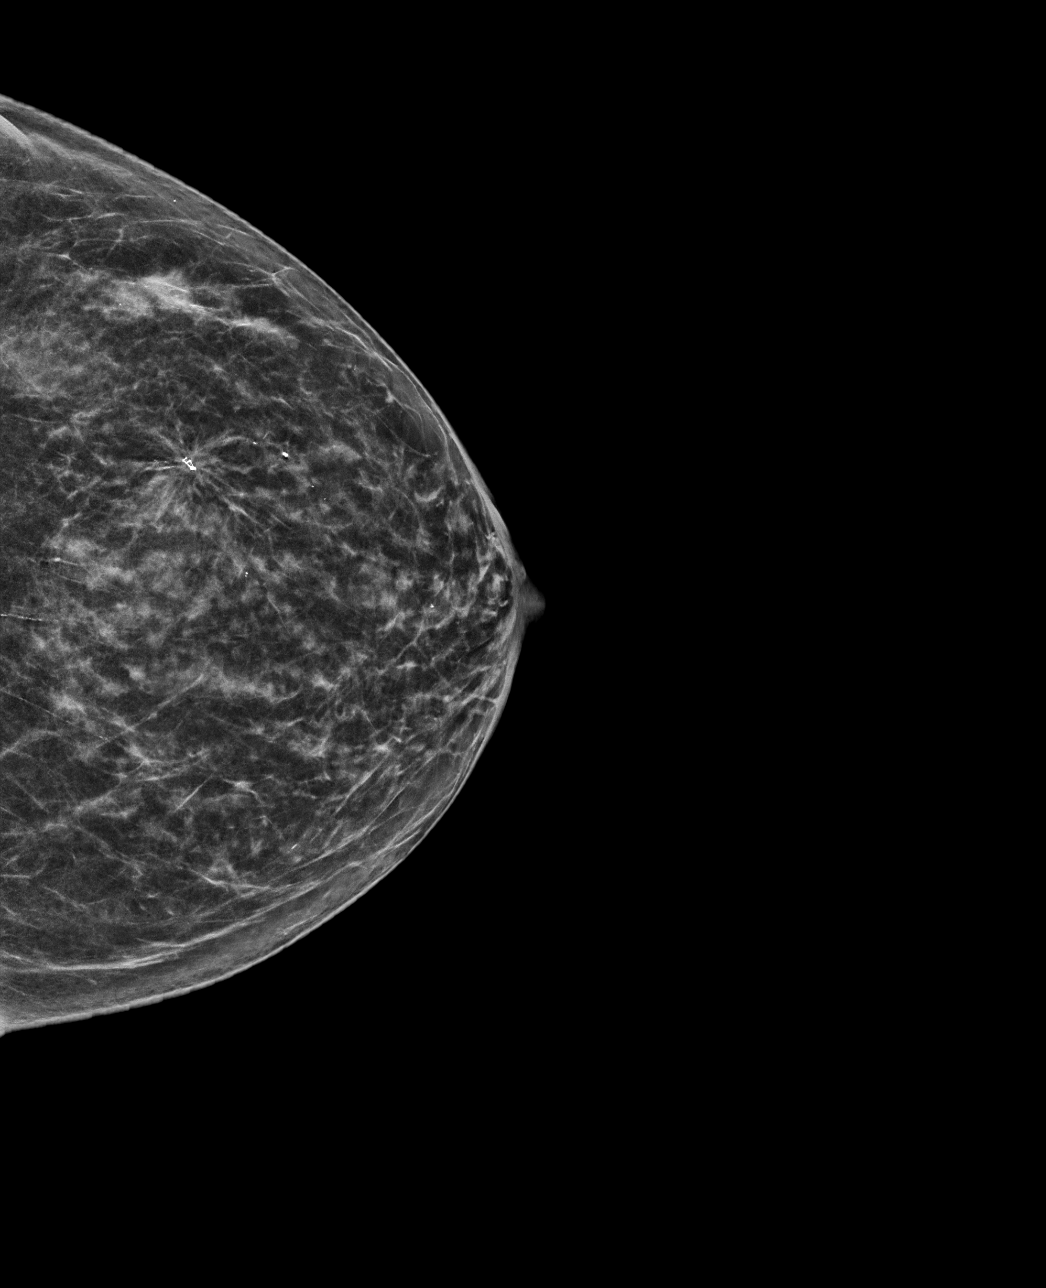

[L MLO tomo · tomo slice 39/78.0]
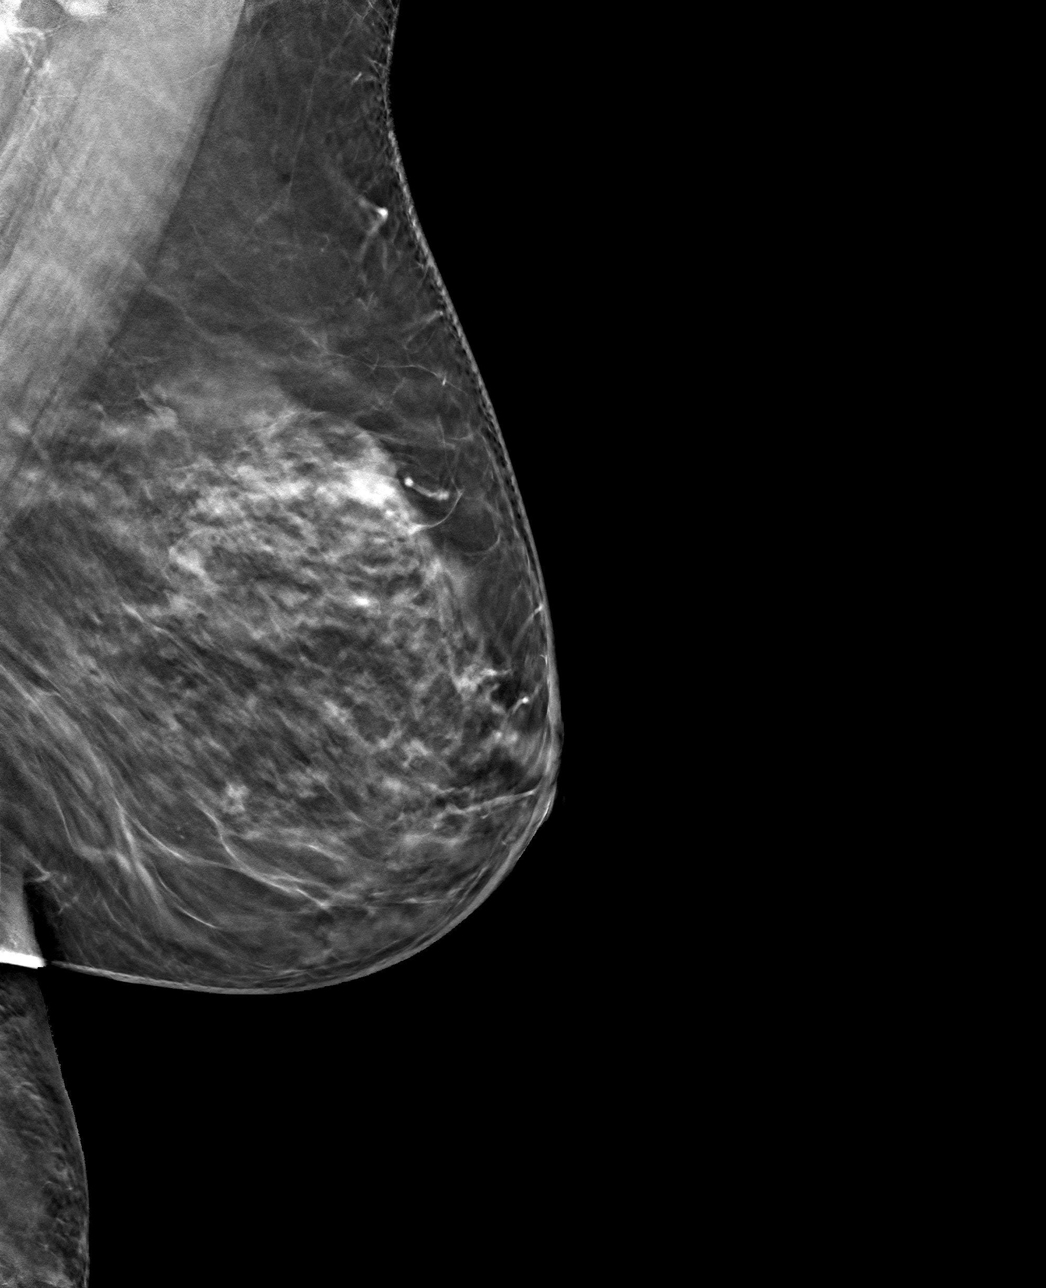

[L CC tomo · tomo slice 35/69.0]
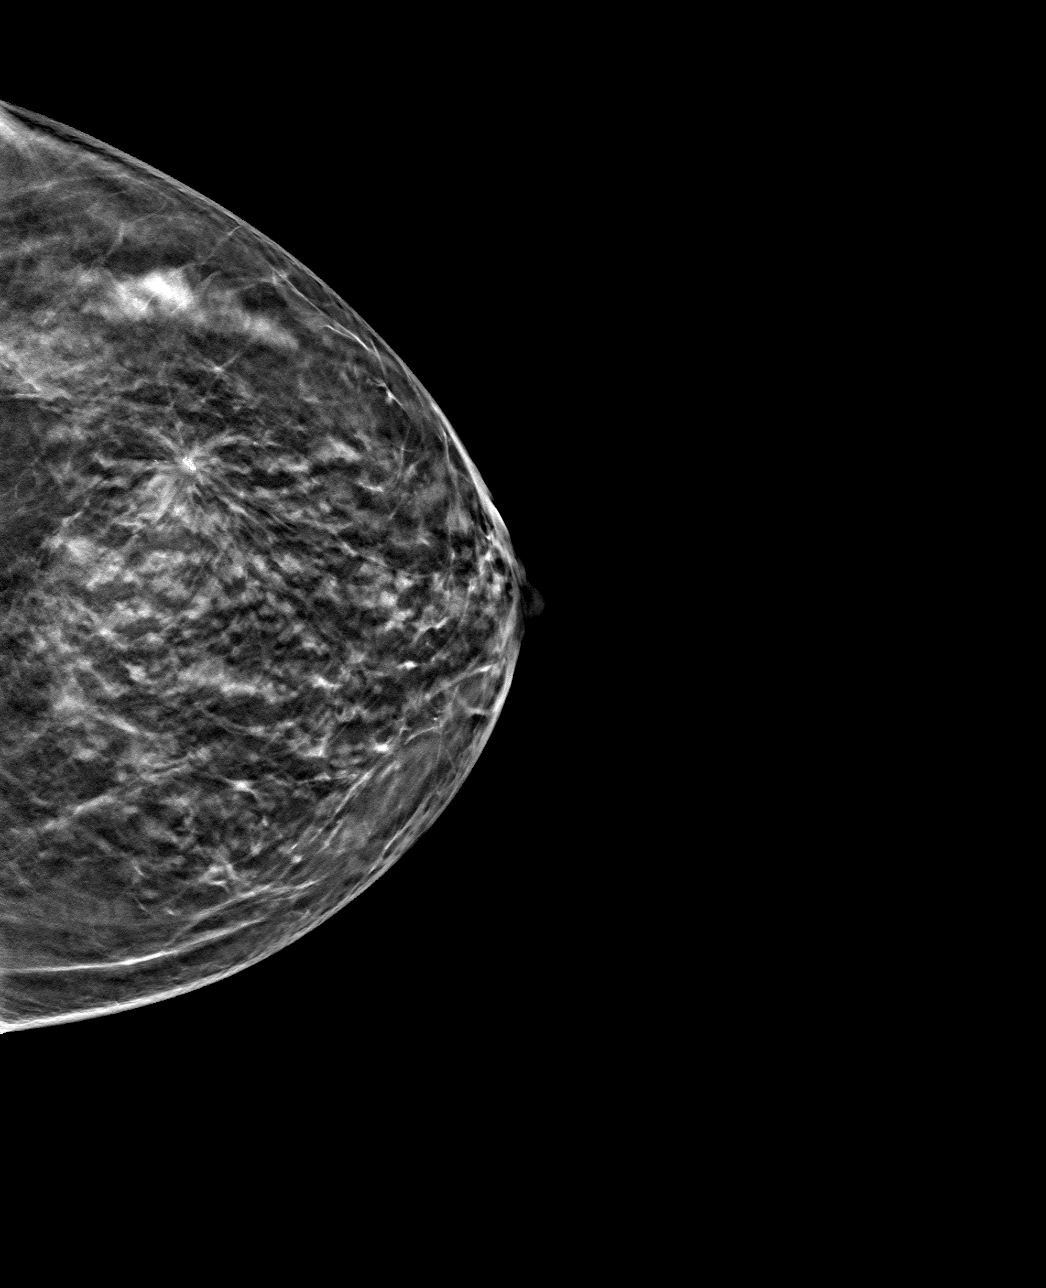

[4 of 12 positions shown; findings below may reference images not displayed]

ACR Breast Density Category c: The breast tissue is heterogeneously
dense, which may obscure small masses.
FINDINGS: The small irregular mass and associated architectural distortion,
localized with the venous shaped post biopsy marking clip, is
without significant change from the pre biopsy exam. There are no
new masses or new areas of distortion. There are no suspicious
calcifications.

Targeted ultrasound is performed, showing irregular mass in the left
breast at 1 o'clock, 6 cm the nipple, currently measuring 1.9 x
x 1.1 cm, previously 1.9 x 1.2 x 1.7 cm.
IMPRESSION: 1. Slight interval reduction in the size of the left breast
malignancy, following chemotherapy treatment for endometrial
carcinoma.

RECOMMENDATION:
Treatment as planned for the known left breast malignancy.

I have discussed the findings and recommendations with the patient.
If applicable, a reminder letter will be sent to the patient
regarding the next appointment.

BI-RADS CATEGORY  6: Known biopsy-proven malignancy.

## 2024-03-07 IMAGING — CT CT CHEST-ABD-PELV W/ CM
2 of 6 series · 9 of 36 positions shown, 14 images · IV contrast (agent unspecified)
Comparison: Chest abdomen pelvis CT 09/29/2021

CLINICAL DATA: Uterine/cervical cancer. Restaging. History of
breast cancer. * Tracking Code: BO *

EXAM:
CT CHEST, ABDOMEN, AND PELVIS WITH CONTRAST
TECHNIQUE: Multidetector CT imaging of the chest, abdomen and pelvis was
performed following the standard protocol during bolus
administration of intravenous contrast.

[Series 2: axials cap 5.00 · axial · 0.81mm/px · z∈[-1461,-986]mm · 6 of 135 slices shown, 11 images]
[im 20/135  mediastinal]
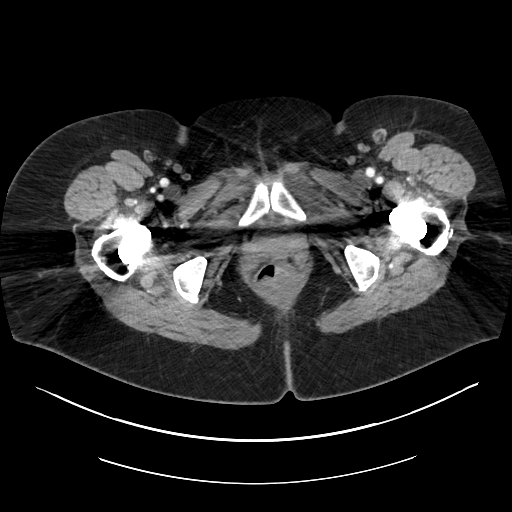
[im 20/135  bone]
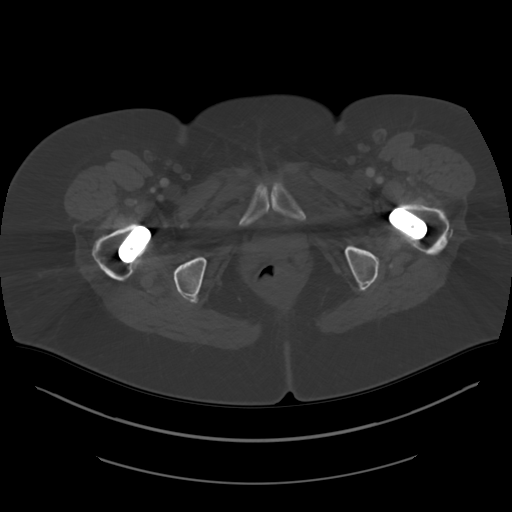
[im 39/135  mediastinal]
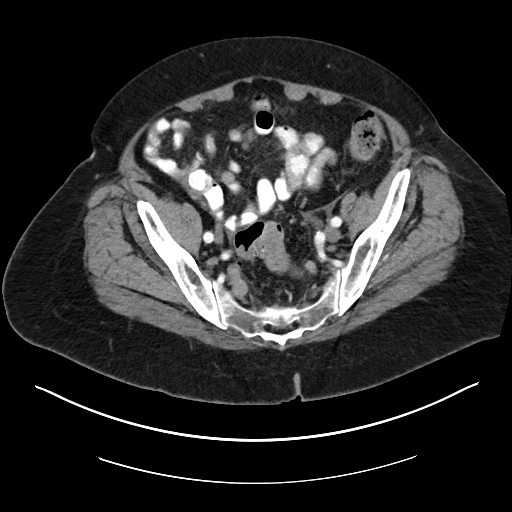
[im 58/135  mediastinal]
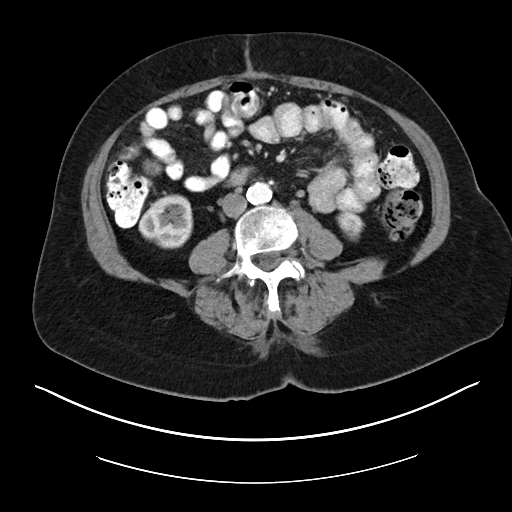
[im 58/135  lung]
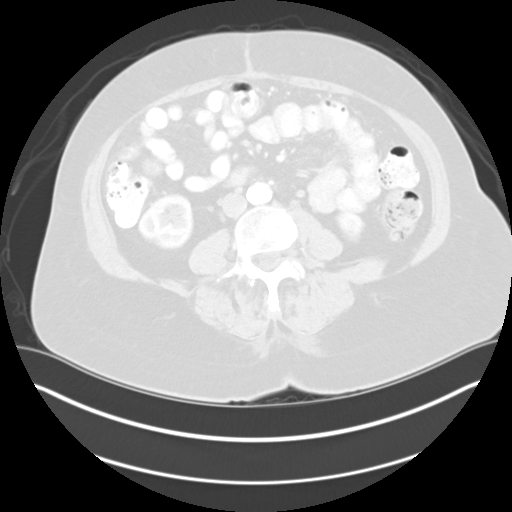
[im 77/135  mediastinal]
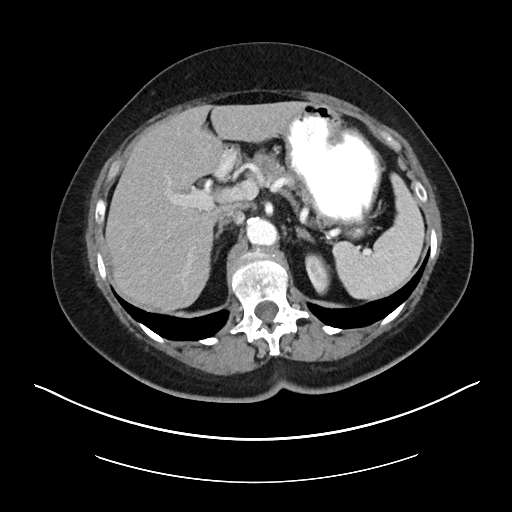
[im 77/135  lung]
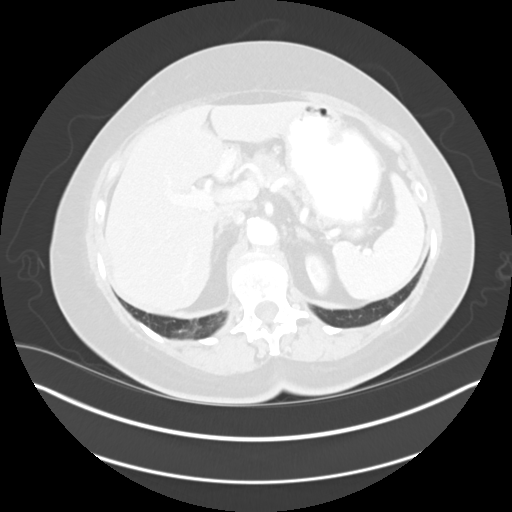
[im 96/135  mediastinal]
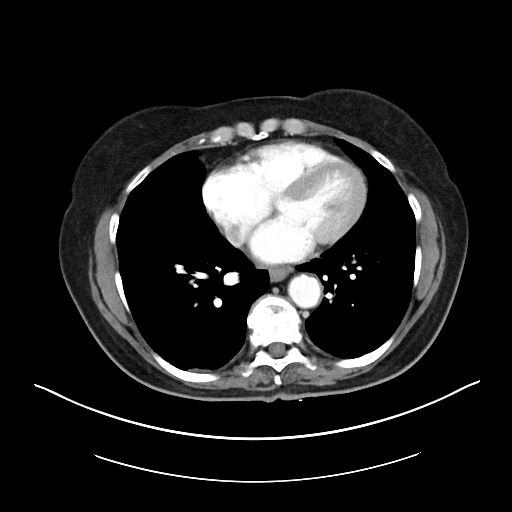
[im 96/135  lung]
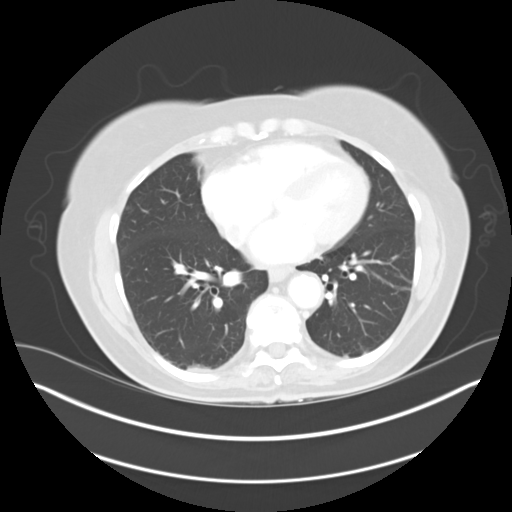
[im 115/135  mediastinal]
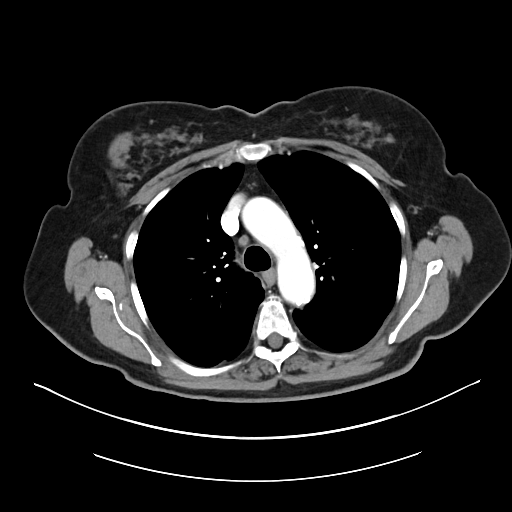
[im 115/135  lung]
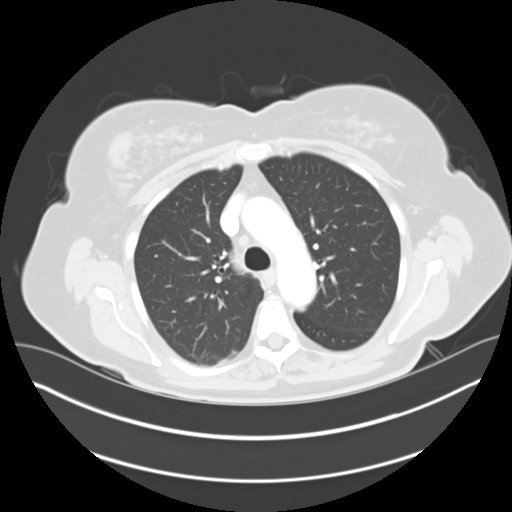

[Series 5: coronals cap 2.00 cor · coronal · 0.81mm/px · 3 of 156 slices shown]
[im 32/156  mediastinal]
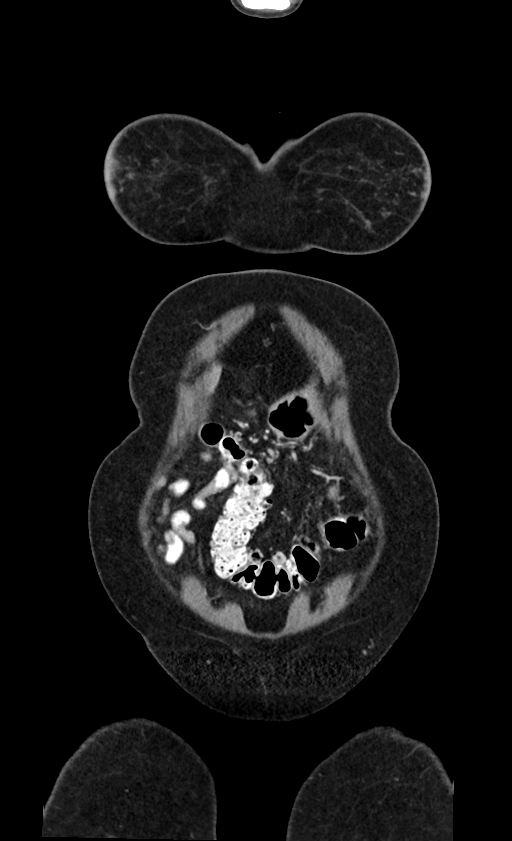
[im 63/156  mediastinal]
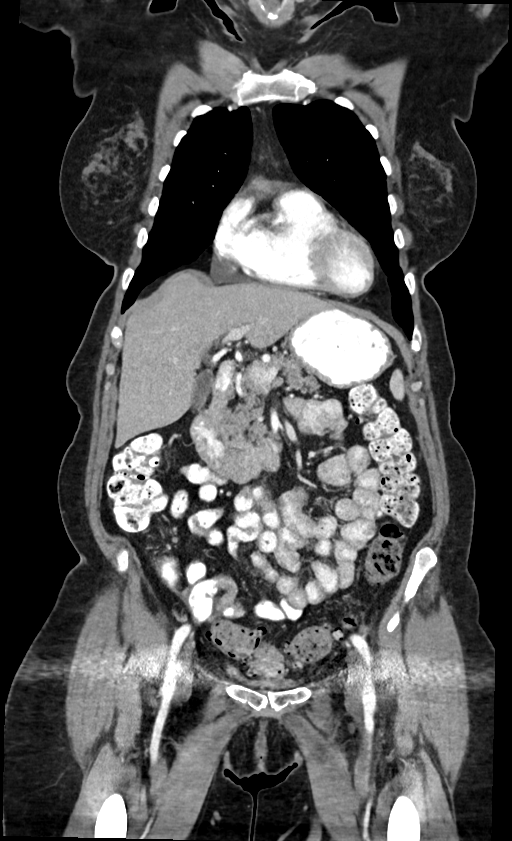
[im 94/156  mediastinal]
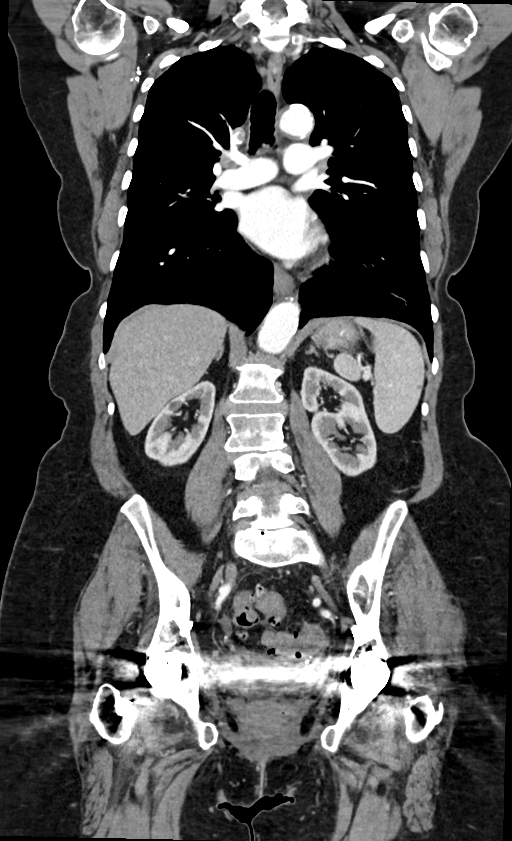

[9 of 36 positions shown; findings below may reference images not displayed]

RADIATION DOSE REDUCTION: This exam was performed according to the
departmental dose-optimization program which includes automated
exposure control, adjustment of the mA and/or kV according to
patient size and/or use of iterative reconstruction technique.

CONTRAST:  100mL OMNIPAQUE IOHEXOL 300 MG/ML  SOLN
FINDINGS: CT CHEST FINDINGS

Cardiovascular: The heart size is normal. No substantial pericardial
effusion. Mild atherosclerotic calcification is noted in the wall of
the thoracic aorta.

Mediastinum/Nodes: No mediastinal lymphadenopathy. There is no hilar
lymphadenopathy. The esophagus has normal imaging features. There is
no axillary lymphadenopathy.

Lungs/Pleura: Bandlike opacity in the medial left lung apex is
progressive in the interval, now with nodular component measuring up
to 10 mm on image [DATE]. Similar appearance of biapical
pleuroparenchymal scarring. No other suspicious pulmonary nodule or
mass. Subsegmental atelectasis or linear scarring noted left lower
lobe. No pleural effusion.

Musculoskeletal: No worrisome lytic or sclerotic osseous
abnormality.

CT ABDOMEN PELVIS FINDINGS

Hepatobiliary: No suspicious focal abnormality within the liver
parenchyma. There is no evidence for gallstones, gallbladder wall
thickening, or pericholecystic fluid. No intrahepatic or
extrahepatic biliary dilation.

Pancreas: No focal mass lesion. No dilatation of the main duct. No
intraparenchymal cyst. No peripancreatic edema.

Spleen: No splenomegaly. No focal mass lesion.

Adrenals/Urinary Tract: No adrenal nodule or mass. Cortical scarring
noted both kidneys without suspicious enhancing renal mass lesion
evident. Both kidneys demonstrate duplicated intrarenal collecting
systems with at least partial duplication of both ureters. Distal
ureters and bladder obscured by beam hardening artifact from
bilateral hip replacement.

Stomach/Bowel: Stomach is unremarkable. No gastric wall thickening.
No evidence of outlet obstruction. Duodenum is normally positioned
as is the ligament of Treitz. No small bowel wall thickening. No
small bowel dilatation. The terminal ileum is normal. The appendix
is not well visualized, but there is no edema or inflammation in the
region of the cecum. No gross colonic mass. No colonic wall
thickening. Diverticular changes are noted in the left colon without
evidence of diverticulitis. Moderate stool volume throughout.

Vascular/Lymphatic: There is moderate atherosclerotic calcification
of the abdominal aorta without aneurysm. There is no gastrohepatic
or hepatoduodenal ligament lymphadenopathy. No retroperitoneal or
mesenteric lymphadenopathy. No pelvic sidewall lymphadenopathy.

Reproductive: Probable hysterectomy although inferior pelvis
obscured by beam hardening artifact.

Other: Trace free fluid seen in the upper pelvis.

Musculoskeletal: Status post bilateral hip replacement. No worrisome
lytic or sclerotic osseous abnormality.
IMPRESSION: 1. Bandlike opacity in the medial left lung apex is progressive in
the interval, now with nodular component measuring up to 10 mm.
While likely related to evolving atelectasis or scar, consider
follow-up CT chest in 3 months to reassess.
2. Interval hysterectomy.
3. No evidence for metastatic disease in the chest, abdomen, or
pelvis.
4. Trace free fluid in the upper pelvis, nonspecific. Of note,
inferior pelvis is essentially obscured by beam hardening artifact
from bilateral hip replacement.
5. Left colonic diverticulosis without diverticulitis.
6. Moderate stool volume. Imaging features could be compatible with
constipation in the appropriate clinical setting.
7. Aortic Atherosclerosis ([id number]).

## 2024-03-23 ENCOUNTER — Ambulatory Visit: Admitting: Orthopaedic Surgery

## 2024-03-23 ENCOUNTER — Other Ambulatory Visit (INDEPENDENT_AMBULATORY_CARE_PROVIDER_SITE_OTHER): Payer: Self-pay

## 2024-03-23 ENCOUNTER — Encounter: Payer: Self-pay | Admitting: Orthopaedic Surgery

## 2024-03-23 DIAGNOSIS — Z96652 Presence of left artificial knee joint: Secondary | ICD-10-CM | POA: Diagnosis not present

## 2024-03-23 NOTE — Progress Notes (Signed)
 The patient is an 83 year old female who actually to me looks younger than her stated age.  She is now about 10 weeks out from a left total knee arthroplasty and doing very well with that knee.  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  We did place a steroid recently in her right knee and that has helped some.  The knee does feel little bit better on the right side given the fact that her left knee is doing better.  She is really push herself hard through therapy and is incredibly motivated.  She does her a lot of credit for getting her knee bending and moving.  On exam today her left knee shows full extension and almost full flexion.  It feels limply stable.  2 views of the left knee show well-seated total knee arthroplasty with no complicating features.  The AP view also shows the right knee standing with there is varus malalignment and bone-on-bone wear the medial compartment.  From my standpoint for her left knee we do not need to see her back for 6 months unless there are issues.  Will have a standing AP and lateral of the left knee at that visit.  If her right knee starts getting worse she will let us  know.

## 2024-04-19 ENCOUNTER — Telehealth: Payer: Self-pay

## 2024-04-19 NOTE — Telephone Encounter (Signed)
 Patient called back and we rescheduled her appointment from 6/13 to 7/10 @ 3pm

## 2024-04-19 NOTE — Telephone Encounter (Signed)
 Left message for patient to call back and have appointment on 6/13 rescheduled.

## 2024-05-05 ENCOUNTER — Other Ambulatory Visit: Payer: Self-pay

## 2024-05-05 ENCOUNTER — Telehealth: Payer: Self-pay

## 2024-05-05 DIAGNOSIS — C50412 Malignant neoplasm of upper-outer quadrant of left female breast: Secondary | ICD-10-CM

## 2024-05-05 DIAGNOSIS — C541 Malignant neoplasm of endometrium: Secondary | ICD-10-CM

## 2024-05-05 NOTE — Telephone Encounter (Signed)
 Returned her call and left a message. Order for mammogram in chart now and ask her to call to schedule.

## 2024-05-08 ENCOUNTER — Ambulatory Visit
Admission: RE | Admit: 2024-05-08 | Discharge: 2024-05-08 | Disposition: A | Discharge status: Home / Self Care | Source: Ambulatory Visit | Attending: Hematology and Oncology | Admitting: Hematology and Oncology

## 2024-05-08 DIAGNOSIS — C50412 Malignant neoplasm of upper-outer quadrant of left female breast: Secondary | ICD-10-CM | POA: Diagnosis present

## 2024-05-08 DIAGNOSIS — C541 Malignant neoplasm of endometrium: Secondary | ICD-10-CM | POA: Diagnosis present

## 2024-05-12 ENCOUNTER — Ambulatory Visit: Payer: TRICARE For Life (TFL) | Admitting: Gynecologic Oncology

## 2024-06-07 ENCOUNTER — Encounter: Payer: Self-pay | Admitting: Gynecologic Oncology

## 2024-06-08 ENCOUNTER — Inpatient Hospital Stay: Attending: Gynecologic Oncology | Admitting: Gynecologic Oncology

## 2024-06-08 ENCOUNTER — Encounter: Payer: Self-pay | Admitting: Gynecologic Oncology

## 2024-06-08 VITALS — BP 144/80 | HR 98 | Temp 98.0°F | Resp 20 | Wt 190.6 lb

## 2024-06-08 DIAGNOSIS — Z923 Personal history of irradiation: Secondary | ICD-10-CM | POA: Insufficient documentation

## 2024-06-08 DIAGNOSIS — Z9079 Acquired absence of other genital organ(s): Secondary | ICD-10-CM | POA: Diagnosis not present

## 2024-06-08 DIAGNOSIS — Z8542 Personal history of malignant neoplasm of other parts of uterus: Secondary | ICD-10-CM | POA: Diagnosis present

## 2024-06-08 DIAGNOSIS — C541 Malignant neoplasm of endometrium: Secondary | ICD-10-CM

## 2024-06-08 DIAGNOSIS — N3941 Urge incontinence: Secondary | ICD-10-CM

## 2024-06-08 DIAGNOSIS — Z90710 Acquired absence of both cervix and uterus: Secondary | ICD-10-CM | POA: Diagnosis not present

## 2024-06-08 DIAGNOSIS — C50412 Malignant neoplasm of upper-outer quadrant of left female breast: Secondary | ICD-10-CM | POA: Diagnosis not present

## 2024-06-08 DIAGNOSIS — Z90722 Acquired absence of ovaries, bilateral: Secondary | ICD-10-CM | POA: Insufficient documentation

## 2024-06-08 DIAGNOSIS — Z9221 Personal history of antineoplastic chemotherapy: Secondary | ICD-10-CM | POA: Diagnosis not present

## 2024-06-08 DIAGNOSIS — R339 Retention of urine, unspecified: Secondary | ICD-10-CM

## 2024-06-08 NOTE — Progress Notes (Signed)
 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 Evaluation of the pelvis is significantly limited by dense metallic streak artifact from adjacent hip arthroplasty. Within this limitation, there is expansile, masslike, heterogeneously enhancing appearance of the endometrium, measuring at least 5.2 cm in thickness. Findings are in keeping with reported diagnosis of endometrial malignancy. 2. No evidence of lymphadenopathy or metastatic disease in the chest, abdomen, or pelvis. 3. There is a 0.4 cm fissural nodule the superior segment left lower lobe, almost certainly a benign intrapulmonary lymph node. Attention on follow-up. 4. Coronary artery disease.   10/14/2021 Surgery   TRH/BSO, right SLN biopsy, left pelvic and PA LND, peritoneal nodule biopsy, mini-lap for specimen delivery  Findings: On EUA, 8cm bulbous uterus. ON intra-abdominal entry, normal upper abdominal survey. Normal omentum, small and large bowel. Uterus 8-10cm and bulbous. Normal appearing adnexa. Mapping successful on the right, no mapping on the left. Some mildly prominent lymph nodes bilaterally in the pelvis. Small, <5 mm, nodule in the cul de sac. No obvious intra-abdominal or pelvic evidence of disease.   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 SENTINEL LYMPH NODE, RIGHT EXTERNAL ILIAC,  BIOPSY:  - Lymph node, negative for carcinoma (0/1)   B. SENTINEL LYMPH NODE, RIGHT OBTURATOR AND SURROUNDING LYMPH NODES,  BIOPSY:  - Lymph nodes, negative for carcinoma (0/5)   C. UTERUS, CERVIX, BILATERAL FALLOPIAN TUBES AND OVARIES:  - Invasive high-grade serous carcinoma  - Carcinoma focally invades into the serosal surface  - Lymphovascular invasion is present  - Benign unremarkable cervix  - Benign unremarkable bilateral fallopian tubes and ovaries  - See oncology table   D. CUL DE SAC NODULE, POSTERIOR, BIOPSY:  - Endometriosis   E. LYMPH NODE, LEFT PELVIC, BIOPSY:  - Lymph nodes, negative for carcinoma (0/3)   F. LYMPH NODE, LEFT PARAORTIC, BIOPSY:  - Lymph nodes, negative for carcinoma (0/5)   ONCOLOGY TABLE:   UTERUS, CARCINOMA OR CARCINOSARCOMA: Resection   Procedure: Total hysterectomy and bilateral salpingo-oophorectomy  Histologic Type: Serous carcinoma  Histologic Grade: High-grade  Myometrial Invasion:       Depth of Myometrial Invasion (mm): 32 mm       Myometrial Thickness (mm): 32 mm       Percentage of Myometrial Invasion: 100%  Uterine Serosa Involvement: Present, focal  Cervical stromal Involvement: Not identified  Extent of involvement of other tissue/organs: Not identified  Peritoneal/Ascitic Fluid: Negative for carcinoma  Lymphovascular Invasion: Present  Regional Lymph Nodes:       Pelvic Lymph Nodes Examined:                                   6 Sentinel  3 Non-sentinel                                   9 Total       Pelvic Lymph Nodes with Metastasis: 0                           Macrometastasis: (>2.0 mm): 0                           Micrometastasis: (>0.2 mm and < 2.0 mm): 0                           Isolated Tumor Cells (<0.2 mm): 0                           Laterality of Lymph Node with Tumor: Not  applicable                           Extracapsular Extension: Not applicable       Para-aortic Lymph  Nodes Examined:                                    0 Sentinel                                    5 Non-sentinel                                    5 Total       Para-aortic Lymph Nodes with Metastasis: 0                           Macrometastasis: (>2.0 mm): 0                           Micrometastasis:  (>0.2 mm and < 2.0 mm): 0                           Isolated Tumor Cells (<0.2 mm): 0                           Laterality of Lymph Node with Tumor: Not  applicable                           Extracapsular Extension: Not applicable  Distant Metastasis:       Distant Site(s) Involved: Not applicable  Pathologic Stage Classification (pTNM, AJCC 8th Edition): pT3a, pN0  Ancillary Studies: MMR / MSI testing will be ordered  Representative Tumor Block: C4  Comment(s): Pancytokeratin was performed on the lymph nodes and is negative.   By immunohistochemistry, HER-2 is EQUIVOCAL (2+).  HER-2 by FISH is pending and will be reported in an addendum.   FLOURESCENCE IN-SITU HYBRIDIZATION RESULTS:   GROUP 1:  HER2 **POSITIVE**   On the tissue sample received from  this individual HER2 FISH was performed by a technologist and cell imaging and analysis on the [employer].   RATIO of HER2/CEN 17 SIGNALS: 2.50  AVERAGE HER2 COPY NUMBER PER CELL: 4.74   The ratio of HER2/CEN 17 result exceeds the cutoff value of >=2.0 and a copy number of HER2 signals exceeding the cutoff range of >=4.0 signals per cell.  Arch Pathol Lab Med 1:1, 2018.    10/28/2021 Cancer Staging   Staging form: Corpus Uteri - Carcinoma and Carcinosarcoma, AJCC 8th Edition - Pathologic stage from 10/28/2021: Stage III (pT3, pN0, cM0) - Signed by Lonn Hicks, MD on 10/28/2021 Stage prefix: Initial diagnosis   11/07/2021 Procedure   Successful placement of a right internal jugular approach power injectable Port-A-Cath. The catheter is ready for immediate use.   11/11/2021 - 02/26/2022 Chemotherapy   Patient is on Treatment Plan : UTERINE  Carboplatin  AUC 6 / Paclitaxel  q21d      Genetic Testing   Negative genetic testing. No pathogenic variants identified on the [facility] Multi-Cancer+RNA panel. VUS in MET called c.3220A>G, VUS in MUTYh called c.1417G>A and VUS in POLE called c.596G>T identified. The report date is 01/20/2022.  The Multi-Cancer Panel + RNA offered by [facility] includes sequencing and/or deletion duplication testing of the following 84 genes: AIP, ALK, APC, ATM, AXIN2,BAP1,  BARD1, BLM, BMPR1A, BRCA1, BRCA2, BRIP1, CASR, CDC73, CDH1, CDK4, CDKN1B, CDKN1C, CDKN2A (p14ARF), CDKN2A (p16INK4a), CEBPA, CHEK2, CTNNA1, DICER1, DIS3L2, EGFR (c.2369C>T, p.Thr790Met variant only), EPCAM (Deletion/duplication testing only), FH, FLCN, GATA2, GPC3, GREM1 (Promoter region deletion/duplication testing only), HOXB13 (c.251G>A, p.Gly84Glu), HRAS, KIT, MAX, MEN1, MET, MITF (c.952G>A, p.Glu318Lys variant only), MLH1, MSH2, MSH3, MSH6, MUTYH, NBN, NF1, NF2, NTHL1, PALB2, PDGFRA, PHOX2B, PMS2, POLD1, POLE, POT1, PRKAR1A, PTCH1, PTEN, RAD50, RAD51C, RAD51D, RB1, RECQL4, RET, RUNX1, SDHAF2, SDHA (sequence changes only), SDHB, SDHC, SDHD, SMAD4, SMARCA4, SMARCB1, SMARCE1, STK11, SUFU, TERC, TERT, TMEM127, TP53, TSC1, TSC2, VHL, WRN and WT1.   03/30/2022 Imaging   1. Bandlike opacity in the medial left lung apex is progressive in the interval, now with nodular component measuring up to 10 mm. While likely related to evolving atelectasis or scar, consider follow-up CT chest in 3 months to reassess. 2. Interval hysterectomy. 3. No evidence for metastatic disease in the chest, abdomen, or pelvis. 4. Trace free fluid in the upper pelvis, nonspecific. Of note, inferior pelvis is essentially obscured by beam hardening artifact from bilateral hip replacement. 5. Left colonic diverticulosis without diverticulitis. 6. Moderate stool volume. Imaging features could be compatible with constipation in the appropriate clinical setting. 7. Aortic Atherosclerosis  (ICD10-I70.0).   06/26/2022 Imaging   1. Increasing conspicuity and size of a previously bandlike and now nodular area of density in the LEFT upper lobe suspicious for metastatic process. PET scan and or biopsy could be helpful for further evaluation. 2. Stable small LEFT retropectoral lymph nodes. These warrant attention on follow-up imaging based on patient history. 3. Added density throughout the LEFT breast associated with surgical clips presumably postoperative and or post treatment related. 4. Aortic atherosclerosis.   Aortic Atherosclerosis (ICD10-I70.0).   10/29/2022 Imaging   1. The previously biopsied left upper lobe nodule is not significantly changed in size. There is a new fiducial marker along the inferior aspect of this nodule and a linear tail projecting posteriorly which may reflect a mucous impacted bronchus. Consider continued CT follow-up as clinically warranted. 2. No new or enlarging pulmonary nodules. No definite evidence of metastatic disease. 3. Interval resolution of previously demonstrated asymmetric increased density  in the left breast, likely postsurgical. 4. Aortic Atherosclerosis (ICD10-I70.0) and Emphysema (ICD10-J43.9).     06/10/2023 Imaging   CT CHEST ABDOMEN PELVIS W CONTRAST  Result Date: 06/09/2023 CLINICAL DATA:  Cervical cancer, assess treatment response, breast cancer * Tracking Code: BO * EXAM: CT CHEST, ABDOMEN, AND PELVIS WITH CONTRAST TECHNIQUE: Multidetector CT imaging of the chest, abdomen and pelvis was performed following the standard protocol during bolus administration of intravenous contrast. RADIATION DOSE REDUCTION: This exam was performed according to the departmental dose-optimization program which includes automated exposure control, adjustment of the mA and/or kV according to patient size and/or use of iterative reconstruction technique. CONTRAST:  OMNIPAQUE  IOHEXOL  300 MG/ML  SOLN COMPARISON:  CT chest, 10/27/2022, CT chest abdomen  pelvis, 03/27/2022 PET-CT, 07/08/2022 FINDINGS: CT CHEST FINDINGS Cardiovascular: Aortic atherosclerosis. Normal heart size. Left coronary artery calcifications. No pericardial effusion. Mediastinum/Nodes: No enlarged mediastinal, hilar, or axillary lymph nodes. Thyroid gland, trachea, and esophagus demonstrate no significant findings. Lungs/Pleura: Unchanged bandlike scarring and consolidation of the medial left apex containing a biopsy marking clip (series 4, image 116). Unchanged, bland appearing bandlike scarring of the left lung base (series 4, image 95). No pleural effusion or pneumothorax. Musculoskeletal: Status post left lumpectomy. No acute osseous findings. CT ABDOMEN PELVIS FINDINGS Hepatobiliary: No solid liver abnormality is seen. Contracted gallbladder. No gallstones, gallbladder wall thickening, or biliary dilatation. Pancreas: Unremarkable. No pancreatic ductal dilatation or surrounding inflammatory changes. Spleen: Normal in size without significant abnormality. Adrenals/Urinary Tract: Adrenal glands are unremarkable. Kidneys are normal, without renal calculi, solid lesion, or hydronephrosis. Evaluation of the bladder is significantly limited by dense metallic streak artifact from adjacent bilateral hip total arthroplasty. No obvious abnormality. Stomach/Bowel: Stomach is within normal limits. Appendix appears normal. No evidence of bowel wall thickening, distention, or inflammatory changes. Pancolonic diverticulosis severe in the sigmoid. Vascular/Lymphatic: Aortic atherosclerosis. No enlarged abdominal or pelvic lymph nodes. Reproductive: Status post hysterectomy. Evaluation of the low pelvis is significantly limited by dense metallic streak artifact from bilateral hip arthroplasty. Other: No abdominal wall hernia or abnormality. No ascites. Musculoskeletal: No acute osseous findings. Status post bilateral hip total arthroplasty with dense metallic streak artifact. IMPRESSION: 1. Status post  hysterectomy. Evaluation of the low pelvis is significantly limited by dense metallic streak artifact from bilateral hip arthroplasty. Within this limitation, no obvious recurrent or metastatic disease in the pelvis. 2. No evidence of lymphadenopathy or metastatic disease in the chest, abdomen, or pelvis. 3. Unchanged bandlike scarring and consolidation of the medial left apex containing a biopsy marking clip, previously biopsied with reported benign infectious pathology. 4. Pancolonic diverticulosis. 5. Coronary artery disease. Aortic Atherosclerosis (ICD10-I70.0). Electronically Signed   By: Marolyn JONETTA Jaksch M.D.   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  There are osteophytes in all 3 compartments and varus malalignment.  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  There is varus malalignment.  XR Lumbar Spine 2-3 Views  Result Date: 05/24/2023 2 views of the lumbar spine on show a degenerative scoliosis and degenerative changes at multiple levels.  There is no acute findings otherwise.     12/03/2023 Imaging   CT ABDOMEN PELVIS W CONTRAST Result Date: 12/09/2023 CLINICAL DATA:  High risk endometrial cancer. History of breast  and lung cancer. * Tracking Code: BO * EXAM: CT ABDOMEN AND PELVIS WITH CONTRAST TECHNIQUE: Multidetector CT imaging of the abdomen and pelvis was performed using the standard protocol following bolus administration of intravenous contrast. RADIATION DOSE REDUCTION: This exam was performed according to the departmental dose-optimization program which includes automated exposure control, adjustment of the mA and/or kV according to patient size and/or use of iterative reconstruction technique.  CONTRAST:  OMNIPAQUE  IOHEXOL  300 MG/ML  SOLN COMPARISON:  CT scan 06/08/2023 FINDINGS: Lower chest: Descending thoracic aortic atheromatous vascular calcification. Small type 1 hiatal hernia. Hepatobiliary: Unremarkable Pancreas: Unremarkable Spleen: Unremarkable Adrenals/Urinary Tract: Mild renal sinus lipomatosis. 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 Urinary bladder empty partially obscured by streak artifact from the patient's bilateral hip implants. Mild renal cortical thinning. Stomach/Bowel: Sigmoid colon diverticulosis. Prominent stool throughout the colon favors constipation. No dilated bowel. Scattered diverticula in the remainder of the colon. Vascular/Lymphatic: Atherosclerosis is present, including aortoiliac atherosclerotic disease. Reproductive: Hysterectomy. No obvious mass lesion along the vaginal cuff for elsewhere in the pelvis, the region is somewhat obscured by streak artifact from the patient's hip implants. Other: No supplemental non-categorized findings. Musculoskeletal: Mild grade 1 degenerative anterolisthesis at L4-5 and mild grade 1 degenerative retrolisthesis at L1-2. Right foraminal impingement at L5-S1 and to a lesser degree at L4-5 primarily due to degenerative disc disease and spondylosis. New irregularity in the upper coccyx suggesting a transverse fracture for example on image 82 series 6, not present on 06/08/2023. Mild presacral thickening in the vicinity of this presumed subacute fracture. It is possible although less likely that this appearance is due to motion artifact. IMPRESSION: 1. No findings of metastatic disease in the abdomen or pelvis. 2. New irregularity in the upper coccyx suggesting a transverse fracture, not present on 06/08/2023. Mild presacral thickening in the vicinity of this presumed subacute fracture. It is possible although less likely that this appearance is due to motion artifact. 3. Prominent stool throughout the colon favors  constipation. 4. Sigmoid colon diverticulosis. 5. Small type 1 hiatal hernia. 6. 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 7. Right foraminal impingement at L5-S1 and to a lesser degree at L4-5 primarily due to degenerative disc disease and spondylosis. 8. Aortic atherosclerosis. Aortic Atherosclerosis (ICD10-I70.0). Electronically Signed   By: Ryan Salvage M.D.   On: 12/09/2023 15:32      Breast cancer of upper-outer quadrant of left female breast (HCC)  11/19/2021 Imaging   IMPRESSION: 1. Highly suspicious mass in the left breast at 1 o'clock measuring 1.9 cm.   2.  No mammographic evidence of malignancy in the right breast   11/27/2021 Procedure   Appropriate positioning of the venous shaped biopsy marking clip at the site of biopsy in the 1 o'clock location of the LEFT breast.   12/02/2021 Initial Diagnosis   Breast cancer of upper-outer quadrant of left female breast (HCC)   12/02/2021 Cancer Staging   Staging form: Breast, AJCC 8th Edition - Clinical stage from 12/02/2021: cT1c, cN0, cM0 - Signed by Lonn Hicks, MD on 12/02/2021 Stage prefix: Initial diagnosis    Genetic Testing   Negative genetic testing. No pathogenic variants identified on the [facility] Multi-Cancer+RNA panel. VUS in MET called c.3220A>G, VUS in MUTYh called c.1417G>A and VUS in POLE called c.596G>T identified. The report date is 01/20/2022.  The Multi-Cancer Panel + RNA offered by [facility] includes sequencing and/or deletion duplication testing of the following 84 genes: AIP, ALK, APC, ATM, AXIN2,BAP1,  BARD1,  BLM, BMPR1A, BRCA1, BRCA2, BRIP1, CASR, CDC73, CDH1, CDK4, CDKN1B, CDKN1C, CDKN2A (p14ARF), CDKN2A (p16INK4a), CEBPA, CHEK2, CTNNA1, DICER1, DIS3L2, EGFR (c.2369C>T, p.Thr790Met variant only), EPCAM (Deletion/duplication testing only), FH, FLCN, GATA2, GPC3, GREM1 (Promoter region deletion/duplication testing only), HOXB13 (c.251G>A, p.Gly84Glu), HRAS, KIT, MAX, MEN1, MET, MITF (c.952G>A,  p.Glu318Lys variant only), MLH1, MSH2, MSH3, MSH6, MUTYH, NBN, NF1, NF2, NTHL1, PALB2, PDGFRA, PHOX2B, PMS2, POLD1, POLE, POT1, PRKAR1A, PTCH1, PTEN, RAD50, RAD51C, RAD51D, RB1, RECQL4, RET, RUNX1, SDHAF2, SDHA (sequence changes only), SDHB, SDHC, SDHD, SMAD4, SMARCA4, SMARCB1, SMARCE1, STK11, SUFU, TERC, TERT, TMEM127, TP53, TSC1, TSC2, VHL, WRN and WT1.   02/23/2022 Imaging   .WHO classification is NORMAL BONE MINERAL DENSITY.    03/30/2022 -  Anti-estrogen oral therapy   She is started on Arimidex    06/16/2022 Pathology Results   FINAL MICROSCOPIC DIAGNOSIS:   A. BREAST, LEFT, LUMPECTOMY:  -  Invasive mammary carcinoma (NST)/invasive ductal carcinoma (NOS) [retained membranous e-cadherin positivity], 1.7 cm in greatest dimension, Nottingham histologic score 2 out of 3.  -  Ductal carcinoma in situ (DCIS), cribriform type, nuclear grade 2 of 3.  -  Biomarkers, performed on the biopsy specimen, ER positive (90% strong), PR positive (11 to 50% moderate), HER2/neu negative  -  Margins negative in conjunction with parts a B and C (closest margin anterior at 5 mm and superior at 9 mm all others greater than 10 mm).  pT1c pNX pM n/a   B. BREAST, LEFT POSTERIOR MARGIN, EXCISION:  -  Benign breast tissue, negative for malignancy (new margin 1.5 cm)   C. BREAST, LEFT MEDIAL MARGIN, EXCISION:  -  Benign breast tissue, negative for malignancy (new margin 1.5 cm)   ONCOLOGY TABLE:   INVASIVE CARCINOMA OF THE BREAST:  Resection   Procedure: Lumpectomy  Specimen Laterality: Left  Histologic Type: Invasive mammary carcinoma of no special type  (NST)/invasive ductal carcinoma (NOS)  Histologic Grade:       Glandular (Acinar)/Tubular Differentiation: 3       Nuclear Pleomorphism: 2       Mitotic Rate: 1       Overall Grade: 2  Tumor Size: 1.7 x 1.5 x 1.0 cm  Ductal Carcinoma In Situ: Cribriform type, nuclear grade 2 of 3  Tumor Extent: Limited to breast parenchyma  Treatment Effect in the  Breast: It is noted the patient has received  chemotherapy for endometrial carcinoma as well as antrum aromatase  inhibitor without morphologic evidence of tumor response  Margins: All margins negative for invasive carcinoma       Distance from Closest Margin (mm): 5 mm and 9 mm       Specify Closest Margin (required only if <2mm): Anterior (5) and  Superior (9)  DCIS Margins: Uninvolved by DCIS       Distance from Closest Margin (mm): 5 mm       Specify Closest Margin (required only if <33mm): Posterior  Regional Lymph Nodes: Not applicable (no lymph nodes submitted or found)       Number of Lymph Nodes Examined: 0       Number of Sentinel Nodes Examined: 0       Number of Lymph Nodes with Macrometastases (>2 mm): N/A       Number of Lymph Nodes with Micrometastases: N/A       Number of Lymph Nodes with Isolated Tumor Cells (=0.2 mm or =200  cells): N/A       Size of Largest Metastatic Deposit (mm): N/A  Extranodal Extension: N/A  Distant Metastasis:       Distant Site(s) Involved: N/A  Breast Biomarker Testing Performed on Previous Biopsy:       Testing Performed on Case Number: [id number]             Estrogen Receptor: Positive, >90% strong             Progesterone Receptor: Positive, 11 to 50% moderate             HER2: IHC 0/3; negative             Ki-67: Not performed  Pathologic Stage Classification (pTNM, AJCC 8th Edition): pT1c, pNx  Representative Tumor Block: A5  Comment(s): [None]    06/16/2022 Surgery   Preoperative diagnosis: Left breast cancer, clinical stage I  Procedure: Left breast radioactive seed guided lumpectomy Surgeon: Dr. Adina Bury   07/09/2022 PET scan   1. Hypermetabolic apical left upper lobe nodule, worrisome for primary bronchogenic carcinoma. Metastatic disease is also possibility. 2. Mild residual hypermetabolism associated with the left breast, likely postoperative/post treatment in etiology. Difficult to exclude residual disease. 3.  Punctate left renal stone.  4. Aortic atherosclerosis (ICD10-I70.0). coronary artery calcification.   07/20/2022 Pathology Results   A. LUNG, LEFT UPPER LOBE, NEEDLE CORE BIOPSY:  - Necrotizing granulomatous inflammation.  See comment.   COMMENT:   AFB, PAS-F, and GMS special stains are negative for organisms.    02/25/2023 Mammogram   MM DIAG BREAST TOMO BILATERAL  Result Date: 02/25/2023 CLINICAL DATA:  Patient is status post lumpectomy of the LEFT breast in July 2023 for [facility]. Patient could not tolerate Arimidex . Patient deferred sentinel node sampling as well as radiation. Patient with a history endometrial cancer in 2022 as well; she underwent chemotherapy for this with slight decrease size of the breast cancer prior to surgical excision. EXAM: DIGITAL DIAGNOSTIC BILATERAL MAMMOGRAM WITH TOMOSYNTHESIS TECHNIQUE: Bilateral digital diagnostic mammography and breast tomosynthesis was performed. COMPARISON:  Previous exam(s). ACR Breast Density Category c: The breasts are heterogeneously dense, which may obscure small masses. FINDINGS: There is density and architectural distortion within the LEFT breast, consistent with postsurgical changes. These are new in comparison to prior. A questioned asymmetry in the RIGHT breast resolved with additional views, consistent with overlapping tissue. No suspicious mass, distortion, or microcalcifications are identified to suggest presence of malignancy. IMPRESSION: No mammographic evidence of malignancy bilaterally. RECOMMENDATION: Recommend bilateral diagnostic mammogram (with RIGHT and LEFT breast ultrasound if deemed necessary) in 1 year. I have discussed the findings and recommendations with the patient. If applicable, a reminder letter will be sent to the patient regarding the next appointment. BI-RADS CATEGORY  2: Benign. Electronically Signed   By: Corean Salter M.D.   On: 02/25/2023 14:01     05/08/2024 Imaging   MM DIAG BREAST TOMO BILATERAL Result  Date: 05/08/2024 CLINICAL DATA:  Status post LEFT lumpectomy in July 2023. This was diagnosed in December 2022. Patient was undergoing chemotherapy for endometrial cancer; no pathologic response was noted on lumpectomy specimen. Negative margins. Patient declined radiation of the breast and sentinel node biopsy. Patient could not tolerate endocrine therapy. EXAM: DIGITAL DIAGNOSTIC BILATERAL MAMMOGRAM WITH TOMOSYNTHESIS AND CAD TECHNIQUE: Bilateral digital diagnostic mammography and breast tomosynthesis was performed. The images were evaluated with computer-aided detection. COMPARISON:  Previous exam(s). ACR Breast Density Category c: The breasts are heterogeneously dense, which may obscure small masses. FINDINGS: There is density and architectural distortion within the LEFT breast, consistent with postsurgical changes. These are stable  in comparison to prior. No suspicious mass, distortion, or microcalcifications are identified to suggest presence of malignancy. IMPRESSION: No mammographic evidence of malignancy bilaterally. RECOMMENDATION: Recommend bilateral diagnostic mammogram (with RIGHT and LEFT breast ultrasound if deemed necessary) in 1 year. This will establish over 2 years of stability status post lumpectomy. I have discussed the findings and recommendations with the patient. If applicable, a reminder letter will be sent to the patient regarding the next appointment. BI-RADS CATEGORY  2: Benign. Electronically Signed   By: Corean Salter M.D.   On: 05/08/2024 16:03        Interval History: Overall doing well.  Had her left knee replaced in February, has graduated from physical therapy.  Denies any vaginal bleeding.  Reports baseline bowel and bladder function although notes some worsening of her urinary urge incontinence as well as feeling sometimes like she has difficulty emptying her bladder completely.  Denies any abdominal or pelvic pain.  Past Medical/Surgical History: Past Medical  History:  Diagnosis Date   Actinic keratosis 06/04/2021   right anteromedial thigh   Anemia    Arthritis    Basal cell carcinoma 06/04/2021   right suprapubic, EDC 07/29/2021   Basal cell carcinoma 07/29/2021   left nasal ala. atypical basaloid cells   Basal cell carcinoma 03/04/2023   Right lateral cheek. Nodular. Referral to radiation oncology.   BCC (basal cell carcinoma of skin) 12/03/2022   superficial right superior shoulder, 03/04/23  ED&C   Family history of lung cancer    GERD (gastroesophageal reflux disease)    History of basal cell carcinoma 07/29/2021   left thigh, EDC at time of bx   History of kidney stones    on CT   History of radiation therapy    vaginal brachytherapy [facility] 12/18/2021-01/13/2022 Dr Lynwood Nasuti   HLD (hyperlipidemia)    Hypertension    Hypothyroidism    Lymphedema    Pneumonia    HX OF YEARS AGO   PONV (postoperative nausea and vomiting)     Past Surgical History:  Procedure Laterality Date   BREAST BIOPSY Left 11/27/2021   u/s bs, venus clip,  INVASIVE MAMMARY CARCINOMA   BREAST CYST ASPIRATION     BREAST LUMPECTOMY Left 06/16/2022   BREAST LUMPECTOMY WITH RADIOACTIVE SEED LOCALIZATION Left 06/16/2022   Procedure: LEFT BREAST LUMPECTOMY WITH RADIOACTIVE SEED LOCALIZATION;  Surgeon: Ebbie Cough, MD;  Location: [facility];  Service: General;  Laterality: Left;   BRONCHIAL BIOPSY  07/10/2022   Procedure: BRONCHIAL BIOPSIES;  Surgeon: Gladis Leonor HERO, MD;  Location: [facility] ENDOSCOPY;  Service: Pulmonary;;   BRONCHIAL BRUSHINGS  07/10/2022   Procedure: BRONCHIAL BRUSHINGS;  Surgeon: Gladis Leonor HERO, MD;  Location: [facility] ENDOSCOPY;  Service: Pulmonary;;   BRONCHIAL NEEDLE ASPIRATION BIOPSY  07/10/2022   Procedure: BRONCHIAL NEEDLE ASPIRATION BIOPSIES;  Surgeon: Gladis Leonor HERO, MD;  Location: [facility] ENDOSCOPY;  Service: Pulmonary;;   BRONCHIAL WASHINGS  07/10/2022   Procedure: BRONCHIAL WASHINGS;  Surgeon: Gladis Leonor HERO,  MD;  Location: [facility] ENDOSCOPY;  Service: Pulmonary;;   cataract Bilateral    FIDUCIAL MARKER PLACEMENT  07/10/2022   Procedure: FIDUCIAL MARKER PLACEMENT;  Surgeon: Gladis Leonor HERO, MD;  Location: [facility] ENDOSCOPY;  Service: Pulmonary;;   HIP SURGERY Bilateral    IR IMAGING GUIDED PORT INSERTION  11/07/2021   IR REMOVAL TUN ACCESS W/ PORT W/O FL MOD SED  01/06/2022   KNEE ARTHROSCOPY Left 2003   NECK SURGERY  1997   Fusion C3, C4, C5  ROBOTIC ASSISTED TOTAL HYSTERECTOMY WITH BILATERAL SALPINGO OOPHERECTOMY Bilateral 10/14/2021   Procedure: XI ROBOTIC ASSISTED TOTAL HYSTERECTOMY WITH BILATERAL SALPINGO OOPHORECTOMY, LYMPH NODE DISSECTION,  LAPAROTOMY;  Surgeon: Viktoria Comer SAUNDERS, MD;  Location: [facility];  Service: Gynecology;  Laterality: Bilateral;   SENTINEL NODE BIOPSY N/A 10/14/2021   Procedure: SENTINEL NODE DISSECTION;  Surgeon: Viktoria Comer SAUNDERS, MD;  Location: [facility];  Service: Gynecology;  Laterality: N/A;   THYROIDECTOMY  2007   TOTAL HIP ARTHROPLASTY Bilateral    Left Hip -2015, Right Hip 208   TOTAL KNEE ARTHROPLASTY Left 01/13/2024   Procedure: LEFT TOTAL KNEE ARTHROPLASTY;  Surgeon: Vernetta Lonni GRADE, MD;  Location: [facility];  Service: Orthopedics;  Laterality: Left;   VIDEO BRONCHOSCOPY WITH RADIAL ENDOBRONCHIAL ULTRASOUND  07/10/2022   Procedure: RADIAL ENDOBRONCHIAL ULTRASOUND;  Surgeon: Gladis Leonor HERO, MD;  Location: [facility] ENDOSCOPY;  Service: Pulmonary;;    Family History  Problem Relation Age of Onset   Cancer Father        Lung Cancer   Lung cancer Father        smoked   Skin cancer Maternal Grandmother    Skin cancer Paternal Grandmother    Cancer Paternal Aunt        mouth   Colon cancer Neg Hx    Breast cancer Neg Hx    Ovarian cancer Neg Hx    Endometrial cancer Neg Hx    Pancreatic cancer Neg Hx    Prostate cancer Neg Hx     Social History   Socioeconomic History   Marital status: Married    Spouse name: Lamar   Number of children: 2    Years of education: Not on file   Highest education level: Not on file  Occupational History   Not on file  Tobacco Use   Smoking status: Former    Current packs/day: 0.00    Average packs/day: 0.5 packs/day for 3.0 years (1.5 ttl pk-yrs)    Types: Cigarettes    Start date: 12/01/1963    Quit date: 11/30/1966    Years since quitting: 57.5   Smokeless tobacco: Never  Vaping Use   Vaping status: Never Used  Substance and Sexual Activity   Alcohol use: Yes    Comment: RARE   Drug use: Never   Sexual activity: Not Currently  Other Topics Concern   Not on file  Social History Narrative   Retired Charity fundraiser   Social Drivers of Corporate investment banker Strain: Low Risk  (11/08/2023)   Received from [facility]   Overall Financial Resource Strain (CARDIA)    Difficulty of Paying Living Expenses: Not hard at all  Food Insecurity: No Food Insecurity (04/04/2024)   Received from [facility]   Hunger Vital Sign    Within the past 12 months, you worried that your food would run out before you got the money to buy more.: Never true    Within the past 12 months, the food you bought just didn't last and you didn't have money to get more.: Never true  Transportation Needs: No Transportation Needs (04/04/2024)   Received from [facility]   PRAPARE - Transportation    Lack of Transportation (Medical): No    Lack of Transportation (Non-Medical): No  Physical Activity: Insufficiently Active (10/08/2021)   [facility]   Exercise Vital Sign    On average, how many days per week do you engage in moderate to strenuous exercise (like  a brisk walk)?: 1 day    On average, how many minutes do you engage in exercise at this level?: 30 min  Stress: Stress Concern Present (10/08/2021)   Received from [facility] of Occupational Health - Occupational Stress Questionnaire    Feeling of Stress : Rather much  Social Connections: Socially Integrated  (01/13/2024)   Social Connection and Isolation Panel    Frequency of Communication with Friends and Family: More than three times a week    Frequency of Social Gatherings with Friends and Family: More than three times a week    Attends Religious Services: More than 4 times per year    Active Member of [employer] or Organizations: Yes    Attends Engineer, structural: More than 4 times per year    Marital Status: Married    Current Medications:  Current Outpatient Medications:    acetaminophen  (TYLENOL ) 500 MG tablet, Take 1,000 mg by mouth every 6 (six) hours as needed (pain.)., Disp: , Rfl:    cholecalciferol  (VITAMIN D3) 25 MCG (1000 UNIT) tablet, Take 1,000 Units by mouth in the morning and at bedtime., Disp: , Rfl:    gabapentin  (NEURONTIN ) 100 MG capsule, Take 100 mg by mouth at bedtime., Disp: , Rfl:    levothyroxine  (SYNTHROID ) 125 MCG tablet, Take 125 mcg by mouth daily before breakfast., Disp: , Rfl:    loperamide  (IMODIUM  A-D) 2 MG tablet, 1/2 tablet by mouth every other day, Disp: 45 tablet, Rfl: 3   losartan  (COZAAR ) 100 MG tablet, Take 100 mg by mouth in the morning., Disp: , Rfl:    Magnesium  Bisglycinate (MAG GLYCINATE PO), Take 240 mg by mouth in the morning and at bedtime., Disp: , Rfl:    Menthol , Topical Analgesic, (BIOFREEZE EX), Apply 1 Application topically 3 (three) times daily as needed (pain.)., Disp: , Rfl:    Multiple Vitamin (MULTIVITAMIN WITH MINERALS) TABS tablet, Take 1 tablet by mouth in the morning. Centrum for Adults 50+, Disp: , Rfl:    Omega-3 1000 MG CAPS, Take 1,000-2,000 mg by mouth See admin instructions. Take 2 capsules by mouth in the morning & take 1 capsule by mouth at night., Disp: , Rfl:    ondansetron  (ZOFRAN -ODT) 4 MG disintegrating tablet, Take 1 tablet (4 mg total) by mouth every 8 (eight) hours as needed for nausea or vomiting., Disp: 20 tablet, Rfl: 0   pantoprazole  (PROTONIX ) 20 MG tablet, Take 20 mg by mouth daily as needed for heartburn  or indigestion., Disp: , Rfl:    Probiotic Product (PROBIOTIC PO), Take 1 capsule by mouth in the morning., Disp: , Rfl:    propranolol  (INDERAL ) 40 MG tablet, Take 40-80 mg by mouth See admin instructions. Take 2 tablets (80 mg) by mouth in the morning & take 1 tablet (40 mg) by mouth in the evening., Disp: , Rfl:    rosuvastatin  (CRESTOR ) 5 MG tablet, Take 5 mg by mouth at bedtime., Disp: , Rfl:    Simethicone  125 MG TABS, Take 250 mg by mouth 2 (two) times daily as needed (gas)., Disp: , Rfl:    temazepam  (RESTORIL ) 7.5 MG capsule, Take 15 mg by mouth at bedtime as needed for sleep., Disp: , Rfl:    tiZANidine  (ZANAFLEX ) 2 MG tablet, Take 1 tablet (2 mg total) by mouth every 6 (six) hours as needed., Disp: 30 tablet, Rfl: 0   venlafaxine  XR (EFFEXOR -XR) 75 MG 24 hr capsule, Take 75 mg by mouth every evening.,  Disp: , Rfl:    vitamin E 45 MG (100 UNITS) capsule, Take by mouth daily., Disp: , Rfl:   Review of Systems: + Ringing in ears, vision problems, leg swelling, incontinence, hot flashes, joint pain, back pain, muscle pain/cramps, headache, numbness Denies appetite changes, fevers, chills, fatigue, unexplained weight changes. Denies hearing loss, neck lumps or masses, mouth sores or voice changes. Denies cough or wheezing.  Denies shortness of breath. Denies chest pain or palpitations.  Denies abdominal distention, pain, blood in stools, constipation, diarrhea, nausea, vomiting, or early satiety. Denies pain with intercourse, dysuria, frequency, hematuria. Denies pelvic pain, vaginal bleeding or vaginal discharge.   Denies itching, rash, or wounds. Denies dizziness or seizures. Denies swollen lymph nodes or glands, denies easy bruising or bleeding. Denies anxiety, depression, confusion, or decreased concentration.  Physical Exam: BP (!) 160/72 (BP Location: Left Arm, Patient Position: Sitting)   Pulse 98   Temp 98 F (36.7 C) (Oral)   Resp 20   Wt 190 lb 9.6 oz (86.5 kg)   SpO2  96%   BMI 33.76 kg/m  General: Alert, oriented, no acute distress. HEENT: Normocephalic, atraumatic, sclera anicteric. Chest: Clear to auscultation bilaterally.  No wheezes or rhonchi. Cardiovascular: Regular rate and rhythm, no murmurs. Abdomen: Obese, soft, nontender.  Normoactive bowel sounds.  No masses or hepatosplenomegaly appreciated.  Well-healed incisions. Extremities: Grossly normal range of motion.  Warm, well perfused.  Trace edema bilaterally. Skin: No rashes or lesions noted. Lymphatics: No cervical, supraclavicular, or inguinal adenopathy. GU: Normal appearing external genitalia without erythema, excoriation, or lesions.  Speculum exam reveals mildly atrophic vaginal mucosa, radiation changes present.  No bleeding or discharge.  Small urethral carbuncle noted.  Bimanual exam reveals no masses or nodularity.    Laboratory & Radiologic Studies: None new  Assessment & Plan: Kristin Carlson is a 83 y.o. 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 Completed adjuvant chemotherapy end 01/2022, VBT completed 01/13/22.  MMR intact, HER2+.  Genetic testing with no pathogenic mutations, VUS in MET, MUTYH, and POLE.  Last imaging in 12/2023 negative for recurrent disease.   She is doing very well. NED on exam today.  Discussed slowly worsening urinary symptoms and offered referral to urogynecology.  Referral placed today.   She sees Dr. Shannon in October.  Per NCCN surveillance recommendations, we will transition to visits every 6 months after her upcoming appointment with radiation oncology.  We will plan to see her back in March 2026.  We discussed signs and symptoms that would be concerning for cancer recurrence, and I stressed the importance of calling if she develops any of these.  22 minutes of total time was spent for this patient encounter, including preparation, face-to-face counseling with the patient and coordination of care, and documentation  of the encounter.  Comer Dollar, MD  Division of Gynecologic Oncology  Department of Obstetrics and Gynecology  [facility]

## 2024-06-08 NOTE — Patient Instructions (Signed)
 It was good to see you today.  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  We will see you for follow-up in 9 months.  I am putting in a referral to get you scheduled with urogynecology given your urinary symptoms.  As always, if you develop any new and concerning symptoms before your next visit, please call to see me sooner.

## 2024-07-06 NOTE — Telephone Encounter (Signed)
 [facility]

## 2024-08-24 ENCOUNTER — Ambulatory Visit: Admitting: Radiation Oncology

## 2024-08-31 ENCOUNTER — Ambulatory Visit
Admission: RE | Admit: 2024-08-31 | Discharge: 2024-08-31 | Disposition: A | Discharge status: Home / Self Care | Source: Ambulatory Visit | Attending: Radiation Oncology | Admitting: Radiation Oncology

## 2024-08-31 ENCOUNTER — Encounter: Payer: Self-pay | Admitting: Radiation Oncology

## 2024-08-31 VITALS — BP 167/78 | HR 64 | Temp 97.8°F | Resp 20 | Ht 64.0 in | Wt 189.0 lb

## 2024-08-31 DIAGNOSIS — C541 Malignant neoplasm of endometrium: Secondary | ICD-10-CM

## 2024-08-31 DIAGNOSIS — Z9221 Personal history of antineoplastic chemotherapy: Secondary | ICD-10-CM | POA: Insufficient documentation

## 2024-08-31 DIAGNOSIS — Z79899 Other long term (current) drug therapy: Secondary | ICD-10-CM | POA: Insufficient documentation

## 2024-08-31 DIAGNOSIS — Z8542 Personal history of malignant neoplasm of other parts of uterus: Secondary | ICD-10-CM | POA: Diagnosis present

## 2024-08-31 DIAGNOSIS — Z853 Personal history of malignant neoplasm of breast: Secondary | ICD-10-CM | POA: Diagnosis not present

## 2024-08-31 DIAGNOSIS — Z79890 Hormone replacement therapy (postmenopausal): Secondary | ICD-10-CM | POA: Insufficient documentation

## 2024-08-31 DIAGNOSIS — Z923 Personal history of irradiation: Secondary | ICD-10-CM | POA: Diagnosis not present

## 2024-08-31 NOTE — Progress Notes (Signed)
 Jenella C Zarzycki is here today for follow up post radiation to the pelvic.  They completed their radiation on: 01/13/22  Does the patient complain of any of the following:  Pain:Denies Abdominal bloating: Denies Diarrhea/Constipation: Denies Nausea/Vomiting: Denies Vaginal Discharge: Denies Blood in Urine or Stool:  Urinary Issues (dysuria/incomplete emptying/ incontinence/ increased frequency/urgency): She reports not using vaginal dilators. Reinforced the importance of use.Denies dysuria. Increased incontinence and urgency. Upcoming GYN appointment on 09/20/24. Does patient report using vaginal dilator 2-3 times a week and/or sexually active 2-3 weeks: She reports not using vaginal dilators. Reinforced the importance of use. Post radiation skin changes: Denies   Additional comments if applicable:  BP (!) 167/78 (BP Location: Right Arm, Patient Position: Sitting, Cuff Size: Large)   Pulse 64   Temp 97.8 F (36.6 C)   Resp 20   Ht 5' 4 (1.626 m)   Wt 189 lb (85.7 kg)   SpO2 98%   BMI 32.44 kg/m

## 2024-08-31 NOTE — Progress Notes (Signed)
 Radiation Oncology         [phone] ________________________________  Name: Kristin Carlson MRN: [id number]  Date: 08/31/2024  DOB: [birth year]  Follow-Up Visit Note  CC: Camelia Sherwood BIRCH, FNP  Camelia Sherwood BIRCH, FNP    ICD-10-CM   1. Endometrial cancer (HCC)  C54.1        Diagnosis: Stage III (pT3, pN0, cM0) endometrial cancer, high-grade serous; s/p VBT completed on 01/13/2022   History of Left Breast UOQ, Invasive Mammary Carcinoma with DCIS, ER+ / PR+ / Her2-, Grade 2 : s/p lumpectomy in 2022   Interval Since Last Radiation: 2 years, 7 months, 19 days   Intent: Curative  Radiation Treatment Dates: 12/18/2021 through 01/13/2022 Site Technique Total Dose (Gy) Dose per Fx (Gy) Completed Fx Beam Energies  Vagina: Pelvis HDR-brachy 30/30 6 5/5 Ir-192      Narrative:  The patient returns today for routine follow-up. She was last seen in office on 02/24/24 for a routine follow up. Patient continued to follow up with her specialists to manage her chronic conditions.     In the interval since she was last seen, she presented for a follow up visit with Dr. Viktoria on 06/08/24 during which she was noted NED on examination without any evidence of disease recurrence. She complained at that time of urinary symptoms and was referred to urogynecology for issue.  No other significant oncologic interval history since the patient was last seen.   Patient reports to be doing well overall today.  She reports urinary urgency and incomplete emptying of her bladder.  She is scheduled to see urogynecology at the end of this month. She denies any abdominal pain, abnormal bloating, vaginal bleeding, or changes in her bowel habits. She is not using her dilators regularly.    Allergies:  is allergic to codeine, lactose, penicillins, sulfa antibiotics, and vancomycin .  Meds: Current Outpatient Medications  Medication Sig Dispense Refill   acetaminophen  (TYLENOL ) 500 MG tablet Take 1,000 mg by mouth  every 6 (six) hours as needed (pain.).     cholecalciferol  (VITAMIN D3) 25 MCG (1000 UNIT) tablet Take 1,000 Units by mouth in the morning and at bedtime.     levothyroxine  (SYNTHROID ) 125 MCG tablet Take 125 mcg by mouth daily before breakfast.     loperamide  (IMODIUM  A-D) 2 MG tablet 1/2 tablet by mouth every other day 45 tablet 3   losartan  (COZAAR ) 100 MG tablet Take 100 mg by mouth in the morning.     Magnesium  Bisglycinate (MAG GLYCINATE PO) Take 240 mg by mouth in the morning and at bedtime.     Menthol , Topical Analgesic, (BIOFREEZE EX) Apply 1 Application topically 3 (three) times daily as needed (pain.).     Multiple Vitamin (MULTIVITAMIN WITH MINERALS) TABS tablet Take 1 tablet by mouth in the morning. Centrum for Adults 50+     Omega-3 1000 MG CAPS Take 1,000-2,000 mg by mouth See admin instructions. Take 2 capsules by mouth in the morning & take 1 capsule by mouth at night.     ondansetron  (ZOFRAN -ODT) 4 MG disintegrating tablet Take 1 tablet (4 mg total) by mouth every 8 (eight) hours as needed for nausea or vomiting. 20 tablet 0   pantoprazole  (PROTONIX ) 20 MG tablet Take 20 mg by mouth daily as needed for heartburn or indigestion.     Probiotic Product (PROBIOTIC PO) Take 1 capsule by mouth in the morning.     propranolol  (INDERAL ) 40 MG tablet Take 40-80 mg by mouth  See admin instructions. Take 2 tablets (80 mg) by mouth in the morning & take 1 tablet (40 mg) by mouth in the evening.     rosuvastatin  (CRESTOR ) 5 MG tablet Take 5 mg by mouth at bedtime.     Simethicone  125 MG TABS Take 250 mg by mouth 2 (two) times daily as needed (gas).     temazepam  (RESTORIL ) 7.5 MG capsule Take 15 mg by mouth at bedtime as needed for sleep.     tiZANidine  (ZANAFLEX ) 2 MG tablet Take 1 tablet (2 mg total) by mouth every 6 (six) hours as needed. 30 tablet 0   venlafaxine  XR (EFFEXOR -XR) 75 MG 24 hr capsule Take 75 mg by mouth every evening.     vitamin E 45 MG (100 UNITS) capsule Take by mouth  daily.     No current facility-administered medications for this encounter.    Physical Findings: The patient is in no acute distress. Patient is alert and oriented.  height is 5' 4 (1.626 m) and weight is 189 lb (85.7 kg). Her temperature is 97.8 F (36.6 C). Her blood pressure is 167/78 (abnormal) and her pulse is 64. Her respiration is 20 and oxygen saturation is 98%. .  No significant changes. Lungs are clear to auscultation bilaterally. Heart has regular rate and rhythm. No palpable cervical, supraclavicular, or axillary adenopathy. Abdomen soft, non-tender, normal bowel sounds.   On pelvic examination the external genitalia were unremarkable. A speculum exam was performed.    No mucosal lesions noted in the vaginal vault.  Some mild agglutination noted along the region of the mid vagina.  Radiation changes noted along the region of the cuff.  On bimanual and rectovaginal examination no pelvic masses appreciated.  Vaginal cuff intact.  Rectal sphincter tone good.   Lab Findings: Lab Results  Component Value Date   WBC 9.9 01/14/2024   HGB 11.1 (L) 01/14/2024   HCT 34.8 (L) 01/14/2024   MCV 94.6 01/14/2024   PLT 164 01/14/2024    Radiographic Findings: No results found.  Impression: Stage III (pT3, pN0, cM0) endometrial cancer, high-grade serous; s/p adjuvant chemotherapy and VBT completed on 01/13/2022  The patient is doing well overall today. No evidence of disease recurrence on clinical exam today.    She is scheduled to see urogynecology on 09/21/2024.   Restaging CT A/P demonstrated no findings of metastatic disease.    Her last mammogram on 06/09/20225 showed no mammographic evidence of malignancy bilaterally.   Plan:  Per NCCN guidelines, patient will follow up with Dr. Viktoria in 6 months and Dr. Lonn in January. We will see her in 12 months. Patient was educated on signs/symptoms that may indicate disease recurrence and understands to notify us  if she experiences  any of them.    30 minutes of total time was spent for this patient encounter, including preparation, face-to-face counseling with the patient and coordination of care, physical exam, and documentation of the encounter. ____________________________________   Leeroy Due, PA-C   Lynwood CHARM Nasuti, PhD, MD   [facility]  Radiation Oncology Direct Dial: [phone]  Fax: [phone] [email or web address]    This document serves as a record of services personally performed by Lynwood Nasuti, MD. It was created on his behalf by Reymundo Cartwright, a trained medical scribe. The creation of this record is based on the scribe's personal observations and the provider's statements to them. This document has been checked and approved by the attending provider.

## 2024-09-20 ENCOUNTER — Encounter: Payer: Self-pay | Admitting: Orthopaedic Surgery

## 2024-09-20 ENCOUNTER — Ambulatory Visit: Admitting: Orthopaedic Surgery

## 2024-09-20 ENCOUNTER — Other Ambulatory Visit (INDEPENDENT_AMBULATORY_CARE_PROVIDER_SITE_OTHER): Payer: Self-pay

## 2024-09-20 DIAGNOSIS — M1711 Unilateral primary osteoarthritis, right knee: Secondary | ICD-10-CM | POA: Diagnosis not present

## 2024-09-20 DIAGNOSIS — G8929 Other chronic pain: Secondary | ICD-10-CM

## 2024-09-20 DIAGNOSIS — Z96652 Presence of left artificial knee joint: Secondary | ICD-10-CM

## 2024-09-20 DIAGNOSIS — M25561 Pain in right knee: Secondary | ICD-10-CM | POA: Diagnosis not present

## 2024-09-20 MED ORDER — LIDOCAINE HCL 1 % IJ SOLN
3.0000 mL | INTRAMUSCULAR | Status: AC | PRN
  Administered 2024-09-20: 3 mL

## 2024-09-20 MED ORDER — METHYLPREDNISOLONE ACETATE 40 MG/ML IJ SUSP
40.0000 mg | INTRAMUSCULAR | Status: AC | PRN
  Administered 2024-09-20: 40 mg via INTRA_ARTICULAR

## 2024-09-20 NOTE — Progress Notes (Signed)
 The patient is now 8 months status post a left total knee arthroplasty.  She says that knee is doing well.  She does have known arthritis of her right knee.  She is 83 years old and very active.  She would like to have a steroid injection in the right knee today.  She is not ready for knee replacement surgery as of yet for the right knee.  She does see our partner Dr. Georgina coming up for her neck.  Examination of her left knee shows she has excellent flexion and extension of that left knee and is ligamentously stable.  Her right knee has varus malalignment with medial joint line tenderness and patellofemoral crepitation and it hurts throughout the arc of motion.  Standing AP and lateral left knee shows a well-seated left total knee arthroplasty with no complicating features.  The alignment is neutral.  The right knee on the AP view shows varus malalignment and bone-on-bone wear of the medial compartment of the knee.  Per request I did place a steroid injection in the right knee which she tolerated well.  Follow-up for her left knee can be as needed.  She knows the earliest that we can provide steroid injections in the joint is about 3 months apart.  If he gets to the point where she would like to consider knee replacement surgery on the right side she knows to contact us .  Of note she does have a remote history of both her hips being replaced.  If those become problematic and she needs to be seen for her hips she will let us  know.    Procedure Note  Patient: Kristin Carlson             Date of Birth: 02-Dec-1940           MRN: [id number]             Visit Date: 09/20/2024  Procedures: Visit Diagnoses:  1. History of left knee replacement   2. Chronic pain of right knee   3. Unilateral primary osteoarthritis, right knee     Large Joint Inj: R knee on 09/20/2024 1:20 PM Indications: diagnostic evaluation and pain Details: 22 G 1.5 in needle, superolateral approach  Arthrogram: No  Medications:  3 mL lidocaine  1 %; 40 mg methylPREDNISolone  acetate 40 MG/ML Outcome: tolerated well, no immediate complications Procedure, treatment alternatives, risks and benefits explained, specific risks discussed. Consent was given by the patient. Immediately prior to procedure a time out was called to verify the correct patient, procedure, equipment, support staff and site/side marked as required. Patient was prepped and draped in the usual sterile fashion.

## 2024-09-21 ENCOUNTER — Ambulatory Visit: Admitting: Obstetrics and Gynecology

## 2024-10-02 ENCOUNTER — Encounter: Payer: Self-pay | Admitting: Radiology

## 2024-10-18 ENCOUNTER — Other Ambulatory Visit: Payer: Self-pay

## 2024-10-18 ENCOUNTER — Ambulatory Visit: Admitting: Orthopedic Surgery

## 2024-10-18 DIAGNOSIS — M542 Cervicalgia: Secondary | ICD-10-CM

## 2024-10-18 NOTE — Progress Notes (Signed)
 Orthopedic Spine Surgery Office Note  Assessment: Patient is a 83 y.o. female with neck pian and temporal headaches. 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  She is not a fan of taking pills.  She has a concern about potential side effects -Recommended diagnostic/therapeutic facet injections at C3/4 based on where her pain is and the fact she is having headaches that is associated with her neck pain -If she gets good relief with the facet injection, she could potentially do RFA in the future -Patient should return to office on an as needed basis   Patient expressed understanding of the plan and all questions were answered to the patient's satisfaction.   ___________________________________________________________________________   History:  Patient is a 83 y.o. female who presents today for cervical spine.  Patient has now had over a year of neck pain.  She feels in the upper cervical spine.  She notes when the pain is significant she will have temporal headaches as well.  She has no pain radiating into either upper extremity.  No symptoms of imbalance or unsteadiness.  No difficulty with fine motor skills in the hand.  There is no trauma or injury that preceded the onset of the pain.  Pain has been getting progressively worse with time.  Treatments tried: none   Physical Exam:  General: no acute distress, appears stated age Neurologic: alert, answering questions appropriately, following commands Respiratory: unlabored breathing on room air, symmetric chest rise Psychiatric: appropriate affect, normal cadence to speech   MSK (spine):  -Strength exam      Left  Right Grip strength                5/5  5/5 Interosseus   5/5   5/5 Wrist extension  5/5  5/5 Wrist flexion   5/5  5/5 Elbow flexion   5/5  5/5 Deltoid    5/5  5/5  -Sensory exam    Sensation intact to light touch in C5-T1 nerve  distributions of bilateral upper extremities  Imaging: XRs of the cervical spine from 10/18/2024 were independently reviewed and interpreted, showing anterior instrumentation from C4-C6.  No lucency seen around the screws.  There appears to be fusion mass spanning the former C4/5 and C5/6 disc bases.  Disc height loss with anterior osteophyte formation at C3/4 and C6/7.  No evidence of instability on flexion/extension views.  Positive cervical sagittal balance.  No fracture or dislocation seen.  CT of the cervical spine from 12/22/2023 was independently reviewed and interpreted, showing no fracture or dislocation.  Disc height loss at C3/4 and C6/7.  Facet arthropathy at C3/4, C6/7, C7/T1.  There is anterior instrumentation from C4-C6.  No significant lucency seen around the screws.  Interbody devices at the C4/5 and C5/6 disc spaces.  There appears to be fusion mass across those former disc bases.   Patient name: Kristin Carlson Patient MRN: [id number] Date of visit: 10/18/24

## 2024-11-08 ENCOUNTER — Encounter: Payer: Self-pay | Admitting: Physical Medicine and Rehabilitation

## 2024-11-08 ENCOUNTER — Ambulatory Visit: Admitting: Physical Medicine and Rehabilitation

## 2024-11-08 DIAGNOSIS — M47812 Spondylosis without myelopathy or radiculopathy, cervical region: Secondary | ICD-10-CM

## 2024-11-08 DIAGNOSIS — M961 Postlaminectomy syndrome, not elsewhere classified: Secondary | ICD-10-CM

## 2024-11-08 DIAGNOSIS — M47819 Spondylosis without myelopathy or radiculopathy, site unspecified: Secondary | ICD-10-CM

## 2024-11-08 DIAGNOSIS — M542 Cervicalgia: Secondary | ICD-10-CM | POA: Diagnosis not present

## 2024-11-08 NOTE — Progress Notes (Unsigned)
 Kristin Carlson - 83 y.o. female MRN [id number]  Date of birth: [birth year]  Office Visit Note: Visit Date: 11/08/2024 PCP: Camelia Sherwood BIRCH, FNP Referred by: Camelia Sherwood BIRCH, FNP  Subjective: Chief Complaint  Patient presents with   Neck - Pain   HPI: Kristin Carlson is a 83 y.o. female who comes in today per the request of Dr. Ozell Ada for evaluation of chronic, worsening and severe bilateral neck pain. Pain ongoing for several years, worsened about 1 year ago. Her pain severe with side to side rotation of her neck. She reports decreased range of motion likely due to pain, also reports issues with headaches. She describes pain as dull and aching sensation, currently rates as 7 out of 10. Some relief of pain with home exercise regimen, heating pad, topical pain creams, rest and use of medications. CT of cervical spine shows anterior fusion hardware C4 through C6. Advanced degenerative changes at C3-C4 and C6-C7. History of ACDF 25 plus years ago with Dr. Robert Lacin in [place], [place]. History of cervical epidural steroid injections prior to surgery with some relief of pain. Patient denies focal weakness, numbness and tingling. No issues with fine motor skills. No recent trauma or falls.       Review of Systems  Musculoskeletal:  Positive for neck pain.  Neurological:  Negative for tingling, sensory change, focal weakness and weakness.  All other systems reviewed and are negative.  Otherwise per HPI.  Assessment & Plan: Visit Diagnoses:    ICD-10-CM   1. 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 Post laminectomy syndrome  M96.1        Plan: Findings:  Chronic, worsening and severe bilateral neck pain. No radicular symptoms down the arms. Patient continues to have severe pain despite good conservative therapies such as home exercise regimen, rest and use of medications. Patients  clinical presentation and exam are consistent with facet mediated pain. There is facet arthropathy at the level of C3-C4 that I believe could be symptomatic. Her pain worsens with side to side rotation of neck. We discussed treatment plan in detail today. Next step is to perform diagnostic bilateral C3-C4 medial branch blocks under fluoroscopic guidance. If good relief of pain with diagnostic blocks we discussed potential for longer sustained pain relief with radiofrequency ablation. I discussed injection/ablation procedure with patient today, she has no questions at this time. We will see her back for first set of medial branch blocks. Her exam today is non focal, good strength noted to bilateral upper extremities.     Meds & Orders: No orders of the defined types were placed in this encounter.  No orders of the defined types were placed in this encounter.   Follow-up: Return for Bilateral C3-C4 medial branch blocks.   Procedures: No procedures performed      Clinical History: Narrative & Impression CLINICAL DATA:  Fall tripped on rug   EXAM: CT HEAD WITHOUT CONTRAST   CT CERVICAL SPINE WITHOUT CONTRAST   TECHNIQUE: Multidetector CT imaging of the head and cervical spine was performed following the standard protocol without intravenous contrast. Multiplanar CT image reconstructions of the cervical spine were also generated.   RADIATION DOSE REDUCTION: This exam was performed according to the departmental dose-optimization program which includes automated exposure control, adjustment of the mA and/or kV according to patient size and/or use of  iterative reconstruction technique.   COMPARISON:  None Available.   FINDINGS: CT HEAD FINDINGS   Brain: No acute territorial infarction, hemorrhage or intracranial mass. Moderate white matter hypodensity consistent with chronic small vessel ischemic change. Mild atrophy. Non enlarged ventricles   Vascular: No hyperdense vessels.   Carotid vascular calcification   Skull: Normal. Negative for fracture or focal lesion.   Sinuses/Orbits: No acute finding.   Other: None   CT CERVICAL SPINE FINDINGS   Alignment: 3 mm retrolisthesis C3 on C4. Facet alignment is maintained.   Skull base and vertebrae: Craniovertebral junction is intact. Vertebral body heights are maintained. Small osseous densities anterior and posterior to the left C1-C2 articulation appear corticated and could reflect remote injury or nonspecific calcification. No definite acute fracture is seen. Incomplete fusion of the posterior arch of C1.   Soft tissues and spinal canal: No prevertebral fluid or swelling. No visible canal hematoma.   Disc levels: Anterior fusion hardware C4 through C6 with interbody devices and solid bone fusion. Advanced disc space narrowing and degenerative change at C3-C4 and C6-C7. Moderate severe bilateral foraminal narrowing at C3-C4 and C6-C7.   Upper chest: Left apical lung nodule with fiducial marker again visualized. This measures about 14 x 12 mm. Right apical scarring.   Other: None   IMPRESSION: 1. No CT evidence for acute intracranial abnormality. 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 2. Anterior fusion hardware C4 through C6. Advanced degenerative changes at C3-C4 and C6-C7. No definite acute osseous abnormality.     Electronically Signed   By: Luke Bun M.D.   On: 12/22/2023 23:54   She reports that she quit smoking about 57 years ago. Her smoking use included cigarettes. She started smoking about 60 years ago. She has a 1.5 pack-year smoking history. She has never used smokeless tobacco. No results for input(s): HGBA1C, LABURIC in the last 8760 hours.  Objective:  VS:  HT:    WT:   BMI:     BP:   HR: bpm  TEMP: ( )  RESP:  Physical Exam Vitals and nursing note reviewed.  HENT:     Head: Normocephalic and atraumatic.     Right Ear: External ear  normal.     Left Ear: External ear normal.     Nose: Nose normal.     Mouth/Throat:     Mouth: Mucous membranes are moist.  Eyes:     Extraocular Movements: Extraocular movements intact.  Cardiovascular:     Rate and Rhythm: Normal rate.     Pulses: Normal pulses.  Pulmonary:     Effort: Pulmonary effort is normal.  Abdominal:     General: Abdomen is flat. There is no distension.  Musculoskeletal:        General: Tenderness present.     Cervical back: Normal range of motion.     Comments: Pain noted with side to side rotation of her neck. Patient has good strength in the upper extremities including 5 out of 5 strength in wrist extension, long finger flexion and APB. Shoulder range of motion is full bilaterally without any sign of impingement. There is no atrophy of the hands intrinsically. Sensation intact bilaterally. Negative Hoffman's sign. Negative Spurling's sign.     Skin:    General: Skin is warm and dry.     Capillary Refill: Capillary refill takes less than 2 seconds.  Neurological:     General: No focal deficit present.     Mental  Status: She is alert and oriented to person, place, and time.  Psychiatric:        Mood and Affect: Mood normal.        Behavior: Behavior normal.     Ortho Exam  Imaging: No results found.  Past Medical/Family/Surgical/Social History: Medications & Allergies reviewed per EMR, new medications updated. 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 atypical basaloid cells   Basal cell carcinoma 03/04/2023   Right lateral cheek. Nodular. Referral to radiation oncology.   BCC (basal cell carcinoma of skin) 12/03/2022   superficial right superior shoulder, 03/04/23  ED&C   Family history of lung cancer    GERD (gastroesophageal reflux disease)    History of basal cell carcinoma 07/29/2021   left thigh, EDC at time of bx   History of kidney stones    on CT   History of radiation therapy    vaginal brachytherapy [facility] 12/18/2021-01/13/2022 Dr Lynwood Nasuti   HLD (hyperlipidemia)    Hypertension    Hypothyroidism    Lymphedema    Pneumonia    HX OF YEARS AGO   PONV (postoperative nausea and vomiting)    Family History  Problem Relation Age of Onset   Cancer Father        Lung Cancer   Lung cancer Father        smoked   Skin cancer Maternal Grandmother    Skin cancer Paternal Grandmother    Cancer Paternal Aunt        mouth   Colon cancer Neg Hx    Breast cancer Neg Hx    Ovarian cancer Neg Hx    Endometrial cancer Neg Hx    Pancreatic cancer Neg Hx    Prostate cancer Neg Hx    Past Surgical History:  Procedure Laterality Date   BREAST BIOPSY Left 11/27/2021   u/s bs, venus clip,  INVASIVE MAMMARY CARCINOMA   BREAST CYST ASPIRATION     BREAST LUMPECTOMY Left 06/16/2022   BREAST LUMPECTOMY WITH RADIOACTIVE SEED LOCALIZATION Left 06/16/2022   Procedure: LEFT BREAST LUMPECTOMY WITH RADIOACTIVE SEED  LOCALIZATION;  Surgeon: Ebbie Cough, MD;  Location: [facility];  Service: General;  Laterality: Left;   BRONCHIAL BIOPSY  07/10/2022   Procedure: BRONCHIAL BIOPSIES;  Surgeon: Gladis Leonor HERO, MD;  Location: [facility] ENDOSCOPY;  Service: Pulmonary;;   BRONCHIAL BRUSHINGS  07/10/2022   Procedure: BRONCHIAL BRUSHINGS;  Surgeon: Gladis Leonor HERO, MD;  Location: [facility]  ENDOSCOPY;  Service: Pulmonary;;   BRONCHIAL NEEDLE ASPIRATION BIOPSY  07/10/2022   Procedure: BRONCHIAL NEEDLE ASPIRATION BIOPSIES;  Surgeon: Gladis Leonor HERO, MD;  Location: [facility] ENDOSCOPY;  Service: Pulmonary;;   BRONCHIAL WASHINGS  07/10/2022   Procedure: BRONCHIAL WASHINGS;  Surgeon: Gladis Leonor HERO, MD;  Location: [facility] ENDOSCOPY;  Service: Pulmonary;;   cataract Bilateral    FIDUCIAL MARKER PLACEMENT  07/10/2022   Procedure: FIDUCIAL MARKER PLACEMENT;  Surgeon: Gladis Leonor HERO, MD;  Location: [facility] ENDOSCOPY;  Service: Pulmonary;;   HIP SURGERY Bilateral    IR IMAGING GUIDED PORT INSERTION  11/07/2021   IR REMOVAL TUN ACCESS W/ PORT W/O FL MOD SED  01/06/2022   KNEE ARTHROSCOPY Left 2003   NECK SURGERY  1997   Fusion C3, C4, C5   ROBOTIC ASSISTED TOTAL HYSTERECTOMY WITH BILATERAL SALPINGO OOPHERECTOMY Bilateral 10/14/2021   Procedure: XI ROBOTIC ASSISTED TOTAL HYSTERECTOMY WITH BILATERAL SALPINGO OOPHORECTOMY, LYMPH NODE DISSECTION,  LAPAROTOMY;  Surgeon: Viktoria Comer SAUNDERS, MD;  Location: [facility];  Service: Gynecology;  Laterality: Bilateral;   SENTINEL NODE BIOPSY N/A 10/14/2021   Procedure: SENTINEL NODE DISSECTION;  Surgeon: Viktoria Comer SAUNDERS, MD;  Location: [facility];  Service: Gynecology;  Laterality: N/A;   THYROIDECTOMY  2007   TOTAL HIP ARTHROPLASTY Bilateral    Left Hip -2015, Right Hip 208   TOTAL KNEE ARTHROPLASTY Left 01/13/2024   Procedure: LEFT TOTAL KNEE ARTHROPLASTY;  Surgeon: Vernetta Lonni GRADE, MD;  Location: [facility];  Service: Orthopedics;  Laterality: Left;   VIDEO BRONCHOSCOPY WITH  RADIAL ENDOBRONCHIAL ULTRASOUND  07/10/2022   Procedure: RADIAL ENDOBRONCHIAL ULTRASOUND;  Surgeon: Gladis Leonor HERO, MD;  Location: [facility] ENDOSCOPY;  Service: Pulmonary;;   Social History   Occupational History   Not on file  Tobacco Use   Smoking status: Former    Current packs/day: 0.00    Average packs/day: 0.5 packs/day for 3.0 years (1.5 ttl pk-yrs)    Types: Cigarettes    Start date: 12/01/1963    Quit date: 11/30/1966    Years since quitting: 57.9   Smokeless tobacco: Never  Vaping Use   Vaping status: Never Used  Substance and Sexual Activity   Alcohol use: Yes    Comment: RARE   Drug use: Never   Sexual activity: Not Currently

## 2024-11-08 NOTE — Progress Notes (Unsigned)
 Pain Scale   Average Pain 6 Patient advising she has neck pain when turning her head that radiates to bilateral shoulders.        +Driver, -BT, -Dye Allergies.

## 2024-12-05 ENCOUNTER — Other Ambulatory Visit: Payer: Self-pay

## 2024-12-05 ENCOUNTER — Encounter: Payer: Self-pay | Admitting: Physical Medicine and Rehabilitation

## 2024-12-05 ENCOUNTER — Ambulatory Visit: Admitting: Physical Medicine and Rehabilitation

## 2024-12-05 VITALS — BP 163/84 | HR 68

## 2024-12-05 DIAGNOSIS — M47812 Spondylosis without myelopathy or radiculopathy, cervical region: Secondary | ICD-10-CM | POA: Diagnosis not present

## 2024-12-05 MED ORDER — BUPIVACAINE HCL 0.25 % IJ SOLN
2.0000 mL | Freq: Once | INTRAMUSCULAR | Status: AC
  Administered 2024-12-05: 2 mL

## 2024-12-05 NOTE — Progress Notes (Signed)
 Pain Scale   Average Pain 8 Patient advising she has neck pain radiating to bilateral shoulders. Pain comes and goes with movement        +Driver, -BT, -Dye Allergies.

## 2024-12-06 ENCOUNTER — Encounter: Payer: TRICARE For Life (TFL) | Admitting: Dermatology

## 2024-12-06 ENCOUNTER — Other Ambulatory Visit: Payer: Self-pay | Admitting: Physical Medicine and Rehabilitation

## 2024-12-06 DIAGNOSIS — M47812 Spondylosis without myelopathy or radiculopathy, cervical region: Secondary | ICD-10-CM

## 2024-12-06 DIAGNOSIS — M542 Cervicalgia: Secondary | ICD-10-CM

## 2024-12-07 ENCOUNTER — Encounter: Payer: Self-pay | Admitting: Dermatology

## 2024-12-07 ENCOUNTER — Ambulatory Visit: Admitting: Dermatology

## 2024-12-07 DIAGNOSIS — W908XXA Exposure to other nonionizing radiation, initial encounter: Secondary | ICD-10-CM | POA: Diagnosis not present

## 2024-12-07 DIAGNOSIS — L821 Other seborrheic keratosis: Secondary | ICD-10-CM | POA: Diagnosis not present

## 2024-12-07 DIAGNOSIS — Z1283 Encounter for screening for malignant neoplasm of skin: Secondary | ICD-10-CM

## 2024-12-07 DIAGNOSIS — D229 Melanocytic nevi, unspecified: Secondary | ICD-10-CM

## 2024-12-07 DIAGNOSIS — L814 Other melanin hyperpigmentation: Secondary | ICD-10-CM | POA: Diagnosis not present

## 2024-12-07 DIAGNOSIS — L578 Other skin changes due to chronic exposure to nonionizing radiation: Secondary | ICD-10-CM | POA: Diagnosis not present

## 2024-12-07 DIAGNOSIS — Z85828 Personal history of other malignant neoplasm of skin: Secondary | ICD-10-CM

## 2024-12-07 DIAGNOSIS — D1801 Hemangioma of skin and subcutaneous tissue: Secondary | ICD-10-CM | POA: Diagnosis not present

## 2024-12-07 DIAGNOSIS — L820 Inflamed seborrheic keratosis: Secondary | ICD-10-CM | POA: Diagnosis not present

## 2024-12-07 DIAGNOSIS — L905 Scar conditions and fibrosis of skin: Secondary | ICD-10-CM | POA: Diagnosis not present

## 2024-12-07 DIAGNOSIS — L719 Rosacea, unspecified: Secondary | ICD-10-CM

## 2024-12-07 NOTE — Patient Instructions (Signed)

## 2024-12-07 NOTE — Progress Notes (Signed)
 "  Follow-Up Visit   Subjective  Kristin Carlson is a 84 y.o. female who presents for the following: Skin Cancer Screening and Full Body Skin Exam  The patient presents for Total-Body Skin Exam (TBSE) for skin cancer screening and mole check. The patient has spots, moles and lesions to be evaluated, some may be new or changing and the patient may have concern these could be cancer.  Hx BCC. Patient with a spot at left thigh that she picks at. A spot at face, present for a long time.  The following portions of the chart were reviewed this encounter and updated as appropriate: medications, allergies, medical history  Review of Systems:  No other skin or systemic complaints except as noted in HPI or Assessment and Plan.  Objective  Well appearing patient in no apparent distress; mood and affect are within normal limits.  A full examination was performed including scalp, head, eyes, ears, nose, lips, neck, chest, axillae, abdomen, back, buttocks, bilateral upper extremities, bilateral lower extremities, hands, feet, fingers, toes, fingernails, and toenails. All findings within normal limits unless otherwise noted below.   Relevant physical exam findings are noted in the Assessment and Plan.  Left Thigh - Anterior Erythematous stuck-on, waxy papule or plaque  Assessment & Plan   SKIN CANCER SCREENING PERFORMED TODAY.  ACTINIC DAMAGE - Chronic condition, secondary to cumulative UV/sun exposure - diffuse scaly erythematous macules with underlying dyspigmentation - Recommend daily broad spectrum sunscreen SPF 30+ to sun-exposed areas, reapply every 2 hours as needed.  - Staying in the shade or wearing long sleeves, sun glasses (UVA+UVB protection) and wide brim hats (4-inch brim around the entire circumference of the hat) are also recommended for sun protection.  - Call for new or changing lesions.  LENTIGINES, SEBORRHEIC KERATOSES, HEMANGIOMAS - Benign normal skin lesions -  Benign-appearing - Call for any changes  MELANOCYTIC NEVI - Tan-brown and/or pink-flesh-colored symmetric macules and papules - Benign appearing on exam today - Observation - Call clinic for new or changing moles - Recommend daily use of broad spectrum spf 30+ sunscreen to sun-exposed areas.   HISTORY OF BASAL CELL CARCINOMA OF THE SKIN - No evidence of recurrence today - Recommend regular full body skin exams - Recommend daily broad spectrum sunscreen SPF 30+ to sun-exposed areas, reapply every 2 hours as needed.  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 Condition is symptomatic / bothersome to patient. Not to goal.   Rosacea is a chronic progressive skin condition usually affecting the face of adults, causing redness and/or acne bumps. It is treatable but not curable. It sometimes affects the eyes (ocular rosacea) as well. It may respond to topical and/or systemic medication and can flare with stress, sun exposure, alcohol, exercise, topical steroids (including hydrocortisone /cortisone 10) and some foods.  Daily application of broad spectrum spf 30+ sunscreen to face is recommended to reduce flares.  Treatment Plan Deferred   SCAR Exam: Dyspigmented smooth macule or patch at right chest s/p port. Benign-appearing.  Observation.  Call clinic for new or changing lesions. Recommend daily broad spectrum sunscreen SPF 30+, reapply every 2 hours as needed. Treatment: Recommend Serica moisturizing scar formula cream every night or [employer] brand or Mederma silicone scar sheet every night for the first year after a scar appears to help with scar remodeling if desired.  Scars remodel on their own for a full year and will gradually improve in appearance over time. INFLAMED SEBORRHEIC KERATOSIS Left Thigh -  Anterior Symptomatic, irritating, patient would like treated.  Benign-appearing.  Call clinic for new or changing lesions.   - Destruction of lesion - Left Thigh - Anterior Complexity: simple   Destruction method: cryotherapy   Informed consent: discussed and consent obtained   Timeout:  patient name, date of birth, surgical site, and procedure verified Lesion destroyed using liquid nitrogen: Yes   Region frozen until ice ball extended beyond lesion: Yes   Cryo cycles: 1 or 2. Outcome: patient tolerated procedure well with no complications   Post-procedure details: wound care instructions given    MULTIPLE BENIGN NEVI   LENTIGINES   SEBORRHEIC KERATOSES   ACTINIC ELASTOSIS   CHERRY ANGIOMA   Return in about 1 year (around 12/07/2025) for TBSE, with Dr. Claudene, [facility].  LILLETTE Lonell Drones, RMA, am acting as scribe for Boneta Claudene, MD .   Documentation: I have reviewed the above documentation for accuracy and completeness, and I agree with the above.  Boneta Claudene, MD   "

## 2024-12-12 NOTE — Progress Notes (Signed)
 "  Kristin Carlson - 84 y.o. female MRN [id number]  Date of birth: July 30, 1941  Office Visit Note: Visit Date: 12/05/2024 PCP: Camelia Sherwood BIRCH, FNP Referred by: Camelia Sherwood BIRCH, FNP  Subjective: Chief Complaint  Patient presents with   Neck - Pain   HPI:  Kristin Carlson is a 84 y.o. female who comes in today at the request of Duwaine Pouch, FNP for planned Bilateral  C3-4 Cervical facet/medial branch block with fluoroscopic guidance.  The patient has failed conservative care including home exercise, medications, time and activity modification.  This injection will be diagnostic and hopefully therapeutic.  Please see requesting physician notes for further details and justification.  Exam has shown concordant pain with facet joint loading.   ROS Otherwise per HPI.  Assessment & Plan: Visit Diagnoses:    ICD-10-CM   1. Cervical spondylosis without myelopathy  M47.812 XR C-ARM NO REPORT    Nerve Block    bupivacaine  (MARCAINE ) 0.25 % (with pres) injection 2 mL      Plan: No additional findings.   Meds & Orders:  Meds ordered this encounter  Medications   bupivacaine  (MARCAINE ) 0.25 % (with pres) injection 2 mL    Orders Placed This Encounter  Procedures   Nerve Block   XR C-ARM NO REPORT    Follow-up: Return for Review Pain Diary.   Procedures: No procedures performed  Diagnostic Cervical Facet Joint Nerve Block with Fluoroscopic Guidance  Patient: Kristin Carlson      Date of Birth: [birth year] MRN: [id number] PCP: Camelia Sherwood BIRCH, FNP      Visit Date: 12/05/2024   Universal Protocol:    Date/Time: 1/13/20268:27 PM  Consent Given By: the patient  Position: PRONE  Additional Comments: Vital signs were monitored before and after the procedure. Patient was prepped and draped in the usual sterile fashion. The correct patient, procedure, and site was verified.   Injection Procedure Details:   Procedure diagnoses: Cervical spondylosis without myelopathy [M47.812]    Meds Administered:  Meds ordered this encounter  Medications   bupivacaine  (MARCAINE ) 0.25 % (with pres) injection 2 mL     Laterality: Bilateral  Location/Site:  C3-4  Needle size: 25 G  Needle type: Spinal  Needle Placement: Articular Pillar  Findings:  -Contrast Used: 0.5 mL iohexol  180 mg iodine /mL   -Comments: Excellent flow of contrast across the articular pillars without intravascular flow  Procedure Details: The fluoroscope beam was positioned to square off the endplates of the desired vertebral level to achieve a true AP position. The beam was then moved in a small counter oblique to the contralateral side with a small amount of caudal tilt to achieve a trajectory alignment with the desired nerves.  For each target described below the skin was anesthetized with 1 ml of 1% Lidocaine  without epinephrine .  To block the facet joint nerves from C3 through C7, the lateral masses of these respective levels were localized under fluoroscopic visualization.  A spinal needle was inserted down to the waist at the above mentioned cervical levels.  The  needle was then walked off until it rested just lateral to the trough of the lateral mass of the medial branch nerve, which innervates the cervical facet joint.   After contact with periosteum and negative aspirate for blood and CSF, correct placement without intravascular or epidural spread was confirmed by Bi-planar images and  injecting 0.5 ml. of Omnipaque -240.  A spot radiograph was obtained of this image.  Next, a 0.5  ml. volume of 1% Lidocaine  without Epinephrine  was then injected.  Prior to the procedure, the patient was given a Pain Diary which was completed for baseline measurements.  After the procedure, the patient rated their pain every 30 minutes and will continue rating at this frequency for a total of 5 hours.  The patient has been asked to complete the Diary and return to us  by mail, fax or hand delivered as soon as  possible.   Additional Comments:  The patient tolerated the procedure well Dressing: Band-Aid    Post-procedure details: Patient was observed during the procedure. Post-procedure instructions were reviewed.  Patient left the clinic in stable condition.      Clinical History: Narrative & Impression CLINICAL DATA:  Fall tripped on rug   EXAM: CT HEAD WITHOUT CONTRAST   CT CERVICAL SPINE WITHOUT CONTRAST   TECHNIQUE: Multidetector CT imaging of the head and cervical spine was performed following the standard protocol without intravenous contrast. Multiplanar CT image reconstructions of the cervical spine were also generated.   RADIATION DOSE REDUCTION: This exam was performed according to the departmental dose-optimization program which includes automated exposure control, adjustment of the mA and/or kV according to patient size and/or use of iterative reconstruction technique.   COMPARISON:  None Available.   FINDINGS: CT HEAD FINDINGS   Brain: No acute territorial infarction, hemorrhage or intracranial mass. Moderate white matter hypodensity consistent with chronic small vessel ischemic change. Mild atrophy. Non enlarged ventricles   Vascular: No hyperdense vessels.  Carotid vascular calcification   Skull: Normal. Negative for fracture or focal lesion.   Sinuses/Orbits: No acute finding.   Other: None   CT CERVICAL SPINE FINDINGS   Alignment: 3 mm retrolisthesis C3 on C4. Facet alignment is maintained.   Skull base and vertebrae: Craniovertebral junction is intact. Vertebral body heights are maintained. Small osseous densities anterior and posterior to the left C1-C2 articulation appear corticated and could reflect remote injury or nonspecific calcification. No definite acute fracture is seen. Incomplete fusion of the posterior arch of C1.   Soft tissues and spinal canal: No prevertebral fluid or swelling. No visible canal hematoma.   Disc levels:  Anterior fusion hardware C4 through C6 with interbody devices and solid bone fusion. Advanced disc space narrowing and degenerative change at C3-C4 and C6-C7. Moderate severe bilateral foraminal narrowing at C3-C4 and C6-C7.   Upper chest: Left apical lung nodule with fiducial marker again visualized. This measures about 14 x 12 mm. Right apical scarring.   Other: None   IMPRESSION: 1. No CT evidence for acute intracranial abnormality. 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 2. Anterior fusion hardware C4 through C6. Advanced degenerative changes at C3-C4 and C6-C7. No definite acute osseous abnormality.     Electronically Signed   By: Luke Bun M.D.   On: 12/22/2023 23:54     Objective:  VS:  HT:    WT:   BMI:     BP:(!) 163/84  HR:68bpm  TEMP: ( )  RESP:  Physical Exam Vitals and nursing note reviewed.  Constitutional:      General: She is not in acute distress.    Appearance: Normal appearance. She is not ill-appearing.  HENT:     Head: Normocephalic and atraumatic.     Right Ear: External ear normal.     Left Ear: External ear normal.  Eyes:     Extraocular Movements: Extraocular movements intact.  Cardiovascular:     Rate and  Rhythm: Normal rate.     Pulses: Normal pulses.  Musculoskeletal:     Cervical back: Tenderness present. No rigidity.     Right lower leg: No edema.     Left lower leg: No edema.     Comments: Patient has good strength in the upper extremities including 5 out of 5 strength in wrist extension long finger flexion and APB.  There is no atrophy of the hands intrinsically.  There is a negative Hoffmann's test.   Lymphadenopathy:     Cervical: No cervical adenopathy.  Skin:    Findings: No erythema, lesion or rash.  Neurological:     General: No focal deficit present.     Mental Status: She is alert and oriented to person, place, and time.     Sensory: No sensory deficit.     Motor: No weakness or  abnormal muscle tone.     Coordination: Coordination normal.  Psychiatric:        Mood and Affect: Mood normal.        Behavior: Behavior normal.      Imaging: No results found. "

## 2024-12-12 NOTE — Procedures (Signed)
 Diagnostic Cervical Facet Joint Nerve Block with Fluoroscopic Guidance  Patient: Kristin Carlson      Date of Birth: 25-Dec-1940 MRN: [id number] PCP: Camelia Sherwood BIRCH, FNP      Visit Date: 12/05/2024   Universal Protocol:    Date/Time: 1/13/20268:27 PM  Consent Given By: the patient  Position: PRONE  Additional Comments: Vital signs were monitored before and after the procedure. Patient was prepped and draped in the usual sterile fashion. The correct patient, procedure, and site was verified.   Injection Procedure Details:   Procedure diagnoses: Cervical spondylosis without myelopathy [M47.812]   Meds Administered:  Meds ordered this encounter  Medications   bupivacaine  (MARCAINE ) 0.25 % (with pres) injection 2 mL     Laterality: Bilateral  Location/Site:  C3-4  Needle size: 25 G  Needle type: Spinal  Needle Placement: Articular Pillar  Findings:  -Contrast Used: 0.5 mL iohexol  180 mg iodine /mL   -Comments: Excellent flow of contrast across the articular pillars without intravascular flow  Procedure Details: The fluoroscope beam was positioned to square off the endplates of the desired vertebral level to achieve a true AP position. The beam was then moved in a small counter oblique to the contralateral side with a small amount of caudal tilt to achieve a trajectory alignment with the desired nerves.  For each target described below the skin was anesthetized with 1 ml of 1% Lidocaine  without epinephrine .  To block the facet joint nerves from C3 through C7, the lateral masses of these respective levels were localized under fluoroscopic visualization.  A spinal needle was inserted down to the waist at the above mentioned cervical levels.  The  needle was then walked off until it rested just lateral to the trough of the lateral mass of the medial branch nerve, which innervates the cervical facet joint.   After contact with periosteum and negative aspirate for blood  and CSF, correct placement without intravascular or epidural spread was confirmed by Bi-planar images and  injecting 0.5 ml. of Omnipaque -240.  A spot radiograph was obtained of this image.  Next, a 0.5 ml. volume of 1% Lidocaine  without Epinephrine  was then injected.  Prior to the procedure, the patient was given a Pain Diary which was completed for baseline measurements.  After the procedure, the patient rated their pain every 30 minutes and will continue rating at this frequency for a total of 5 hours.  The patient has been asked to complete the Diary and return to us  by mail, fax or hand delivered as soon as possible.   Additional Comments:  The patient tolerated the procedure well Dressing: Band-Aid    Post-procedure details: Patient was observed during the procedure. Post-procedure instructions were reviewed.  Patient left the clinic in stable condition.

## 2024-12-18 ENCOUNTER — Ambulatory Visit: Admitting: Obstetrics and Gynecology

## 2024-12-19 ENCOUNTER — Inpatient Hospital Stay: Payer: TRICARE For Life (TFL) | Attending: Hematology and Oncology

## 2024-12-19 ENCOUNTER — Ambulatory Visit: Admitting: Obstetrics and Gynecology

## 2024-12-19 ENCOUNTER — Encounter: Payer: Self-pay | Admitting: Hematology and Oncology

## 2024-12-19 ENCOUNTER — Inpatient Hospital Stay: Payer: TRICARE For Life (TFL) | Admitting: Hematology and Oncology

## 2024-12-19 VITALS — BP 149/64 | HR 65 | Temp 99.2°F | Resp 18 | Ht 64.0 in | Wt 191.4 lb

## 2024-12-19 DIAGNOSIS — C541 Malignant neoplasm of endometrium: Secondary | ICD-10-CM | POA: Diagnosis not present

## 2024-12-19 DIAGNOSIS — C50412 Malignant neoplasm of upper-outer quadrant of left female breast: Secondary | ICD-10-CM

## 2024-12-19 LAB — COMPREHENSIVE METABOLIC PANEL WITH GFR
ALT: 16 U/L (ref 0–44)
AST: 27 U/L (ref 15–41)
Albumin: 4 g/dL (ref 3.5–5.0)
Alkaline Phosphatase: 75 U/L (ref 38–126)
Anion gap: 9 (ref 5–15)
BUN: 19 mg/dL (ref 8–23)
CO2: 28 mmol/L (ref 22–32)
Calcium: 9.1 mg/dL (ref 8.9–10.3)
Chloride: 103 mmol/L (ref 98–111)
Creatinine, Ser: 0.97 mg/dL (ref 0.44–1.00)
GFR, Estimated: 58 mL/min — ABNORMAL LOW
Glucose, Bld: 111 mg/dL — ABNORMAL HIGH (ref 70–99)
Potassium: 4.3 mmol/L (ref 3.5–5.1)
Sodium: 140 mmol/L (ref 135–145)
Total Bilirubin: 0.3 mg/dL (ref 0.0–1.2)
Total Protein: 6.8 g/dL (ref 6.5–8.1)

## 2024-12-19 LAB — CBC WITH DIFFERENTIAL/PLATELET
Abs Immature Granulocytes: 0.01 K/uL (ref 0.00–0.07)
Basophils Absolute: 0 K/uL (ref 0.0–0.1)
Basophils Relative: 1 %
Eosinophils Absolute: 0.1 K/uL (ref 0.0–0.5)
Eosinophils Relative: 2 %
HCT: 40.5 % (ref 36.0–46.0)
Hemoglobin: 13.5 g/dL (ref 12.0–15.0)
Immature Granulocytes: 0 %
Lymphocytes Relative: 23 %
Lymphs Abs: 1.2 K/uL (ref 0.7–4.0)
MCH: 30.4 pg (ref 26.0–34.0)
MCHC: 33.3 g/dL (ref 30.0–36.0)
MCV: 91.2 fL (ref 80.0–100.0)
Monocytes Absolute: 0.5 K/uL (ref 0.1–1.0)
Monocytes Relative: 11 %
Neutro Abs: 3.2 K/uL (ref 1.7–7.7)
Neutrophils Relative %: 63 %
Platelets: 189 K/uL (ref 150–400)
RBC: 4.44 MIL/uL (ref 3.87–5.11)
RDW: 12.8 % (ref 11.5–15.5)
WBC: 5 K/uL (ref 4.0–10.5)
nRBC: 0 % (ref 0.0–0.2)

## 2024-12-19 NOTE — Progress Notes (Signed)
 [facility] OFFICE PROGRESS NOTE  Patient Care Team: Camelia Sherwood BIRCH, FNP as PCP - General (Nurse Practitioner)  Assessment & Plan Endometrial cancer [facility]) She has remote history of uterine adenocarcinoma, diagnosed in October 2022, status post surgery and completion of adjuvant chemotherapy by March 2023 Final pathology: High-grade serous, HER2/neu positive, MMR IHC intact Her last imaging study from January 2025 showed no evidence of recurrent disease  We will space out her future appointment and discontinue surveillance imaging Malignant neoplasm of upper-outer quadrant of left female breast, unspecified estrogen receptor status (HCC) She was diagnosed with left breast cancer status post left breast lumpectomy and July 2023  She received a brief course of adjuvant antiestrogen therapy with aromatase inhibitor but discontinued due to side effects She is on active surveillance Breast exam today is normal Her next breast imaging will be in June 2026 I will see her again in 6 months  No orders of the defined types were placed in this encounter.    Almarie Bedford, MD  INTERVAL HISTORY: she returns for surveillance follow-up for history of uterine cancer and breast cancer She is doing well She has no abdominal symptoms She denies any recent abnormal breast examination, palpable mass, abnormal breast appearance or nipple changes   PHYSICAL EXAMINATION: ECOG PERFORMANCE STATUS: 0 - Asymptomatic  Vitals:   12/19/24 1053  BP: (!) 149/64  Pulse: 65  Resp: 18  Temp: 99.2 F (37.3 C)  SpO2: 100%   Filed Weights   12/19/24 1053  Weight: 191 lb 6.4 oz (86.8 kg)   GENERAL:alert, no distress and comfortable SKIN: skin color, texture, turgor are normal, no rashes or significant lesions EYES: normal, conjunctiva are pink and non-injected, sclera clear OROPHARYNX:no exudate, no erythema and lips, buccal mucosa, and tongue normal  NECK: supple, thyroid normal size,  non-tender, without nodularity LYMPH:  no palpable lymphadenopathy in the cervical, axillary or inguinal LUNGS: clear to auscultation and percussion with normal breathing effort HEART: regular rate & rhythm and no murmurs and no lower extremity edema ABDOMEN:abdomen soft, non-tender and normal bowel sounds Musculoskeletal:no cyanosis of digits and no clubbing  PSYCH: alert & oriented x 3 with fluent speech NEURO: no focal motor/sensory deficits Bilateral breast examination revealed no abnormalities Relevant data reviewed during this visit included CBC and CMP

## 2024-12-19 NOTE — Assessment & Plan Note (Addendum)
 She was diagnosed with left breast cancer status post left breast lumpectomy and July 2023  She received a brief course of adjuvant antiestrogen therapy with aromatase inhibitor but discontinued due to side effects She is on active surveillance Breast exam today is normal Her next breast imaging will be in June 2026 I will see her again in 6 months

## 2024-12-19 NOTE — Assessment & Plan Note (Addendum)
 She has remote history of uterine adenocarcinoma, diagnosed in October 2022, status post surgery and completion of adjuvant chemotherapy by March 2023 Final pathology: High-grade serous, HER2/neu positive, MMR IHC intact Her last imaging study from January 2025 showed no evidence of recurrent disease  We will space out her future appointment and discontinue surveillance imaging

## 2024-12-25 ENCOUNTER — Encounter: Admitting: Physical Medicine and Rehabilitation

## 2024-12-26 ENCOUNTER — Ambulatory Visit: Admitting: Physical Medicine and Rehabilitation

## 2024-12-26 ENCOUNTER — Encounter: Payer: Self-pay | Admitting: Physical Medicine and Rehabilitation

## 2024-12-26 ENCOUNTER — Other Ambulatory Visit: Payer: Self-pay

## 2024-12-26 VITALS — BP 167/90 | HR 69

## 2024-12-26 DIAGNOSIS — M47812 Spondylosis without myelopathy or radiculopathy, cervical region: Secondary | ICD-10-CM

## 2024-12-26 MED ORDER — BUPIVACAINE HCL 0.25 % IJ SOLN
2.0000 mL | Freq: Once | INTRAMUSCULAR | Status: AC
  Administered 2024-12-26: 2 mL

## 2024-12-26 NOTE — Progress Notes (Signed)
 "  Kristin Carlson - 84 y.o. female MRN [id number]  Date of birth: [birth year]  Office Visit Note: Visit Date: 12/26/2024 PCP: Camelia Sherwood BIRCH, FNP Referred by: Camelia Sherwood BIRCH, FNP  Subjective: Chief Complaint  Patient presents with   Neck - Pain   HPI:  Kristin Carlson is a 84 y.o. female who comes in today for planned repeat Bilateral C3-4 Cervical facet/medial branch block with fluoroscopic guidance.  The patient has failed conservative care including home exercise, medications, time and activity modification.  This injection will be diagnostic and hopefully therapeutic.  Please see requesting physician notes for further details and justification.  Exam shows concordant low back pain with facet joint loading and extension. Patient received more than 80% pain relief from prior injection. This would be the second block in a diagnostic double block paradigm.     Referring:Megan Trudy, FNP   ROS Otherwise per HPI.  Assessment & Plan: Visit Diagnoses:    ICD-10-CM   1. Cervical spondylosis without myelopathy  M47.812 XR C-ARM NO REPORT    Nerve Block    bupivacaine  (MARCAINE ) 0.25 % (with pres) injection 2 mL      Plan: No additional findings.   Meds & Orders:  Meds ordered this encounter  Medications   bupivacaine  (MARCAINE ) 0.25 % (with pres) injection 2 mL    Orders Placed This Encounter  Procedures   Nerve Block   XR C-ARM NO REPORT    Follow-up: Return for Review Pain Diary.   Procedures: No procedures performed      Clinical History: Narrative & Impression CLINICAL DATA:  Fall tripped on rug   EXAM: CT HEAD WITHOUT CONTRAST   CT CERVICAL SPINE WITHOUT CONTRAST   TECHNIQUE: Multidetector CT imaging of the head and cervical spine was performed following the standard protocol without intravenous contrast. Multiplanar CT image reconstructions of the cervical spine were also generated.   RADIATION DOSE REDUCTION: This exam was performed according to  the departmental dose-optimization program which includes automated exposure control, adjustment of the mA and/or kV according to patient size and/or use of iterative reconstruction technique.   COMPARISON:  None Available.   FINDINGS: CT HEAD FINDINGS   Brain: No acute territorial infarction, hemorrhage or intracranial mass. Moderate white matter hypodensity consistent with chronic small vessel ischemic change. Mild atrophy. Non enlarged ventricles   Vascular: No hyperdense vessels.  Carotid vascular calcification   Skull: Normal. Negative for fracture or focal lesion.   Sinuses/Orbits: No acute finding.   Other: None   CT CERVICAL SPINE FINDINGS   Alignment: 3 mm retrolisthesis C3 on C4. Facet alignment is maintained.   Skull base and vertebrae: Craniovertebral junction is intact. Vertebral body heights are maintained. Small osseous densities anterior and posterior to the left C1-C2 articulation appear corticated and could reflect remote injury or nonspecific calcification. No definite acute fracture is seen. Incomplete fusion of the posterior arch of C1.   Soft tissues and spinal canal: No prevertebral fluid or swelling. No visible canal hematoma.   Disc levels: Anterior fusion hardware C4 through C6 with interbody devices and solid bone fusion. Advanced disc space narrowing and degenerative change at C3-C4 and C6-C7. Moderate severe bilateral foraminal narrowing at C3-C4 and C6-C7.   Upper chest: Left apical lung nodule with fiducial marker again visualized. This measures about 14 x 12 mm. Right apical scarring.   Other: None   IMPRESSION: 1. No CT evidence for acute intracranial abnormality. Atrophy and chronic small vessel ischemic changes  of the white matter. 2. Anterior fusion hardware C4 through C6. Advanced degenerative changes at C3-C4 and C6-C7. No definite acute osseous abnormality.     Electronically Signed   By: Luke Bun M.D.   On:  12/22/2023 23:54     Objective:  VS:  HT:    WT:   BMI:     BP:(!) 167/90  HR:69bpm  TEMP: ( )  RESP:  Physical Exam Vitals and nursing note reviewed.  Constitutional:      General: She is not in acute distress.    Appearance: Normal appearance. She is not ill-appearing.  HENT:     Head: Normocephalic and atraumatic.     Right Ear: External ear normal.     Left Ear: External ear normal.  Eyes:     Extraocular Movements: Extraocular movements intact.  Cardiovascular:     Rate and Rhythm: Normal rate.     Pulses: Normal pulses.  Musculoskeletal:     Cervical back: Tenderness present. No rigidity.     Right lower leg: No edema.     Left lower leg: No edema.     Comments: Patient has good strength in the upper extremities including 5 out of 5 strength in wrist extension long finger flexion and APB.  There is no atrophy of the hands intrinsically.  There is a negative Hoffmann's test.   Lymphadenopathy:     Cervical: No cervical adenopathy.  Skin:    Findings: No erythema, lesion or rash.  Neurological:     General: No focal deficit present.     Mental Status: She is alert and oriented to person, place, and time.     Sensory: No sensory deficit.     Motor: No weakness or abnormal muscle tone.     Coordination: Coordination normal.  Psychiatric:        Mood and Affect: Mood normal.        Behavior: Behavior normal.      Imaging: XR C-ARM NO REPORT Result Date: 12/26/2024 Please see Notes tab for imaging impression.  "

## 2024-12-26 NOTE — Progress Notes (Signed)
 Pain Scale   Average Pain 8 Patient advising she has chronic neck pain that increases when moving her head at times and pain comes and goes.        +Driver, -BT, -Dye Allergies.

## 2024-12-27 ENCOUNTER — Other Ambulatory Visit: Payer: Self-pay | Admitting: Physical Medicine and Rehabilitation

## 2024-12-27 ENCOUNTER — Other Ambulatory Visit: Payer: Self-pay

## 2024-12-27 ENCOUNTER — Other Ambulatory Visit (HOSPITAL_BASED_OUTPATIENT_CLINIC_OR_DEPARTMENT_OTHER): Payer: Self-pay

## 2024-12-27 DIAGNOSIS — M47812 Spondylosis without myelopathy or radiculopathy, cervical region: Secondary | ICD-10-CM

## 2024-12-27 DIAGNOSIS — M542 Cervicalgia: Secondary | ICD-10-CM

## 2024-12-27 MED ORDER — DIAZEPAM 5 MG PO TABS
ORAL_TABLET | ORAL | 0 refills | Status: AC
  Filled 2024-12-27: qty 1, 1d supply, fill #0

## 2024-12-28 ENCOUNTER — Other Ambulatory Visit: Payer: Self-pay

## 2025-01-02 ENCOUNTER — Other Ambulatory Visit: Payer: Self-pay

## 2025-01-10 ENCOUNTER — Ambulatory Visit: Admitting: Obstetrics and Gynecology

## 2025-01-17 ENCOUNTER — Encounter: Admitting: Physical Medicine and Rehabilitation

## 2025-02-13 ENCOUNTER — Ambulatory Visit: Admitting: Gynecologic Oncology

## 2025-06-19 ENCOUNTER — Inpatient Hospital Stay: Admitting: Hematology and Oncology

## 2025-06-19 ENCOUNTER — Ambulatory Visit: Admitting: Gynecologic Oncology

## 2025-09-03 ENCOUNTER — Ambulatory Visit: Admitting: Radiation Oncology

## 2025-12-11 ENCOUNTER — Ambulatory Visit: Admitting: Dermatology
